# Patient Record
Sex: Male | Born: 1950 | Race: Black or African American | Hispanic: No | Marital: Married | Smoking: Former smoker
Health system: Southern US, Community
[De-identification: ages and names within clinical notes are randomized; demographics above are authoritative.]

## PROBLEM LIST (undated history)

## (undated) DIAGNOSIS — I10 Essential (primary) hypertension: Secondary | ICD-10-CM

## (undated) DIAGNOSIS — C801 Malignant (primary) neoplasm, unspecified: Secondary | ICD-10-CM

## (undated) DIAGNOSIS — I509 Heart failure, unspecified: Secondary | ICD-10-CM

## (undated) DIAGNOSIS — E119 Type 2 diabetes mellitus without complications: Secondary | ICD-10-CM

## (undated) DIAGNOSIS — G4733 Obstructive sleep apnea (adult) (pediatric): Secondary | ICD-10-CM

## (undated) DIAGNOSIS — Z9989 Dependence on other enabling machines and devices: Secondary | ICD-10-CM

---

## 1999-07-17 ENCOUNTER — Ambulatory Visit (HOSPITAL_BASED_OUTPATIENT_CLINIC_OR_DEPARTMENT_OTHER): Admission: RE | Admit: 1999-07-17 | Discharge: 1999-07-17 | Payer: Self-pay

## 1999-11-02 ENCOUNTER — Ambulatory Visit (HOSPITAL_BASED_OUTPATIENT_CLINIC_OR_DEPARTMENT_OTHER): Admission: RE | Admit: 1999-11-02 | Discharge: 1999-11-02 | Payer: Self-pay | Admitting: *Deleted

## 2005-11-12 ENCOUNTER — Ambulatory Visit (HOSPITAL_COMMUNITY): Admission: RE | Admit: 2005-11-12 | Discharge: 2005-11-12 | Payer: Self-pay | Admitting: *Deleted

## 2014-01-23 ENCOUNTER — Other Ambulatory Visit: Payer: Self-pay | Admitting: Internal Medicine

## 2014-01-23 DIAGNOSIS — Z139 Encounter for screening, unspecified: Secondary | ICD-10-CM

## 2014-02-01 ENCOUNTER — Other Ambulatory Visit: Payer: Self-pay | Admitting: Internal Medicine

## 2014-02-01 DIAGNOSIS — R0989 Other specified symptoms and signs involving the circulatory and respiratory systems: Secondary | ICD-10-CM

## 2014-02-01 DIAGNOSIS — Z139 Encounter for screening, unspecified: Secondary | ICD-10-CM

## 2019-03-19 DIAGNOSIS — G4733 Obstructive sleep apnea (adult) (pediatric): Secondary | ICD-10-CM | POA: Diagnosis not present

## 2021-01-26 DIAGNOSIS — Z86010 Personal history of colonic polyps: Secondary | ICD-10-CM | POA: Diagnosis not present

## 2021-01-26 DIAGNOSIS — R634 Abnormal weight loss: Secondary | ICD-10-CM | POA: Diagnosis not present

## 2021-01-26 DIAGNOSIS — K5900 Constipation, unspecified: Secondary | ICD-10-CM | POA: Diagnosis not present

## 2021-02-04 DIAGNOSIS — R634 Abnormal weight loss: Secondary | ICD-10-CM | POA: Diagnosis not present

## 2021-02-04 DIAGNOSIS — R058 Other specified cough: Secondary | ICD-10-CM | POA: Diagnosis not present

## 2021-02-04 DIAGNOSIS — G4733 Obstructive sleep apnea (adult) (pediatric): Secondary | ICD-10-CM | POA: Diagnosis not present

## 2021-02-11 DIAGNOSIS — G4733 Obstructive sleep apnea (adult) (pediatric): Secondary | ICD-10-CM | POA: Diagnosis not present

## 2021-02-11 NOTE — Progress Notes (Unsigned)
° ° °  Patient referred by [name], DO for ***  Subjective:   Bryce Cain, male    DOB: 03/13/1950, 71 y.o.   MRN: [id number]  *** No chief complaint on file.   *** HPI  71 y.o. *** male with ***  *** No past medical history on file.  *** *** The histories are not reviewed yet. Please review them in the "History" navigator section and refresh this SmartLink.  *** Social History   Tobacco Use  Smoking Status Not on file  Smokeless Tobacco Not on file    Social History   Substance and Sexual Activity  Alcohol Use Not on file    *** No family history on file.  *** No current outpatient medications on file prior to visit.   No current facility-administered medications on file prior to visit.    Cardiovascular and other pertinent studies:  *** EKG ***/***/202***: ***  ***  *** Recent labs: 09/10/2020: Glucose 92, BUN/Cr 22/1.57. EGFR 50. Na/K 139/3.9. ***Rest of the CMP normal H/H 13.2/36.3. MCV 78.1. Platelets 162 ***HbA1C 5.7% Chol 153, TG 58, HDL 54, LDL 87 ***TSH ***normal   *** ROS      *** There were no vitals filed for this visit.   There is no height or weight on file to calculate BMI. There were no vitals filed for this visit.  *** Objective:   Physical Exam    ***     Assessment & Recommendations:   ***  ***  Thank you for referring the patient to [place]. Please feel free to contact with any questions.   Nigel Mormon, MD Pager: [phone] Office: [phone]

## 2021-02-14 ENCOUNTER — Emergency Department (HOSPITAL_COMMUNITY): Payer: Medicare PPO

## 2021-02-14 ENCOUNTER — Encounter (HOSPITAL_COMMUNITY): Payer: Self-pay | Admitting: Cardiology

## 2021-02-14 ENCOUNTER — Inpatient Hospital Stay (HOSPITAL_COMMUNITY)
Admission: EM | Admit: 2021-02-14 | Discharge: 2021-02-19 | DRG: 286 | Disposition: A | Discharge status: Home / Self Care | Payer: Medicare PPO | Attending: Cardiology | Admitting: Cardiology

## 2021-02-14 ENCOUNTER — Other Ambulatory Visit: Payer: Self-pay

## 2021-02-14 DIAGNOSIS — I472 Ventricular tachycardia, unspecified: Secondary | ICD-10-CM | POA: Diagnosis not present

## 2021-02-14 DIAGNOSIS — D696 Thrombocytopenia, unspecified: Secondary | ICD-10-CM | POA: Diagnosis present

## 2021-02-14 DIAGNOSIS — E876 Hypokalemia: Secondary | ICD-10-CM | POA: Diagnosis present

## 2021-02-14 DIAGNOSIS — D509 Iron deficiency anemia, unspecified: Secondary | ICD-10-CM | POA: Diagnosis present

## 2021-02-14 DIAGNOSIS — Z79899 Other long term (current) drug therapy: Secondary | ICD-10-CM | POA: Diagnosis not present

## 2021-02-14 DIAGNOSIS — Z20822 Contact with and (suspected) exposure to covid-19: Secondary | ICD-10-CM | POA: Diagnosis present

## 2021-02-14 DIAGNOSIS — Z8249 Family history of ischemic heart disease and other diseases of the circulatory system: Secondary | ICD-10-CM | POA: Diagnosis not present

## 2021-02-14 DIAGNOSIS — I110 Hypertensive heart disease with heart failure: Secondary | ICD-10-CM | POA: Diagnosis not present

## 2021-02-14 DIAGNOSIS — I5043 Acute on chronic combined systolic (congestive) and diastolic (congestive) heart failure: Secondary | ICD-10-CM | POA: Diagnosis not present

## 2021-02-14 DIAGNOSIS — R0600 Dyspnea, unspecified: Secondary | ICD-10-CM | POA: Diagnosis not present

## 2021-02-14 DIAGNOSIS — I130 Hypertensive heart and chronic kidney disease with heart failure and stage 1 through stage 4 chronic kidney disease, or unspecified chronic kidney disease: Principal | ICD-10-CM | POA: Diagnosis present

## 2021-02-14 DIAGNOSIS — I252 Old myocardial infarction: Secondary | ICD-10-CM | POA: Diagnosis not present

## 2021-02-14 DIAGNOSIS — I5021 Acute systolic (congestive) heart failure: Secondary | ICD-10-CM | POA: Diagnosis present

## 2021-02-14 DIAGNOSIS — E1122 Type 2 diabetes mellitus with diabetic chronic kidney disease: Secondary | ICD-10-CM | POA: Diagnosis present

## 2021-02-14 DIAGNOSIS — N1831 Chronic kidney disease, stage 3a: Secondary | ICD-10-CM | POA: Diagnosis not present

## 2021-02-14 DIAGNOSIS — Z7984 Long term (current) use of oral hypoglycemic drugs: Secondary | ICD-10-CM | POA: Diagnosis not present

## 2021-02-14 DIAGNOSIS — E785 Hyperlipidemia, unspecified: Secondary | ICD-10-CM | POA: Diagnosis not present

## 2021-02-14 DIAGNOSIS — R110 Nausea: Secondary | ICD-10-CM | POA: Diagnosis not present

## 2021-02-14 DIAGNOSIS — I10 Essential (primary) hypertension: Secondary | ICD-10-CM | POA: Diagnosis not present

## 2021-02-14 DIAGNOSIS — E669 Obesity, unspecified: Secondary | ICD-10-CM | POA: Diagnosis present

## 2021-02-14 DIAGNOSIS — J811 Chronic pulmonary edema: Secondary | ICD-10-CM | POA: Diagnosis not present

## 2021-02-14 DIAGNOSIS — D649 Anemia, unspecified: Secondary | ICD-10-CM | POA: Diagnosis not present

## 2021-02-14 DIAGNOSIS — I420 Dilated cardiomyopathy: Secondary | ICD-10-CM | POA: Diagnosis not present

## 2021-02-14 DIAGNOSIS — I447 Left bundle-branch block, unspecified: Secondary | ICD-10-CM | POA: Diagnosis not present

## 2021-02-14 DIAGNOSIS — Z7982 Long term (current) use of aspirin: Secondary | ICD-10-CM | POA: Diagnosis not present

## 2021-02-14 DIAGNOSIS — I428 Other cardiomyopathies: Secondary | ICD-10-CM | POA: Diagnosis not present

## 2021-02-14 DIAGNOSIS — J9811 Atelectasis: Secondary | ICD-10-CM | POA: Diagnosis not present

## 2021-02-14 DIAGNOSIS — I2510 Atherosclerotic heart disease of native coronary artery without angina pectoris: Secondary | ICD-10-CM | POA: Diagnosis present

## 2021-02-14 DIAGNOSIS — J90 Pleural effusion, not elsewhere classified: Secondary | ICD-10-CM | POA: Diagnosis not present

## 2021-02-14 DIAGNOSIS — G4733 Obstructive sleep apnea (adult) (pediatric): Secondary | ICD-10-CM | POA: Diagnosis present

## 2021-02-14 DIAGNOSIS — I509 Heart failure, unspecified: Secondary | ICD-10-CM | POA: Diagnosis not present

## 2021-02-14 DIAGNOSIS — Z6834 Body mass index (BMI) 34.0-34.9, adult: Secondary | ICD-10-CM | POA: Diagnosis not present

## 2021-02-14 DIAGNOSIS — I5031 Acute diastolic (congestive) heart failure: Secondary | ICD-10-CM | POA: Diagnosis not present

## 2021-02-14 DIAGNOSIS — I5023 Acute on chronic systolic (congestive) heart failure: Secondary | ICD-10-CM | POA: Diagnosis not present

## 2021-02-14 HISTORY — DX: Type 2 diabetes mellitus without complications: E11.9

## 2021-02-14 HISTORY — DX: Obstructive sleep apnea (adult) (pediatric): Z99.89

## 2021-02-14 HISTORY — DX: Obstructive sleep apnea (adult) (pediatric): G47.33

## 2021-02-14 LAB — BASIC METABOLIC PANEL
Anion gap: 13 (ref 5–15)
BUN: 17 mg/dL (ref 8–23)
CO2: 22 mmol/L (ref 22–32)
Calcium: 10.1 mg/dL (ref 8.9–10.3)
Chloride: 105 mmol/L (ref 98–111)
Creatinine, Ser: 1.49 mg/dL — ABNORMAL HIGH (ref 0.61–1.24)
GFR, Estimated: 50 mL/min — ABNORMAL LOW (ref >60)
Glucose, Bld: 188 mg/dL — ABNORMAL HIGH (ref 70–99)
Potassium: 3.8 mmol/L (ref 3.5–5.1)
Sodium: 140 mmol/L (ref 135–145)

## 2021-02-14 LAB — RESP PANEL BY RT-PCR (FLU A&B, COVID) ARPGX2
Influenza A by PCR: NEGATIVE
Influenza B by PCR: NEGATIVE
SARS Coronavirus 2 by RT PCR: NEGATIVE

## 2021-02-14 LAB — CBC
HCT: 37.1 % — ABNORMAL LOW (ref 39.0–52.0)
Hemoglobin: 13.1 g/dL (ref 13.0–17.0)
MCH: 28.4 pg (ref 26.0–34.0)
MCHC: 35.3 g/dL (ref 30.0–36.0)
MCV: 80.5 fL (ref 80.0–100.0)
Platelets: 155 10*3/uL (ref 150–400)
RBC: 4.61 MIL/uL (ref 4.22–5.81)
RDW: 15.9 % — ABNORMAL HIGH (ref 11.5–15.5)
WBC: 7.5 10*3/uL (ref 4.0–10.5)
nRBC: 0 % (ref 0.0–0.2)

## 2021-02-14 LAB — PROTIME-INR
INR: 1.3 — ABNORMAL HIGH (ref 0.8–1.2)
Prothrombin Time: 15.9 seconds — ABNORMAL HIGH (ref 11.4–15.2)

## 2021-02-14 LAB — TROPONIN I (HIGH SENSITIVITY)
Troponin I (High Sensitivity): 36 ng/L — ABNORMAL HIGH (ref <18)
Troponin I (High Sensitivity): 121 ng/L (ref <18)

## 2021-02-14 LAB — TSH: TSH: 0.662 u[IU]/mL (ref 0.350–4.500)

## 2021-02-14 LAB — HEMOGLOBIN A1C
Hgb A1c MFr Bld: 5.4 % (ref 4.8–5.6)
Mean Plasma Glucose: 108.28 mg/dL

## 2021-02-14 LAB — GLUCOSE, CAPILLARY: Glucose-Capillary: 89 mg/dL (ref 70–99)

## 2021-02-14 LAB — MAGNESIUM
Magnesium: 1.7 mg/dL (ref 1.7–2.4)
Magnesium: 1.6 mg/dL — ABNORMAL LOW (ref 1.7–2.4)

## 2021-02-14 LAB — BRAIN NATRIURETIC PEPTIDE: B Natriuretic Peptide: 707 pg/mL — ABNORMAL HIGH (ref 0.0–100.0)

## 2021-02-14 LAB — T4, FREE: Free T4: 1.15 ng/dL — ABNORMAL HIGH (ref 0.61–1.12)

## 2021-02-14 IMAGING — DX DG CHEST 1V PORT
1 series · 1 of 1 positions shown · non-contrast
Comparison: None.

CLINICAL DATA: Dyspnea.

EXAM:
PORTABLE CHEST 1 VIEW

[chest ap]
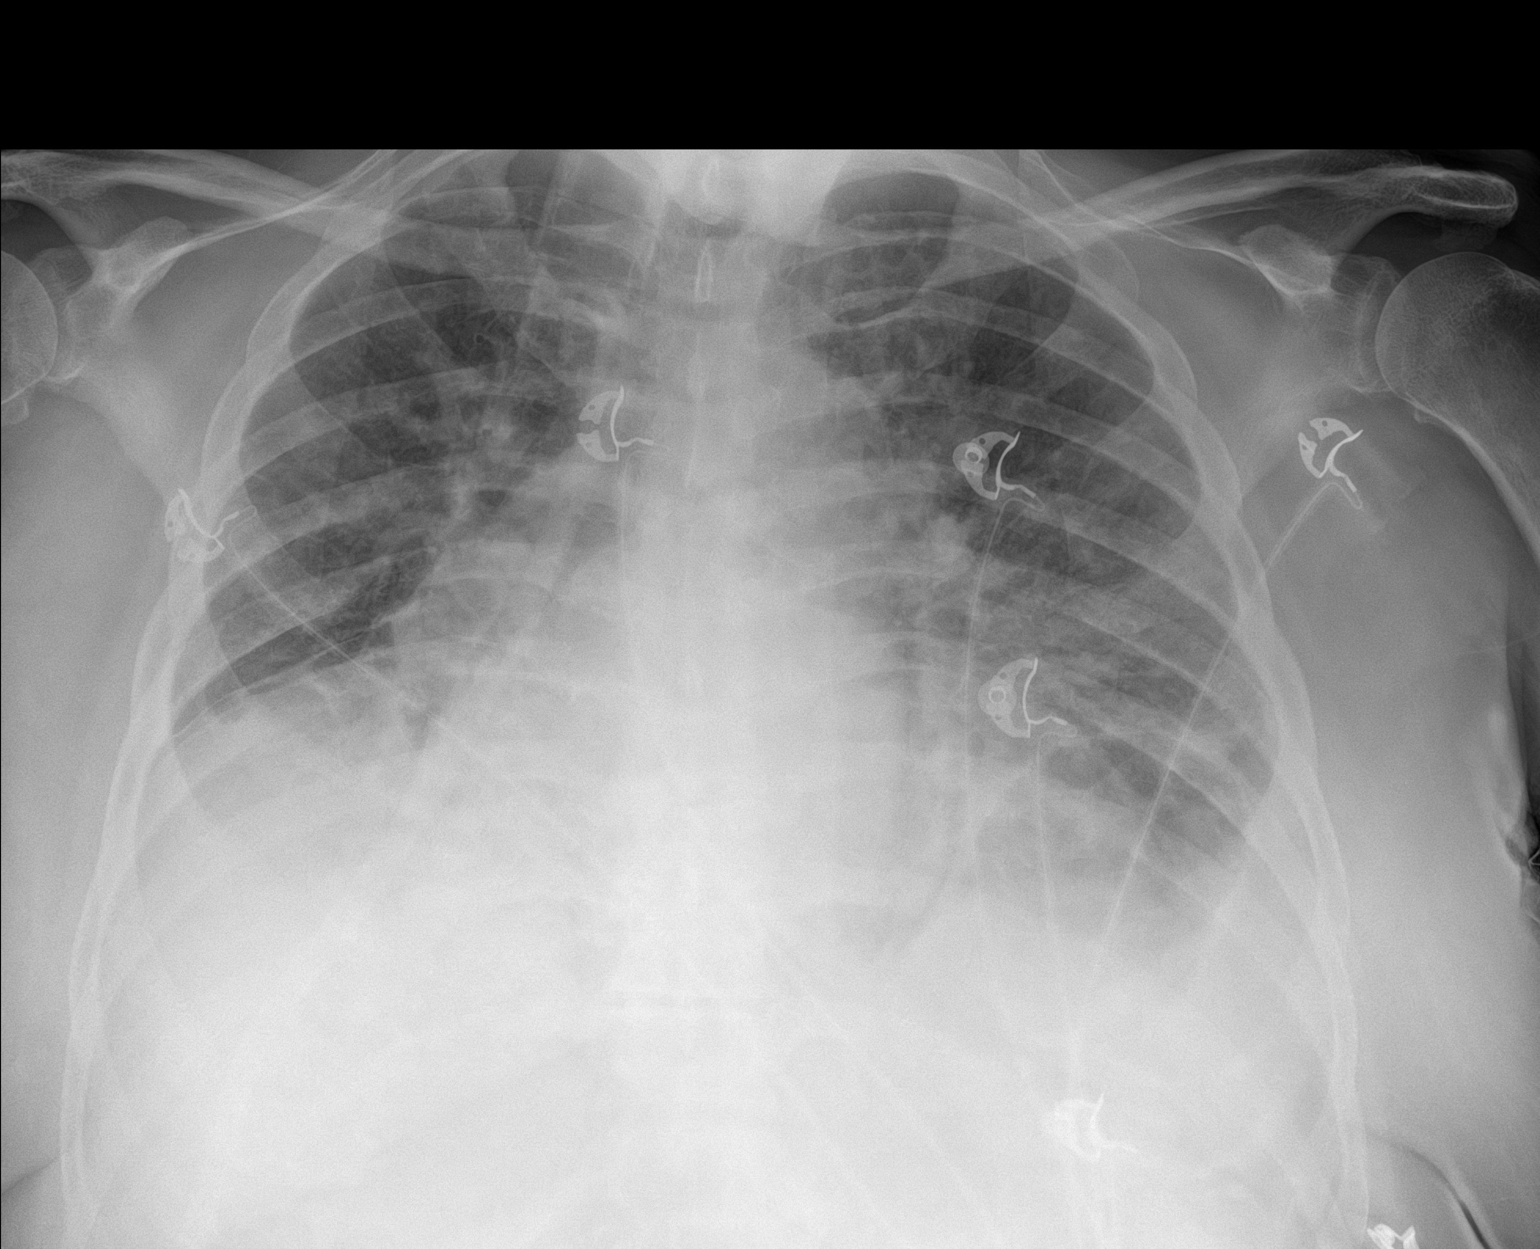

[1 of 1 positions shown; findings below may reference images not displayed]

FINDINGS: Normal cardiomediastinal contours. Lung volumes are low. Bilateral
pleural effusions are identified. There is diffuse pulmonary
vascular congestion. Atelectasis noted in the left base.
IMPRESSION: 1. Low lung volumes with mild CHF.
2. Left base atelectasis.

## 2021-02-14 MED ORDER — ASPIRIN 300 MG RE SUPP
300.0000 mg | RECTAL | Status: AC
  Filled 2021-02-14: qty 1

## 2021-02-14 MED ORDER — ASPIRIN EC 81 MG PO TBEC: 81.0000 mg | DELAYED_RELEASE_TABLET | Freq: Every day | ORAL | Status: DC

## 2021-02-14 MED ORDER — NITROGLYCERIN IN D5W 200-5 MCG/ML-% IV SOLN
5.0000 ug/min | INTRAVENOUS | Status: DC
  Administered 2021-02-14: 10 ug/min via INTRAVENOUS
  Filled 2021-02-14: qty 250

## 2021-02-14 MED ORDER — RISAQUAD PO CAPS
1.0000 | ORAL_CAPSULE | Freq: Every day | ORAL | Status: DC
Start: 2021-02-14 — End: 2021-02-20
  Administered 2021-02-14 – 2021-02-19 (×6): 1 via ORAL
  Filled 2021-02-14 (×6): qty 1

## 2021-02-14 MED ORDER — INSULIN ASPART 100 UNIT/ML IJ SOLN: 0.0000 [IU] | Freq: Every day | INTRAMUSCULAR | Status: DC

## 2021-02-14 MED ORDER — FUROSEMIDE 10 MG/ML IJ SOLN
40.0000 mg | Freq: Two times a day (BID) | INTRAMUSCULAR | Status: AC
  Administered 2021-02-14 – 2021-02-15 (×3): 40 mg via INTRAVENOUS
  Filled 2021-02-14 (×3): qty 4

## 2021-02-14 MED ORDER — NITROGLYCERIN 0.4 MG SL SUBL: 0.4000 mg | SUBLINGUAL_TABLET | SUBLINGUAL | Status: DC | PRN

## 2021-02-14 MED ORDER — NITROGLYCERIN IN D5W 200-5 MCG/ML-% IV SOLN: 0.0000 ug/min | INTRAVENOUS | Status: DC

## 2021-02-14 MED ORDER — ACETAMINOPHEN 325 MG PO TABS: 650.0000 mg | ORAL_TABLET | ORAL | Status: DC | PRN

## 2021-02-14 MED ORDER — ASPIRIN 81 MG PO CHEW
324.0000 mg | CHEWABLE_TABLET | ORAL | Status: AC
  Administered 2021-02-14: 324 mg via ORAL
  Filled 2021-02-14: qty 4

## 2021-02-14 MED ORDER — HEPARIN (PORCINE) 25000 UT/250ML-% IV SOLN
1250.0000 [IU]/h | INTRAVENOUS | Status: DC
  Administered 2021-02-14 – 2021-02-15 (×2): 1200 [IU]/h via INTRAVENOUS
  Administered 2021-02-16: 1250 [IU]/h via INTRAVENOUS
  Filled 2021-02-14 (×3): qty 250

## 2021-02-14 MED ORDER — POTASSIUM CHLORIDE CRYS ER 20 MEQ PO TBCR
20.0000 meq | EXTENDED_RELEASE_TABLET | Freq: Once | ORAL | Status: AC
  Administered 2021-02-14: 20 meq via ORAL
  Filled 2021-02-14: qty 1

## 2021-02-14 MED ORDER — PRAVASTATIN SODIUM 10 MG PO TABS
10.0000 mg | ORAL_TABLET | Freq: Every day | ORAL | Status: DC
  Administered 2021-02-14 – 2021-02-15 (×2): 10 mg via ORAL
  Filled 2021-02-14 (×2): qty 1

## 2021-02-14 MED ORDER — OMEGA-3-ACID ETHYL ESTERS 1 G PO CAPS
1.0000 g | ORAL_CAPSULE | Freq: Two times a day (BID) | ORAL | Status: DC
  Administered 2021-02-14 – 2021-02-19 (×10): 1 g via ORAL
  Filled 2021-02-14 (×10): qty 1

## 2021-02-14 MED ORDER — ASPIRIN EC 81 MG PO TBEC
81.0000 mg | DELAYED_RELEASE_TABLET | Freq: Every day | ORAL | Status: DC
  Administered 2021-02-15 – 2021-02-19 (×4): 81 mg via ORAL
  Filled 2021-02-14 (×4): qty 1

## 2021-02-14 MED ORDER — ADULT MULTIVITAMIN W/MINERALS CH
1.0000 | ORAL_TABLET | Freq: Every day | ORAL | Status: DC
  Administered 2021-02-14 – 2021-02-19 (×6): 1 via ORAL
  Filled 2021-02-14 (×6): qty 1

## 2021-02-14 MED ORDER — DOCUSATE SODIUM 100 MG PO CAPS
100.0000 mg | ORAL_CAPSULE | Freq: Two times a day (BID) | ORAL | Status: DC
  Administered 2021-02-15 – 2021-02-18 (×3): 100 mg via ORAL
  Filled 2021-02-14 (×10): qty 1

## 2021-02-14 MED ORDER — HEPARIN BOLUS VIA INFUSION
4000.0000 [IU] | Freq: Once | INTRAVENOUS | Status: AC
  Administered 2021-02-14: 4000 [IU] via INTRAVENOUS
  Filled 2021-02-14: qty 4000

## 2021-02-14 MED ORDER — FUROSEMIDE 10 MG/ML IJ SOLN
40.0000 mg | Freq: Once | INTRAMUSCULAR | Status: AC
  Administered 2021-02-14: 40 mg via INTRAVENOUS
  Filled 2021-02-14: qty 4

## 2021-02-14 MED ORDER — INSULIN ASPART 100 UNIT/ML IJ SOLN
0.0000 [IU] | Freq: Three times a day (TID) | INTRAMUSCULAR | Status: DC
  Administered 2021-02-17: 1 [IU] via SUBCUTANEOUS

## 2021-02-14 MED ORDER — ONDANSETRON HCL 4 MG/2ML IJ SOLN: 4.0000 mg | Freq: Four times a day (QID) | INTRAMUSCULAR | Status: DC | PRN

## 2021-02-14 NOTE — ED Provider Notes (Signed)
[facility] EMERGENCY DEPARTMENT Provider Note   CSN: [id number] Arrival date & time: 02/14/21  0900     History  Chief Complaint  Patient presents with   Shortness of Breath    Bryce Cain is a 71 y.o. male.   Shortness of Breath Associated symptoms: no chest pain    Patient presents to the ED for evaluation of shortness of breath.  Patient has been having some issues recently with increasing shortness of breath.  He has seen his doctors and is getting referrals to pulmonary and cardiology doctors.  Patient does have a history of obstructive sleep apnea and wears CPAP.  Patient began having worsening symptoms.  Symptoms are severe and his breathing is very labored.  Patient feels worse when he is lying flat and wants to stand up.  Family stated he looked like he was going to faint earlier but he has not had any syncopal episodes.  Is not having chest pain.  No fevers.  Home Medications Prior to Admission medications   Not on File      Allergies    Patient has no allergy information on record.    Review of Systems   Review of Systems  Respiratory:  Positive for shortness of breath.   Cardiovascular:  Negative for chest pain.   Physical Exam Updated Vital Signs BP 90/65    Pulse 79    Temp 98.2 F (36.8 C) (Axillary)    Resp 17    Ht 1.778 m (5\' 10" )    Wt 107.5 kg    SpO2 100%    BMI 34.01 kg/m  Physical Exam Vitals and nursing note reviewed.  Constitutional:      General: He is in acute distress.     Appearance: He is well-developed. He is ill-appearing.  HENT:     Head: Normocephalic and atraumatic.     Right Ear: External ear normal.     Left Ear: External ear normal.  Eyes:     General: No scleral icterus.       Right eye: No discharge.        Left eye: No discharge.     Conjunctiva/sclera: Conjunctivae normal.  Neck:     Trachea: No tracheal deviation.  Cardiovascular:     Rate and Rhythm: Normal rate and regular rhythm.  Pulmonary:      Effort: Tachypnea and accessory muscle usage present. No respiratory distress.     Breath sounds: No stridor. Rales present. No wheezing.  Abdominal:     General: Bowel sounds are normal. There is no distension.     Palpations: Abdomen is soft.     Tenderness: There is no abdominal tenderness. There is no guarding or rebound.  Musculoskeletal:        General: No tenderness or deformity.     Cervical back: Neck supple.     Right lower leg: No edema.     Left lower leg: No edema.  Skin:    General: Skin is warm and dry.     Findings: No rash.  Neurological:     General: No focal deficit present.     Mental Status: He is alert.     Cranial Nerves: No cranial nerve deficit (no facial droop, extraocular movements intact, no slurred speech).     Sensory: No sensory deficit.     Motor: No abnormal muscle tone or seizure activity.     Coordination: Coordination normal.  Psychiatric:  Mood and Affect: Mood normal.    ED Results / Procedures / Treatments   Labs (all labs ordered are listed, but only abnormal results are displayed) Labs Reviewed  BASIC METABOLIC PANEL - Abnormal; Notable for the following components:      Result Value   Glucose, Bld 188 (*)    Creatinine, Ser 1.49 (*)    GFR, Estimated 50 (*)    All other components within normal limits  BRAIN NATRIURETIC PEPTIDE - Abnormal; Notable for the following components:   B Natriuretic Peptide 707.0 (*)    All other components within normal limits  CBC - Abnormal; Notable for the following components:   HCT 37.1 (*)    RDW 15.9 (*)    All other components within normal limits  TROPONIN I (HIGH SENSITIVITY) - Abnormal; Notable for the following components:   Troponin I (High Sensitivity) 36 (*)    All other components within normal limits  RESP PANEL BY RT-PCR (FLU A&B, COVID) ARPGX2  MAGNESIUM  TROPONIN I (HIGH SENSITIVITY)    EKG EKG Interpretation  Date/Time:  Saturday February 14 2021 09:30:45  EST Ventricular Rate:  129 PR Interval:  64 QRS Duration: 181 QT Interval:  424 QTC Calculation: 622 R Axis:   19 Text Interpretation: Sinus or ectopic atrial tachycardia IVCD, consider atypical LBBB No previous tracing Confirmed by Dorie Rank [id number] on 02/14/2021 9:49:14 AM  Radiology DG Chest Portable 1 View  Result Date: 02/14/2021 CLINICAL DATA:  Dyspnea. EXAM: PORTABLE CHEST 1 VIEW COMPARISON:  None. FINDINGS: Normal cardiomediastinal contours. Lung volumes are low. Bilateral pleural effusions are identified. There is diffuse pulmonary vascular congestion. Atelectasis noted in the left base. IMPRESSION: 1. Low lung volumes with mild CHF. 2. Left base atelectasis. Electronically Signed   By: Kerby Moors M.D.   On: 02/14/2021 10:51    Procedures .Critical Care Performed by: Dorie Rank, MD Authorized by: Dorie Rank, MD   Critical care provider statement:    Critical care time (minutes):  35   Critical care was time spent personally by me on the following activities:  Development of treatment plan with patient or surrogate, discussions with consultants, evaluation of patient's response to treatment, examination of patient, ordering and review of laboratory studies, ordering and review of radiographic studies, ordering and performing treatments and interventions, pulse oximetry, re-evaluation of patient's condition and review of old charts    Medications Ordered in ED Medications  nitroGLYCERIN 50 mg in dextrose 5 % 250 mL (0.2 mg/mL) infusion (10 mcg/min Intravenous New Bag/Given 02/14/21 0928)  furosemide (LASIX) injection 40 mg (has no administration in time range)  potassium chloride SA (KLOR-CON M) CR tablet 20 mEq (has no administration in time range)    ED Course/ Medical Decision Making/ A&P Clinical Course as of 02/14/21 1151  Sat Feb 14, 2021  0933 Patient started on BiPAP and now is feeling significantly better.  Work of breathing decreased.  Patient does not appear  distressed at this time [JK]  1000 DG Chest Portable 1 View Preliminary review of chest x-ray shows cardiomegaly and bilateral pulmonary edema [JK]  0071 Basic metabolic panel(!) Renal function shows slight increase in creatinine at 1.49 [JK]  1013 Troponin I (High Sensitivity)(!) Troponin slightly elevated at 36 [JK]  1110 Chest x-ray shows evidence of pulmonary edema [JK]  1110 Brain natriuretic peptide (order ONLY if patient c/o SOB)(!) BNP elevated [JK]  1113 Continued improvement in breathing.  Still on bipap but may be able to taper off  as he responds to diuresis [JK]  1142 Case discussed with Dr. Johney Frame.  She agrees with admission.  Cardiology will see [JK]    Clinical Course User Index [JK] Dorie Rank, MD                           Medical Decision Making Amount and/or Complexity of Data Reviewed Independent Historian: caregiver Labs: ordered. Decision-making details documented in ED Course. Radiology: ordered. Decision-making details documented in ED Course.  Risk Prescription drug management. Drug therapy requiring intensive monitoring for toxicity. Decision regarding hospitalization.   Patient presented to the ED for evaluation of acute shortness of breath.  On arrival patient appeared to be in significant distress.  He was not hypoxic but he was tachypneic and had increased work of breathing.,  Pneumonia, PE, CHF exacerbation all a significant concern.  ED work-up does not show any signs of leukocytosis.  He is not febrile.  COVID and flu are negative.  Patient's laboratory test did show an elevated troponin as well as a BNP.  Chest x-ray is suggestive of an acute CHF exacerbation.  Patient was treated with a BiPAP IV nitroglycerin and Lasix.  He has had significant improvement of his symptoms.  Patient however will require admission to the hospital for further treatment evaluation.  I will consult with the cardiology service        Final Clinical Impression(s) / ED  Diagnoses Final diagnoses:  Acute on chronic congestive heart failure, unspecified heart failure type [facility])     Dorie Rank, MD 02/14/21 1152

## 2021-02-14 NOTE — ED Notes (Signed)
Pt weaned from bi-pap down to 2L [place].  Tolerating well.

## 2021-02-14 NOTE — Progress Notes (Signed)
Pt placed on CPAP machine and setting made for pt's comfort. Pt tolerating well without any complaints.

## 2021-02-14 NOTE — Plan of Care (Signed)
  Problem: Health Behavior/Discharge Planning: Goal: Ability to manage health-related needs will improve Outcome: Progressing   Problem: Education: Goal: Knowledge of General Education information will improve Description: Including pain rating scale, medication(s)/side effects and non-pharmacologic comfort measures Outcome: Progressing   

## 2021-02-14 NOTE — ED Triage Notes (Signed)
Pt c/o SOB starting at 0200 this AM, endorses relief with rest, worse with exertion. Pt states he was resting in bed when SOB started, unable to sleep due to SOB. Pt wears CPAP at night, was wearing CPAP when SOB began. Denies CP.

## 2021-02-14 NOTE — H&P (Addendum)
Cardiology History and Physical   Patient ID: Bryce Cain MRN: [id number]; DOB: Sep 04, 1950  Admit date: 02/14/2021 Date of Consult: 02/14/2021  PCP:  [facility] [place] Prov[facility] Cardiologist:  None       CC: SOB Patient Profile:   Bryce Cain is a 71 y.o. male with a hx of OSA on cpap now admitted with SOB who is being seen 02/14/2021 for the evaluation of elevated troponin and acute CHF at the request of Dr. Tomi Bamberger.  History of Present Illness:   Mr. Scheff with prior hx of OSA and CPAP and recently with increased SOB, PCP was referring to cards and pulmonary.  He saw his sleep apnea MD and his device is not working well but pt has had on going SOB.  Over last 3 weeks until unable to lie flat.  No lower ext edema + bloating and fullness in abd.  No chest pain.  He can walk around house without dyspnea but if walks outside home then DOE.     Pt's brother is oncologist and has helped him get script for lasix but pt has not yet started.  Pt's breathing had deteriorated and he came to ER , could not lie flat.  No chest pain.   Na 140, K+ 3.8, glucose 188 BUN 17, Cr 1.49  BNP 707 hs troponin 36  WBC 7.5 Hgb 13.1 plts155 Covid neg   Pt placed on Bipap  PCXR  FINDINGS: Normal cardiomediastinal contours. Lung volumes are low. Bilateral pleural effusions are identified. There is diffuse pulmonary vascular congestion. Atelectasis noted in the left base.   IMPRESSION: 1. Low lung volumes with mild CHF. 2. Left base atelectasis.  EKG:  The EKG was personally reviewed and demonstrates:  atrial tach with LBBB vs ST  Telemetry:  Telemetry was personally reviewed and demonstrates:  ST  BP 664/40 to 98 systolic  placed on NTG which helped and has lasix ordered but not given.  Past Medical History:  Diagnosis Date   DM (diabetes mellitus), type 2 ([place])    OSA on CPAP     History reviewed. No pertinent surgical history.   Home Medications:  Prior to  Admission medications   Medication Sig Start Date End Date Taking? Authorizing Provider  acidophilus (RISAQUAD) CAPS capsule Take 1 capsule by mouth daily.   Yes [provider]  amLODipine (NORVASC) 10 MG tablet Take 10 mg by mouth daily. 01/14/21  Yes [provider]  aspirin EC 81 MG tablet Take 81 mg by mouth daily. Swallow whole.   Yes [provider]  docusate sodium (COLACE) 100 MG capsule Take 100 mg by mouth 2 (two) times daily.   Yes [provider]  JANUMET 50-500 MG tablet Take 1 tablet by mouth 2 (two) times daily. 01/15/21  Yes [provider]  Multiple Vitamins-Minerals (MULTIVITAMIN WITH MINERALS) tablet Take 1 tablet by mouth daily.   Yes [provider]  Omega-3 Fatty Acids (FISH OIL) 1000 MG CAPS Take 1,000 mg by mouth daily.   Yes [provider]  pravastatin (PRAVACHOL) 10 MG tablet Take 10 mg by mouth daily. 01/14/21  Yes [provider]  valsartan-hydrochlorothiazide (DIOVAN-HCT) 320-12.5 MG tablet Take 1 tablet by mouth daily. 01/13/21  Yes [provider]    Inpatient Medications: Scheduled Meds:  furosemide  40 mg Intravenous Once   potassium chloride  20 mEq Oral Once   Continuous Infusions:  nitroGLYCERIN 10 mcg/min (02/14/21 0928)   PRN  Meds:   Allergies:   No Known Allergies  Social History:   Social History   Socioeconomic History   Marital status: Married    Spouse name: Not on file   Number of children: Not on file   Years of education: Not on file   Highest education level: Not on file  Occupational History   Not on file  Tobacco Use   Smoking status: Not on file   Smokeless tobacco: Not on file  Substance and Sexual Activity   Alcohol use: Not on file   Drug use: Not on file   Sexual activity: Not on file  Other Topics Concern   Not on file  Social History Narrative   Not on file   Social Determinants of Health   Financial Resource Strain: Not on file   Food Insecurity: Not on file  Transportation Needs: Not on file  Physical Activity: Not on file  Stress: Not on file  Social Connections: Not on file  Intimate Partner Violence: Not on file    Family History:    Family History  Problem Relation Age of Onset   Heart attack Father      ROS:  Please see the history of present illness.  General:no colds or fevers, no weight changes Skin:no rashes or ulcers HEENT:no blurred vision, no congestion CV:see HPI PUL:see HPI GI:no diarrhea constipation or melena, no indigestion GU:no hematuria, no dysuria MS:no joint pain, no claudication Neuro:no syncope, no lightheadedness Endo:+ diabetes, no thyroid disease  All other ROS reviewed and negative.     Physical Exam/Data:   Vitals:   02/14/21 1145 02/14/21 1200 02/14/21 1230 02/14/21 1300  BP: 92/63 100/74    Pulse: 67 81 76 72  Resp: (!) 21 (!) 21 13 13   Temp:      TempSrc:      SpO2: 100% 100% 100% 100%  Weight:      Height:       No intake or output data in the 24 hours ending 02/14/21 1318 Last 3 Weights 02/14/2021  Weight (lbs) 237 lb  Weight (kg) 107.502 kg     Body mass index is 34.01 kg/m.  General:  Well nourished, well developed, wearing BiPap and some dyspnea HEENT: normal Neck: no JVD Vascular: No carotid bruits; Distal pulses 2+ bilaterally Cardiac:  normal S1, S2; RRR; no murmur gallup rub or click Lungs:  rales  to auscultation bilaterally, no wheezing, rhonchi  Abd: soft, nontender, no hepatomegaly  Ext: no edema Musculoskeletal:  No deformities, BUE and BLE strength normal and equal Skin: warm and dry  Neuro:  CNs 2-12 intact, no focal abnormalities noted Psych:  Normal affect    Relevant CV Studies: none  Laboratory Data:  High Sensitivity Troponin:   Recent Labs  Lab 02/14/21 0915  TROPONINIHS 36*     Chemistry Recent Labs  Lab 02/14/21 0915  NA 140  K 3.8  CL 105  CO2 22  GLUCOSE 188*  BUN 17  CREATININE 1.49*  CALCIUM 10.1   MG 1.7  GFRNONAA 50*  ANIONGAP 13    No results for input(s): PROT, ALBUMIN, AST, ALT, ALKPHOS, BILITOT in the last 168 hours. Lipids No results for input(s): CHOL, TRIG, HDL, LABVLDL, LDLCALC, CHOLHDL in the last 168 hours.  Hematology Recent Labs  Lab 02/14/21 0915  WBC 7.5  RBC 4.61  HGB 13.1  HCT 37.1*  MCV 80.5  MCH 28.4  MCHC 35.3  RDW 15.9*  PLT 155   Thyroid  No results for input(s): TSH, FREET4 in the last 168 hours.  BNP Recent Labs  Lab 02/14/21 0915  BNP 707.0*    DDimer No results for input(s): DDIMER in the last 168 hours.   Radiology/Studies:  DG Chest Portable 1 View  Result Date: 02/14/2021 CLINICAL DATA:  Dyspnea. EXAM: PORTABLE CHEST 1 VIEW COMPARISON:  None. FINDINGS: Normal cardiomediastinal contours. Lung volumes are low. Bilateral pleural effusions are identified. There is diffuse pulmonary vascular congestion. Atelectasis noted in the left base. IMPRESSION: 1. Low lung volumes with mild CHF. 2. Left base atelectasis. Electronically Signed   By: Kerby Moors M.D.   On: 02/14/2021 10:51     Assessment and Plan:   Acute CHF began 3 weeks ago, hs troponin 36 but second one not done - yet.  Will repeat.  No angina, plan to diuresis. Echo and keep K+ 4.0 and mg+ >2.0.  IV NTG has helped continue for now.continue ASA DM-2 hold janumet  HTN on diovan HCT hold HCT and with lower BP hold ARB HLD on pravastatin check lipids.    Risk Assessment/Risk Scores:       New York Heart Association (NYHA) Functional Class NYHA Class III     For questions or updates, please contact [facility] Please consult [email or web address] for contact info under    Signed, Cecilie Kicks, NP  02/14/2021 1:18 PM  History and all data above reviewed.  Patient examined.  I agree with the findings as above.  The patient has had about 3 months of increasing shortness of breath.  He has been quietly and during this.  However, he has been getting symptoms more consistent with  PND and orthopnea.  He is been having a nonproductive cough but and also describes some "bubbles to come up".  He has not been having any fevers or chills.  He has not been having any chest pressure, neck or arm discomfort.  He has been told he had a left bundle branch block as he recalls but he has not been found previously to have vascular disease or heart disease.  He recently was given a prescription for Lasix which she did not start on the presumptive thought that he had heart failure.  Chest x-ray by his report apparently demonstrated some edema.  He came in to the hospital today because this morning he was more acutely short of breath and his wife convinced him to come after talking to his brother who is an oncologist.  BNP is mildly elevated.  Troponin was elevated but nondiagnostic.  Chest x-ray had some pulmonary edema.  He feels better with BiPAP and is now diuresing.  The patient exam reveals COR: Regular rate and rhythm,  Lungs: Decreased breath sounds with bilateral crackles,  Abd: Positive bowel sounds normal in frequency and pitch, Ext no edema.  All available labs, radiology testing, previous records reviewed. Agree with documented assessment and plan.  Pulmonary edema: The patient has acute heart failure and we will check an echocardiogram today.  I suspect some LV dysfunction.  We will continue IV diuresis.  He was put on IV nitroglycerin for his pulmonary edema and we can continue low-dose of this.  Meds will be titrated for afterload reduction and goal-directed medical therapy.  He will need a right and left heart catheterization this admission.  We will continue to cycle enzymes.  Jeneen Rinks Helio Lack  3:03 PM  02/14/2021

## 2021-02-14 NOTE — Progress Notes (Signed)
ANTICOAGULATION CONSULT NOTE - Initial Consult  Pharmacy Consult for Heparin Indication: chest pain/ACS  No Known Allergies  Patient Measurements: Height: 5\' 10"  (177.8 cm) Weight: 107.5 kg (237 lb) IBW/kg (Calculated) : 73 Heparin Dosing Weight: 96.1 kg  Vital Signs: Temp: 98.2 F (36.8 C) (02/11 0935) Temp Source: Axillary (02/11 0935) BP: 132/93 (02/11 1430) Pulse Rate: 93 (02/11 1430)  Labs: Recent Labs    02/14/21 0915 02/14/21 1122  HGB 13.1  --   HCT 37.1*  --   PLT 155  --   CREATININE 1.49*  --   TROPONINIHS 36* 99*    Estimated Creatinine Clearance: 55 mL/min (A) (by C-G formula based on SCr of 1.49 mg/dL (H)).   Medical History: Past Medical History:  Diagnosis Date   DM (diabetes mellitus), type 2 ([place])    OSA on CPAP     Medications:  (Not in a hospital admission)  Scheduled:   furosemide  40 mg Intravenous Q12H   potassium chloride  20 mEq Oral Once   Infusions:   nitroGLYCERIN 10 mcg/min (02/14/21 0928)   PRN:   Assessment: 60 yom with a history of OSA and CPAP. Patient is presenting with SOB. Heparin per pharmacy consult placed for chest pain/ACS.  Patient is not on anticoagulation prior to arrival.  Hgb 13.1; plt 155  Goal of Therapy:  Heparin level 0.3-0.7 units/ml Monitor platelets by anticoagulation protocol: Yes   Plan:  Give 4000 units bolus x 1 Start heparin infusion at 1200 units/hr Check anti-Xa level in 8 hours and daily while on heparin Continue to monitor H&H and platelets  Lorelei Pont, PharmD, BCPS 02/14/2021 3:03 PM ED Clinical Pharmacist -  [phone]

## 2021-02-15 ENCOUNTER — Inpatient Hospital Stay (HOSPITAL_COMMUNITY): Payer: Medicare PPO

## 2021-02-15 DIAGNOSIS — N1831 Chronic kidney disease, stage 3a: Secondary | ICD-10-CM | POA: Diagnosis not present

## 2021-02-15 DIAGNOSIS — I5021 Acute systolic (congestive) heart failure: Secondary | ICD-10-CM

## 2021-02-15 DIAGNOSIS — I5031 Acute diastolic (congestive) heart failure: Secondary | ICD-10-CM

## 2021-02-15 DIAGNOSIS — I509 Heart failure, unspecified: Secondary | ICD-10-CM | POA: Diagnosis not present

## 2021-02-15 DIAGNOSIS — D649 Anemia, unspecified: Secondary | ICD-10-CM

## 2021-02-15 LAB — LIPID PANEL
Cholesterol: 137 mg/dL (ref 0–200)
HDL: 55 mg/dL (ref >40)
LDL Cholesterol: 74 mg/dL (ref 0–99)
Total CHOL/HDL Ratio: 2.5 RATIO
Triglycerides: 41 mg/dL (ref <150)
VLDL: 8 mg/dL (ref 0–40)

## 2021-02-15 LAB — GLUCOSE, CAPILLARY
Glucose-Capillary: 106 mg/dL — ABNORMAL HIGH (ref 70–99)
Glucose-Capillary: 135 mg/dL — ABNORMAL HIGH (ref 70–99)
Glucose-Capillary: 121 mg/dL — ABNORMAL HIGH (ref 70–99)

## 2021-02-15 LAB — ECHOCARDIOGRAM COMPLETE
Height: 70 in
P 1/2 time: 341 msec
S' Lateral: 5.9 cm
Single Plane A4C EF: 13.7 %
Weight: 3604.96 oz

## 2021-02-15 LAB — BASIC METABOLIC PANEL
Anion gap: 12 (ref 5–15)
BUN: 17 mg/dL (ref 8–23)
CO2: 25 mmol/L (ref 22–32)
Calcium: 10 mg/dL (ref 8.9–10.3)
Chloride: 103 mmol/L (ref 98–111)
Creatinine, Ser: 1.51 mg/dL — ABNORMAL HIGH (ref 0.61–1.24)
GFR, Estimated: 49 mL/min — ABNORMAL LOW (ref >60)
Glucose, Bld: 97 mg/dL (ref 70–99)
Potassium: 3.6 mmol/L (ref 3.5–5.1)
Sodium: 140 mmol/L (ref 135–145)

## 2021-02-15 LAB — CBC
HCT: 33.5 % — ABNORMAL LOW (ref 39.0–52.0)
Hemoglobin: 12 g/dL — ABNORMAL LOW (ref 13.0–17.0)
MCH: 28.6 pg (ref 26.0–34.0)
MCHC: 35.8 g/dL (ref 30.0–36.0)
MCV: 79.8 fL — ABNORMAL LOW (ref 80.0–100.0)
Platelets: 126 10*3/uL — ABNORMAL LOW (ref 150–400)
RBC: 4.2 MIL/uL — ABNORMAL LOW (ref 4.22–5.81)
RDW: 15.7 % — ABNORMAL HIGH (ref 11.5–15.5)
WBC: 6.5 10*3/uL (ref 4.0–10.5)
nRBC: 0 % (ref 0.0–0.2)

## 2021-02-15 LAB — HEPARIN LEVEL (UNFRACTIONATED)
Heparin Unfractionated: 0.33 IU/mL (ref 0.30–0.70)
Heparin Unfractionated: 0.39 IU/mL (ref 0.30–0.70)

## 2021-02-15 MED ORDER — SODIUM CHLORIDE 0.9 % IV SOLN: 250.0000 mL | INTRAVENOUS | Status: DC | PRN

## 2021-02-15 MED ORDER — SODIUM CHLORIDE 0.9% FLUSH
3.0000 mL | Freq: Two times a day (BID) | INTRAVENOUS | Status: DC
  Administered 2021-02-15 – 2021-02-19 (×4): 3 mL via INTRAVENOUS

## 2021-02-15 MED ORDER — POTASSIUM CHLORIDE CRYS ER 20 MEQ PO TBCR
40.0000 meq | EXTENDED_RELEASE_TABLET | Freq: Once | ORAL | Status: AC
  Administered 2021-02-15: 40 meq via ORAL
  Filled 2021-02-15: qty 2

## 2021-02-15 MED ORDER — SPIRONOLACTONE 12.5 MG HALF TABLET
12.5000 mg | ORAL_TABLET | Freq: Every day | ORAL | Status: DC
  Administered 2021-02-15 – 2021-02-19 (×5): 12.5 mg via ORAL
  Filled 2021-02-15 (×5): qty 1

## 2021-02-15 MED ORDER — ASPIRIN 81 MG PO CHEW
81.0000 mg | CHEWABLE_TABLET | ORAL | Status: AC
  Administered 2021-02-16: 81 mg via ORAL
  Filled 2021-02-15: qty 1

## 2021-02-15 MED ORDER — SODIUM CHLORIDE 0.9% FLUSH: 3.0000 mL | INTRAVENOUS | Status: DC | PRN

## 2021-02-15 MED ORDER — ROSUVASTATIN CALCIUM 20 MG PO TABS
40.0000 mg | ORAL_TABLET | Freq: Every day | ORAL | Status: DC
  Administered 2021-02-15 – 2021-02-19 (×5): 40 mg via ORAL
  Filled 2021-02-15 (×5): qty 2

## 2021-02-15 MED ORDER — SODIUM CHLORIDE 0.9 % IV SOLN: INTRAVENOUS | Status: DC

## 2021-02-15 NOTE — Progress Notes (Signed)
ANTICOAGULATION CONSULT NOTE   Pharmacy Consult for Heparin Indication: chest pain/ACS  No Known Allergies  Patient Measurements: Height: 5\' 10"  (177.8 cm) Weight: 107.5 kg (237 lb) IBW/kg (Calculated) : 73 Heparin Dosing Weight: 96.1 kg  Vital Signs: Temp: 98 F (36.7 C) (02/12 0024) Temp Source: Oral (02/12 0024) BP: 103/66 (02/12 0024) Pulse Rate: 78 (02/11 2352)  Labs: Recent Labs    02/14/21 0915 02/14/21 1122 02/14/21 1747 02/14/21 2337  HGB 13.1  --   --   --   HCT 37.1*  --   --   --   PLT 155  --   --   --   LABPROT  --   --  15.9*  --   INR  --   --  1.3*  --   HEPARINUNFRC  --   --   --  0.39  CREATININE 1.49*  --   --   --   TROPONINIHS 36* 99* 121*  --      Estimated Creatinine Clearance: 55 mL/min (A) (by C-G formula based on SCr of 1.49 mg/dL (H)).   Medical History: Past Medical History:  Diagnosis Date   DM (diabetes mellitus), type 2 ([place])    OSA on CPAP     Medications:  Medications Prior to Admission  Medication Sig Dispense Refill Last Dose   acidophilus (RISAQUAD) CAPS capsule Take 1 capsule by mouth daily.   02/13/2021   amLODipine (NORVASC) 10 MG tablet Take 10 mg by mouth daily.   02/13/2021   aspirin EC 81 MG tablet Take 81 mg by mouth daily. Swallow whole.   02/13/2021   docusate sodium (COLACE) 100 MG capsule Take 100 mg by mouth 2 (two) times daily.   02/13/2021   JANUMET 50-500 MG tablet Take 1 tablet by mouth 2 (two) times daily.   02/13/2021   Multiple Vitamins-Minerals (MULTIVITAMIN WITH MINERALS) tablet Take 1 tablet by mouth daily.   02/13/2021   Omega-3 Fatty Acids (FISH OIL) 1000 MG CAPS Take 1,000 mg by mouth daily.   02/13/2021   pravastatin (PRAVACHOL) 10 MG tablet Take 10 mg by mouth daily.   02/13/2021   valsartan-hydrochlorothiazide (DIOVAN-HCT) 320-12.5 MG tablet Take 1 tablet by mouth daily.   02/13/2021    Scheduled:   acidophilus  1 capsule Oral Daily   aspirin EC  81 mg Oral Daily   docusate sodium  100 mg Oral  BID   furosemide  40 mg Intravenous Q12H   insulin aspart  0-5 Units Subcutaneous QHS   insulin aspart  0-6 Units Subcutaneous TID WC   multivitamin with minerals  1 tablet Oral Daily   omega-3 acid ethyl esters  1 g Oral BID   pravastatin  10 mg Oral Daily   Infusions:   heparin 1,200 Units/hr (02/15/21 0000)   nitroGLYCERIN Stopped (02/14/21 1753)   PRN:   Assessment: 89 yom with a history of OSA and CPAP. Patient is presenting with SOB. Heparin per pharmacy consult placed for chest pain/ACS.  Patient is not on anticoagulation prior to arrival.  Hgb 13.1; plt 155  2/12 AM update:  Heparin level therapeutic   Goal of Therapy:  Heparin level 0.3-0.7 units/ml Monitor platelets by anticoagulation protocol: Yes   Plan:  Cont heparin at 1200 units/hr Heparin level with AM labs  Narda Bonds, PharmD, [facility] Pharmacist Phone: [phone]

## 2021-02-15 NOTE — Progress Notes (Signed)
°  Echocardiogram 2D Echocardiogram has been performed.  Bryce Cain 02/15/2021, 10:15 AM

## 2021-02-15 NOTE — Progress Notes (Signed)
Progress Note  Patient Name: Bryce Cain Date of Encounter: 02/15/2021  Primary Cardiologist:   None   Subjective   Breathing much better today.  On 2 liters.  No pain.   Inpatient Medications    Scheduled Meds:  acidophilus  1 capsule Oral Daily   aspirin EC  81 mg Oral Daily   docusate sodium  100 mg Oral BID   furosemide  40 mg Intravenous Q12H   insulin aspart  0-5 Units Subcutaneous QHS   insulin aspart  0-6 Units Subcutaneous TID WC   multivitamin with minerals  1 tablet Oral Daily   omega-3 acid ethyl esters  1 g Oral BID   pravastatin  10 mg Oral Daily   Continuous Infusions:  heparin 1,200 Units/hr (02/15/21 0727)   nitroGLYCERIN Stopped (02/14/21 1753)   PRN Meds: acetaminophen, nitroGLYCERIN, ondansetron (ZOFRAN) IV   Vital Signs    Vitals:   02/15/21 0400 02/15/21 0418 02/15/21 0420 02/15/21 0800  BP: 97/64   94/74  Pulse:  83  81  Resp: (!) 31 19  18   Temp: 98 F (36.7 C)   97.8 F (36.6 C)  TempSrc: Oral   Oral  SpO2: 96% 94%  96%  Weight:   102.2 kg   Height:        Intake/Output Summary (Last 24 hours) at 02/15/2021 0838 Last data filed at 02/15/2021 0804 Gross per 24 hour  Intake 215.63 ml  Output 2050 ml  Net -1834.37 ml   Filed Weights   02/14/21 0912 02/15/21 0420  Weight: 107.5 kg 102.2 kg    Telemetry    NSR, PVCs - Personally Reviewed  ECG    NA - Personally Reviewed  Physical Exam   GEN: No acute distress.   Neck: Positive JVD to the jaw at 45 degrees Cardiac: RRR, no murmurs, rubs, or gallops.  Respiratory:     Few basilar crackles.  GI: Soft, nontender, non-distended  MS: No  edema; No deformity. Neuro:  Nonfocal  Psych: Normal affect   Labs    Chemistry Recent Labs  Lab 02/14/21 0915 02/15/21 0333  NA 140 140  K 3.8 3.6  CL 105 103  CO2 22 25  GLUCOSE 188* 97  BUN 17 17  CREATININE 1.49* 1.51*  CALCIUM 10.1 10.0  GFRNONAA 50* 49*  ANIONGAP 13 12     Hematology Recent Labs  Lab  02/14/21 0915 02/15/21 0333  WBC 7.5 6.5  RBC 4.61 4.20*  HGB 13.1 12.0*  HCT 37.1* 33.5*  MCV 80.5 79.8*  MCH 28.4 28.6  MCHC 35.3 35.8  RDW 15.9* 15.7*  PLT 155 126*    Cardiac EnzymesNo results for input(s): TROPONINI in the last 168 hours. No results for input(s): TROPIPOC in the last 168 hours.   BNP Recent Labs  Lab 02/14/21 0915  BNP 707.0*     DDimer No results for input(s): DDIMER in the last 168 hours.   Radiology    DG Chest Portable 1 View  Result Date: 02/14/2021 CLINICAL DATA:  Dyspnea. EXAM: PORTABLE CHEST 1 VIEW COMPARISON:  None. FINDINGS: Normal cardiomediastinal contours. Lung volumes are low. Bilateral pleural effusions are identified. There is diffuse pulmonary vascular congestion. Atelectasis noted in the left base. IMPRESSION: 1. Low lung volumes with mild CHF. 2. Left base atelectasis. Electronically Signed   By: Kerby Moors M.D.   On: 02/14/2021 10:51    Cardiac Studies   Echo:  Pending  Patient Profile  71 y.o. male with no prior cardiac history.  Presents with acute heart failure.    Assessment & Plan    Acute HF:  Improved with IV diuresis.  Intake and output is not complete as he was in the ED.    BP is soft and off of IV NTG.  Holding on ARB/ARNI pending the cath tomorrow with his low BP and increased creat.  Echo will be done this morning.  I will continue the IV Lasix two doses today and hold in the AM.   I will call his brother who is an oncologist in [place].   Sleep apnea:  On CPAP at night.   DM:  Continue current therapy.    HTN:   BP was low and ARB initially held.  As above   CKD IIIA:  Creat is up but stable.    Elevated troponin:  He will need right and left heart cath.  I have scheduled for tomorrow.    Continue heparin.  Platelets fell a little so we will follow.    For questions or updates, please contact [place] Please consult [email or web address] for contact info under Cardiology/STEMI.   Signed, Minus Breeding,  MD  02/15/2021, 8:38 AM

## 2021-02-15 NOTE — Consult Note (Signed)
Advanced Heart Failure Team Consult Note   Primary Physician: [facility] PCP-Cardiologist:  None  Reason for Consultation: Acute systolic HF  HPI:    Bryce Cain is seen today for evaluation of acute systolic HF at the request of Dr. Percival Spanish.   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  Denies h/o known cardiac disease. Over past 3 weeks has had increasing dyspnea and orthopnea. Denied CP or LE edema. Symptoms progressed and came to ER.   In ER BNP 707. Hs Trop 36-> 99-> 121. CXR with pulmonary edema. Placed on Bipap and given IV lasix with improvement.   ECG sinus tach 121 wide LBBB (181 ms) Personally reviewed  Echo with EF 20-25% with anterior/apical WMA suggestive of previous anterior MI. Moderate MR. RV normal     Review of Systems: [y] = yes, [ ]  = no   General: Weight gain [ ] ; Weight loss [ ] ; Anorexia [ ] ; Fatigue Blue.Reese ]; Fever [ ] ; Chills [ ] ; Weakness [ ]   Cardiac: Chest pain/pressure [ ] ; Resting SOB Blue.Reese ]; Exertional SOB Blue.Reese ]; Orthopnea [ ] ; Pedal Edema [ ] ; Palpitations [ ] ; Syncope [ ] ; Presyncope [ ] ; Paroxysmal nocturnal dyspnea[y ]  Pulmonary: Cough [ ] ; Wheezing[ ] ; Hemoptysis[ ] ; Sputum [ ] ; Snoring [ y]  GI: Vomiting[ ] ; Dysphagia[ ] ; Melena[ ] ; Hematochezia [ ] ; Heartburn[ ] ; Abdominal pain [ ] ; Constipation [ ] ; Diarrhea [ ] ; BRBPR [ ]   GU: Hematuria[ ] ; Dysuria [ ] ; Nocturia[ ]   Vascular: Pain in legs with walking [ ] ; Pain in feet with lying flat [ ] ; Non-healing sores [ ] ; Stroke [ ] ; TIA [ ] ; Slurred speech [ ] ;  Neuro: Headaches[ ] ; Vertigo[ ] ; Seizures[ ] ; Paresthesias[ ] ;Blurred vision [ ] ; Diplopia [ ] ; Vision changes [ ]   Ortho/Skin: Arthritis Blue.Reese ]; Joint pain [ y]; Muscle pain [ ] ; Joint swelling [ ] ; Back Pain [ ] ; Rash [ ]   Psych: Depression[ ] ; Anxiety[ ]   Heme: Bleeding problems [ ] ; Clotting disorders [ ] ; Anemia [ ]   Endocrine:  Diabetes [ y]; Thyroid dysfunction[ ]   Home Medications Prior to Admission medications   Medication Sig Start Date End Date Taking? Authorizing Provider  acidophilus (RISAQUAD) CAPS capsule Take 1 capsule by mouth daily.   Yes [provider]  amLODipine (NORVASC) 10 MG tablet Take 10 mg by mouth daily. 01/14/21  Yes [provider]  aspirin EC 81 MG tablet Take 81 mg by mouth daily. Swallow whole.   Yes [provider]  docusate sodium (COLACE) 100 MG capsule Take 100 mg by mouth 2 (two) times daily.   Yes [provider]  JANUMET 50-500 MG tablet Take 1 tablet by mouth 2 (two) times daily. 01/15/21  Yes [provider]  Multiple Vitamins-Minerals (MULTIVITAMIN WITH MINERALS) tablet Take 1 tablet by mouth daily.   Yes [provider]  Omega-3 Fatty Acids (FISH OIL) 1000 MG CAPS Take 1,000 mg by mouth daily.   Yes [provider]  pravastatin (PRAVACHOL) 10 MG tablet Take 10 mg by mouth daily. 01/14/21  Yes [provider]  valsartan-hydrochlorothiazide (DIOVAN-HCT) 320-12.5 MG tablet Take 1 tablet by mouth daily. 01/13/21  Yes [provider]    Past Medical History: Past Medical History:  Diagnosis Date   DM (diabetes mellitus), type 2 ([place])    OSA on CPAP  Past Surgical History: History reviewed. No pertinent surgical history.  Family History: Family History  Problem Relation Age of Onset   Heart attack Father     Social History: Social History   Socioeconomic History   Marital status: Married    Spouse name: Not on file   Number of children: Not on file   Years of education: Not on file   Highest education level: Not on file  Occupational History   Not on file  Tobacco Use   Smoking status: Not on file   Smokeless tobacco: Not on file  Substance and Sexual Activity   Alcohol use: Not on file   Drug use: Not on file   Sexual activity: Not on file  Other Topics Concern   Not on file   Social History Narrative   Not on file   Social Determinants of Health   Financial Resource Strain: Not on file  Food Insecurity: Not on file  Transportation Needs: Not on file  Physical Activity: Not on file  Stress: Not on file  Social Connections: Not on file    Allergies:  No Known Allergies  Objective:    Vital Signs:   Temp:  [97.8 F (36.6 C)-98 F (36.7 C)] 97.8 F (36.6 C) (02/12 1100) Pulse Rate:  [73-93] 79 (02/12 1100) Resp:  [14-31] 19 (02/12 1100) BP: (94-132)/(64-93) 96/75 (02/12 1100) SpO2:  [94 %-100 %] 96 % (02/12 1100) Weight:  [102.2 kg] 102.2 kg (02/12 0420) Last BM Date: 02/13/21  Weight change: Filed Weights   02/14/21 0912 02/15/21 0420  Weight: 107.5 kg 102.2 kg    Intake/Output:   Intake/Output Summary (Last 24 hours) at 02/15/2021 1306 Last data filed at 02/15/2021 0843 Gross per 24 hour  Intake 215.63 ml  Output 2200 ml  Net -1984.37 ml      Physical Exam    General:  Well appearing. No resp difficulty HEENT: normal Neck: supple. JVP 7 . Carotids 2+ bilat; no bruits. No lymphadenopathy or thyromegaly appreciated. Cor: PMI nondisplaced. Regular rate & rhythm. No rubs, gallops or murmurs. Lungs: clear Abdomen: obese soft, nontender, nondistended. No hepatosplenomegaly. No bruits or masses. Good bowel sounds. Extremities: no cyanosis, clubbing, rash, trace edema Neuro: alert & orientedx3, cranial nerves grossly intact. moves all 4 extremities w/o difficulty. Affect pleasant   Telemetry   Sinus 70-80s Personally reviewed   EKG    Per HPI   Labs   Basic Metabolic Panel: Recent Labs  Lab 02/14/21 0915 02/14/21 1747 02/15/21 0333  NA 140  --  140  K 3.8  --  3.6  CL 105  --  103  CO2 22  --  25  GLUCOSE 188*  --  97  BUN 17  --  17  CREATININE 1.49*  --  1.51*  CALCIUM 10.1  --  10.0  MG 1.7 1.6*  --     Liver Function Tests: No results for input(s): AST, ALT, ALKPHOS, BILITOT, PROT, ALBUMIN in the last 168  hours. No results for input(s): LIPASE, AMYLASE in the last 168 hours. No results for input(s): AMMONIA in the last 168 hours.  CBC: Recent Labs  Lab 02/14/21 0915 02/15/21 0333  WBC 7.5 6.5  HGB 13.1 12.0*  HCT 37.1* 33.5*  MCV 80.5 79.8*  PLT 155 126*    Cardiac Enzymes: No results for input(s): CKTOTAL, CKMB, CKMBINDEX, TROPONINI in the last 168 hours.  BNP: BNP (last 3 results) Recent Labs    02/14/21 0915  BNP  707.0*    ProBNP (last 3 results) No results for input(s): PROBNP in the last 8760 hours.   CBG: Recent Labs  Lab 02/14/21 2223 02/15/21 1058  GLUCAP 89 106*    Coagulation Studies: Recent Labs    02/14/21 1747  LABPROT 15.9*  INR 1.3*     Imaging   ECHOCARDIOGRAM COMPLETE  Result Date: 02/15/2021    ECHOCARDIOGRAM REPORT   Patient Name:   Bryce Cain Date of Exam: 02/15/2021 Medical Rec #:  [id number]    Height:       70.0 in Accession #:    [id number]   Weight:       225.3 lb Date of Birth:  05-Sep-1950    BSA:          2.196 m Patient Age:    20 years     BP:           94/74 mmHg Patient Gender: M            HR:           84 bpm. Exam Location:  Inpatient Procedure: 2D Echo STAT ECHO Indications:    acute diastolic chf  History:        Patient has no prior history of Echocardiogram examinations.  Sonographer:    Johny Chess RDCS Referring Phys: [place]  1. Left ventricular ejection fraction, by estimation, is 20 to 25%. The left ventricle has severely decreased function. The left ventricle demonstrates regional wall motion abnormalities (see scoring diagram/findings for description). There is mild concentric left ventricular hypertrophy. Left ventricular diastolic function could not be evaluated. There is akinesis of the left ventricular, mid inferoseptal wall and anteroseptal wall. There is akinesis of the left ventricular, apical septal wall, inferior wall and inferolateral wall. There is akinesis of the left ventricular,  entire apical segment and anterior wall.  2. Right ventricular systolic function is normal. The right ventricular size is normal. Tricuspid regurgitation signal is inadequate for assessing PA pressure.  3. Left atrial size was severely dilated.  4. The mitral valve is degenerative. Mild to moderate mitral valve regurgitation. No evidence of mitral stenosis.  5. The aortic valve is tricuspid. Aortic valve regurgitation is mild. Aortic valve sclerosis/calcification is present, without any evidence of aortic stenosis.  6. Pulmonic valve regurgitation is moderate.  7. Aortic dilatation noted. There is mild dilatation of the aortic root, measuring 38 mm.  8. The inferior vena cava is dilated in size with >50% respiratory variability, suggesting right atrial pressure of 8 mmHg. FINDINGS  Left Ventricle: Left ventricular ejection fraction, by estimation, is 20 to 25%. The left ventricle has severely decreased function. The left ventricle demonstrates regional wall motion abnormalities. The left ventricular internal cavity size was normal  in size. There is mild concentric left ventricular hypertrophy. Left ventricular diastolic function could not be evaluated. Right Ventricle: The right ventricular size is normal. No increase in right ventricular wall thickness. Right ventricular systolic function is normal. Tricuspid regurgitation signal is inadequate for assessing PA pressure. Left Atrium: Left atrial size was severely dilated. Right Atrium: Right atrial size was normal in size. Pericardium: There is no evidence of pericardial effusion. Mitral Valve: The mitral valve is degenerative in appearance. There is mild thickening of the mitral valve leaflet(s). Mild to moderate mitral annular calcification. Mild to moderate mitral valve regurgitation. No evidence of mitral valve stenosis. Tricuspid Valve: The tricuspid valve is normal in structure. Tricuspid valve regurgitation is trivial.  No evidence of tricuspid stenosis.  Aortic Valve: The aortic valve is tricuspid. Aortic valve regurgitation is mild. Aortic regurgitation PHT measures 341 msec. Aortic valve sclerosis/calcification is present, without any evidence of aortic stenosis. Pulmonic Valve: The pulmonic valve was normal in structure. Pulmonic valve regurgitation is moderate. No evidence of pulmonic stenosis. Aorta: Aortic dilatation noted. There is mild dilatation of the aortic root, measuring 38 mm. Venous: The inferior vena cava is dilated in size with greater than 50% respiratory variability, suggesting right atrial pressure of 8 mmHg. IAS/Shunts: No atrial level shunt detected by color flow Doppler.  LEFT VENTRICLE PLAX 2D LVIDd:         7.30 cm LVIDs:         5.90 cm LV PW:         1.20 cm LV IVS:        1.20 cm LVOT diam:     2.50 cm LV SV:         61 LV SV Index:   28 LVOT Area:     4.91 cm  LV Volumes (MOD) LV vol d, MOD A4C: 417.0 ml LV vol s, MOD A4C: 360.0 ml LV SV MOD A4C:     417.0 ml RIGHT VENTRICLE         IVC TAPSE (M-mode): 2.5 cm  IVC diam: 2.40 cm LEFT ATRIUM              Index        RIGHT ATRIUM           Index LA diam:        5.50 cm  2.50 cm/m   RA Area:     19.00 cm LA Vol (A2C):   108.0 ml 49.19 ml/m  RA Volume:   52.50 ml  23.91 ml/m LA Vol (A4C):   93.7 ml  42.67 ml/m LA Biplane Vol: 102.0 ml 46.45 ml/m  AORTIC VALVE LVOT Vmax:   74.10 cm/s LVOT Vmean:  48.000 cm/s LVOT VTI:    0.124 m AI PHT:      341 msec  AORTA Ao Root diam: 3.80 cm Ao Asc diam:  3.40 cm  SHUNTS Systemic VTI:  0.12 m Systemic Diam: 2.50 cm Fransico Him MD Electronically signed by Fransico Him MD Signature Date/Time: 02/15/2021/10:55:51 AM    Final      Medications:     Current Medications:  acidophilus  1 capsule Oral Daily   aspirin EC  81 mg Oral Daily   docusate sodium  100 mg Oral BID   furosemide  40 mg Intravenous Q12H   insulin aspart  0-5 Units Subcutaneous QHS   insulin aspart  0-6 Units Subcutaneous TID WC   multivitamin with minerals  1 tablet Oral  Daily   omega-3 acid ethyl esters  1 g Oral BID   pravastatin  10 mg Oral Daily   sodium chloride flush  3 mL Intravenous Q12H    Infusions:  heparin 1,250 Units/hr (02/15/21 0843)       Assessment/Plan   1. Acute systolic HF - Echo with EF 20-25% with anterior/apical WMA suggestive of previous anterior MI. Moderate MR. RV normal  LBBB may also be contributing - He is volume overloaded.  - BP soft.  - Continue IV diuresis - R/L cath tomorrow followed by cMRI - Titrate GDMT post cath as BP and renal function tolerate  2. DM2 - will need SGLT2i +/- GLP1RA - continue SSI   3. CKD 3a - SCr  1.5 - unclear baseline - resume ARB as BP tolerates post cath - Consider SGLT2i   4. OSA - continue CPAP  5. Probable CAD - On ASA/statin - LDL 74 - Will switch prava to rousva  6. Microcytic anemia - check iron stores - check FOBT  7. Thrombocytopenia - follow   Length of Stay: 1  Glori Bickers, MD  02/15/2021, 1:06 PM  Advanced Heart Failure Team Pager [phone] (M-F; 7a - 5p)  Please contact [facility] for night-coverage after hours (4p -7a ) and weekends on [email or web address]

## 2021-02-15 NOTE — Progress Notes (Signed)
ANTICOAGULATION CONSULT NOTE - Follow Up Consult  Pharmacy Consult for heparin Indication: chest pain/ACS  No Known Allergies  Patient Measurements: Height: 5\' 10"  (177.8 cm) Weight: 102.2 kg (225 lb 5 oz) IBW/kg (Calculated) : 73 Heparin Dosing Weight: 96.1 kg  Vital Signs: Temp: 97.8 F (36.6 C) (02/12 0800) Temp Source: Oral (02/12 0800) BP: 94/74 (02/12 0800) Pulse Rate: 81 (02/12 0800)  Labs: Recent Labs    02/14/21 0915 02/14/21 1122 02/14/21 1747 02/14/21 2337 02/15/21 0333  HGB 13.1  --   --   --  12.0*  HCT 37.1*  --   --   --  33.5*  PLT 155  --   --   --  126*  LABPROT  --   --  15.9*  --   --   INR  --   --  1.3*  --   --   HEPARINUNFRC  --   --   --  0.39 0.33  CREATININE 1.49*  --   --   --  1.51*  TROPONINIHS 36* 99* 121*  --   --     Estimated Creatinine Clearance: 53 mL/min (A) (by C-G formula based on SCr of 1.51 mg/dL (H)).  Assessment: 71 yo M admitted on 2/11 for shortness of breath. History of OSA on CPAP. Heparin per pharmacy consult for chest pain/ACS. Patient was not on anticoagulation prior to arrival.  Heparin level 0.33 - therapeutic on the low end of the goal, so it is reasonable to empirically increase the rate to ensure the patient continues to be therapeutic. Hemoglobin down slightly at 12, platelets down slightly at 126. No issues with bleeding or infusion noted per RN.   Goal of Therapy:  Heparin level 0.3-0.7 units/ml Monitor platelets by anticoagulation protocol: Yes   Plan:  Heparin 1250 units/hr Daily heparin level, CBC Monitor for s/sx of bleeding  Thank you for including pharmacy in the care of this patient.  Zenaida Deed, PharmD PGY1 Acute Care Pharmacy Resident  Phone: [phone] 02/15/2021  8:23 AM  Please check [email or web address] for unit-specific pharmacy phone numbers.

## 2021-02-16 ENCOUNTER — Encounter (HOSPITAL_COMMUNITY): Payer: Self-pay | Admitting: Internal Medicine

## 2021-02-16 ENCOUNTER — Ambulatory Visit: Payer: Self-pay | Admitting: Cardiology

## 2021-02-16 ENCOUNTER — Inpatient Hospital Stay (HOSPITAL_COMMUNITY)
Admission: EM | Disposition: A | Discharge status: Home / Self Care | Payer: Self-pay | Source: Home / Self Care | Attending: Cardiology

## 2021-02-16 ENCOUNTER — Other Ambulatory Visit (HOSPITAL_COMMUNITY): Payer: Self-pay

## 2021-02-16 DIAGNOSIS — I2510 Atherosclerotic heart disease of native coronary artery without angina pectoris: Secondary | ICD-10-CM

## 2021-02-16 DIAGNOSIS — I5023 Acute on chronic systolic (congestive) heart failure: Secondary | ICD-10-CM

## 2021-02-16 DIAGNOSIS — I5021 Acute systolic (congestive) heart failure: Secondary | ICD-10-CM | POA: Diagnosis not present

## 2021-02-16 DIAGNOSIS — D649 Anemia, unspecified: Secondary | ICD-10-CM | POA: Diagnosis not present

## 2021-02-16 DIAGNOSIS — N1831 Chronic kidney disease, stage 3a: Secondary | ICD-10-CM | POA: Diagnosis not present

## 2021-02-16 DIAGNOSIS — I447 Left bundle-branch block, unspecified: Secondary | ICD-10-CM | POA: Diagnosis not present

## 2021-02-16 HISTORY — PX: RIGHT/LEFT HEART CATH AND CORONARY ANGIOGRAPHY: CATH118266

## 2021-02-16 LAB — POCT I-STAT EG7
Acid-Base Excess: 3 mmol/L — ABNORMAL HIGH (ref 0.0–2.0)
Acid-Base Excess: 4 mmol/L — ABNORMAL HIGH (ref 0.0–2.0)
Bicarbonate: 30.2 mmol/L — ABNORMAL HIGH (ref 20.0–28.0)
Bicarbonate: 30.8 mmol/L — ABNORMAL HIGH (ref 20.0–28.0)
Calcium, Ion: 1.33 mmol/L (ref 1.15–1.40)
Calcium, Ion: 1.34 mmol/L (ref 1.15–1.40)
HCT: 36 % — ABNORMAL LOW (ref 39.0–52.0)
HCT: 37 % — ABNORMAL LOW (ref 39.0–52.0)
Hemoglobin: 12.2 g/dL — ABNORMAL LOW (ref 13.0–17.0)
Hemoglobin: 12.6 g/dL — ABNORMAL LOW (ref 13.0–17.0)
O2 Saturation: 64 %
O2 Saturation: 65 %
Potassium: 3.5 mmol/L (ref 3.5–5.1)
Potassium: 3.5 mmol/L (ref 3.5–5.1)
Sodium: 141 mmol/L (ref 135–145)
Sodium: 142 mmol/L (ref 135–145)
TCO2: 32 mmol/L (ref 22–32)
TCO2: 32 mmol/L (ref 22–32)
pCO2, Ven: 55.1 mmHg (ref 44.0–60.0)
pCO2, Ven: 55.4 mmHg (ref 44.0–60.0)
pH, Ven: 7.347 (ref 7.250–7.430)
pH, Ven: 7.353 (ref 7.250–7.430)
pO2, Ven: 35 mmHg (ref 32.0–45.0)
pO2, Ven: 36 mmHg (ref 32.0–45.0)

## 2021-02-16 LAB — FERRITIN: Ferritin: 218 ng/mL (ref 24–336)

## 2021-02-16 LAB — GLUCOSE, CAPILLARY
Glucose-Capillary: 77 mg/dL (ref 70–99)
Glucose-Capillary: 111 mg/dL — ABNORMAL HIGH (ref 70–99)
Glucose-Capillary: 105 mg/dL — ABNORMAL HIGH (ref 70–99)
Glucose-Capillary: 100 mg/dL — ABNORMAL HIGH (ref 70–99)
Glucose-Capillary: 128 mg/dL — ABNORMAL HIGH (ref 70–99)

## 2021-02-16 LAB — POCT I-STAT 7, (LYTES, BLD GAS, ICA,H+H)
Acid-Base Excess: 6 mmol/L — ABNORMAL HIGH (ref 0.0–2.0)
Bicarbonate: 31.6 mmol/L — ABNORMAL HIGH (ref 20.0–28.0)
Calcium, Ion: 1.27 mmol/L (ref 1.15–1.40)
HCT: 36 % — ABNORMAL LOW (ref 39.0–52.0)
Hemoglobin: 12.2 g/dL — ABNORMAL LOW (ref 13.0–17.0)
O2 Saturation: 98 %
Potassium: 3.4 mmol/L — ABNORMAL LOW (ref 3.5–5.1)
Sodium: 143 mmol/L (ref 135–145)
TCO2: 33 mmol/L — ABNORMAL HIGH (ref 22–32)
pCO2 arterial: 50.9 mmHg — ABNORMAL HIGH (ref 32.0–48.0)
pH, Arterial: 7.401 (ref 7.350–7.450)
pO2, Arterial: 103 mmHg (ref 83.0–108.0)

## 2021-02-16 LAB — CBC
HCT: 32.8 % — ABNORMAL LOW (ref 39.0–52.0)
Hemoglobin: 11.9 g/dL — ABNORMAL LOW (ref 13.0–17.0)
MCH: 28.7 pg (ref 26.0–34.0)
MCHC: 36.3 g/dL — ABNORMAL HIGH (ref 30.0–36.0)
MCV: 79 fL — ABNORMAL LOW (ref 80.0–100.0)
Platelets: 154 10*3/uL (ref 150–400)
RBC: 4.15 MIL/uL — ABNORMAL LOW (ref 4.22–5.81)
RDW: 15.5 % (ref 11.5–15.5)
WBC: 5.9 10*3/uL (ref 4.0–10.5)
nRBC: 0 % (ref 0.0–0.2)

## 2021-02-16 LAB — IRON AND TIBC
Iron: 42 ug/dL — ABNORMAL LOW (ref 45–182)
Saturation Ratios: 15 % — ABNORMAL LOW (ref 17.9–39.5)
TIBC: 279 ug/dL (ref 250–450)
UIBC: 237 ug/dL

## 2021-02-16 LAB — BASIC METABOLIC PANEL
Anion gap: 11 (ref 5–15)
BUN: 19 mg/dL (ref 8–23)
CO2: 25 mmol/L (ref 22–32)
Calcium: 9.9 mg/dL (ref 8.9–10.3)
Chloride: 104 mmol/L (ref 98–111)
Creatinine, Ser: 1.43 mg/dL — ABNORMAL HIGH (ref 0.61–1.24)
GFR, Estimated: 52 mL/min — ABNORMAL LOW (ref >60)
Glucose, Bld: 113 mg/dL — ABNORMAL HIGH (ref 70–99)
Potassium: 3.5 mmol/L (ref 3.5–5.1)
Sodium: 140 mmol/L (ref 135–145)

## 2021-02-16 LAB — HEPARIN LEVEL (UNFRACTIONATED): Heparin Unfractionated: 0.42 IU/mL (ref 0.30–0.70)

## 2021-02-16 LAB — MAGNESIUM: Magnesium: 1.8 mg/dL (ref 1.7–2.4)

## 2021-02-16 LAB — TROPONIN I (HIGH SENSITIVITY): Troponin I (High Sensitivity): 99 ng/L — ABNORMAL HIGH (ref <18)

## 2021-02-16 SURGERY — RIGHT/LEFT HEART CATH AND CORONARY ANGIOGRAPHY
Anesthesia: LOCAL

## 2021-02-16 MED ORDER — ONDANSETRON HCL 4 MG/2ML IJ SOLN: 4.0000 mg | Freq: Four times a day (QID) | INTRAMUSCULAR | Status: DC | PRN

## 2021-02-16 MED ORDER — HEPARIN SODIUM (PORCINE) 1000 UNIT/ML IJ SOLN
INTRAMUSCULAR | Status: AC
  Filled 2021-02-16: qty 10

## 2021-02-16 MED ORDER — HYDRALAZINE HCL 20 MG/ML IJ SOLN: 10.0000 mg | INTRAMUSCULAR | Status: AC | PRN

## 2021-02-16 MED ORDER — POTASSIUM CHLORIDE CRYS ER 20 MEQ PO TBCR: 40.0000 meq | EXTENDED_RELEASE_TABLET | Freq: Once | ORAL | Status: DC

## 2021-02-16 MED ORDER — MIDAZOLAM HCL 2 MG/2ML IJ SOLN
INTRAMUSCULAR | Status: DC | PRN
  Administered 2021-02-16: 1 mg via INTRAVENOUS

## 2021-02-16 MED ORDER — FENTANYL CITRATE (PF) 100 MCG/2ML IJ SOLN
INTRAMUSCULAR | Status: AC
  Filled 2021-02-16: qty 2

## 2021-02-16 MED ORDER — HEPARIN SODIUM (PORCINE) 1000 UNIT/ML IJ SOLN
INTRAMUSCULAR | Status: DC | PRN
  Administered 2021-02-16: 5000 [IU] via INTRAVENOUS

## 2021-02-16 MED ORDER — ACETAMINOPHEN 325 MG PO TABS: 650.0000 mg | ORAL_TABLET | ORAL | Status: DC | PRN

## 2021-02-16 MED ORDER — POTASSIUM CHLORIDE CRYS ER 20 MEQ PO TBCR
40.0000 meq | EXTENDED_RELEASE_TABLET | Freq: Two times a day (BID) | ORAL | Status: AC
  Administered 2021-02-16 (×2): 40 meq via ORAL
  Filled 2021-02-16 (×2): qty 2

## 2021-02-16 MED ORDER — SODIUM CHLORIDE 0.9 % IV SOLN: 250.0000 mL | INTRAVENOUS | Status: DC | PRN

## 2021-02-16 MED ORDER — IOHEXOL 350 MG/ML SOLN
INTRAVENOUS | Status: DC | PRN
  Administered 2021-02-16: 60 mL via INTRA_ARTERIAL

## 2021-02-16 MED ORDER — VERAPAMIL HCL 2.5 MG/ML IV SOLN
INTRAVENOUS | Status: DC | PRN
  Administered 2021-02-16 (×2): 10 mL via INTRA_ARTERIAL

## 2021-02-16 MED ORDER — HEPARIN (PORCINE) IN NACL 1000-0.9 UT/500ML-% IV SOLN
INTRAVENOUS | Status: DC | PRN
  Administered 2021-02-16 (×2): 500 mL

## 2021-02-16 MED ORDER — HEPARIN (PORCINE) IN NACL 1000-0.9 UT/500ML-% IV SOLN
INTRAVENOUS | Status: AC
  Filled 2021-02-16: qty 1000

## 2021-02-16 MED ORDER — SODIUM CHLORIDE 0.9% FLUSH: 3.0000 mL | INTRAVENOUS | Status: DC | PRN

## 2021-02-16 MED ORDER — LIDOCAINE HCL (PF) 1 % IJ SOLN
INTRAMUSCULAR | Status: AC
  Filled 2021-02-16: qty 30

## 2021-02-16 MED ORDER — MAGNESIUM SULFATE 2 GM/50ML IV SOLN
2.0000 g | Freq: Once | INTRAVENOUS | Status: AC
  Administered 2021-02-16: 2 g via INTRAVENOUS
  Filled 2021-02-16: qty 50

## 2021-02-16 MED ORDER — ENOXAPARIN SODIUM 40 MG/0.4ML IJ SOSY
40.0000 mg | PREFILLED_SYRINGE | INTRAMUSCULAR | Status: DC
  Administered 2021-02-17 – 2021-02-19 (×3): 40 mg via SUBCUTANEOUS
  Filled 2021-02-16 (×3): qty 0.4

## 2021-02-16 MED ORDER — VERAPAMIL HCL 2.5 MG/ML IV SOLN
INTRAVENOUS | Status: AC
  Filled 2021-02-16: qty 2

## 2021-02-16 MED ORDER — FUROSEMIDE 10 MG/ML IJ SOLN
80.0000 mg | Freq: Two times a day (BID) | INTRAMUSCULAR | Status: AC
  Administered 2021-02-16 – 2021-02-17 (×2): 80 mg via INTRAVENOUS
  Filled 2021-02-16 (×2): qty 8

## 2021-02-16 MED ORDER — LABETALOL HCL 5 MG/ML IV SOLN: 10.0000 mg | INTRAVENOUS | Status: DC | PRN

## 2021-02-16 MED ORDER — MIDAZOLAM HCL 2 MG/2ML IJ SOLN
INTRAMUSCULAR | Status: AC
  Filled 2021-02-16: qty 2

## 2021-02-16 MED ORDER — SODIUM CHLORIDE 0.9% FLUSH
3.0000 mL | Freq: Two times a day (BID) | INTRAVENOUS | Status: DC
  Administered 2021-02-18 (×2): 3 mL via INTRAVENOUS

## 2021-02-16 MED ORDER — FENTANYL CITRATE (PF) 100 MCG/2ML IJ SOLN
INTRAMUSCULAR | Status: DC | PRN
  Administered 2021-02-16 (×2): 25 ug via INTRAVENOUS

## 2021-02-16 MED ORDER — LIDOCAINE HCL (PF) 1 % IJ SOLN
INTRAMUSCULAR | Status: DC | PRN
  Administered 2021-02-16 (×3): 2 mL

## 2021-02-16 SURGICAL SUPPLY — 16 items
CATH 5FR JL3.5 JR4 ANG PIG MP (CATHETERS) ×1 IMPLANT
CATH BALLN WEDGE 5F 110CM (CATHETERS) ×1 IMPLANT
CATH INFINITI 5 FR 3DRC (CATHETERS) ×1 IMPLANT
CATH INFINITI 5FR JL4 (CATHETERS) ×1 IMPLANT
DEVICE RAD COMP TR BAND LRG (VASCULAR PRODUCTS) ×2 IMPLANT
GLIDESHEATH SLEND SS 6F .021 (SHEATH) ×2 IMPLANT
GUIDEWIRE .025 260CM (WIRE) ×1 IMPLANT
GUIDEWIRE INQWIRE 1.5J.035X260 (WIRE) IMPLANT
INQWIRE 1.5J .035X260CM (WIRE) ×2
KIT MICROPUNCTURE NIT STIFF (SHEATH) ×1 IMPLANT
PACK CARDIAC CATHETERIZATION (CUSTOM PROCEDURE TRAY) ×2 IMPLANT
SHEATH GLIDE SLENDER 4/5FR (SHEATH) ×1 IMPLANT
SHEATH PROBE COVER 6X72 (BAG) ×1 IMPLANT
TRANSDUCER W/STOPCOCK (MISCELLANEOUS) ×2 IMPLANT
WIRE HI TORQ VERSACORE-J 145CM (WIRE) ×1 IMPLANT
WIRE MICROINTRODUCER 60CM (WIRE) ×1 IMPLANT

## 2021-02-16 NOTE — Consult Note (Signed)
Cardiology Consultation:   Patient ID: Bryce Cain MRN: [id number]; DOB: 1950/02/14  Admit date: 02/14/2021 Date of Consult: 02/16/2021  PCP:  [facility], [facility] Providers Cardiologist:     Patient Profile:   Bryce Cain is a 71 y.o. male with a hx of OSA w/CPAP, DM, HTN, HLD who is being seen 02/16/2021 for the evaluation of new CM suspect 2/2 LBBB at the request of Dr. Haroldine Laws.  History of Present Illness:   Bryce Cain came with about 3 weeks of progressive SOB, edema, bloating, PMD had referred to Pulmonary and cardiology though with further escalation in SOB came to the ER, initially did require BIPAP support in the ER, started in IV diuretics HF team consulted  W/u so far Presenting EKG wide LBBB (ST vs AT?) TTE with LVEF 20-25%, + WMA akinesis of the left ventricular, mid inferoseptal wall and anteroseptal wall.  akinesis of the left ventricular, apical septal wall, inferior wall and inferolateral wall. akinesis of the left ventricular, entire apical segment and anterior wall. LA 51mm CATH today w no obstructive CAD, wedge was 25, CO 5.5/CI 2.5 Pending c.MRI  LABS K+ 3.8 >> 3.5 Mag 1.7, 1.6, 1.8 BUN/Creat 17/1.49 >> 19/1.43 BNP 707 WBC 5.9 H/H 11.9/32 Plts 154 TSH 0.662  EP is asked to see for NICM, suspect 2/2 LBBB and consideration for CRT-D  He is feeling better, no active SOB. He has not had CP or syncope. Thinks his cough/SOB perhaps started back in Nov, by the unusual CPAP report and initially the thoughts were that he was feeling poorly because of malfunctioning CPAP machine.   Past Medical History:  Diagnosis Date   DM (diabetes mellitus), type 2 ([place])    OSA on CPAP     Past Surgical History:  Procedure Laterality Date   RIGHT/LEFT HEART CATH AND CORONARY ANGIOGRAPHY N/A 02/16/2021   Procedure: RIGHT/LEFT HEART CATH AND CORONARY ANGIOGRAPHY;  Surgeon: Jolaine Artist, MD;  Location: [facility];  Service:  Cardiovascular;  Laterality: N/A;     Home Medications:  Prior to Admission medications   Medication Sig Start Date End Date Taking? Authorizing Provider  acidophilus (RISAQUAD) CAPS capsule Take 1 capsule by mouth daily.   Yes [provider]  amLODipine (NORVASC) 10 MG tablet Take 10 mg by mouth daily. 01/14/21  Yes [provider]  aspirin EC 81 MG tablet Take 81 mg by mouth daily. Swallow whole.   Yes [provider]  docusate sodium (COLACE) 100 MG capsule Take 100 mg by mouth 2 (two) times daily.   Yes [provider]  JANUMET 50-500 MG tablet Take 1 tablet by mouth 2 (two) times daily. 01/15/21  Yes [provider]  Multiple Vitamins-Minerals (MULTIVITAMIN WITH MINERALS) tablet Take 1 tablet by mouth daily.   Yes [provider]  Omega-3 Fatty Acids (FISH OIL) 1000 MG CAPS Take 1,000 mg by mouth daily.   Yes [provider]  pravastatin (PRAVACHOL) 10 MG tablet Take 10 mg by mouth daily. 01/14/21  Yes [provider]  valsartan-hydrochlorothiazide (DIOVAN-HCT) 320-12.5 MG tablet Take 1 tablet by mouth daily. 01/13/21  Yes [provider]    Inpatient Medications: Scheduled Meds:  acidophilus  1 capsule Oral Daily   aspirin EC  81 mg Oral Daily   docusate sodium  100 mg Oral BID   [START ON 02/17/2021] enoxaparin (LOVENOX) injection  40 mg Subcutaneous Q24H   insulin aspart  0-5 Units Subcutaneous QHS  insulin aspart  0-6 Units Subcutaneous TID WC   multivitamin with minerals  1 tablet Oral Daily   omega-3 acid ethyl esters  1 g Oral BID   potassium chloride  40 mEq Oral Once   rosuvastatin  40 mg Oral Daily   sodium chloride flush  3 mL Intravenous Q12H   sodium chloride flush  3 mL Intravenous Q12H   spironolactone  12.5 mg Oral Daily   Continuous Infusions:  sodium chloride     PRN Meds: sodium chloride, acetaminophen, hydrALAZINE, labetalol, nitroGLYCERIN, ondansetron (ZOFRAN) IV, sodium  chloride flush  Allergies:   No Known Allergies  Social History:   Social History   Socioeconomic History   Marital status: Married    Spouse name: Not on file   Number of children: Not on file   Years of education: Not on file   Highest education level: Not on file  Occupational History   Not on file  Tobacco Use   Smoking status: Not on file   Smokeless tobacco: Not on file  Substance and Sexual Activity   Alcohol use: Not on file   Drug use: Not on file   Sexual activity: Not on file  Other Topics Concern   Not on file  Social History Narrative   Not on file   Social Determinants of Health   Financial Resource Strain: Not on file  Food Insecurity: Not on file  Transportation Needs: Not on file  Physical Activity: Not on file  Stress: Not on file  Social Connections: Not on file  Intimate Partner Violence: Not on file    Family History:   Family History  Problem Relation Age of Onset   Heart attack Father      ROS:  Please see the history of present illness.  All other ROS reviewed and negative.     Physical Exam/Data:   Vitals:   02/16/21 1040 02/16/21 1045 02/16/21 1050 02/16/21 1143  BP: 109/76  115/68 132/76  Pulse: 92 89 (!) 0 76  Resp: (!) 23 (!) 32  19  Temp:    (!) 97.4 F (36.3 C)  TempSrc:    Oral  SpO2: 96%   94%  Weight:      Height:        Intake/Output Summary (Last 24 hours) at 02/16/2021 1152 Last data filed at 02/16/2021 1149 Gross per 24 hour  Intake 538.3 ml  Output 1350 ml  Net -811.7 ml   Last 3 Weights 02/16/2021 02/15/2021 02/14/2021  Weight (lbs) 226 lb 6.6 oz 225 lb 5 oz 237 lb  Weight (kg) 102.7 kg 102.2 kg 107.502 kg     Body mass index is 32.49 kg/m.  General:  Well nourished, well developed, in no acute distress HEENT: normal Neck: no JVD Vascular: No carotid bruits; Distal pulses 2+ bilaterally Cardiac: RRR; no murmurs, gallops or rubs Lungs:  CTA b/l, no wheezing, rhonchi or rales  Abd: soft, nontender  Ext:  no edema Musculoskeletal:  No deformities Skin: warm and dry  Neuro:  no focal abnormalities noted Psych:  Normal affect   EKG:  The EKG was personally reviewed and demonstrates:    Probably ST 129bpm, LBBB 180ms  Telemetry:  Telemetry was personally reviewed and demonstrates:   SR 70's currently with occ PVCs  ER reviewed, arrived tachy 120's-130's with PVCs, NSVT episodes, his HR reduction was gradual and appears to be a ST > SR  Relevant CV Studies:  02/16/21: R/LHC   Prox RCA  lesion is 30% stenosed.   1st Diag lesion is 20% stenosed.   Prox Cx to Mid Cx lesion is 20% stenosed.   Findings:   Ao = 102/70 (85) LV = 104/24 RA =  10 RV = 47/17 PA = 52/22 (33) PCW = 25 Fick cardiac output/index = 5.5/2.5 PVR =1.5 WU Ao sat = 98% PA sat = 65%, 66%   Assessment: 1. 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 Elevated filling pressures with normal cardiac output  02/15/21: TTE  1. Left ventricular ejection fraction, by estimation, is 20 to 25%. The  left ventricle has severely decreased function. The left ventricle  demonstrates regional wall motion abnormalities (see scoring  diagram/findings for description). There is mild  concentric left ventricular hypertrophy. Left ventricular diastolic  function could not be evaluated. There is akinesis of the left  ventricular, mid inferoseptal wall and anteroseptal wall. There is  akinesis of the left ventricular, apical septal wall,  inferior wall and inferolateral wall. There is akinesis of the left  ventricular, entire apical segment and anterior wall.   2. Right ventricular systolic function is normal. The right ventricular  size is normal. Tricuspid regurgitation signal is inadequate for assessing  PA pressure.   3. Left atrial size was severely dilated.   4. The mitral valve is degenerative. Mild to moderate mitral valve  regurgitation. No evidence of mitral  stenosis.   5. The aortic valve is tricuspid. Aortic valve regurgitation is mild.  Aortic valve sclerosis/calcification is present, without any evidence of  aortic stenosis.   6. Pulmonic valve regurgitation is moderate.   7. Aortic dilatation noted. There is mild dilatation of the aortic root,  measuring 38 mm.   8. The inferior vena cava is dilated in size with >50% respiratory  variability, suggesting right atrial pressure of 8 mmHg.   Laboratory Data:  High Sensitivity Troponin:   Recent Labs  Lab 02/14/21 0915 02/14/21 1122 02/14/21 1747  TROPONINIHS 36* 99* 121*     Chemistry Recent Labs  Lab 02/14/21 0915 02/14/21 1747 02/15/21 0333 02/16/21 0451  NA 140  --  140 140  K 3.8  --  3.6 3.5  CL 105  --  103 104  CO2 22  --  25 25  GLUCOSE 188*  --  97 113*  BUN 17  --  17 19  CREATININE 1.49*  --  1.51* 1.43*  CALCIUM 10.1  --  10.0 9.9  MG 1.7 1.6*  --  1.8  GFRNONAA 50*  --  49* 52*  ANIONGAP 13  --  12 11    No results for input(s): PROT, ALBUMIN, AST, ALT, ALKPHOS, BILITOT in the last 168 hours. Lipids  Recent Labs  Lab 02/15/21 0333  CHOL 137  TRIG 41  HDL 55  LDLCALC 74  CHOLHDL 2.5    Hematology Recent Labs  Lab 02/14/21 0915 02/15/21 0333 02/16/21 0451  WBC 7.5 6.5 5.9  RBC 4.61 4.20* 4.15*  HGB 13.1 12.0* 11.9*  HCT 37.1* 33.5* 32.8*  MCV 80.5 79.8* 79.0*  MCH 28.4 28.6 28.7  MCHC 35.3 35.8 36.3*  RDW 15.9* 15.7* 15.5  PLT 155 126* 154   Thyroid  Recent Labs  Lab 02/14/21 1747  TSH 0.662  FREET4 1.15*    BNP Recent Labs  Lab 02/14/21 0915  BNP 707.0*    DDimer No results for input(s): DDIMER in the last 168 hours.   Radiology/Studies:  DG Chest Portable 1 View Result Date: 02/14/2021 CLINICAL DATA:  Dyspnea. EXAM: PORTABLE CHEST 1 VIEW COMPARISON:  None. FINDINGS: Normal cardiomediastinal contours. Lung volumes are low. Bilateral pleural effusions are identified. There is diffuse pulmonary vascular congestion.  Atelectasis noted in the left base. IMPRESSION: 1. Low lung volumes with mild CHF. 2. Left base atelectasis. Electronically Signed   By: Kerby Moors M.D.   On: 02/14/2021 10:51        Assessment and Plan:   New NICM LBBB with very broad QRS 14ms New acute CHF (systolic)  No historical cardiac data HF team is managing He was on an ARB  outpt, planned to advance GDMT as able  Dr. Quentin Ore has seen him, discussed management strategies, potential role for CRT-D Would plan to see him in about 50mo, after he has been on GDMT.    Risk Assessment/Risk Scores:     For questions or updates, please contact [facility] Please consult [email or web address] for contact info under    Signed, Baldwin [place], PA-C  02/16/2021 11:52 AM

## 2021-02-16 NOTE — H&P (View-Only) (Signed)
Advanced Heart Failure Rounding Note  PCP-Cardiologist: None   Subjective:     Unable to compare weights, mix of bed and standing. 1.67L UOP charted yesterday. However, reports brisk diuresis. Received 40 mg lasix IV BID X 3 doses.  Scr stable at 1.43. K 3.5.  Breathing improved. No CP. RN reports he has had some anxiety.   Objective:   Weight Range: 102.7 kg Body mass index is 32.49 kg/m.   Vital Signs:   Temp:  [97.4 F (36.3 C)-98.3 F (36.8 C)] 97.9 F (36.6 C) (02/13 0706) Pulse Rate:  [74-90] 86 (02/13 0706) Resp:  [15-30] 21 (02/13 0706) BP: (94-120)/(68-87) 103/68 (02/13 0706) SpO2:  [90 %-98 %] 97 % (02/13 0706) Weight:  [102.7 kg] 102.7 kg (02/13 0600) Last BM Date: 02/15/21  Weight change: Filed Weights   02/14/21 0912 02/15/21 0420 02/16/21 0600  Weight: 107.5 kg 102.2 kg 102.7 kg    Intake/Output:   Intake/Output Summary (Last 24 hours) at 02/16/2021 0839 Last data filed at 02/16/2021 0827 Gross per 24 hour  Intake 398.72 ml  Output 1400 ml  Net -1001.28 ml      Physical Exam    General:  No resp difficulty HEENT: Normal Neck: Supple. JVP elevated. Carotids 2+ bilat; no bruits.  Cor: PMI nondisplaced. Regular rate & rhythm. No rubs, gallops or murmurs. Lungs: Clear Abdomen: obese, soft, nontender, nondistended.  Extremities: No cyanosis, clubbing, rash, trace, edema Neuro: Alert & orientedx3, cranial nerves grossly intact. moves all 4 extremities w/o difficulty. Affect pleasant     TELE: Sinus 80s, no arrhythmias  Labs    CBC Recent Labs    02/15/21 0333 02/16/21 0451  WBC 6.5 5.9  HGB 12.0* 11.9*  HCT 33.5* 32.8*  MCV 79.8* 79.0*  PLT 126* 638   Basic Metabolic Panel Recent Labs    02/14/21 0915 02/14/21 1747 02/15/21 0333 02/16/21 0451  NA 140  --  140 140  K 3.8  --  3.6 3.5  CL 105  --  103 104  CO2 22  --  25 25  GLUCOSE 188*  --  97 113*  BUN 17  --  17 19  CREATININE 1.49*  --  1.51* 1.43*  CALCIUM  10.1  --  10.0 9.9  MG 1.7 1.6*  --   --    Liver Function Tests No results for input(s): AST, ALT, ALKPHOS, BILITOT, PROT, ALBUMIN in the last 72 hours. No results for input(s): LIPASE, AMYLASE in the last 72 hours. Cardiac Enzymes No results for input(s): CKTOTAL, CKMB, CKMBINDEX, TROPONINI in the last 72 hours.  BNP: BNP (last 3 results) Recent Labs    02/14/21 0915  BNP 707.0*    ProBNP (last 3 results) No results for input(s): PROBNP in the last 8760 hours.   D-Dimer No results for input(s): DDIMER in the last 72 hours. Hemoglobin A1C Recent Labs    02/14/21 1747  HGBA1C 5.4   Fasting Lipid Panel Recent Labs    02/15/21 0333  CHOL 137  HDL 55  LDLCALC 74  TRIG 41  CHOLHDL 2.5   Thyroid Function Tests Recent Labs    02/14/21 1747  TSH 0.662    Other results:   Imaging    ECHOCARDIOGRAM COMPLETE  Result Date: 02/15/2021    ECHOCARDIOGRAM REPORT   Patient Name:   Bryce Cain Date of Exam: 02/15/2021 Medical Rec #:  [id number]    Height:       70.0 in Accession #:  [id number]   Weight:       225.3 lb Date of Birth:  1950-10-11    BSA:          2.196 m Patient Age:    71 years     BP:           94/74 mmHg Patient Gender: M            HR:           84 bpm. Exam Location:  Inpatient Procedure: 2D Echo STAT ECHO Indications:    acute diastolic chf  History:        Patient has no prior history of Echocardiogram examinations.  Sonographer:    Johny Chess RDCS Referring Phys: Tat Momoli  1. Left ventricular ejection fraction, by estimation, is 20 to 25%. The left ventricle has severely decreased function. The left ventricle demonstrates regional wall motion abnormalities (see scoring diagram/findings for description). There is mild concentric left ventricular hypertrophy. Left ventricular diastolic function could not be evaluated. There is akinesis of the left ventricular, mid inferoseptal wall and anteroseptal wall. There is akinesis of the  left ventricular, apical septal wall, inferior wall and inferolateral wall. There is akinesis of the left ventricular, entire apical segment and anterior wall.  2. Right ventricular systolic function is normal. The right ventricular size is normal. Tricuspid regurgitation signal is inadequate for assessing PA pressure.  3. Left atrial size was severely dilated.  4. The mitral valve is degenerative. Mild to moderate mitral valve regurgitation. No evidence of mitral stenosis.  5. The aortic valve is tricuspid. Aortic valve regurgitation is mild. Aortic valve sclerosis/calcification is present, without any evidence of aortic stenosis.  6. Pulmonic valve regurgitation is moderate.  7. Aortic dilatation noted. There is mild dilatation of the aortic root, measuring 38 mm.  8. The inferior vena cava is dilated in size with >50% respiratory variability, suggesting right atrial pressure of 8 mmHg. FINDINGS  Left Ventricle: Left ventricular ejection fraction, by estimation, is 20 to 25%. The left ventricle has severely decreased function. The left ventricle demonstrates regional wall motion abnormalities. The left ventricular internal cavity size was normal  in size. There is mild concentric left ventricular hypertrophy. Left ventricular diastolic function could not be evaluated. Right Ventricle: The right ventricular size is normal. No increase in right ventricular wall thickness. Right ventricular systolic function is normal. Tricuspid regurgitation signal is inadequate for assessing PA pressure. Left Atrium: Left atrial size was severely dilated. Right Atrium: Right atrial size was normal in size. Pericardium: There is no evidence of pericardial effusion. Mitral Valve: The mitral valve is degenerative in appearance. There is mild thickening of the mitral valve leaflet(s). Mild to moderate mitral annular calcification. Mild to moderate mitral valve regurgitation. No evidence of mitral valve stenosis. Tricuspid Valve: The  tricuspid valve is normal in structure. Tricuspid valve regurgitation is trivial. No evidence of tricuspid stenosis. Aortic Valve: The aortic valve is tricuspid. Aortic valve regurgitation is mild. Aortic regurgitation PHT measures 341 msec. Aortic valve sclerosis/calcification is present, without any evidence of aortic stenosis. Pulmonic Valve: The pulmonic valve was normal in structure. Pulmonic valve regurgitation is moderate. No evidence of pulmonic stenosis. Aorta: Aortic dilatation noted. There is mild dilatation of the aortic root, measuring 38 mm. Venous: The inferior vena cava is dilated in size with greater than 50% respiratory variability, suggesting right atrial pressure of 8 mmHg. IAS/Shunts: No atrial level shunt detected by color flow Doppler.  LEFT VENTRICLE  PLAX 2D LVIDd:         7.30 cm LVIDs:         5.90 cm LV PW:         1.20 cm LV IVS:        1.20 cm LVOT diam:     2.50 cm LV SV:         61 LV SV Index:   28 LVOT Area:     4.91 cm  LV Volumes (MOD) LV vol d, MOD A4C: 417.0 ml LV vol s, MOD A4C: 360.0 ml LV SV MOD A4C:     417.0 ml RIGHT VENTRICLE         IVC TAPSE (M-mode): 2.5 cm  IVC diam: 2.40 cm LEFT ATRIUM              Index        RIGHT ATRIUM           Index LA diam:        5.50 cm  2.50 cm/m   RA Area:     19.00 cm LA Vol (A2C):   108.0 ml 49.19 ml/m  RA Volume:   52.50 ml  23.91 ml/m LA Vol (A4C):   93.7 ml  42.67 ml/m LA Biplane Vol: 102.0 ml 46.45 ml/m  AORTIC VALVE LVOT Vmax:   74.10 cm/s LVOT Vmean:  48.000 cm/s LVOT VTI:    0.124 m AI PHT:      341 msec  AORTA Ao Root diam: 3.80 cm Ao Asc diam:  3.40 cm  SHUNTS Systemic VTI:  0.12 m Systemic Diam: 2.50 cm Fransico Him MD Electronically signed by Fransico Him MD Signature Date/Time: 02/15/2021/10:55:51 AM    Final      Medications:     Scheduled Medications:  acidophilus  1 capsule Oral Daily   aspirin EC  81 mg Oral Daily   docusate sodium  100 mg Oral BID   insulin aspart  0-5 Units Subcutaneous QHS   insulin  aspart  0-6 Units Subcutaneous TID WC   multivitamin with minerals  1 tablet Oral Daily   omega-3 acid ethyl esters  1 g Oral BID   rosuvastatin  40 mg Oral Daily   sodium chloride flush  3 mL Intravenous Q12H   spironolactone  12.5 mg Oral Daily    Infusions:  sodium chloride     sodium chloride 10 mL/hr at 02/16/21 0559   heparin 1,250 Units/hr (02/16/21 0410)    PRN Medications: sodium chloride, acetaminophen, nitroGLYCERIN, ondansetron (ZOFRAN) IV, sodium chloride flush    Patient Profile   71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   Assessment/Plan   1. Acute systolic HF - Echo with EF 20-25% with anterior/apical WMA suggestive of previous anterior MI. Moderate MR. RV normal  LBBB may also be contributing - UOP charted not very brisk with 3 doses lasix 40 mg IV BID. ? Mix of bed and standing weights. Will decide on diuretics post cath. - Continue spiro 12.5 mg daily - BP soft.  - R/L cath today followed by cMRI - Titrate GDMT post cath as BP and renal function tolerate   2. DM2 - will need SGLT2i +/- GLP1RA - A1c 5.4% - continue SSI    3. CKD 3a - SCr 1.4 - unclear baseline - resume ARB as BP tolerates post cath - Consider SGLT2i    4. OSA - continue CPAP  5. Probable CAD - On ASA/statin - LDL 74 - Switched prava to rosuva   6. Microcytic anemia - check iron stores - check FOBT - Replace iron if needed post MRI   7. Thrombocytopenia - follow  8. Hypokalemia - K 3.5. Supp today - check magnesium  Length of Stay: 2  FINCH, LINDSAY N, PA-C  02/16/2021, 8:39 AM  Advanced Heart Failure Team Pager [phone] (M-F; 7a - 5p)  Please contact [facility] for night-coverage after hours (5p -7a ) and weekends on [email or web address]  Patient seen and examined with the above-signed Advanced Practice Provider and/or Housestaff. I personally reviewed laboratory data, imaging studies and relevant  notes. I independently examined the patient and formulated the important aspects of the plan. I have edited the note to reflect any of my changes or salient points. I have personally discussed the plan with the patient and/or family.  Diuresed well. Breathing better but still a bit orthopneic. No CP.   General:  Sitting up in bed  No resp difficulty HEENT: normal Neck: supple. JVP up Carotids 2+ bilat; no bruits. No lymphadenopathy or thryomegaly appreciated. Cor: PMI nondisplaced. Regular rate & rhythm. No rubs, gallops or murmurs. Lungs: clear Abdomen: obese soft, nontender, nondistended. No hepatosplenomegaly. No bruits or masses. Good bowel sounds. Extremities: no cyanosis, clubbing, rash, 1+ edema Neuro: alert & orientedx3, cranial nerves grossly intact. moves all 4 extremities w/o difficulty. Affect pleasant  He is improving but still seems volume overloaded. Plan R/L cath today +/- cMRI. Further plans pending results of cath. May need CRT-D.   Glori Bickers, MD  9:35 AM

## 2021-02-16 NOTE — Progress Notes (Signed)
ANTICOAGULATION CONSULT NOTE - Follow Up Consult  Pharmacy Consult for heparin Indication: chest pain/ACS  No Known Allergies  Patient Measurements: Height: 5\' 10"  (177.8 cm) Weight: 102.7 kg (226 lb 6.6 oz) IBW/kg (Calculated) : 73 Heparin Dosing Weight: 96.1 kg  Vital Signs: Temp: 97.9 F (36.6 C) (02/13 0706) Temp Source: Oral (02/13 0706) BP: 103/68 (02/13 0706) Pulse Rate: 86 (02/13 0706)  Labs: Recent Labs    02/14/21 0915 02/14/21 1122 02/14/21 1747 02/14/21 2337 02/15/21 0333 02/16/21 0451  HGB 13.1  --   --   --  12.0* 11.9*  HCT 37.1*  --   --   --  33.5* 32.8*  PLT 155  --   --   --  126* 154  LABPROT  --   --  15.9*  --   --   --   INR  --   --  1.3*  --   --   --   HEPARINUNFRC  --   --   --  0.39 0.33 0.42  CREATININE 1.49*  --   --   --  1.51* 1.43*  TROPONINIHS 36* 99* 121*  --   --   --      Estimated Creatinine Clearance: 56.1 mL/min (A) (by C-G formula based on SCr of 1.43 mg/dL (H)).  Assessment: 71 yo M admitted on 2/11 for shortness of breath. History of OSA on CPAP. Heparin per pharmacy consult for chest pain/ACS. Patient was not on anticoagulation prior to arrival.  Heparin level 0.42 and therapeutic this morning. Hemoglobin down slightly to 11.9, platelets back to normal. No issues with bleeding or infusion noted.   Goal of Therapy:  Heparin level 0.3-0.7 units/ml Monitor platelets by anticoagulation protocol: Yes   Plan:  Continue heparin drip at 1250 units/hr Daily heparin level, CBC Monitor for s/sx of bleeding F/U after cath today  Thank you for involving pharmacy in this patient's care.  Renold Genta, PharmD, BCPS Clinical Pharmacist Clinical phone for 02/16/2021 until 3p is [phone] 02/16/2021 8:34 AM  **Pharmacist phone directory can be found on [email or web address] listed under [place]**

## 2021-02-16 NOTE — TOC Benefit Eligibility Note (Signed)
Patient Teacher, English as a foreign language completed.    The patient is currently admitted and upon discharge could be taking Entresto 24-26 mg.  The current 30 day co-pay is, $40.00.   The patient is currently admitted and upon discharge could be taking Jardiance 10 mg.  The current 30 day co-pay is, $40.00.   The patient is currently admitted and upon discharge could be taking Farxiga 10 mg.  The current 30 day co-pay is, $64.00.   The patient is insured through [place], [place] Patient Advocate Specialist [place] Patient Advocate Team Direct Number: [phone]  Fax: [phone]

## 2021-02-16 NOTE — TOC CM/SW Note (Signed)
.. °  Transition of Care [facility]) Screening Note   Patient Details  Name: Bryce Cain Date of Birth: 09/24/1950   Transition of Care [facility]) CM/SW Contact:    Erenest Rasher, RN Phone Number: 02/16/2021, 3:18 PM    Transition of Care Department [facility]) has reviewed patient. We will continue to monitor patient advancement through interdisciplinary progression rounds. Will follow up with pt on meds. Waiting PT/OT recommendations.    [place], Heart Failure TOC CM [phone]

## 2021-02-16 NOTE — Progress Notes (Signed)
Pt has refused cpap for tonight stating he had a difficult time with it last night and mask was abad fit.  RT offered to find a new one or adjust the one he had but he states he will be fine on O2 for the night.  Cpap on standby in case he changes his mind.  RT will cont to monitor.

## 2021-02-16 NOTE — Interval H&P Note (Signed)
History and Physical Interval Note:  02/16/2021 9:36 AM  Bryce Cain  has presented today for surgery, with the diagnosis of HF.  The various methods of treatment have been discussed with the patient and family. After consideration of risks, benefits and other options for treatment, the patient has consented to  Procedure(s): RIGHT/LEFT HEART CATH AND CORONARY ANGIOGRAPHY (N/A) and possible coronary angioplasty as a surgical intervention.  The patient's history has been reviewed, patient examined, no change in status, stable for surgery.  I have reviewed the patient's chart and labs.  Questions were answered to the patient's satisfaction.     Asusena Sigley

## 2021-02-16 NOTE — Progress Notes (Addendum)
Advanced Heart Failure Rounding Note  PCP-Cardiologist: None   Subjective:     Unable to compare weights, mix of bed and standing. 1.67L UOP charted yesterday. However, reports brisk diuresis. Received 40 mg lasix IV BID X 3 doses.  Scr stable at 1.43. K 3.5.  Breathing improved. No CP. RN reports he has had some anxiety.   Objective:   Weight Range: 102.7 kg Body mass index is 32.49 kg/m.   Vital Signs:   Temp:  [97.4 F (36.3 C)-98.3 F (36.8 C)] 97.9 F (36.6 C) (02/13 0706) Pulse Rate:  [74-90] 86 (02/13 0706) Resp:  [15-30] 21 (02/13 0706) BP: (94-120)/(68-87) 103/68 (02/13 0706) SpO2:  [90 %-98 %] 97 % (02/13 0706) Weight:  [102.7 kg] 102.7 kg (02/13 0600) Last BM Date: 02/15/21  Weight change: Filed Weights   02/14/21 0912 02/15/21 0420 02/16/21 0600  Weight: 107.5 kg 102.2 kg 102.7 kg    Intake/Output:   Intake/Output Summary (Last 24 hours) at 02/16/2021 0839 Last data filed at 02/16/2021 0827 Gross per 24 hour  Intake 398.72 ml  Output 1400 ml  Net -1001.28 ml      Physical Exam    General:  No resp difficulty HEENT: Normal Neck: Supple. JVP elevated. Carotids 2+ bilat; no bruits.  Cor: PMI nondisplaced. Regular rate & rhythm. No rubs, gallops or murmurs. Lungs: Clear Abdomen: obese, soft, nontender, nondistended.  Extremities: No cyanosis, clubbing, rash, trace, edema Neuro: Alert & orientedx3, cranial nerves grossly intact. moves all 4 extremities w/o difficulty. Affect pleasant     TELE: Sinus 80s, no arrhythmias  Labs    CBC Recent Labs    02/15/21 0333 02/16/21 0451  WBC 6.5 5.9  HGB 12.0* 11.9*  HCT 33.5* 32.8*  MCV 79.8* 79.0*  PLT 126* 568   Basic Metabolic Panel Recent Labs    02/14/21 0915 02/14/21 1747 02/15/21 0333 02/16/21 0451  NA 140  --  140 140  K 3.8  --  3.6 3.5  CL 105  --  103 104  CO2 22  --  25 25  GLUCOSE 188*  --  97 113*  BUN 17  --  17 19  CREATININE 1.49*  --  1.51* 1.43*  CALCIUM  10.1  --  10.0 9.9  MG 1.7 1.6*  --   --    Liver Function Tests No results for input(s): AST, ALT, ALKPHOS, BILITOT, PROT, ALBUMIN in the last 72 hours. No results for input(s): LIPASE, AMYLASE in the last 72 hours. Cardiac Enzymes No results for input(s): CKTOTAL, CKMB, CKMBINDEX, TROPONINI in the last 72 hours.  BNP: BNP (last 3 results) Recent Labs    02/14/21 0915  BNP 707.0*    ProBNP (last 3 results) No results for input(s): PROBNP in the last 8760 hours.   D-Dimer No results for input(s): DDIMER in the last 72 hours. Hemoglobin A1C Recent Labs    02/14/21 1747  HGBA1C 5.4   Fasting Lipid Panel Recent Labs    02/15/21 0333  CHOL 137  HDL 55  LDLCALC 74  TRIG 41  CHOLHDL 2.5   Thyroid Function Tests Recent Labs    02/14/21 1747  TSH 0.662    Other results:   Imaging    ECHOCARDIOGRAM COMPLETE  Result Date: 02/15/2021    ECHOCARDIOGRAM REPORT   Patient Name:   Bryce Cain Date of Exam: 02/15/2021 Medical Rec #:  [id number]    Height:       70.0 in Accession #:  [id number]   Weight:       225.3 lb Date of Birth:  1950/07/17    BSA:          2.196 m Patient Age:    71 years     BP:           94/74 mmHg Patient Gender: M            HR:           84 bpm. Exam Location:  Inpatient Procedure: 2D Echo STAT ECHO Indications:    acute diastolic chf  History:        Patient has no prior history of Echocardiogram examinations.  Sonographer:    Johny Chess RDCS Referring Phys: [place]  1. Left ventricular ejection fraction, by estimation, is 20 to 25%. The left ventricle has severely decreased function. The left ventricle demonstrates regional wall motion abnormalities (see scoring diagram/findings for description). There is mild concentric left ventricular hypertrophy. Left ventricular diastolic function could not be evaluated. There is akinesis of the left ventricular, mid inferoseptal wall and anteroseptal wall. There is akinesis of the  left ventricular, apical septal wall, inferior wall and inferolateral wall. There is akinesis of the left ventricular, entire apical segment and anterior wall.  2. Right ventricular systolic function is normal. The right ventricular size is normal. Tricuspid regurgitation signal is inadequate for assessing PA pressure.  3. Left atrial size was severely dilated.  4. The mitral valve is degenerative. Mild to moderate mitral valve regurgitation. No evidence of mitral stenosis.  5. The aortic valve is tricuspid. Aortic valve regurgitation is mild. Aortic valve sclerosis/calcification is present, without any evidence of aortic stenosis.  6. Pulmonic valve regurgitation is moderate.  7. Aortic dilatation noted. There is mild dilatation of the aortic root, measuring 38 mm.  8. The inferior vena cava is dilated in size with >50% respiratory variability, suggesting right atrial pressure of 8 mmHg. FINDINGS  Left Ventricle: Left ventricular ejection fraction, by estimation, is 20 to 25%. The left ventricle has severely decreased function. The left ventricle demonstrates regional wall motion abnormalities. The left ventricular internal cavity size was normal  in size. There is mild concentric left ventricular hypertrophy. Left ventricular diastolic function could not be evaluated. Right Ventricle: The right ventricular size is normal. No increase in right ventricular wall thickness. Right ventricular systolic function is normal. Tricuspid regurgitation signal is inadequate for assessing PA pressure. Left Atrium: Left atrial size was severely dilated. Right Atrium: Right atrial size was normal in size. Pericardium: There is no evidence of pericardial effusion. Mitral Valve: The mitral valve is degenerative in appearance. There is mild thickening of the mitral valve leaflet(s). Mild to moderate mitral annular calcification. Mild to moderate mitral valve regurgitation. No evidence of mitral valve stenosis. Tricuspid Valve: The  tricuspid valve is normal in structure. Tricuspid valve regurgitation is trivial. No evidence of tricuspid stenosis. Aortic Valve: The aortic valve is tricuspid. Aortic valve regurgitation is mild. Aortic regurgitation PHT measures 341 msec. Aortic valve sclerosis/calcification is present, without any evidence of aortic stenosis. Pulmonic Valve: The pulmonic valve was normal in structure. Pulmonic valve regurgitation is moderate. No evidence of pulmonic stenosis. Aorta: Aortic dilatation noted. There is mild dilatation of the aortic root, measuring 38 mm. Venous: The inferior vena cava is dilated in size with greater than 50% respiratory variability, suggesting right atrial pressure of 8 mmHg. IAS/Shunts: No atrial level shunt detected by color flow Doppler.  LEFT VENTRICLE  PLAX 2D LVIDd:         7.30 cm LVIDs:         5.90 cm LV PW:         1.20 cm LV IVS:        1.20 cm LVOT diam:     2.50 cm LV SV:         61 LV SV Index:   28 LVOT Area:     4.91 cm  LV Volumes (MOD) LV vol d, MOD A4C: 417.0 ml LV vol s, MOD A4C: 360.0 ml LV SV MOD A4C:     417.0 ml RIGHT VENTRICLE         IVC TAPSE (M-mode): 2.5 cm  IVC diam: 2.40 cm LEFT ATRIUM              Index        RIGHT ATRIUM           Index LA diam:        5.50 cm  2.50 cm/m   RA Area:     19.00 cm LA Vol (A2C):   108.0 ml 49.19 ml/m  RA Volume:   52.50 ml  23.91 ml/m LA Vol (A4C):   93.7 ml  42.67 ml/m LA Biplane Vol: 102.0 ml 46.45 ml/m  AORTIC VALVE LVOT Vmax:   74.10 cm/s LVOT Vmean:  48.000 cm/s LVOT VTI:    0.124 m AI PHT:      341 msec  AORTA Ao Root diam: 3.80 cm Ao Asc diam:  3.40 cm  SHUNTS Systemic VTI:  0.12 m Systemic Diam: 2.50 cm Fransico Him MD Electronically signed by Fransico Him MD Signature Date/Time: 02/15/2021/10:55:51 AM    Final      Medications:     Scheduled Medications:  acidophilus  1 capsule Oral Daily   aspirin EC  81 mg Oral Daily   docusate sodium  100 mg Oral BID   insulin aspart  0-5 Units Subcutaneous QHS   insulin  aspart  0-6 Units Subcutaneous TID WC   multivitamin with minerals  1 tablet Oral Daily   omega-3 acid ethyl esters  1 g Oral BID   rosuvastatin  40 mg Oral Daily   sodium chloride flush  3 mL Intravenous Q12H   spironolactone  12.5 mg Oral Daily    Infusions:  sodium chloride     sodium chloride 10 mL/hr at 02/16/21 0559   heparin 1,250 Units/hr (02/16/21 0410)    PRN Medications: sodium chloride, acetaminophen, nitroGLYCERIN, ondansetron (ZOFRAN) IV, sodium chloride flush    Patient Profile   71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   Assessment/Plan   1. Acute systolic HF - Echo with EF 20-25% with anterior/apical WMA suggestive of previous anterior MI. Moderate MR. RV normal  LBBB may also be contributing - UOP charted not very brisk with 3 doses lasix 40 mg IV BID. ? Mix of bed and standing weights. Will decide on diuretics post cath. - Continue spiro 12.5 mg daily - BP soft.  - R/L cath today followed by cMRI - Titrate GDMT post cath as BP and renal function tolerate   2. DM2 - will need SGLT2i +/- GLP1RA - A1c 5.4% - continue SSI    3. CKD 3a - SCr 1.4 - unclear baseline - resume ARB as BP tolerates post cath - Consider SGLT2i    4. OSA - continue CPAP  5. Probable CAD - On ASA/statin - LDL 74 - Switched prava to rosuva   6. Microcytic anemia - check iron stores - check FOBT - Replace iron if needed post MRI   7. Thrombocytopenia - follow  8. Hypokalemia - K 3.5. Supp today - check magnesium  Length of Stay: 2  FINCH, LINDSAY N, PA-C  02/16/2021, 8:39 AM  Advanced Heart Failure Team Pager [phone] (M-F; 7a - 5p)  Please contact [facility] for night-coverage after hours (5p -7a ) and weekends on [email or web address]  Patient seen and examined with the above-signed Advanced Practice Provider and/or Housestaff. I personally reviewed laboratory data, imaging studies and relevant  notes. I independently examined the patient and formulated the important aspects of the plan. I have edited the note to reflect any of my changes or salient points. I have personally discussed the plan with the patient and/or family.  Diuresed well. Breathing better but still a bit orthopneic. No CP.   General:  Sitting up in bed  No resp difficulty HEENT: normal Neck: supple. JVP up Carotids 2+ bilat; no bruits. No lymphadenopathy or thryomegaly appreciated. Cor: PMI nondisplaced. Regular rate & rhythm. No rubs, gallops or murmurs. Lungs: clear Abdomen: obese soft, nontender, nondistended. No hepatosplenomegaly. No bruits or masses. Good bowel sounds. Extremities: no cyanosis, clubbing, rash, 1+ edema Neuro: alert & orientedx3, cranial nerves grossly intact. moves all 4 extremities w/o difficulty. Affect pleasant  He is improving but still seems volume overloaded. Plan R/L cath today +/- cMRI. Further plans pending results of cath. May need CRT-D.   Glori Bickers, MD  9:35 AM

## 2021-02-17 ENCOUNTER — Inpatient Hospital Stay (HOSPITAL_COMMUNITY): Payer: Medicare PPO

## 2021-02-17 DIAGNOSIS — I5021 Acute systolic (congestive) heart failure: Secondary | ICD-10-CM

## 2021-02-17 DIAGNOSIS — I5043 Acute on chronic combined systolic (congestive) and diastolic (congestive) heart failure: Secondary | ICD-10-CM

## 2021-02-17 LAB — GLUCOSE, CAPILLARY
Glucose-Capillary: 98 mg/dL (ref 70–99)
Glucose-Capillary: 195 mg/dL — ABNORMAL HIGH (ref 70–99)
Glucose-Capillary: 95 mg/dL (ref 70–99)
Glucose-Capillary: 96 mg/dL (ref 70–99)

## 2021-02-17 LAB — BASIC METABOLIC PANEL
Anion gap: 10 (ref 5–15)
BUN: 18 mg/dL (ref 8–23)
CO2: 27 mmol/L (ref 22–32)
Calcium: 10.1 mg/dL (ref 8.9–10.3)
Chloride: 101 mmol/L (ref 98–111)
Creatinine, Ser: 1.47 mg/dL — ABNORMAL HIGH (ref 0.61–1.24)
GFR, Estimated: 50 mL/min — ABNORMAL LOW (ref >60)
Glucose, Bld: 99 mg/dL (ref 70–99)
Potassium: 3.9 mmol/L (ref 3.5–5.1)
Sodium: 138 mmol/L (ref 135–145)

## 2021-02-17 LAB — CBC
HCT: 33.2 % — ABNORMAL LOW (ref 39.0–52.0)
Hemoglobin: 12 g/dL — ABNORMAL LOW (ref 13.0–17.0)
MCH: 28.6 pg (ref 26.0–34.0)
MCHC: 36.1 g/dL — ABNORMAL HIGH (ref 30.0–36.0)
MCV: 79.2 fL — ABNORMAL LOW (ref 80.0–100.0)
Platelets: 148 10*3/uL — ABNORMAL LOW (ref 150–400)
RBC: 4.19 MIL/uL — ABNORMAL LOW (ref 4.22–5.81)
RDW: 15.7 % — ABNORMAL HIGH (ref 11.5–15.5)
WBC: 5.6 10*3/uL (ref 4.0–10.5)
nRBC: 0 % (ref 0.0–0.2)

## 2021-02-17 LAB — MAGNESIUM: Magnesium: 2.1 mg/dL (ref 1.7–2.4)

## 2021-02-17 IMAGING — MR MR CARD MORPHOLOGY WO/W CM
45 of 48 series · 45 of 48 positions shown · IV contrast (Contrast agent)
Comparison: none

CLINICAL DATA: 70M with CHF

EXAM:
CARDIAC MRI
TECHNIQUE: The patient was scanned on a 1.5 Tesla Siemens magnet. A dedicated
cardiac coil was used. Functional imaging was done using Fiesta
sequences. [DATE], and 4 chamber views were done to assess for RWMA's.
Modified LOLOA rule using a short axis stack was used to
calculate an ejection fraction on a dedicated work station using
Circle software. The patient received 10 cc of Gadavist. After 10
minutes inversion recovery sequences were used to assess for
infiltration and scar tissue.
CONTRAST:  10 cc  of Gadavist

[Series 4: [id number]_bh · axial · 8.0mm · 1.48mm/px · 1 of 20 slices shown]
[im 1/20]
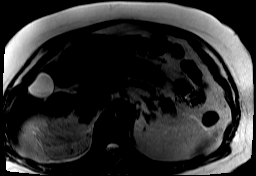

[Series 16: bSSFP · oblique · 8.0mm · 1.79mm/px · 1 of 16 slices shown (1 of 23)]
[im 1/16]
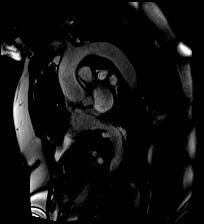

[Series 17: bSSFP · oblique · 8.0mm · 1.79mm/px · 1 of 16 slices shown (2 of 23)]
[im 1/16]
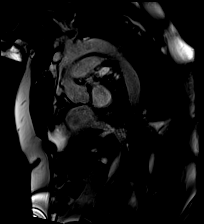

[Series 18: bSSFP · oblique · 8.0mm · 1.79mm/px · 1 of 16 slices shown (3 of 23)]
[im 1/16]
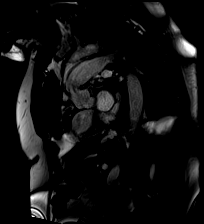

[Series 19: bSSFP · oblique · 8.0mm · 1.79mm/px · 1 of 16 slices shown (4 of 23)]
[im 1/16]
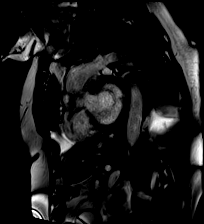

[Series 20: bSSFP · oblique · 8.0mm · 1.79mm/px · 1 of 16 slices shown (5 of 23)]
[im 1/16]
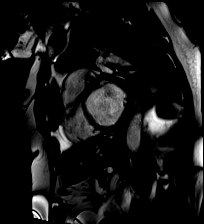

[Series 21: bSSFP · oblique · 8.0mm · 1.79mm/px · 1 of 16 slices shown (6 of 23)]
[im 1/16]
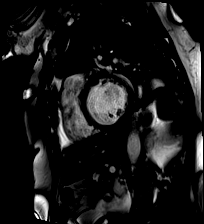

[Series 22: bSSFP · oblique · 8.0mm · 1.79mm/px · 1 of 16 slices shown (7 of 23)]
[im 1/16]
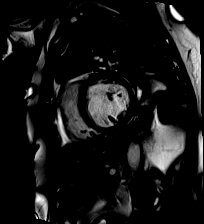

[Series 23: bSSFP · oblique · 8.0mm · 1.79mm/px · 1 of 16 slices shown (8 of 23)]
[im 1/16]
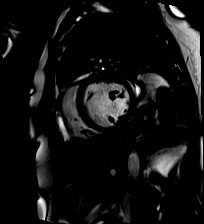

[Series 24: bSSFP · oblique · 8.0mm · 1.79mm/px · 1 of 16 slices shown (9 of 23)]
[im 1/16]
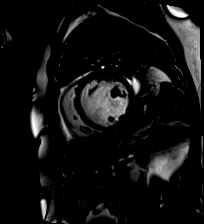

[Series 25: bSSFP · oblique · 8.0mm · 1.79mm/px · 1 of 16 slices shown (10 of 23)]
[im 1/16]
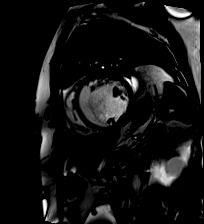

[Series 26: bSSFP · oblique · 8.0mm · 1.79mm/px · 1 of 16 slices shown (11 of 23)]
[im 1/16]
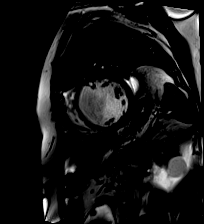

[Series 27: bSSFP · oblique · 8.0mm · 1.79mm/px · 1 of 16 slices shown (12 of 23)]
[im 1/16]
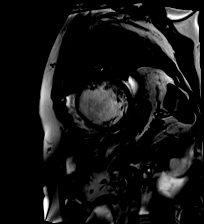

[Series 28: bSSFP · oblique · 8.0mm · 1.79mm/px · 1 of 16 slices shown (13 of 23)]
[im 1/16]
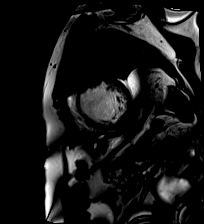

[Series 29: bSSFP · oblique · 8.0mm · 1.79mm/px · 1 of 16 slices shown (14 of 23)]
[im 1/16]
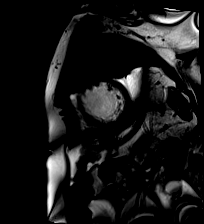

[Series 30: bSSFP · oblique · 8.0mm · 1.79mm/px · 1 of 16 slices shown (15 of 23)]
[im 1/16]
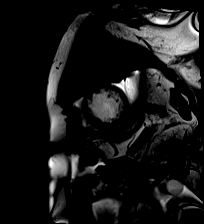

[Series 31: bSSFP · oblique · 8.0mm · 1.79mm/px · 1 of 16 slices shown (16 of 23)]
[im 1/16]
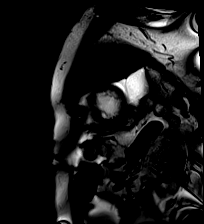

[Series 32: bSSFP · oblique · 8.0mm · 1.79mm/px · 1 of 16 slices shown (17 of 23)]
[im 1/16]
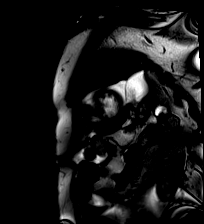

[Series 33: bSSFP · oblique · 8.0mm · 1.79mm/px · 1 of 16 slices shown (18 of 23)]
[im 1/16]
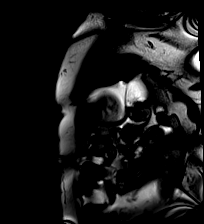

[Series 34: bSSFP · oblique · 8.0mm · 1.79mm/px · 1 of 16 slices shown (19 of 23)]
[im 1/16]
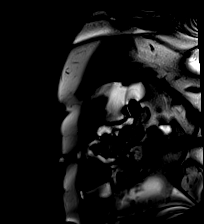

[Series 35: bSSFP · oblique · 8.0mm · 1.79mm/px · 1 of 16 slices shown (20 of 23)]
[im 1/16]
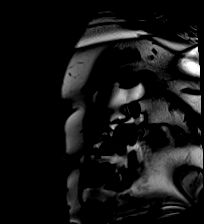

[Series 36: bSSFP · axial · 6.0mm · 1.56mm/px · 1 of 25 slices shown (21 of 23)]
[im 1/25]
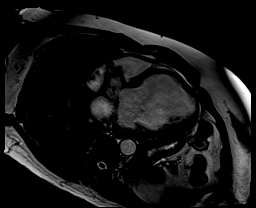

[Series 37: bSSFP · oblique · 6.0mm · 1.41mm/px · 1 of 25 slices shown (22 of 23)]
[im 1/25]
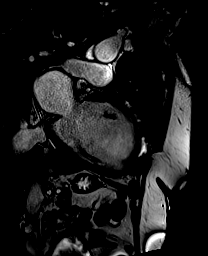

[Series 38: bSSFP · oblique · 6.0mm · 1.41mm/px · 1 of 25 slices shown (23 of 23)]
[im 1/25]
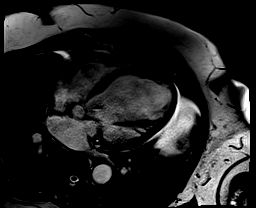

[Series 39: pre short axis · oblique · non-contrast · 8.0mm · 2.25mm/px · 1 of 10 slices shown (1 of 6)]
[im 1/10]
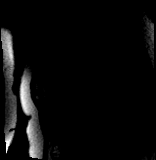

[Series 40: pre short axis · oblique · non-contrast · 8.0mm · 2.25mm/px · 1 of 10 slices shown (2 of 6)]
[im 1/10]
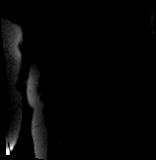

[Series 41: pre short axis · oblique · non-contrast · 8.0mm · 2.25mm/px · 1 of 10 slices shown (3 of 6)]
[im 1/10]
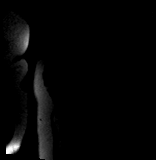

[Series 42: pre short axis · oblique · non-contrast · 8.0mm · 2.25mm/px · 1 of 10 slices shown (4 of 6)]
[im 1/10]
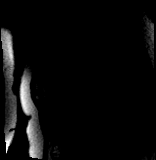

[Series 43: pre short axis · oblique · non-contrast · 8.0mm · 2.25mm/px · 1 of 10 slices shown (5 of 6)]
[im 1/10]
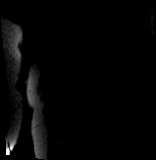

[Series 44: pre short axis · oblique · non-contrast · 8.0mm · 2.25mm/px · 1 of 10 slices shown (6 of 6)]
[im 1/10]
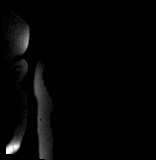

[Series 45: (id)_long_t1 · oblique · 8.0mm · 2.19mm/px · 1 of 24 slices shown]
[im 1/24]
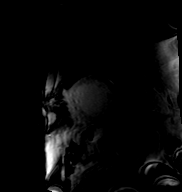

[Series 46: (id)_long_t1_moco · oblique · 8.0mm · 2.19mm/px · 1 of 24 slices shown]
[im 1/24]
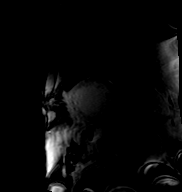

[Series 47: (id)_long_t1_moco_t1 · oblique · 8.0mm · 2.19mm/px · 1 of 6 slices shown]
[im 1/6]
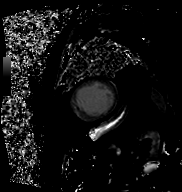

[Series 49: (id)_trufi · oblique · 8.0mm · 2.08mm/px · 1 of 9 slices shown]
[im 1/9]
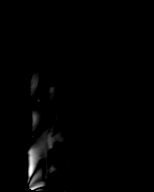

[Series 50: (id)_trufi_moco · oblique · 8.0mm · 2.08mm/px · 1 of 9 slices shown]
[im 1/9]
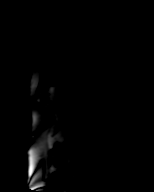

[Series 53: cine_trufi_cs_rt_short axis · oblique · 8.0mm · 1.92mm/px · 1 of 12 slices shown (1 of 10)]
[im 1/12]
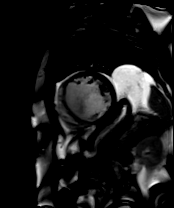

[Series 53: cine_trufi_cs_rt_short axis · oblique · 8.0mm · 1.92mm/px · 1 of 12 slices shown (2 of 10)]
[im 1/12]
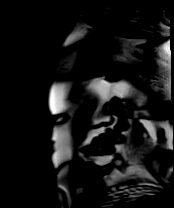

[Series 53: cine_trufi_cs_rt_short axis · oblique · 8.0mm · 1.92mm/px · 1 of 12 slices shown (3 of 10)]
[im 1/12]
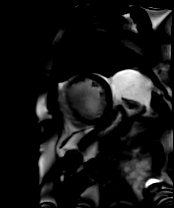

[Series 53: cine_trufi_cs_rt_short axis · oblique · 8.0mm · 1.92mm/px · 1 of 12 slices shown (4 of 10)]
[im 1/12]
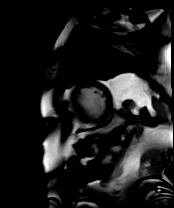

[Series 53: cine_trufi_cs_rt_short axis · oblique · 8.0mm · 1.92mm/px · 1 of 12 slices shown (5 of 10)]
[im 1/12]
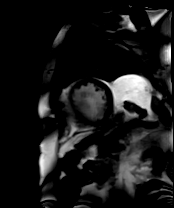

[Series 53: cine_trufi_cs_rt_short axis · oblique · 8.0mm · 1.92mm/px · 1 of 12 slices shown (6 of 10)]
[im 1/12]
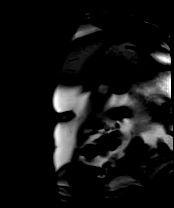

[Series 53: cine_trufi_cs_rt_short axis · oblique · 8.0mm · 1.92mm/px · 1 of 12 slices shown (7 of 10)]
[im 1/12]
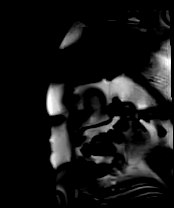

[Series 53: cine_trufi_cs_rt_short axis · oblique · 8.0mm · 1.92mm/px · 1 of 12 slices shown (8 of 10)]
[im 1/12]
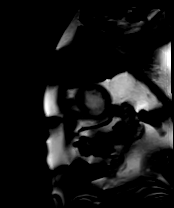

[Series 53: cine_trufi_cs_rt_short axis · oblique · 8.0mm · 1.92mm/px · 1 of 12 slices shown (9 of 10)]
[im 1/12]
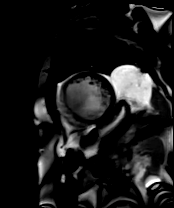

[Series 53: cine_trufi_cs_rt_short axis · oblique · 8.0mm · 1.92mm/px · 1 of 12 slices shown (10 of 10)]
[im 1/12]
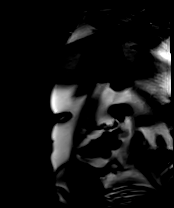

[45 of 48 positions shown; findings below may reference images not displayed]

FINDINGS: Left ventricle:

-Severely dilated

-Severe systolic dysfunction. Septal dyskinesis consistent with LBBB

-Elevated ECV (36%)

-Basal septal midwall LGE

-RV insertion site LGE

LV EF: 16% (Normal 56-78%)

Absolute volumes:

LV EDV: 532mL (Normal 77-195 mL)

LV ESV: 447mL (Normal 19-72 mL)

LV SV: 85mL (Normal 51-133 mL)

CO: 7.1L/min (Normal 2.8-8.8 L/min)

Indexed volumes:

LV EDV: 237mL/sq-m (Normal 47-92 mL/sq-m)

LV ESV: 199mL/sq-m (Normal 13-30 mL/sq-m)

LV SV: 38mL/sq-m (Normal 32-62 mL/sq-m)

CI: 3.2L/min/sq-m (Normal 1.7-4.2 L/min/sq-m)

Right ventricle: Normal size and systolic function

RV EF:  52% (Normal 47-74%)

Absolute volumes:

RV EDV: 160mL (Normal 88-227 mL)

RV ESV: 76mL (Normal 23-103 mL)

RV SV: 84mL (Normal 52-138 mL)

CO: 7.0L/min (Normal 2.8-8.8 L/min)

Indexed volumes:

RV EDV: 71mL/sq-m (Normal 55-105 mL/sq-m)

RV ESV: 34mL/sq-m (Normal 15-43 mL/sq-m)

RV SV: 37mL/sq-m (Normal 32-64 mL/sq-m)

CI: 3.1L/min/sq-m (Normal 1.7-4.2 L/min/sq-m)

Left atrium: Mild enlargement

Right atrium: Normal size

Mitral valve: Mild regurgitation

Aortic valve: Mild regurgitation

Tricuspid valve: Mild regurgitation

Pulmonic valve: Mild regurgitation

Aorta: Normal proximal ascending aorta

Pericardium: Normal
IMPRESSION: 1. Severe LV dilatation (LVEDVI 237 mL/m^2) with severe systolic
dysfunction (EF 16%). Septal dyskinesis consistent with LBBB

2.  Normal RV size and systolic function (EF 52%)

3. Basal septal midwall late gadolinium enhancement, which is a scar
pattern seen in nonischemic cardiomyopathies and associated with a
worse prognosis

4. RV insertion site LGE, which is a nonspecific scar pattern often
seen in setting of elevated pulmonary pressures

## 2021-02-17 MED ORDER — GADOBUTROL 1 MMOL/ML IV SOLN
10.0000 mL | Freq: Once | INTRAVENOUS | Status: AC | PRN
  Administered 2021-02-17: 10 mL via INTRAVENOUS

## 2021-02-17 MED ORDER — EMPAGLIFLOZIN 10 MG PO TABS
10.0000 mg | ORAL_TABLET | Freq: Every day | ORAL | Status: DC
  Administered 2021-02-17 – 2021-02-19 (×3): 10 mg via ORAL
  Filled 2021-02-17 (×3): qty 1

## 2021-02-17 MED FILL — Verapamil HCl IV Soln 2.5 MG/ML: INTRAVENOUS | Qty: 2 | Status: AC

## 2021-02-17 NOTE — Therapy (Signed)
OT Cancellation Note  Patient Details Name: Bryce Cain MRN: [id number] DOB: 22-Jul-1950   Cancelled Treatment:    Reason Eval/Treat Not Completed: OT screened, no needs identified, will sign off  [name] [name] 02/17/2021, 11:04 AM

## 2021-02-17 NOTE — TOC Initial Note (Signed)
Transition of Care [facility]) - Initial/Assessment Note    Patient Details  Name: Bryce Cain MRN: [id number] Date of Birth: [birth year]  Transition [facility]) CM/SW Contact:    Erenest Rasher, RN Phone Number: [phone] 02/17/2021, 4:54 PM  Clinical Narrative:                 HF TOC CM spoke to pt at bedside. States he was independent prior to hospital stay. PT/OT did not recommend HH. No DME needed. Will continue to follow for possible [facility] RN. Offered choice and provided Medicare list. Unit CM will arrange if [facility] needed.   Expected Discharge Plan: Home/Self Care Barriers to Discharge: Continued Medical Work up   Patient Goals and CMS Choice Patient states their goals for this hospitalization and ongoing recovery are:: patient states he wants to remain independent at home   Choice offered to / list presented to : Patient  Expected Discharge Plan and Services Expected Discharge Plan: Home/Self Care   Discharge Planning Services: CM Consult Post Acute Care Choice: [place] arrangements for the past 2 months: Single Family Home                                      Prior Living Arrangements/Services Living arrangements for the past 2 months: Single Family Home Lives with:: Spouse Patient language and need for interpreter reviewed:: Yes Do you feel safe going back to the place where you live?: Yes      Need for Family Participation in Patient Care: No (Comment) Care giver support system in place?: No (comment)   Criminal Activity/Legal Involvement Pertinent to Current Situation/Hospitalization: No - Comment as needed  Activities of Daily Living      Permission Sought/Granted Permission sought to share information with : Case Manager, Family Supports, PCP Permission granted to share information with : Yes, Verbal Permission Granted  Share Information with NAME: Bryce Cain  Permission granted to share info w AGENCY: [place]  granted to share info w Relationship: wife  Permission granted to share info w Contact Information: [phone]  Emotional Assessment Appearance:: Appears stated age Attitude/Demeanor/Rapport: Gracious Affect (typically observed): Accepting Orientation: : Oriented to Self, Oriented to Place, Oriented to  Time, Oriented to Situation   Psych Involvement: No (comment)  Admission diagnosis:  Acute CHF (congestive heart failure) (HCC) [I50.9] Acute on chronic congestive heart failure, unspecified heart failure type [facility]) [I50.9] Patient Active Problem List   Diagnosis Date Noted   Acute on chronic congestive heart failure ([place]) 02/14/2021   PCP:  Associates, [place]:   [facility] [place], [place] [address] [place] [address] [place] [address] Phone: [phone] Fax: [phone]  [facility] [address] [place] [address] Phone: [phone] Fax: [phone]     Social Determinants of Health (SDOH) Interventions    Readmission Risk Interventions No flowsheet data found.

## 2021-02-17 NOTE — Progress Notes (Signed)
Pt has declined to waer our cpap during his stay.  Pt states it is uncomfortable and will use o2 if necessary for sleep. RT will cont to monitor.

## 2021-02-17 NOTE — Progress Notes (Addendum)
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  Diuresed with IV lasix.   Weight down 2 pounds.   Feeling ok. Denies SOB.    Objective:   Weight Range: 101.9 kg Body mass index is 32.24 kg/m.   Vital Signs:   Temp:  [97.4 F (36.3 C)-97.9 F (36.6 C)] 97.8 F (36.6 C) (02/14 0857) Pulse Rate:  [0-107] 90 (02/14 0441) Resp:  [13-40] 13 (02/14 0857) BP: (102-137)/(63-88) 135/85 (02/14 0857) SpO2:  [92 %-97 %] 93 % (02/14 0857) Weight:  [101.9 kg] 101.9 kg (02/14 0025) Last BM Date : 02/15/21  Weight change: Filed Weights   02/15/21 0420 02/16/21 0600 02/17/21 0025  Weight: 102.2 kg 102.7 kg 101.9 kg    Intake/Output:   Intake/Output Summary (Last 24 hours) at 02/17/2021 0910 Last data filed at 02/17/2021 0438 Gross per 24 hour  Intake 979.58 ml  Output 1300 ml  Net -320.42 ml      Physical Exam    General:   No resp difficulty HEENT: normal Neck: supple. JVP 8-9  Carotids 2+ bilat; no bruits. No lymphadenopathy or thryomegaly appreciated. Cor: PMI nondisplaced. Regular rate & rhythm. No rubs, gallops or murmurs. Lungs: clear Abdomen: soft, nontender, nondistended. No hepatosplenomegaly. No bruits or masses. Good bowel sounds. Extremities: no cyanosis, clubbing, rash, edema Neuro: alert & orientedx3, cranial nerves grossly intact. moves all 4 extremities w/o difficulty. Affect pleasant  Telemetry: SR 80s   Labs    CBC Recent Labs    02/15/21 0333 02/16/21 0451 02/16/21 1000 02/16/21 1002 02/16/21 1004  WBC 6.5 5.9  --   --   --   HGB 12.0* 11.9*   < > 12.2* 12.6*  HCT 33.5* 32.8*   < > 36.0* 37.0*  MCV 79.8* 79.0*  --   --   --   PLT 126* 154  --   --   --    < > = values in this interval not displayed.   Basic Metabolic Panel Recent Labs     02/14/21 1747 02/15/21 0333 02/16/21 0451 02/16/21 1000 02/16/21 1002 02/16/21 1004  NA  --  140 140   < > 141 142  K  --  3.6 3.5   < > 3.5 3.5  CL  --  103 104  --   --   --   CO2  --  25 25  --   --   --   GLUCOSE  --  97 113*  --   --   --   BUN  --  17 19  --   --   --   CREATININE  --  1.51* 1.43*  --   --   --   CALCIUM  --  10.0 9.9  --   --   --   MG 1.6*  --  1.8  --   --   --    < > = values in this interval not displayed.   Liver Function Tests No results for input(s): AST, ALT, ALKPHOS, BILITOT, PROT, ALBUMIN in the last 72 hours. No results for input(s): LIPASE, AMYLASE in the last 72 hours. Cardiac Enzymes No results for input(s): CKTOTAL, CKMB, CKMBINDEX, TROPONINI in the last 72 hours.  BNP: BNP (last 3 results) Recent Labs    02/14/21  0915  BNP 707.0*    ProBNP (last 3 results) No results for input(s): PROBNP in the last 8760 hours.   D-Dimer No results for input(s): DDIMER in the last 72 hours. Hemoglobin A1C Recent Labs    02/14/21 1747  HGBA1C 5.4   Fasting Lipid Panel Recent Labs    02/15/21 0333  CHOL 137  HDL 55  LDLCALC 74  TRIG 41  CHOLHDL 2.5   Thyroid Function Tests Recent Labs    02/14/21 1747  TSH 0.662    Other results:   Imaging    CARDIAC CATHETERIZATION  Result Date: 02/16/2021   Prox RCA lesion is 30% stenosed.   1st Diag lesion is 20% stenosed.   Prox Cx to Mid Cx lesion is 20% stenosed. Findings: Ao = 102/70 (85) LV = 104/24 RA =  10 RV = 47/17 PA = 52/22 (33) PCW = 25 Fick cardiac output/index = 5.5/2.5 PVR =1.5 WU Ao sat = 98% PA sat = 65%, 66% Assessment: 1. 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 Elevated filling pressures with normal cardiac output Plan/Discussion: Suspect LBBB CM. Plan cMRI. EP to see. Continue diuresis. Of note, would not attempt cath again via right radial access given tortuous/anomalous path of right subclavian artery. Use left  radial or femoral approach. Glori Bickers, MD 10:49 AM    Medications:     Scheduled Medications:  acidophilus  1 capsule Oral Daily   aspirin EC  81 mg Oral Daily   docusate sodium  100 mg Oral BID   enoxaparin (LOVENOX) injection  40 mg Subcutaneous Q24H   insulin aspart  0-5 Units Subcutaneous QHS   insulin aspart  0-6 Units Subcutaneous TID WC   multivitamin with minerals  1 tablet Oral Daily   omega-3 acid ethyl esters  1 g Oral BID   rosuvastatin  40 mg Oral Daily   sodium chloride flush  3 mL Intravenous Q12H   sodium chloride flush  3 mL Intravenous Q12H   spironolactone  12.5 mg Oral Daily    Infusions:  sodium chloride      PRN Medications: sodium chloride, acetaminophen, nitroGLYCERIN, ondansetron (ZOFRAN) IV, sodium chloride flush    Patient Profile   71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   Assessment/Plan   1. Acute systolic HF - Echo with EF 20-25% with anterior/apical WMA suggestive of previous anterior MI. Moderate MR. 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  - Volume status improving. Give one more dose of IV lasix.  - Start jardiance 10 mg daily  - Continue spiro 12.5 mg daily  - Tomorrow start entresto 24-26 mg.  - Renal function stable.    2. DM2 -  A1c 5.4% - continue SSI  - add jardiance 10 mg daily    3. CKD 3a -BMET pending.  - unclear baseline -Add jardiance 10 mg daily - Consider SGLT2i    4. OSA - continue CPAP   5. Probable CAD - On ASA/statin - LDL 74 - Switched prava to crestor.    6. Microcytic anemia - Iron sats 15%.  - check FOBT - Replace iron if needed post MRI   7. Thrombocytopenia - follow  8. Hypokalemia - BMET pending  9. LBBB  EP consulted  with recommendations for  med optimization prior to CRT (2-3 months)   Had cMRI earlier today.   Consult cardiac rehab.   Length of Stay: 3  Amy Clegg, NP  02/17/2021, 9:10 AM  Advanced Heart Failure Team Pager [phone] (M-F; 7a - 5p)  Please contact [facility] for night-coverage after hours (5p -7a ) and weekends on [email or web address]  Patient seen with NP, agree with the above note.   Patient has diuresed well, breathing is improved.  Had a dose of Lasix IV this morning.   cMRI done this morning, will need to review.   General: NAD Neck: JVP 8 cm, no thyromegaly or thyroid nodule.  Lungs: Clear to auscultation bilaterally with normal respiratory effort. CV: Nondisplaced PMI.  Heart regular S1/S2, no S3/S4, no murmur.  No peripheral edema.   Abdomen: Soft, nontender, no hepatosplenomegaly, no distention.  Skin: Intact without lesions or rashes.  Neurologic: Alert and oriented x 3.  Psych: Normal affect. Extremities: No clubbing or cyanosis.  HEENT: Normal.   Improved, will start po diuretic tomorrow.  Add [place] today.  If BP and renal function stable tomorrow, will add Entresto.  Probably ready for home tomorrow.   Loralie Champagne 02/17/2021

## 2021-02-17 NOTE — Progress Notes (Signed)
Lab draws must be postponed until it has been 24hrs post procedure (Heart cath) since both the right and left wrist were used yesterday. MD notified. Lab will return later in the morning.

## 2021-02-17 NOTE — Progress Notes (Signed)
SATURATION QUALIFICATIONS: (This note is used to comply with regulatory documentation for home oxygen)  Patient Saturations on Room Air at Rest = 95%  Patient Saturations on Room Air while Ambulating = 94%  Patient Saturations on N/A Liters of oxygen while Ambulating = n/a  Please briefly explain why patient needs home oxygen:Pt does not need supplemental O2 at this time.  [place] Pager [phone] Office [phone]

## 2021-02-17 NOTE — Evaluation (Signed)
Physical Therapy Evaluation Patient Details Name: Bryce Cain MRN: [id number] DOB: 04/14/1950 Today's Date: 02/17/2021  History of Present Illness  The pt is a 71 yo male presenting 2/11 with SOB, upon work up, pt with elevated troponin and BNP, dx with acute CHF exacerbation. Pt nos s/p L and R heart cath on 2/13. PMH includes: OSA on CPAP, HTN, HLD, DM II,  Clinical Impression  Pt doing well with mobility and no further PT needed.  Ready for dc from PT standpoint.        Recommendations for follow up therapy are one component of a multi-disciplinary discharge planning process, led by the attending physician.  Recommendations may be updated based on patient status, additional functional criteria and insurance authorization.  Follow Up Recommendations No PT follow up    Assistance Recommended at Discharge None  Patient can return home with the following       Equipment Recommendations None recommended by PT  Recommendations for Other Services       Functional Status Assessment Patient has not had a recent decline in their functional status     Precautions / Restrictions Precautions Precautions: None      Mobility  Bed Mobility               General bed mobility comments: Pt up in chair    Transfers Overall transfer level: Independent Equipment used: None                    Ambulation/Gait Ambulation/Gait assistance: Independent Gait Distance (Feet): 200 Feet Assistive device: None Gait Pattern/deviations: WFL(Within Functional Limits) Gait velocity: normal Gait velocity interpretation: >2.62 ft/sec, indicative of community ambulatory   General Gait Details: Steady gait without difficulty. Amb on RA with SpO2 >93%  Stairs            Wheelchair Mobility    Modified Rankin (Stroke Patients Only)       Balance Overall balance assessment: No apparent balance deficits (not formally assessed)                                            Pertinent Vitals/Pain Pain Assessment Pain Assessment: No/denies pain    Home Living Family/patient expects to be discharged to:: Private residence Living Arrangements: Spouse/significant other Available Help at Discharge: Family Type of Home: House Home Access: Stairs to enter Entrance Stairs-Rails: Right Entrance Stairs-Number of Steps: 3   Home Layout: Two level;Able to live on main level with bedroom/bathroom Home Equipment: None      Prior Function Prior Level of Function : Independent/Modified Independent;Driving             Mobility Comments: No assistive device       Hand Dominance        Extremity/Trunk Assessment   Upper Extremity Assessment Upper Extremity Assessment: Overall WFL for tasks assessed    Lower Extremity Assessment Lower Extremity Assessment: Overall WFL for tasks assessed       Communication   Communication: No difficulties  Cognition Arousal/Alertness: Awake/alert Behavior During Therapy: WFL for tasks assessed/performed Overall Cognitive Status: Within Functional Limits for tasks assessed                                          General Comments  Exercises     Assessment/Plan    PT Assessment Patient does not need any further PT services  PT Problem List         PT Treatment Interventions      PT Goals (Current goals can be found in the Care Plan section)  Acute Rehab PT Goals PT Goal Formulation: All assessment and education complete, DC therapy    Frequency       Co-evaluation               AM-PAC PT "6 Clicks" Mobility  Outcome Measure Help needed turning from your back to your side while in a flat bed without using bedrails?: None Help needed moving from lying on your back to sitting on the side of a flat bed without using bedrails?: None Help needed moving to and from a bed to a chair (including a wheelchair)?: None Help needed standing up from a chair using  your arms (e.g., wheelchair or bedside chair)?: None Help needed to walk in hospital room?: None Help needed climbing 3-5 steps with a railing? : None 6 Click Score: 24    End of Session   Activity Tolerance: Patient tolerated treatment well Patient left: in chair;with call bell/phone within reach Nurse Communication: Mobility status;Other (comment) (asked to put on telemetry monitor so pt can amb independently) PT Visit Diagnosis: Other (comment)    Time: 1046-1100 PT Time Calculation (min) (ACUTE ONLY): 14 min   Charges:   PT Evaluation $PT Eval Low Complexity: [place] Pager [phone] Office [place] 02/17/2021, 11:55 AM

## 2021-02-18 LAB — BASIC METABOLIC PANEL
Anion gap: 11 (ref 5–15)
BUN: 21 mg/dL (ref 8–23)
CO2: 26 mmol/L (ref 22–32)
Calcium: 9.6 mg/dL (ref 8.9–10.3)
Chloride: 103 mmol/L (ref 98–111)
Creatinine, Ser: 1.43 mg/dL — ABNORMAL HIGH (ref 0.61–1.24)
GFR, Estimated: 52 mL/min — ABNORMAL LOW (ref >60)
Glucose, Bld: 87 mg/dL (ref 70–99)
Potassium: 3.9 mmol/L (ref 3.5–5.1)
Sodium: 140 mmol/L (ref 135–145)

## 2021-02-18 LAB — GLUCOSE, CAPILLARY
Glucose-Capillary: 87 mg/dL (ref 70–99)
Glucose-Capillary: 94 mg/dL (ref 70–99)
Glucose-Capillary: 115 mg/dL — ABNORMAL HIGH (ref 70–99)
Glucose-Capillary: 133 mg/dL — ABNORMAL HIGH (ref 70–99)

## 2021-02-18 MED ORDER — POTASSIUM CHLORIDE CRYS ER 20 MEQ PO TBCR
20.0000 meq | EXTENDED_RELEASE_TABLET | Freq: Every day | ORAL | Status: DC
  Filled 2021-02-18: qty 1

## 2021-02-18 MED ORDER — SODIUM CHLORIDE 0.9 % IV SOLN
510.0000 mg | Freq: Once | INTRAVENOUS | Status: AC
  Administered 2021-02-18: 510 mg via INTRAVENOUS
  Filled 2021-02-18: qty 17

## 2021-02-18 MED ORDER — POTASSIUM CHLORIDE CRYS ER 20 MEQ PO TBCR
40.0000 meq | EXTENDED_RELEASE_TABLET | Freq: Once | ORAL | Status: AC
  Administered 2021-02-18: 40 meq via ORAL
  Filled 2021-02-18: qty 2

## 2021-02-18 MED ORDER — TORSEMIDE 20 MG PO TABS
40.0000 mg | ORAL_TABLET | Freq: Every day | ORAL | Status: DC
  Administered 2021-02-18 – 2021-02-19 (×2): 40 mg via ORAL
  Filled 2021-02-18 (×2): qty 2

## 2021-02-18 MED ORDER — TORSEMIDE 20 MG PO TABS
20.0000 mg | ORAL_TABLET | Freq: Every day | ORAL | Status: DC
  Filled 2021-02-18: qty 1

## 2021-02-18 MED ORDER — SACUBITRIL-VALSARTAN 24-26 MG PO TABS
1.0000 | ORAL_TABLET | Freq: Two times a day (BID) | ORAL | Status: DC
  Administered 2021-02-18 – 2021-02-19 (×3): 1 via ORAL
  Filled 2021-02-18 (×3): qty 1

## 2021-02-18 NOTE — Progress Notes (Addendum)
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    Diuresed with IV lasix. IV lasix stopped 02/14.  Weight stable.  No dyspnea at rest.   Worked with PT/OT yesterday. Has not ambulated yet today.   Objective:   Weight Range: 101.7 kg Body mass index is 32.16 kg/m.   Vital Signs:   Temp:  [97.8 F (36.6 C)-98.1 F (36.7 C)] 98 F (36.7 C) (02/15 0845) Pulse Rate:  [70-94] 84 (02/15 0845) Resp:  [18-21] 20 (02/15 0845) BP: (100-135)/(71-82) 135/81 (02/15 0845) SpO2:  [92 %-98 %] 95 % (02/15 1002) Weight:  [101.7 kg] 101.7 kg (02/15 0622) Last BM Date : 02/17/21  Weight change: Filed Weights   02/17/21 0025 02/17/21 0500 02/18/21 0622  Weight: 101.9 kg 101.9 kg 101.7 kg    Intake/Output:   Intake/Output Summary (Last 24 hours) at 02/18/2021 1051 Last data filed at 02/18/2021 0755 Gross per 24 hour  Intake 472 ml  Output 900 ml  Net -428 ml      Physical Exam    General:  Well appearing. No resp difficulty HEENT: normal Neck: supple. JVP 7 cm. Carotids 2+ bilat; no bruits.  Cor: PMI nondisplaced. Regular rate & rhythm. No rubs, gallops or murmurs. Lungs: clear Abdomen: soft, nontender, nondistended.  Extremities: no cyanosis, clubbing, rash, trace edema Neuro: alert & orientedx3, cranial nerves grossly intact. moves all 4 extremities w/o difficulty. Affect pleasant   Telemetry: SR 70s-80s, variable PVCs up to 10/min  Labs    CBC Recent Labs    02/16/21 0451 02/16/21 1000 02/16/21 1004 02/17/21 0924  WBC 5.9  --   --  5.6  HGB 11.9*   < > 12.6* 12.0*  HCT 32.8*   < > 37.0* 33.2*  MCV 79.0*  --   --  79.2*  PLT 154  --   --  148*   < > = values in this interval not displayed.   Basic Metabolic Panel Recent Labs    02/16/21 0451 02/16/21 1000  02/17/21 0924 02/18/21 0433  NA 140   < > 138 140  K 3.5   < > 3.9 3.9  CL 104  --  101 103  CO2 25  --  27 26  GLUCOSE 113*  --  99 87  BUN 19  --  18 21  CREATININE 1.43*  --  1.47* 1.43*  CALCIUM 9.9  --  10.1 9.6  MG 1.8  --  2.1  --    < > = values in this interval not displayed.   Liver Function Tests No results for input(s): AST, ALT, ALKPHOS, BILITOT, PROT, ALBUMIN in the last 72 hours. No results for input(s): LIPASE, AMYLASE in the last 72 hours. Cardiac Enzymes No results for input(s): CKTOTAL, CKMB, CKMBINDEX, TROPONINI in the last 72 hours.  BNP: BNP (last 3 results) Recent Labs    02/14/21 0915  BNP 707.0*    ProBNP (last 3 results) No results for input(s): PROBNP in the last 8760 hours.   D-Dimer No results for input(s): DDIMER in the last 72 hours. Hemoglobin A1C No results for input(s): HGBA1C in the last 72 hours.  Fasting Lipid Panel No results for input(s): CHOL, HDL, LDLCALC, TRIG, CHOLHDL, LDLDIRECT in the last 72 hours.  Thyroid Function Tests No results for  input(s): TSH, T4TOTAL, T3FREE, THYROIDAB in the last 72 hours.  Invalid input(s): FREET3   Other results:   Imaging    No results found.   Medications:     Scheduled Medications:  acidophilus  1 capsule Oral Daily   aspirin EC  81 mg Oral Daily   docusate sodium  100 mg Oral BID   empagliflozin  10 mg Oral Daily   enoxaparin (LOVENOX) injection  40 mg Subcutaneous Q24H   insulin aspart  0-5 Units Subcutaneous QHS   insulin aspart  0-6 Units Subcutaneous TID WC   multivitamin with minerals  1 tablet Oral Daily   omega-3 acid ethyl esters  1 g Oral BID   rosuvastatin  40 mg Oral Daily   sodium chloride flush  3 mL Intravenous Q12H   sodium chloride flush  3 mL Intravenous Q12H   spironolactone  12.5 mg Oral Daily    Infusions:  sodium chloride      PRN Medications: sodium chloride, acetaminophen, nitroGLYCERIN, ondansetron (ZOFRAN) IV, sodium chloride  flush    Patient Profile   71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   Assessment/Plan   1. Acute systolic HF - Echo with EF 20-25% with anterior/apical WMA suggestive of previous anterior MI. Moderate MR. 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  - cMRI: LVEF 06%, RV systolic function  30%, basal septal midwall late gadolinium enhancement (scar pattern seen in NICM and associated with worse prognosis), RV insertion LGE - Variable PVCs on tele but does not appear to be high burden. Continue to monitor. May need mexiletine if > 10% burden documented - Volume okay. Start Torsemide 40 mg daily  - Continue jardiance 10 mg daily  - Continue spiro 12.5 mg daily  - Start Entresto 24/26 mg BID - Renal function stable.  - Recommend LifeVest at discharge. Will arrange. - Repeat echo in 3 months once on GDMT. If EF remains < 35%, plan for CRT-D   2. DM2 -  A1c 5.4% - continue SSI  - continue jardiance 10 mg daily    3. CKD 3a - unclear baseline - Scr 1.4-1.5 this admit, 1.43 today - continue jardiance 10 mg daily   4. OSA - continue CPAP   5. Probable CAD - On ASA/statin - LDL 74 - Switched prava to crestor.    6. Microcytic anemia - Iron sats 15%.  - FOBT pending - Already had cMRI, will give feraheme today    7. Thrombocytopenia - follow  8. Hypokalemia - K 3.9 today - Supp  9. LBBB  -EP consulted - recommend med optimization prior to CRT (2-3 months)    PT/OT evaluated - HH not necessary at discharge  Anticipate discharge home tomorrow once LifeVest placed  Length of Stay: [place], LINDSAY N, PA-C  02/18/2021, 10:51 AM  Advanced Heart Failure Team Pager [phone] (M-F; 7a - 5p)  Please contact [facility] for night-coverage  after hours (5p -7a ) and weekends on [email or web address]   Patient seen and examined with the above-signed Advanced Practice Provider and/or Housestaff. I personally reviewed laboratory data, imaging studies and relevant notes. I independently examined the patient and formulated the important aspects of the plan. I have edited the note to reflect any of my changes or salient points.  I have personally discussed the plan with the patient and/or family.  Feeling better. Denies SOB, orthopnea or PND. cMRI results reviewed with him and his brother by phone.   General:  Well appearing. No resp difficulty HEENT: normal Neck: supple. JVP 8-9 Carotids 2+ bilat; no bruits. No lymphadenopathy or thryomegaly appreciated. Cor: PMI nondisplaced. Regular rate & rhythm. No rubs, gallops or murmurs. Lungs: clear Abdomen: obese soft, nontender, nondistended. No hepatosplenomegaly. No bruits or masses. Good bowel sounds. Extremities: no cyanosis, clubbing, rash, edema Neuro: alert & orientedx3, cranial nerves grossly intact. moves all 4 extremities w/o difficulty. Affect pleasant  He has severe NICM. Suspect LBBB CM. Remains mildly volume overloaded. Continue to diurese and titrate GDMT. Explained that he will need at least 3 months of GDMT before qualifying for CRT-D. Wil lplace Lifevest in interim. Will need outpatient sleep study.  Total time spent 45 minutes. Over half that time spent discussing above.   Glori Bickers, MD  7:45 PM

## 2021-02-18 NOTE — Progress Notes (Signed)
MD on call notified that patient had 8 beats of V tach bp 110/63 Hr 81 asymptomatic Arthor Captain LPN

## 2021-02-18 NOTE — Care Management Important Message (Signed)
Important Message  Patient Details  Name: Bryce Cain MRN: [id number] Date of Birth: 1950/01/09   Medicare Important Message Given:  Yes     [name] 02/18/2021, 9:43 AM

## 2021-02-19 ENCOUNTER — Other Ambulatory Visit (HOSPITAL_COMMUNITY): Payer: Self-pay

## 2021-02-19 LAB — GLUCOSE, CAPILLARY
Glucose-Capillary: 101 mg/dL — ABNORMAL HIGH (ref 70–99)
Glucose-Capillary: 136 mg/dL — ABNORMAL HIGH (ref 70–99)
Glucose-Capillary: 121 mg/dL — ABNORMAL HIGH (ref 70–99)

## 2021-02-19 LAB — BASIC METABOLIC PANEL
Anion gap: 13 (ref 5–15)
BUN: 17 mg/dL (ref 8–23)
CO2: 27 mmol/L (ref 22–32)
Calcium: 10 mg/dL (ref 8.9–10.3)
Chloride: 100 mmol/L (ref 98–111)
Creatinine, Ser: 1.4 mg/dL — ABNORMAL HIGH (ref 0.61–1.24)
GFR, Estimated: 53 mL/min — ABNORMAL LOW (ref >60)
Glucose, Bld: 98 mg/dL (ref 70–99)
Potassium: 3.6 mmol/L (ref 3.5–5.1)
Sodium: 140 mmol/L (ref 135–145)

## 2021-02-19 LAB — MAGNESIUM: Magnesium: 2.1 mg/dL (ref 1.7–2.4)

## 2021-02-19 MED ORDER — SACUBITRIL-VALSARTAN 24-26 MG PO TABS
1.0000 | ORAL_TABLET | Freq: Two times a day (BID) | ORAL | 1 refills | Status: DC
  Filled 2021-02-19: qty 60, 30d supply, fill #0

## 2021-02-19 MED ORDER — EMPAGLIFLOZIN 10 MG PO TABS
10.0000 mg | ORAL_TABLET | Freq: Every day | ORAL | 1 refills | Status: DC
  Filled 2021-02-19: qty 30, 30d supply, fill #0

## 2021-02-19 MED ORDER — POTASSIUM CHLORIDE CRYS ER 20 MEQ PO TBCR
EXTENDED_RELEASE_TABLET | ORAL | 1 refills | Status: DC
  Filled 2021-02-19: qty 15, 45d supply, fill #0

## 2021-02-19 MED ORDER — FUROSEMIDE 40 MG PO TABS
ORAL_TABLET | ORAL | 1 refills | Status: DC
  Filled 2021-02-19: qty 15, 45d supply, fill #0

## 2021-02-19 MED ORDER — POTASSIUM CHLORIDE CRYS ER 20 MEQ PO TBCR
40.0000 meq | EXTENDED_RELEASE_TABLET | Freq: Once | ORAL | Status: AC
  Administered 2021-02-19: 40 meq via ORAL
  Filled 2021-02-19: qty 2

## 2021-02-19 MED ORDER — ROSUVASTATIN CALCIUM 40 MG PO TABS
40.0000 mg | ORAL_TABLET | Freq: Every day | ORAL | 1 refills | Status: DC
  Filled 2021-02-19: qty 30, 30d supply, fill #0

## 2021-02-19 MED ORDER — SPIRONOLACTONE 25 MG PO TABS
12.5000 mg | ORAL_TABLET | Freq: Every day | ORAL | 1 refills | Status: DC
  Filled 2021-02-19: qty 16, 32d supply, fill #0

## 2021-02-19 NOTE — Discharge Summary (Addendum)
Advanced Heart Failure Team  Discharge Summary   Patient ID: Bryce Cain MRN: [id number], DOB/AGE: [birth year] 71 y.o. 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 BNP 707. CXR with pulmonary edema. Initially required BIPAP. ECG with sinus tach and wide LBBB (QRS 181 ms). Echo with EF 20-25% with anterior/apical WMA suggestive of previous anterior MI. Moderate MR. RV normal. R/LHC with minimal nonobstructive CAD with separate left coronary ostia, elevated filling pressures and normal CO. He was diuresed with IV lasix and started on GDMT. Discharged home with po lasix 40 mg twice a week.   cMRI demonstrated LVEF 16%, RVEF 52%, LGE basal septal midwall (scar pattern seen with NICM). Cardiomyopathy felt to be likely due to LBBB. EP consulted. Recommended optimal medical therapy for 2-3 months. If EF remained persistently reduced, plan for CRT-D.   Had PVCs and NSVT on telemetry. LifeVest placed prior to discharge.   Has f/u in HF clinic scheduled on 02/27. Will need CBC and BMET same day.    Detailed Hospital Course by Problem: 1. Acute systolic HF - Echo with EF 20-25% with anterior/apical WMA suggestive of previous anterior MI. Moderate MR. 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  - cMRI: LVEF 96%, RV systolic function  29%, basal septal midwall late gadolinium enhancement (scar pattern seen in NICM and associated with worse prognosis), RV insertion LGE  - Volume okay. Lasix 40 mg twice a week (M + F) with 20 mEq KCL. - Continue jardiance 10 mg daily  -  Continue spiro 12.5 mg daily  - Continue Entresto 24/26 mg BID - PVCs + NSVT on tele. LifeVest arranged. - Repeat echo in 3 months once on GDMT. If EF remains < 35%, plan for CRT-D - BMET and CBC at f/u visit   2. DM2 -  A1c 5.4% - continue jardiance 10 mg daily  - stopped janumet at discharge   3. CKD 3a - unclear baseline - Scr 1.4-1.5 this admit, 1.40 today - continue jardiance 10 mg daily   4. OSA - awaiting new CPAP machine   5. Minimal non-obstructive CAD - On ASA/statin - LDL 74 - Switched prava to crestor.    6. Microcytic anemia - Iron sats 15%.  - FOBT pending - received feraheme 02/15 - Hgb 12 on 02/14 - consider outpatient GI w/u   7. Thrombocytopenia - platelets 148 on 02/14   8. Hypokalemia - K 3.6 today - Supplemented prior to discharge   9. LBBB  -EP consulted - recommend med optimization and repeat echo. CRT if EF remains < or = 35% after 3 months -Has EP f/u scheduled   10. PVCs/NSVT -~ 5 PVCs/min on tele, 1 8 beat run NSVT overnight - Supp'd K today. Mag okay. - LifeVest at discharge as above  Discharge Weight Range: 237 lb >> 219 lb  Discharge Vitals: Blood pressure 99/62, pulse 82, temperature 98 F (36.7 C), temperature source Oral, resp. rate 20, height 5\' 10"  (1.778 m), weight 99.4 kg, SpO2 94 %.  Labs: Lab Results  Component Value Date   WBC 5.6 02/17/2021  HGB 12.0 (L) 02/17/2021   HCT 33.2 (L) 02/17/2021   MCV 79.2 (L) 02/17/2021   PLT 148 (L) 02/17/2021    Recent Labs  Lab 02/19/21 0504  NA 140  K 3.6  CL 100  CO2 27  BUN 17  CREATININE 1.40*  CALCIUM 10.0  GLUCOSE 98   Lab Results  Component Value Date   CHOL 137 02/15/2021   HDL 55 02/15/2021   LDLCALC 74 02/15/2021   TRIG 41 02/15/2021   BNP (last 3 results) Recent Labs    02/14/21 0915  BNP 707.0*    ProBNP (last 3 results) No results for input(s): PROBNP in the last 8760 hours.   Diagnostic Studies/Procedures   cMRI  02/17/21: IMPRESSION: 1. Severe LV dilatation (LVEDVI 237 mL/m^2) with severe systolic dysfunction (EF 50%). Septal dyskinesis consistent with LBBB   2.  Normal RV size and systolic function (EF 93%)   3. Basal septal midwall late gadolinium enhancement, which is a scar pattern seen in nonischemic cardiomyopathies and associated with a worse prognosis   4. RV insertion site LGE, which is a nonspecific scar pattern often seen in setting of elevated pulmonary pressures   R/LHC, 02/16/2021:   Prox RCA lesion is 30% stenosed.   1st Diag lesion is 20% stenosed.   Prox Cx to Mid Cx lesion is 20% stenosed.   Findings:   Ao = 102/70 (85) LV = 104/24 RA =  10 RV = 47/17 PA = 52/22 (33) PCW = 25 Fick cardiac output/index = 5.5/2.5 PVR =1.5 WU Ao sat = 98% PA sat = 65%, 66%   Assessment: 1. 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 Elevated filling pressures with normal cardiac output     Echo, 02/15/21 IMPRESSIONS  1. Left ventricular ejection fraction, by estimation, is 20 to 25%. The left ventricle has severely decreased function. The left ventricle demonstrates regional wall motion abnormalities (see scoring diagram/findings for description). There is mild concentric left ventricular hypertrophy. Left ventricular diastolic function could not be evaluated. There is akinesis of the left ventricular, mid inferoseptal wall and anteroseptal wall. There is akinesis of the left ventricular, apical septal wall, inferior wall and inferolateral wall. There is akinesis of the left ventricular, entire apical segment and anterior wall.   2. Right ventricular systolic function is normal. The right ventricular size is normal. Tricuspid regurgitation signal is inadequate for assessing PA pressure.   3. Left atrial size was severely dilated.   4. The mitral valve is degenerative. Mild to moderate mitral valve regurgitation. No evidence  of mitral stenosis.   5. The aortic valve is tricuspid. Aortic valve regurgitation is mild. Aortic valve sclerosis/calcification is present, without any evidence of aortic stenosis.   6. Pulmonic valve regurgitation is moderate.   7. Aortic dilatation noted. There is mild dilatation of the aortic root, measuring 38 mm.   8. The inferior vena cava is dilated in size with >50% respiratory variability, suggesting right atrial pressure of 8 mmHg.      Discharge Medications   Allergies as of 02/19/2021   No Known Allergies      Medication List     STOP taking these medications    amLODipine 10 MG tablet Commonly known as: NORVASC   Janumet 50-500 MG tablet Generic drug: sitaGLIPtin-metformin   pravastatin 10 MG tablet Commonly known as: PRAVACHOL   valsartan-hydrochlorothiazide 320-12.5 MG tablet Commonly known as: DIOVAN-HCT  TAKE these medications    acidophilus Caps capsule Take 1 capsule by mouth daily.   aspirin EC 81 MG tablet Take 81 mg by mouth daily. Swallow whole.   docusate sodium 100 MG capsule Commonly known as: COLACE Take 100 mg by mouth 2 (two) times daily.   Entresto 24-26 MG Generic drug: sacubitril-valsartan Take 1 tablet by mouth 2 (two) times daily.   Fish Oil 1000 MG Caps Take 1,000 mg by mouth daily.   furosemide 40 MG tablet Commonly known as: Lasix Take 1 tablet twice a week (Monday and Friday)   Jardiance 10 MG Tabs tablet Generic drug: empagliflozin Take 1 tablet (10 mg total) by mouth daily. Start taking on: February 20, 2021   multivitamin with minerals tablet Take 1 tablet by mouth daily.   potassium chloride SA 20 MEQ tablet Commonly known as: KLOR-CON M Take 1 tablet twice a week with furosemide (Monday and Friday)   rosuvastatin 40 MG tablet Commonly known as: CRESTOR Take 1 tablet (40 mg total) by mouth daily. Start taking on: February 20, 2021   spironolactone 25 MG tablet Commonly known as: ALDACTONE Take  1/2 tablet (12.5 mg total) by mouth daily. Start taking on: February 20, 2021               Durable Medical Equipment  (From admission, onward)           Start     Ordered   02/19/21 6599  For home use only DME Vest life vest  Once       Comments: EF < 35%, NSVT, 3 months duration   02/19/21 3570            Disposition   The patient will be discharged in stable condition to home. Discharge Instructions     (HEART FAILURE PATIENTS) Call MD:  Anytime you have any of the following symptoms: 1) 3 pound weight gain in 24 hours or 5 pounds in 1 week 2) shortness of breath, with or without a dry hacking cough 3) swelling in the hands, feet or stomach 4) if you have to sleep on extra pillows at night in order to breathe.   Complete by: As directed    Diet - low sodium heart healthy   Complete by: As directed    Heart Failure patients record your daily weight using the same scale at the same time of day   Complete by: As directed    Increase activity slowly   Complete by: As directed        Follow-up Information     [facility] [place] Follow up on 03/02/2021.   Specialty: Cardiology Why: Advanced Heart Failure Clinic at [facility] at 9:30 am Entrance C, Garage Code 1204 Contact information: [address] [id number] [place], [place] on 03/10/2021.   Specialty: Rheumatology Why: @3 :00pm Contact information: [place] [address] [phone]         Vickie Epley, MD Follow up on 05/19/2021.   Specialties: Cardiology, Radiology Why: 10:45 am Contact information: [place] [address] [phone]                   Duration of Discharge Encounter: Greater than 35 minutes   Signed, [facility], LINDSAY N  02/19/2021, 4:56 PM   Agree with above. Suspect severe LBBB cardiomyopathy.  Seen  by EP and will plan for CRT-D if EF not improving with 3 months of GDMT. LifeVest placed.  Glori Bickers, MD  12:32 PM

## 2021-02-19 NOTE — Plan of Care (Signed)

## 2021-02-19 NOTE — TOC Transition Note (Signed)
Transition of Care [facility]) - CM/SW Discharge Note   Patient Details  Name: Bryce Cain MRN: [id number] Date of Birth: 1950-09-03  [facility]) CM/SW Contact:  Zenon Mayo, RN Phone Number: 02/19/2021, 2:59 PM   Clinical Narrative:    Patient is for dc, lifevest will be delivered to room shortly per [facility] with [employer].    Final next level of care: Home/Self Care Barriers to Discharge:  (waiting for life vest to be fitted.)   Patient Goals and CMS Choice Patient states their goals for this hospitalization and ongoing recovery are:: patient states he wants to remain independent at home   Choice offered to / list presented to : NA  Discharge Placement                       Discharge Plan and Services   Discharge Planning Services: CM Consult Post Acute Care Choice: Home Health          DME Arranged: Life vest DME Agency: [employer] Date DME Agency Contacted: 02/18/21 Time DME Agency Contacted: 1600 Representative spoke with at DME Agency: Loma Sousa HH Arranged: NA          Social Determinants of Health ([place]) Interventions     Readmission Risk Interventions No flowsheet data found.

## 2021-02-19 NOTE — Progress Notes (Addendum)
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   Feeling well. No dyspnea or LE edema. Has been up walking.   Scr stable at 1.4.  Notes some nausea after am medications.  BP soft  Down 5 lb overnight. Brisk diuresis yesterday.   Objective:   Weight Range: 99.4 kg Body mass index is 31.44 kg/m.   Vital Signs:   Temp:  [97.8 F (36.6 C)-98.3 F (36.8 C)] 98 F (36.7 C) (02/16 0700) Pulse Rate:  [74-85] 79 (02/16 0700) Resp:  [18-21] 18 (02/16 0700) BP: (98-148)/(65-85) 99/72 (02/16 0700) SpO2:  [93 %-98 %] 95 % (02/16 0700) Weight:  [99.4 kg] 99.4 kg (02/16 0500) Last BM Date : 02/17/21  Weight change: Filed Weights   02/17/21 0500 02/18/21 0622 02/19/21 0500  Weight: 101.9 kg 101.7 kg 99.4 kg    Intake/Output:   Intake/Output Summary (Last 24 hours) at 02/19/2021 0952 Last data filed at 02/19/2021 0857 Gross per 24 hour  Intake 0 ml  Output 2900 ml  Net -2900 ml      Physical Exam    General: No distress. Lying in bed. HEENT: normal Neck: supple. No JVD. Carotids 2+ bilat; no bruits.  Cor: PMI nondisplaced. Regular rate & rhythm. No rubs, gallops or murmurs. Lungs: clear Abdomen: soft, nontender, nondistended. No hepatosplenomegaly.  Extremities: no cyanosis, clubbing, rash, trace edema Neuro: alert & orientedx3, cranial nerves grossly intact. moves all 4 extremities w/o difficulty. Affect pleasant    Telemetry: SR 80s, ~ 5 PVCs/min, 1 8 beat run NSVT overnight  Labs    CBC Recent Labs    02/16/21 1004 02/17/21 0924  WBC  --  5.6  HGB 12.6* 12.0*  HCT 37.0* 33.2*  MCV  --  79.2*  PLT  --  027*   Basic Metabolic Panel Recent Labs    02/17/21 0924 02/18/21 0433 02/19/21 0504  NA 138 140 140  K 3.9 3.9 3.6  CL 101 103 100  CO2 27 26  27   GLUCOSE 99 87 98  BUN 18 21 17   CREATININE 1.47* 1.43* 1.40*  CALCIUM 10.1 9.6 10.0  MG 2.1  --  2.1   Liver Function Tests No results for input(s): AST, ALT, ALKPHOS, BILITOT, PROT, ALBUMIN in the last 72 hours. No results for input(s): LIPASE, AMYLASE in the last 72 hours. Cardiac Enzymes No results for input(s): CKTOTAL, CKMB, CKMBINDEX, TROPONINI in the last 72 hours.  BNP: BNP (last 3 results) Recent Labs    02/14/21 0915  BNP 707.0*    ProBNP (last 3 results) No results for input(s): PROBNP in the last 8760 hours.   D-Dimer No results for input(s): DDIMER in the last 72 hours. Hemoglobin A1C No results for input(s): HGBA1C in the last 72 hours.  Fasting Lipid Panel No results for input(s): CHOL, HDL, LDLCALC, TRIG, CHOLHDL, LDLDIRECT in the last 72 hours.  Thyroid Function Tests No results for input(s): TSH, T4TOTAL, T3FREE, THYROIDAB in the last 72 hours.  Invalid input(s): FREET3   Other results:   Imaging    No results found.   Medications:     Scheduled Medications:  acidophilus  1 capsule Oral Daily   aspirin EC  81 mg Oral Daily   docusate sodium  100 mg Oral BID   empagliflozin  10 mg  Oral Daily   enoxaparin (LOVENOX) injection  40 mg Subcutaneous Q24H   insulin aspart  0-5 Units Subcutaneous QHS   insulin aspart  0-6 Units Subcutaneous TID WC   multivitamin with minerals  1 tablet Oral Daily   omega-3 acid ethyl esters  1 g Oral BID   rosuvastatin  40 mg Oral Daily   sacubitril-valsartan  1 tablet Oral BID   sodium chloride flush  3 mL Intravenous Q12H   sodium chloride flush  3 mL Intravenous Q12H   spironolactone  12.5 mg Oral Daily   torsemide  40 mg Oral Daily    Infusions:  sodium chloride      PRN Medications: sodium chloride, acetaminophen, nitroGLYCERIN, ondansetron (ZOFRAN) IV, sodium chloride flush    Patient Profile   71 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   Assessment/Plan   1. Acute systolic HF - Echo with EF 20-25% with anterior/apical WMA suggestive of previous anterior MI. Moderate MR. 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  - cMRI: LVEF 32%, RV systolic function  67%, basal septal midwall late gadolinium enhancement (scar pattern seen in NICM and associated with worse prognosis), RV insertion LGE  - Volume okay. Down 5 lb overnight. BP soft. Some nausea after am meds. ? If getting dry. Will stop torsemide today (already received this am). Will likely need PRN diuretic at discharge. - Continue jardiance 10 mg daily  - Continue spiro 12.5 mg daily  - Continue Entresto 24/26 mg BID - PVCs + NSVT on tele. Recommend LifeVest at discharge. Will arrange. - Repeat echo in 3 months once on GDMT. If EF remains < 35%, plan for CRT-D   2. DM2 -  A1c 5.4% - continue SSI  - continue jardiance 10 mg daily    3. CKD 3a - unclear baseline - Scr 1.4-1.5 this admit, 1.40 today - continue jardiance 10 mg daily   4. OSA - continue CPAP   5. Minimal non-obstructive CAD - On ASA/statin - LDL 74 - Switched prava to crestor.    6. Microcytic anemia - Iron sats 15%.  - FOBT pending - received feraheme 02/15 - consider outpatient GI w/u   7. Thrombocytopenia - follow  8. Hypokalemia - K 3.6 today - Supp  9. LBBB  -EP consulted - recommend med optimization and repeat echo. CRT if EF remains < or = 35% after 3 months  10. PVCs/NSVT -~ 5 PVCs/min on tele, 1 8 beat run NSVT overnight - Supp K today. Mag okay. - LifeVest at discharge as above   PT/OT evaluated - HH not necessary at discharge  Anticipate discharge home once LifeVest placed  Length of Stay: [place], [place], PA-C  02/19/2021, 9:52 AM  Advanced Heart Failure Team Pager [phone]  (M-F; 7a - 5p)  Please contact [facility] for night-coverage after hours (5p -7a ) and weekends on [email or web address]  Patient seen and examined with the above-signed Advanced Practice Provider and/or Housestaff. I personally reviewed laboratory data, imaging studies and relevant notes. I independently examined the patient and formulated the important aspects of the plan. I have edited the note to reflect any of my changes or salient points. I have personally discussed the plan with the patient and/or family.  Diuresed well. Feeling better except  for some nausea after meds.   General:  Sitting up in bed  No resp difficulty HEENT: normal Neck: supple. no JVD. Carotids 2+ bilat; no bruits. No lymphadenopathy or thryomegaly appreciated. Cor: PMI nondisplaced. Regular rate & rhythm. No rubs, gallops or murmurs. Lungs: clear Abdomen: obese soft, nontender, nondistended. No hepatosplenomegaly. No bruits or masses. Good bowel sounds. Extremities: no cyanosis, clubbing, rash, edema Neuro: alert & orientedx3, cranial nerves grossly intact. moves all 4 extremities w/o difficulty. Affect pleasant  I think he is table to go home today if/when Lifevest placed.   Agree with current meds would add lasix 40 + K 20 on Monday and Friday.   Glori Bickers, MD  11:22 AM

## 2021-02-19 NOTE — TOC Progression Note (Addendum)
Transition of Care [facility]) - Progression Note    Patient Details  Name: Bryce Cain MRN: [id number] Date of Birth: [birth year]  Transition of Care [facility]) CM/SW Contact  Keric Zehren, LCSW Phone Number: 02/19/2021, 11:53 AM  Clinical Narrative:    HF CSW heard from the heart failure Cardiologist, Ria Comment about [name] needing a Lifevest before he can discharge. CSW reached out to the unit CM, Neoma Laming for assistance with the lifevest that is needed before [name] can discharge. RNCM, Neoma Laming reported that she faxed the information yesterday and sent what was faxed to [facility] and has already spoken to the [employer].    CSW will continue to follow throughout discharge.   Expected Discharge Plan: Home/Self Care Barriers to Discharge: Continued Medical Work up  Expected Discharge Plan and Services Expected Discharge Plan: Home/Self Care   Discharge Planning Services: CM Consult Post Acute Care Choice: [place] arrangements for the past 2 months: Single Family Home                                       Social Determinants of Health (SDOH) Interventions    Readmission Risk Interventions No flowsheet data found.  Charish Schroepfer, MSW, LCSW [phone] Heart Failure Social Worker

## 2021-02-20 ENCOUNTER — Other Ambulatory Visit (HOSPITAL_COMMUNITY): Payer: Self-pay

## 2021-02-25 NOTE — Progress Notes (Signed)
ADVANCED HF CLINIC CONSULT NOTE   Primary Care: [facility] Medical HF Cardiologist: Dr. 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   Admitted 2/23 with new acute systolic heart failure. Echo with EF 20-25% with anterior/apical WMA suggestive of previous anterior MI. Moderate MR. RV normal. Underwent R/LHC showing minimal nonobstructive CAD with separate left coronary ostia, elevated filling pressures and normal CO. cMRI demonstrated LVEF 16%, RVEF 52%, LGE basal septal midwall (scar pattern seen with NICM). Cardiomyopathy felt to be likely due to LBBB. EP consulted. He was diuresed with IV lasix and started on GDMT. He had PVCs and NSVT and discharged home with LifeVest, weight 219 lbs.  Today he returns for post hospital HF follow up with his wife. He is wearing his CPAP but it is malfunctioning. He is fatigued and has mild dyspnea on exertional. He has occasional positional dizziness. Denies palpitations, CP, edema, or PND/Orthopnea. Appetite ok. No fever or chills. He is not weighing at home, waiting on new scale. Taking all medications. He has refused to continue to wear his LifeVest. Eats a lot of red meat.  Cardiac Studies: - Echo (2/23): EF 20-25% with anterior/apical WMA suggestive of previous anterior MI. Moderate MR. RV normal    - R/LHC (2/23):   Prox RCA lesion is 30% stenosed.   1st Diag lesion is 20% stenosed.   Prox Cx to Mid Cx lesion is 20% stenosed.  Ao = 102/70 (85) LV = 104/24 RA =  10 RV = 47/17 PA = 52/22 (33) PCW = 25 Fick cardiac output/index = 5.5/2.5 PVR =1.5 WU Ao sat = 98% PA sat = 65%, 66%   Assessment: 1. 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 Elevated filling pressures with normal cardiac output   - cMRI (2/23): LVEF 16%, RVEF 52%, LGE basal septal midwall (scar pattern seen with NICM).     Review of Systems: [y] = yes, [ ]  = no   General: Weight gain [ ] ; Weight loss [ ] ; Anorexia [ ] ; Fatigue Blue.Reese ]; Fever [ ] ; Chills [ ] ; Weakness [ ]   Cardiac: Chest pain/pressure [ ] ; Resting SOB [ ] ; Exertional SOB Blue.Reese ]; Orthopnea [ ] ; Pedal Edema [ ] ; Palpitations [y]; Syncope [ ] ; Presyncope [ ] ; Paroxysmal nocturnal dyspnea[ ]   Pulmonary: Cough [ ] ; Wheezing[ ] ; Hemoptysis[ ] ; Sputum [ ] ; Snoring [y]  GI: Vomiting[ ] ; Dysphagia[ ] ; Melena[ ] ; Hematochezia [ ] ; Heartburn[ ] ; Abdominal pain [ ] ; Constipation [ ] ; Diarrhea [ ] ; BRBPR [ ]   GU: Hematuria[ ] ; Dysuria [ ] ; Nocturia[ ]   Vascular: Pain in legs with walking [ ] ; Pain in feet with lying flat [ ] ; Non-healing sores [ ] ; Stroke [ ] ; TIA [ ] ; Slurred speech [ ] ;  Neuro: Headaches[ ] ; Vertigo[ ] ; Seizures[ ] ; Paresthesias[ ] ;Blurred vision [ ] ; Diplopia [ ] ; Vision changes [ ]   Ortho/Skin: Arthritis [ ] ; Joint pain [ ] ; Muscle pain [ ] ; Joint swelling [ ] ; Back Pain [ ] ; Rash [ ]   Psych: Depression[ ] ; Anxiety[ ]   Heme: Bleeding problems [ ] ; Clotting disorders [ ] ; Anemia [ ]   Endocrine: Diabetes Blue.Reese ]; Thyroid dysfunction[ ]   Past Medical History:  Diagnosis Date   DM (diabetes mellitus), type 2 ([place])    OSA on CPAP    Current Outpatient Medications  Medication Sig Dispense Refill  acidophilus (RISAQUAD) CAPS capsule Take 1 capsule by mouth daily.     aspirin EC 81 MG tablet Take 81 mg by mouth daily. Swallow whole.     docusate sodium (COLACE) 100 MG capsule Take 100 mg by mouth 2 (two) times daily.     empagliflozin (JARDIANCE) 10 MG TABS tablet Take 1 tablet (10 mg total) by mouth daily. 30 tablet 1   furosemide (LASIX) 40 MG tablet Take 1 tablet twice a week (Monday and Friday) 15 tablet 1   Multiple Vitamins-Minerals (MULTIVITAMIN WITH MINERALS) tablet Take 1 tablet by mouth daily.     Omega-3 Fatty Acids (FISH OIL) 1000 MG CAPS Take 1,000 mg by mouth daily.     potassium chloride SA (KLOR-CON M) 20 MEQ tablet Take 1  tablet twice a week with furosemide (Monday and Friday) 15 tablet 1   rosuvastatin (CRESTOR) 40 MG tablet Take 1 tablet (40 mg total) by mouth daily. 30 tablet 1   sacubitril-valsartan (ENTRESTO) 24-26 MG Take 1 tablet by mouth 2 (two) times daily. 60 tablet 1   spironolactone (ALDACTONE) 25 MG tablet Take 1/2 tablet (12.5 mg total) by mouth daily. 16 tablet 1   No current facility-administered medications for this encounter.   No Known Allergies  Social History   Socioeconomic History   Marital status: Married    Spouse name: Not on file   Number of children: Not on file   Years of education: Not on file   Highest education level: Not on file  Occupational History   Not on file  Tobacco Use   Smoking status: Former    Types: Pipe    Quit date: 02/05/1991    Years since quitting: 30.0   Smokeless tobacco: Not on file  Substance and Sexual Activity   Alcohol use: Not on file   Drug use: Not on file   Sexual activity: Not on file  Other Topics Concern   Not on file  Social History Narrative   Not on file   Social Determinants of Health   Financial Resource Strain: Not on file  Food Insecurity: Not on file  Transportation Needs: Not on file  Physical Activity: Not on file  Stress: Not on file  Social Connections: Not on file  Intimate Partner Violence: Not on file   Family History  Problem Relation Age of Onset   Heart attack Father    Wt Readings from Last 3 Encounters:  03/02/21 102.5 kg  02/19/21 99.4 kg   BP 98/60    Pulse 72    Wt 102.5 kg    SpO2 99%    BMI 32.43 kg/m   PHYSICAL EXAM: General:  NAD. No resp difficulty HEENT: Normal Neck: Supple. No JVD. Carotids 2+ bilat; no bruits. No lymphadenopathy or thryomegaly appreciated. Cor: PMI nondisplaced. Regular rate & rhythm. No rubs, gallops or murmurs. Lungs: Clear Abdomen: Obese, nontender, nondistended. No hepatosplenomegaly. No bruits or masses. Good bowel sounds. Extremities: No cyanosis, clubbing,  rash, edema Neuro: Alert & oriented x 3, cranial nerves grossly intact. Moves all 4 extremities w/o difficulty. Affect pleasant.  ECG: NSR LBBB with PVC 79 bpm (personally reviewed).  ASSESSMENT & PLAN: 1. Chronic systolic HF - Echo with EF 20-25% with anterior/apical WMA suggestive of previous anterior MI. Moderate MR. RV normal  LBBB may also be contributing. 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  - cMRI: LVEF  83%, RV systolic function  41%, basal septal midwall late gadolinium enhancement (scar pattern seen in NICM and associated with worse prognosis), RV insertion LGE  - NYHA II, volume looks good today. No BP room to titrate GDMT. - Continue Lasix 40 mg + 20 KCL on Mondays and Fridays. - Continue Jardiance 10 mg daily.  - Continue spiro 12.5 mg daily.  - Continue Entresto 24/26 mg bid. - We discussed rationale for LifeVest and I encouraged him to re-consider wearing. - Repeat echo in 3 months once on GDMT. If EF remains < 35%, plan for CRT-D. - Labs today.   2. PVCs/NSVT - Check Mag and BMET today. - See above regarding LifeVest. - Continue CPAP.   3. DM2 - A1c 5.4% - On SGLT2i.   4. CKD 3a - Unclear baseline - Continue Jardiance. - BMET today.   5. OSA - Continue CPAP. - Needs to follow up with Dr. Maxwell Caul at [facility].   6. Minimal non-obstructive CAD - On ASA/statin. - LDL 74.   7. LBBB  - Optimize meds and repeat echo. CRT if EF remains < or = 35% after 3 months   8. Microcytic anemia - Received feraheme during admit. - Discussed further work up with PCP or GI.    9. H/o thrombocytopenia - Plt up to 148 on discharge. - CBC today.   Follow up with APP in 4-6 weeks (add beta blocker) and Dr. Haroldine Laws in 12 week + echo.  Allena Katz, FNP-BC 03/02/21  Greater than 50% of the (total minutes 35) visit spent in  counseling/coordination of care regarding (pathophysiology of heart failure, OSA, medications and dietary guidelines).

## 2021-03-02 ENCOUNTER — Ambulatory Visit (HOSPITAL_COMMUNITY)
Admission: RE | Admit: 2021-03-02 | Discharge: 2021-03-02 | Disposition: A | Discharge status: Home / Self Care | Payer: Medicare PPO | Source: Ambulatory Visit | Attending: Family Medicine | Admitting: Family Medicine

## 2021-03-02 ENCOUNTER — Encounter (HOSPITAL_COMMUNITY): Payer: Self-pay

## 2021-03-02 ENCOUNTER — Other Ambulatory Visit: Payer: Self-pay

## 2021-03-02 VITALS — BP 98/60 | HR 72 | Wt 226.0 lb

## 2021-03-02 DIAGNOSIS — Z79899 Other long term (current) drug therapy: Secondary | ICD-10-CM | POA: Insufficient documentation

## 2021-03-02 DIAGNOSIS — N1831 Chronic kidney disease, stage 3a: Secondary | ICD-10-CM | POA: Diagnosis not present

## 2021-03-02 DIAGNOSIS — I447 Left bundle-branch block, unspecified: Secondary | ICD-10-CM | POA: Insufficient documentation

## 2021-03-02 DIAGNOSIS — I2510 Atherosclerotic heart disease of native coronary artery without angina pectoris: Secondary | ICD-10-CM | POA: Diagnosis not present

## 2021-03-02 DIAGNOSIS — I5022 Chronic systolic (congestive) heart failure: Secondary | ICD-10-CM

## 2021-03-02 DIAGNOSIS — N183 Chronic kidney disease, stage 3 unspecified: Secondary | ICD-10-CM

## 2021-03-02 DIAGNOSIS — I130 Hypertensive heart and chronic kidney disease with heart failure and stage 1 through stage 4 chronic kidney disease, or unspecified chronic kidney disease: Secondary | ICD-10-CM | POA: Diagnosis not present

## 2021-03-02 DIAGNOSIS — G4733 Obstructive sleep apnea (adult) (pediatric): Secondary | ICD-10-CM | POA: Diagnosis not present

## 2021-03-02 DIAGNOSIS — I493 Ventricular premature depolarization: Secondary | ICD-10-CM

## 2021-03-02 DIAGNOSIS — D509 Iron deficiency anemia, unspecified: Secondary | ICD-10-CM | POA: Insufficient documentation

## 2021-03-02 DIAGNOSIS — D696 Thrombocytopenia, unspecified: Secondary | ICD-10-CM | POA: Diagnosis not present

## 2021-03-02 DIAGNOSIS — Z87891 Personal history of nicotine dependence: Secondary | ICD-10-CM | POA: Diagnosis not present

## 2021-03-02 DIAGNOSIS — Z7984 Long term (current) use of oral hypoglycemic drugs: Secondary | ICD-10-CM | POA: Diagnosis not present

## 2021-03-02 DIAGNOSIS — E1122 Type 2 diabetes mellitus with diabetic chronic kidney disease: Secondary | ICD-10-CM | POA: Diagnosis not present

## 2021-03-02 DIAGNOSIS — I428 Other cardiomyopathies: Secondary | ICD-10-CM | POA: Insufficient documentation

## 2021-03-02 DIAGNOSIS — I472 Ventricular tachycardia, unspecified: Secondary | ICD-10-CM | POA: Insufficient documentation

## 2021-03-02 LAB — BASIC METABOLIC PANEL
Anion gap: 10 (ref 5–15)
BUN: 20 mg/dL (ref 8–23)
CO2: 21 mmol/L — ABNORMAL LOW (ref 22–32)
Calcium: 9.9 mg/dL (ref 8.9–10.3)
Chloride: 107 mmol/L (ref 98–111)
Creatinine, Ser: 1.43 mg/dL — ABNORMAL HIGH (ref 0.61–1.24)
GFR, Estimated: 52 mL/min — ABNORMAL LOW (ref >60)
Glucose, Bld: 100 mg/dL — ABNORMAL HIGH (ref 70–99)
Potassium: 4.3 mmol/L (ref 3.5–5.1)
Sodium: 138 mmol/L (ref 135–145)

## 2021-03-02 LAB — CBC
HCT: 38.4 % — ABNORMAL LOW (ref 39.0–52.0)
Hemoglobin: 13.5 g/dL (ref 13.0–17.0)
MCH: 27.9 pg (ref 26.0–34.0)
MCHC: 35.2 g/dL (ref 30.0–36.0)
MCV: 79.3 fL — ABNORMAL LOW (ref 80.0–100.0)
Platelets: 159 10*3/uL (ref 150–400)
RBC: 4.84 MIL/uL (ref 4.22–5.81)
RDW: 15.8 % — ABNORMAL HIGH (ref 11.5–15.5)
WBC: 5.1 10*3/uL (ref 4.0–10.5)
nRBC: 0 % (ref 0.0–0.2)

## 2021-03-02 LAB — MAGNESIUM: Magnesium: 2.1 mg/dL (ref 1.7–2.4)

## 2021-03-02 NOTE — Patient Instructions (Addendum)
Thank you for coming in today  Labs were done today, if any labs are abnormal the clinic will call you  PLEASE follow up with Dr. Maxwell Caul for your CPAP  Your physician recommends that you schedule a follow-up appointment in:  3-4 weeks in clinic  12 weeks with Dr. Haroldine Laws with echocardiogram  Your physician has requested that you have an echocardiogram. Echocardiography is a painless test that uses sound waves to create images of your heart. It provides your doctor with information about the size and shape of your heart and how well your hearts chambers and valves are working. This procedure takes approximately one hour. There are no restrictions for this procedure.   At the [facility], you and your health needs are our priority. As part of our continuing mission to provide you with exceptional heart care, we have created designated Provider Care Teams. These Care Teams include your primary Cardiologist (physician) and Advanced Practice Providers (APPs- Physician Assistants and Nurse Practitioners) who all work together to provide you with the care you need, when you need it.   You may see any of the following providers on your designated Care Team at your next follow up: Dr Glori Bickers Dr Haynes Kerns, NP Lyda Jester, [place] [facility] [place], [place] Audry Riles, PharmD   Please be sure to bring in all your medications bottles to every appointment.   If you have any questions or concerns before your next appointment please send [place] a message through [place] or call our office at [phone].    TO LEAVE A MESSAGE FOR THE NURSE SELECT OPTION 2, PLEASE LEAVE A MESSAGE INCLUDING: YOUR NAME DATE OF BIRTH CALL BACK NUMBER REASON FOR CALL**this is important as we prioritize the call backs  YOU WILL RECEIVE A CALL BACK THE SAME DAY AS LONG AS YOU CALL BEFORE 4:00 PM

## 2021-03-02 NOTE — Addendum Note (Signed)
Encounter addended by: Payton Mccallum, RN on: 03/02/2021 10:56 AM  Actions taken: Order list changed, Diagnosis association updated

## 2021-03-17 DIAGNOSIS — E119 Type 2 diabetes mellitus without complications: Secondary | ICD-10-CM | POA: Diagnosis not present

## 2021-03-17 DIAGNOSIS — R7989 Other specified abnormal findings of blood chemistry: Secondary | ICD-10-CM | POA: Diagnosis not present

## 2021-03-17 DIAGNOSIS — Z125 Encounter for screening for malignant neoplasm of prostate: Secondary | ICD-10-CM | POA: Diagnosis not present

## 2021-03-17 DIAGNOSIS — E785 Hyperlipidemia, unspecified: Secondary | ICD-10-CM | POA: Diagnosis not present

## 2021-03-17 DIAGNOSIS — I10 Essential (primary) hypertension: Secondary | ICD-10-CM | POA: Diagnosis not present

## 2021-03-19 ENCOUNTER — Other Ambulatory Visit (HOSPITAL_COMMUNITY): Payer: Self-pay | Admitting: Physician Assistant

## 2021-03-19 ENCOUNTER — Other Ambulatory Visit (HOSPITAL_COMMUNITY): Payer: Self-pay

## 2021-03-20 ENCOUNTER — Inpatient Hospital Stay: Payer: Self-pay

## 2021-03-20 ENCOUNTER — Other Ambulatory Visit: Payer: Self-pay

## 2021-03-20 ENCOUNTER — Emergency Department (HOSPITAL_COMMUNITY): Payer: Medicare PPO

## 2021-03-20 ENCOUNTER — Encounter (HOSPITAL_COMMUNITY): Payer: Self-pay

## 2021-03-20 ENCOUNTER — Inpatient Hospital Stay (HOSPITAL_COMMUNITY): Payer: Medicare PPO

## 2021-03-20 ENCOUNTER — Inpatient Hospital Stay (HOSPITAL_COMMUNITY)
Admission: EM | Admit: 2021-03-20 | Discharge: 2021-03-24 | DRG: 291 | Disposition: A | Discharge status: Home / Self Care | Payer: Medicare PPO | Attending: Internal Medicine | Admitting: Internal Medicine

## 2021-03-20 DIAGNOSIS — I130 Hypertensive heart and chronic kidney disease with heart failure and stage 1 through stage 4 chronic kidney disease, or unspecified chronic kidney disease: Principal | ICD-10-CM | POA: Diagnosis present

## 2021-03-20 DIAGNOSIS — I517 Cardiomegaly: Secondary | ICD-10-CM | POA: Diagnosis not present

## 2021-03-20 DIAGNOSIS — Z79899 Other long term (current) drug therapy: Secondary | ICD-10-CM | POA: Diagnosis not present

## 2021-03-20 DIAGNOSIS — I252 Old myocardial infarction: Secondary | ICD-10-CM

## 2021-03-20 DIAGNOSIS — Z6832 Body mass index (BMI) 32.0-32.9, adult: Secondary | ICD-10-CM | POA: Diagnosis not present

## 2021-03-20 DIAGNOSIS — Z7984 Long term (current) use of oral hypoglycemic drugs: Secondary | ICD-10-CM | POA: Diagnosis not present

## 2021-03-20 DIAGNOSIS — Z87891 Personal history of nicotine dependence: Secondary | ICD-10-CM

## 2021-03-20 DIAGNOSIS — I2510 Atherosclerotic heart disease of native coronary artery without angina pectoris: Secondary | ICD-10-CM | POA: Diagnosis present

## 2021-03-20 DIAGNOSIS — I5022 Chronic systolic (congestive) heart failure: Secondary | ICD-10-CM | POA: Diagnosis not present

## 2021-03-20 DIAGNOSIS — D696 Thrombocytopenia, unspecified: Secondary | ICD-10-CM | POA: Diagnosis not present

## 2021-03-20 DIAGNOSIS — I340 Nonrheumatic mitral (valve) insufficiency: Secondary | ICD-10-CM | POA: Diagnosis present

## 2021-03-20 DIAGNOSIS — I110 Hypertensive heart disease with heart failure: Secondary | ICD-10-CM | POA: Diagnosis not present

## 2021-03-20 DIAGNOSIS — I428 Other cardiomyopathies: Secondary | ICD-10-CM | POA: Diagnosis present

## 2021-03-20 DIAGNOSIS — E1122 Type 2 diabetes mellitus with diabetic chronic kidney disease: Secondary | ICD-10-CM | POA: Diagnosis not present

## 2021-03-20 DIAGNOSIS — I5023 Acute on chronic systolic (congestive) heart failure: Secondary | ICD-10-CM | POA: Diagnosis not present

## 2021-03-20 DIAGNOSIS — G4733 Obstructive sleep apnea (adult) (pediatric): Secondary | ICD-10-CM | POA: Diagnosis present

## 2021-03-20 DIAGNOSIS — I447 Left bundle-branch block, unspecified: Secondary | ICD-10-CM | POA: Diagnosis not present

## 2021-03-20 DIAGNOSIS — D509 Iron deficiency anemia, unspecified: Secondary | ICD-10-CM | POA: Diagnosis present

## 2021-03-20 DIAGNOSIS — Z8249 Family history of ischemic heart disease and other diseases of the circulatory system: Secondary | ICD-10-CM | POA: Diagnosis not present

## 2021-03-20 DIAGNOSIS — Z7982 Long term (current) use of aspirin: Secondary | ICD-10-CM

## 2021-03-20 DIAGNOSIS — I472 Ventricular tachycardia, unspecified: Secondary | ICD-10-CM | POA: Diagnosis not present

## 2021-03-20 DIAGNOSIS — Z452 Encounter for adjustment and management of vascular access device: Secondary | ICD-10-CM | POA: Diagnosis not present

## 2021-03-20 DIAGNOSIS — R7989 Other specified abnormal findings of blood chemistry: Secondary | ICD-10-CM

## 2021-03-20 DIAGNOSIS — I5043 Acute on chronic combined systolic (congestive) and diastolic (congestive) heart failure: Secondary | ICD-10-CM | POA: Diagnosis not present

## 2021-03-20 DIAGNOSIS — R0602 Shortness of breath: Secondary | ICD-10-CM

## 2021-03-20 DIAGNOSIS — R778 Other specified abnormalities of plasma proteins: Secondary | ICD-10-CM | POA: Diagnosis present

## 2021-03-20 DIAGNOSIS — J9811 Atelectasis: Secondary | ICD-10-CM | POA: Diagnosis not present

## 2021-03-20 DIAGNOSIS — N1831 Chronic kidney disease, stage 3a: Secondary | ICD-10-CM | POA: Diagnosis not present

## 2021-03-20 DIAGNOSIS — N289 Disorder of kidney and ureter, unspecified: Secondary | ICD-10-CM | POA: Diagnosis not present

## 2021-03-20 DIAGNOSIS — J811 Chronic pulmonary edema: Secondary | ICD-10-CM | POA: Diagnosis not present

## 2021-03-20 DIAGNOSIS — N281 Cyst of kidney, acquired: Secondary | ICD-10-CM | POA: Diagnosis not present

## 2021-03-20 DIAGNOSIS — R579 Shock, unspecified: Secondary | ICD-10-CM | POA: Diagnosis present

## 2021-03-20 DIAGNOSIS — N2889 Other specified disorders of kidney and ureter: Secondary | ICD-10-CM | POA: Diagnosis not present

## 2021-03-20 DIAGNOSIS — I509 Heart failure, unspecified: Secondary | ICD-10-CM | POA: Diagnosis not present

## 2021-03-20 DIAGNOSIS — E669 Obesity, unspecified: Secondary | ICD-10-CM | POA: Diagnosis not present

## 2021-03-20 DIAGNOSIS — N179 Acute kidney failure, unspecified: Secondary | ICD-10-CM

## 2021-03-20 DIAGNOSIS — I5021 Acute systolic (congestive) heart failure: Secondary | ICD-10-CM | POA: Diagnosis not present

## 2021-03-20 LAB — CBC
HCT: 36.4 % — ABNORMAL LOW (ref 39.0–52.0)
Hemoglobin: 12.8 g/dL — ABNORMAL LOW (ref 13.0–17.0)
MCH: 28.3 pg (ref 26.0–34.0)
MCHC: 35.2 g/dL (ref 30.0–36.0)
MCV: 80.5 fL (ref 80.0–100.0)
Platelets: 125 10*3/uL — ABNORMAL LOW (ref 150–400)
RBC: 4.52 MIL/uL (ref 4.22–5.81)
RDW: 16.6 % — ABNORMAL HIGH (ref 11.5–15.5)
WBC: 5 10*3/uL (ref 4.0–10.5)
nRBC: 0 % (ref 0.0–0.2)

## 2021-03-20 LAB — BASIC METABOLIC PANEL
Anion gap: 14 (ref 5–15)
BUN: 18 mg/dL (ref 8–23)
CO2: 20 mmol/L — ABNORMAL LOW (ref 22–32)
Calcium: 10 mg/dL (ref 8.9–10.3)
Chloride: 106 mmol/L (ref 98–111)
Creatinine, Ser: 1.61 mg/dL — ABNORMAL HIGH (ref 0.61–1.24)
GFR, Estimated: 45 mL/min — ABNORMAL LOW (ref >60)
Glucose, Bld: 139 mg/dL — ABNORMAL HIGH (ref 70–99)
Potassium: 3.9 mmol/L (ref 3.5–5.1)
Sodium: 140 mmol/L (ref 135–145)

## 2021-03-20 LAB — COOXEMETRY PANEL
Carboxyhemoglobin: 1.2 % (ref 0.5–1.5)
Methemoglobin: <0.7 % (ref 0.0–1.5)
O2 Saturation: 71.6 %
Total hemoglobin: 11.9 g/dL — ABNORMAL LOW (ref 12.0–16.0)

## 2021-03-20 LAB — TROPONIN I (HIGH SENSITIVITY)
Troponin I (High Sensitivity): 674 ng/L (ref <18)
Troponin I (High Sensitivity): 716 ng/L (ref <18)

## 2021-03-20 LAB — BRAIN NATRIURETIC PEPTIDE: B Natriuretic Peptide: 3221.1 pg/mL — ABNORMAL HIGH (ref 0.0–100.0)

## 2021-03-20 IMAGING — US US RENAL
1 series · 14 of 25 positions shown · non-contrast
Comparison: CT chest from same day.

CLINICAL DATA: Renal asymmetry on CT chest from same day.

EXAM:
RENAL / URINARY TRACT ULTRASOUND COMPLETE

[Series 1: us renal · 14 of 54 slices shown]
[im 1/54]
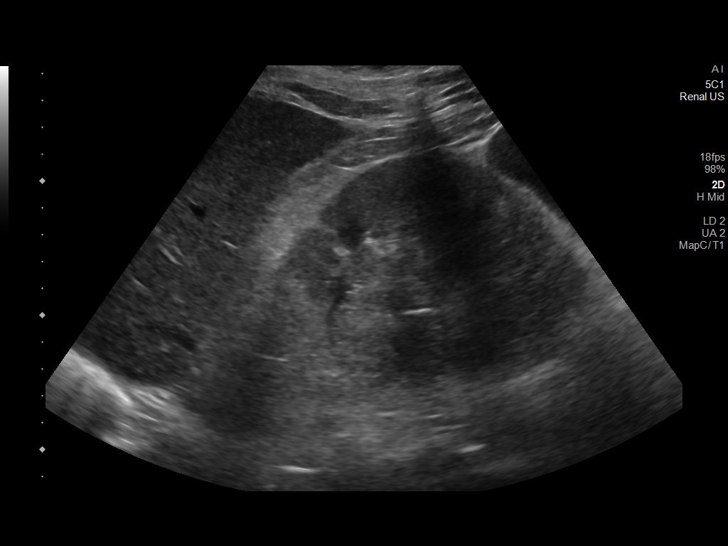
[im 5/54]
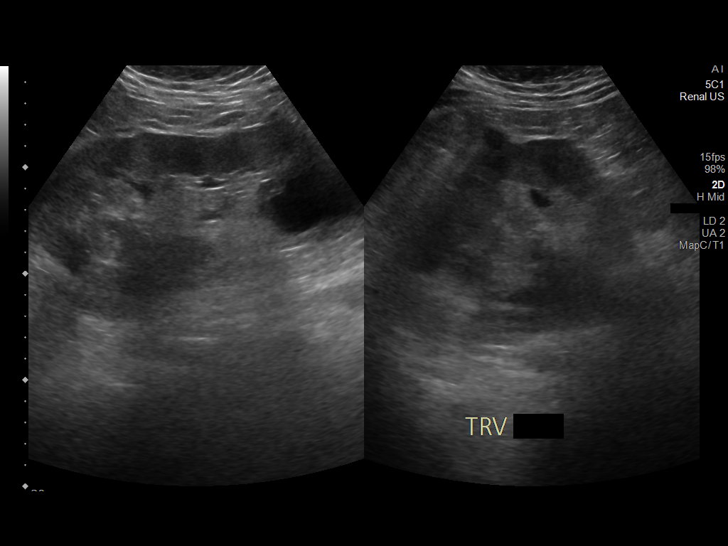
[im 9/54]
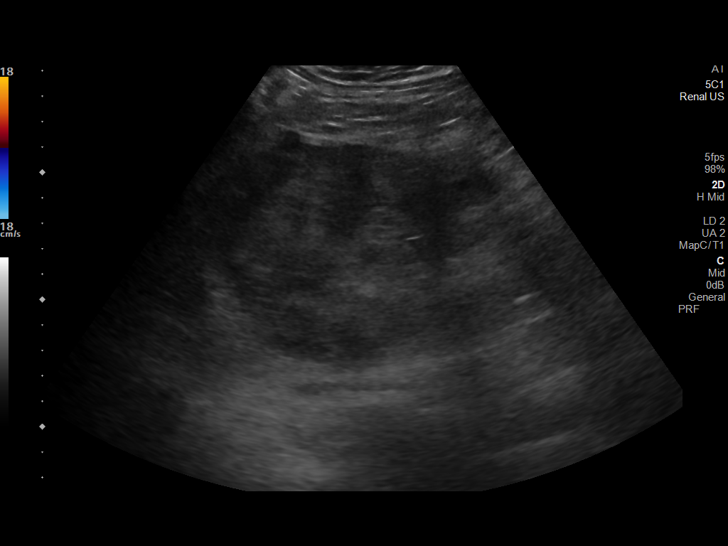
[im 14/54]
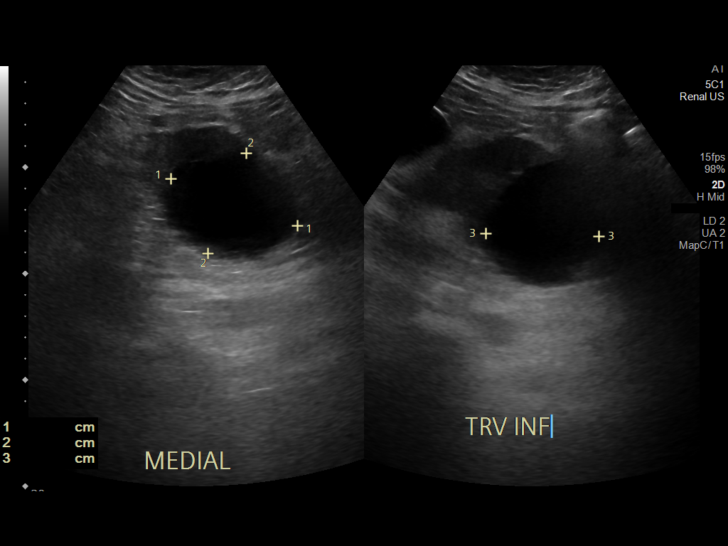
[im 18/54]
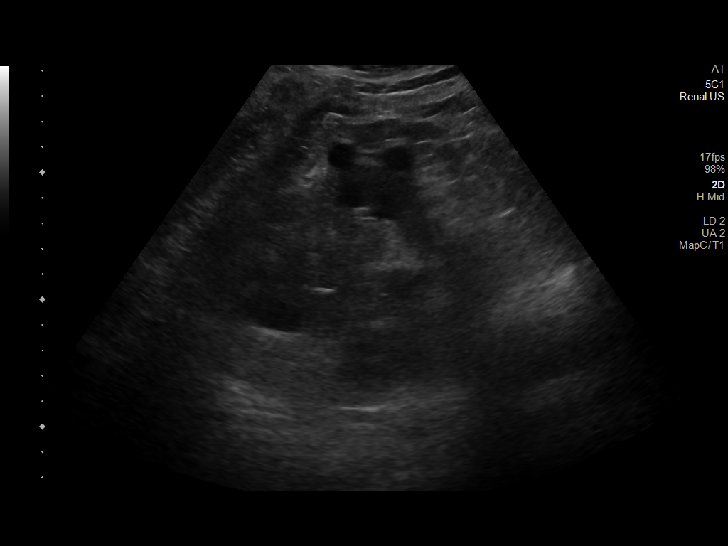
[im 20/54]
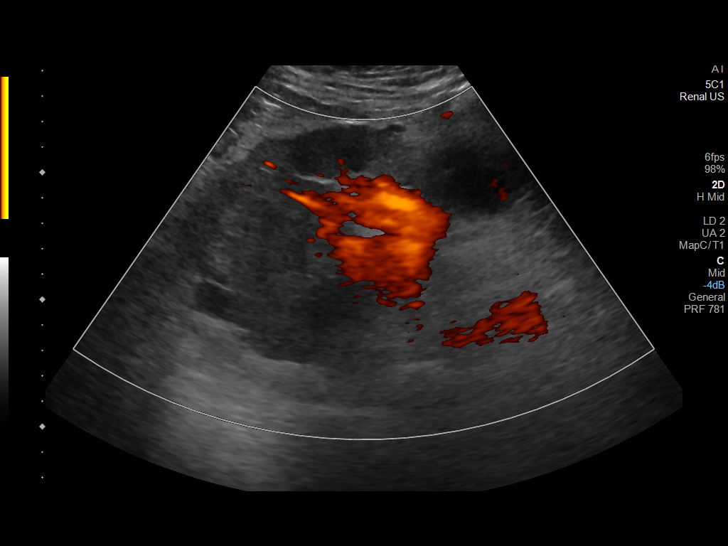
[im 25/54]
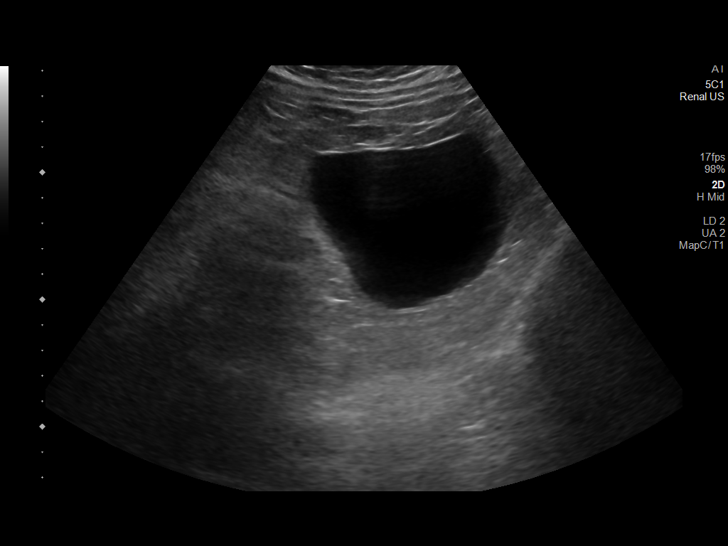
[im 29/54]
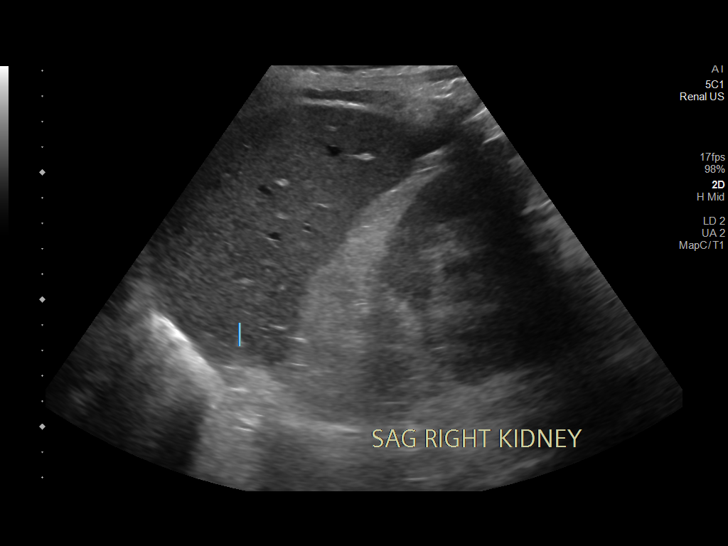
[im 34/54]
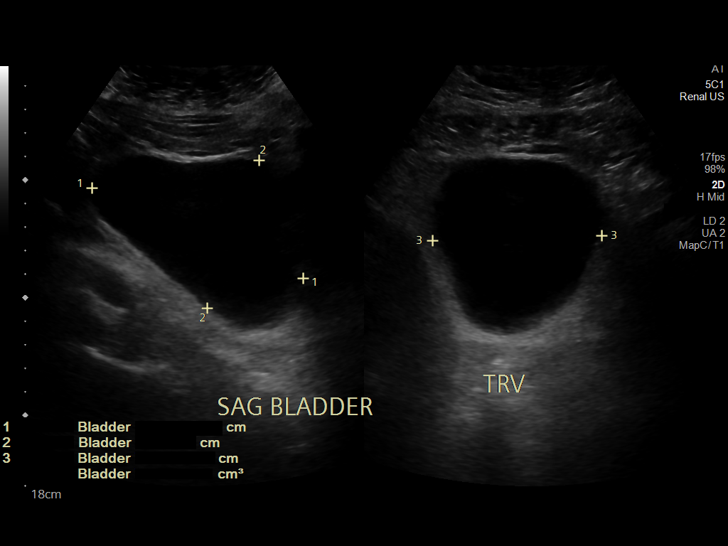
[im 36/54]
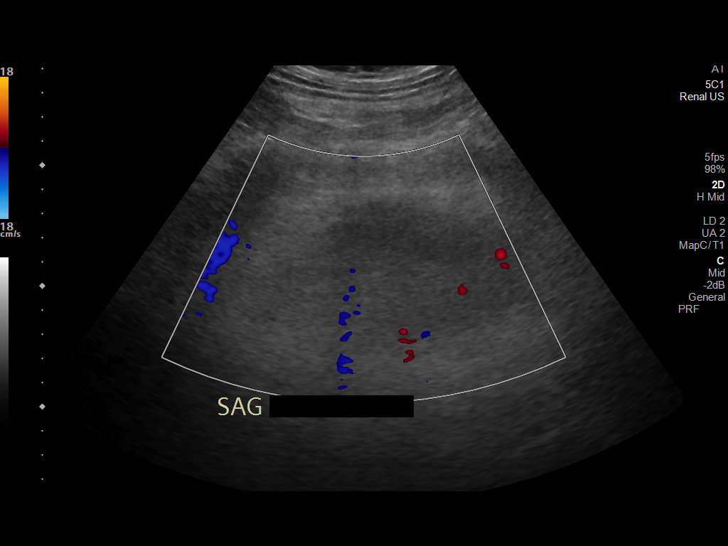
[im 40/54]
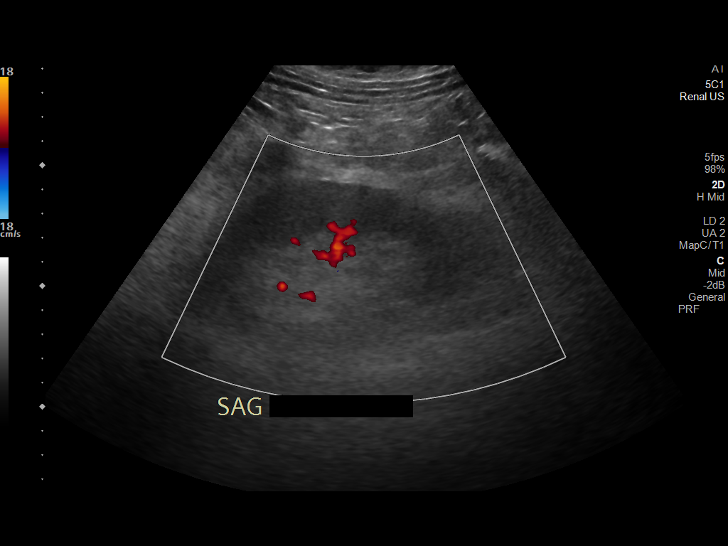
[im 45/54]
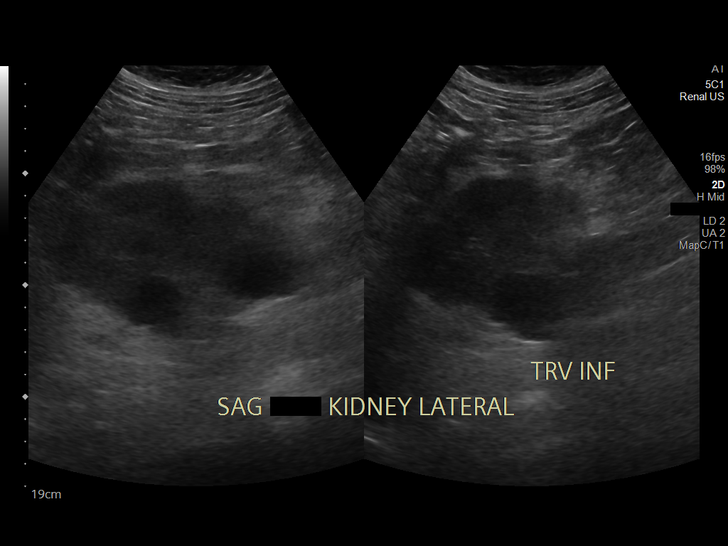
[im 49/54]
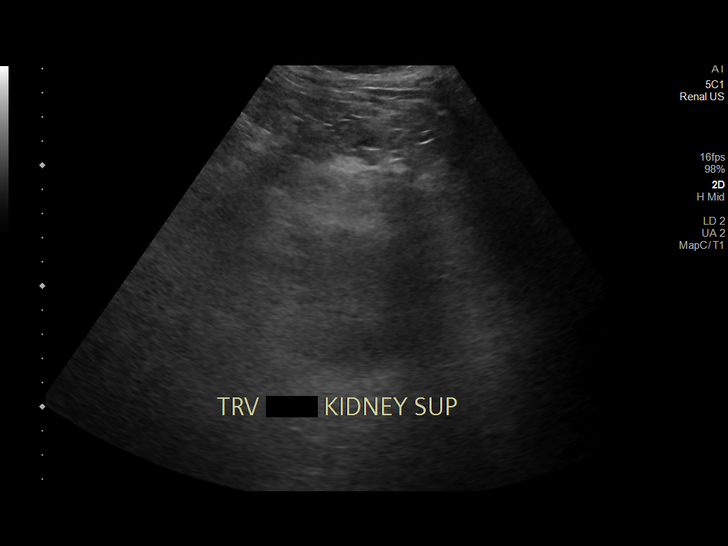
[im 54/54]
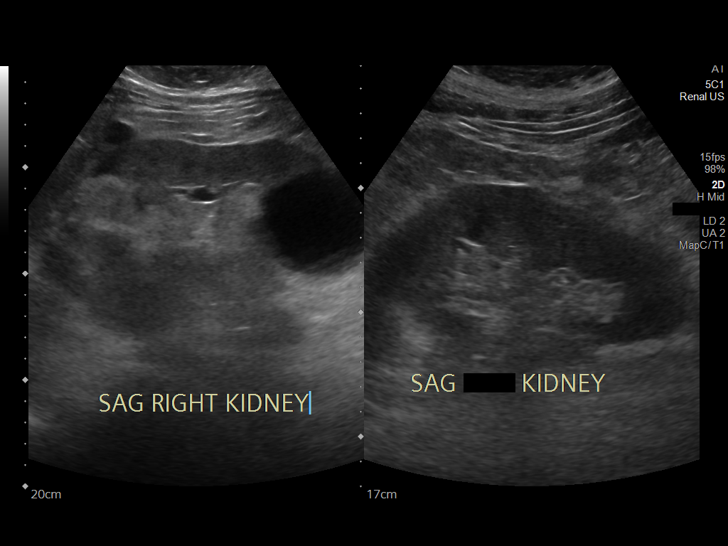

[14 of 25 positions shown; findings below may reference images not displayed]

FINDINGS: Right Kidney:

Renal measurements: 14.0 x 7.3 x 6.5 cm = volume: 347 mL. There is
an 8.3 x 7.0 x 7.6 cm solid mass centered within the midpole. There
are two simple cysts arising from the lower pole measuring up to
cm.

Left Kidney:

Renal measurements: 13.0 x 6.7 x 5.9 cm = volume: 267 mL.
Echogenicity within normal limits. No mass or hydronephrosis
visualized. There are two simple cysts measuring up to 3.1 cm.

Bladder:

Appears normal for degree of bladder distention.

Other:

None.
IMPRESSION: 1. Large 8.3 cm solid mass centered within the right kidney, highly
concerning for renal cell carcinoma. Further characterization with
non-emergent renal protocol CT or MRI of the abdomen with and
without contrast is recommended.

## 2021-03-20 IMAGING — CR DG CHEST 1V
1 series · 1 of 1 positions shown · non-contrast
Comparison: [DATE]

CLINICAL DATA: Shortness of breath

EXAM:
CHEST  1 VIEW

[chest ap]
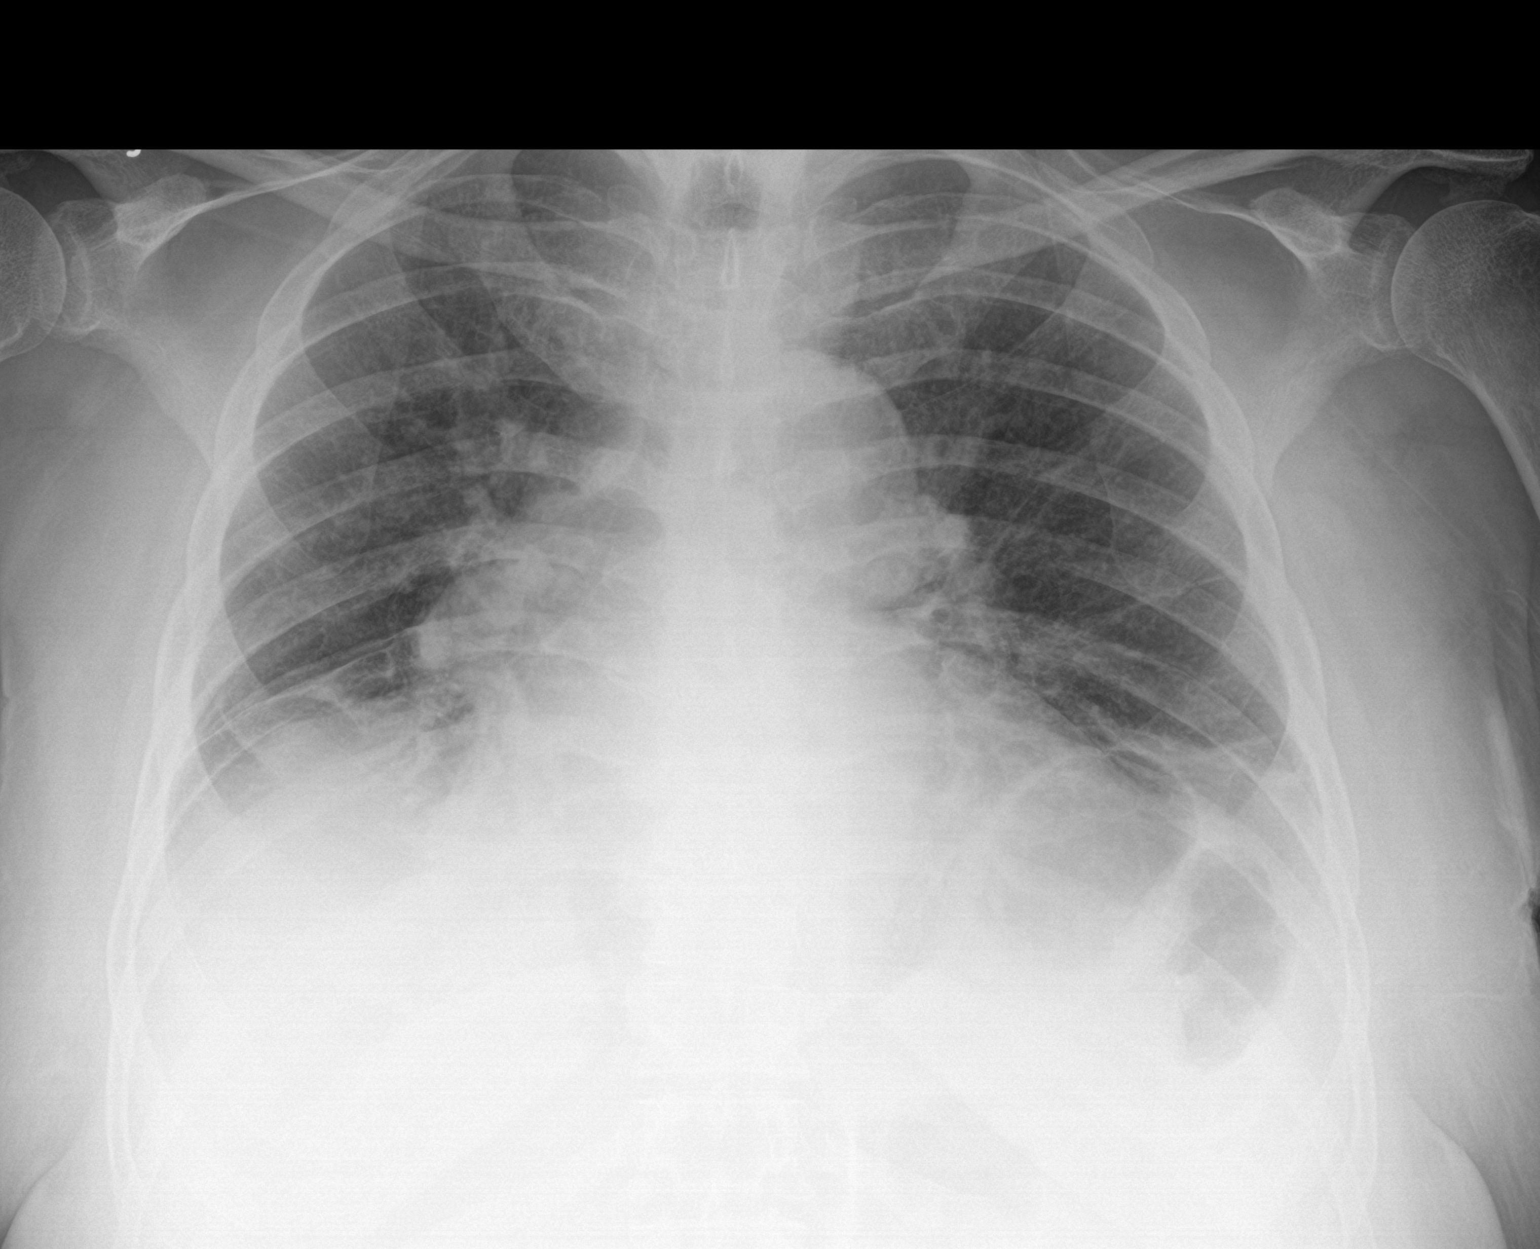

[1 of 1 positions shown; findings below may reference images not displayed]

FINDINGS: Transverse diameter of heart is increased. There is interval
decrease in pulmonary vascular congestion. There are linear
densities in both lower lung fields suggesting subsegmental
atelectasis. There is no significant pleural effusion or
pneumothorax.
IMPRESSION: Cardiomegaly. There is interval clearing of pulmonary vascular
congestion. Small linear densities in the lower lung fields suggest
subsegmental atelectasis.

## 2021-03-20 IMAGING — CR DG CHEST 1V
1 series · 1 of 1 positions shown · non-contrast
Comparison: AP chest earlier same day

CLINICAL DATA: Status post PICC.  Central line placement.

EXAM:
CHEST  1 VIEW

[chest ap]
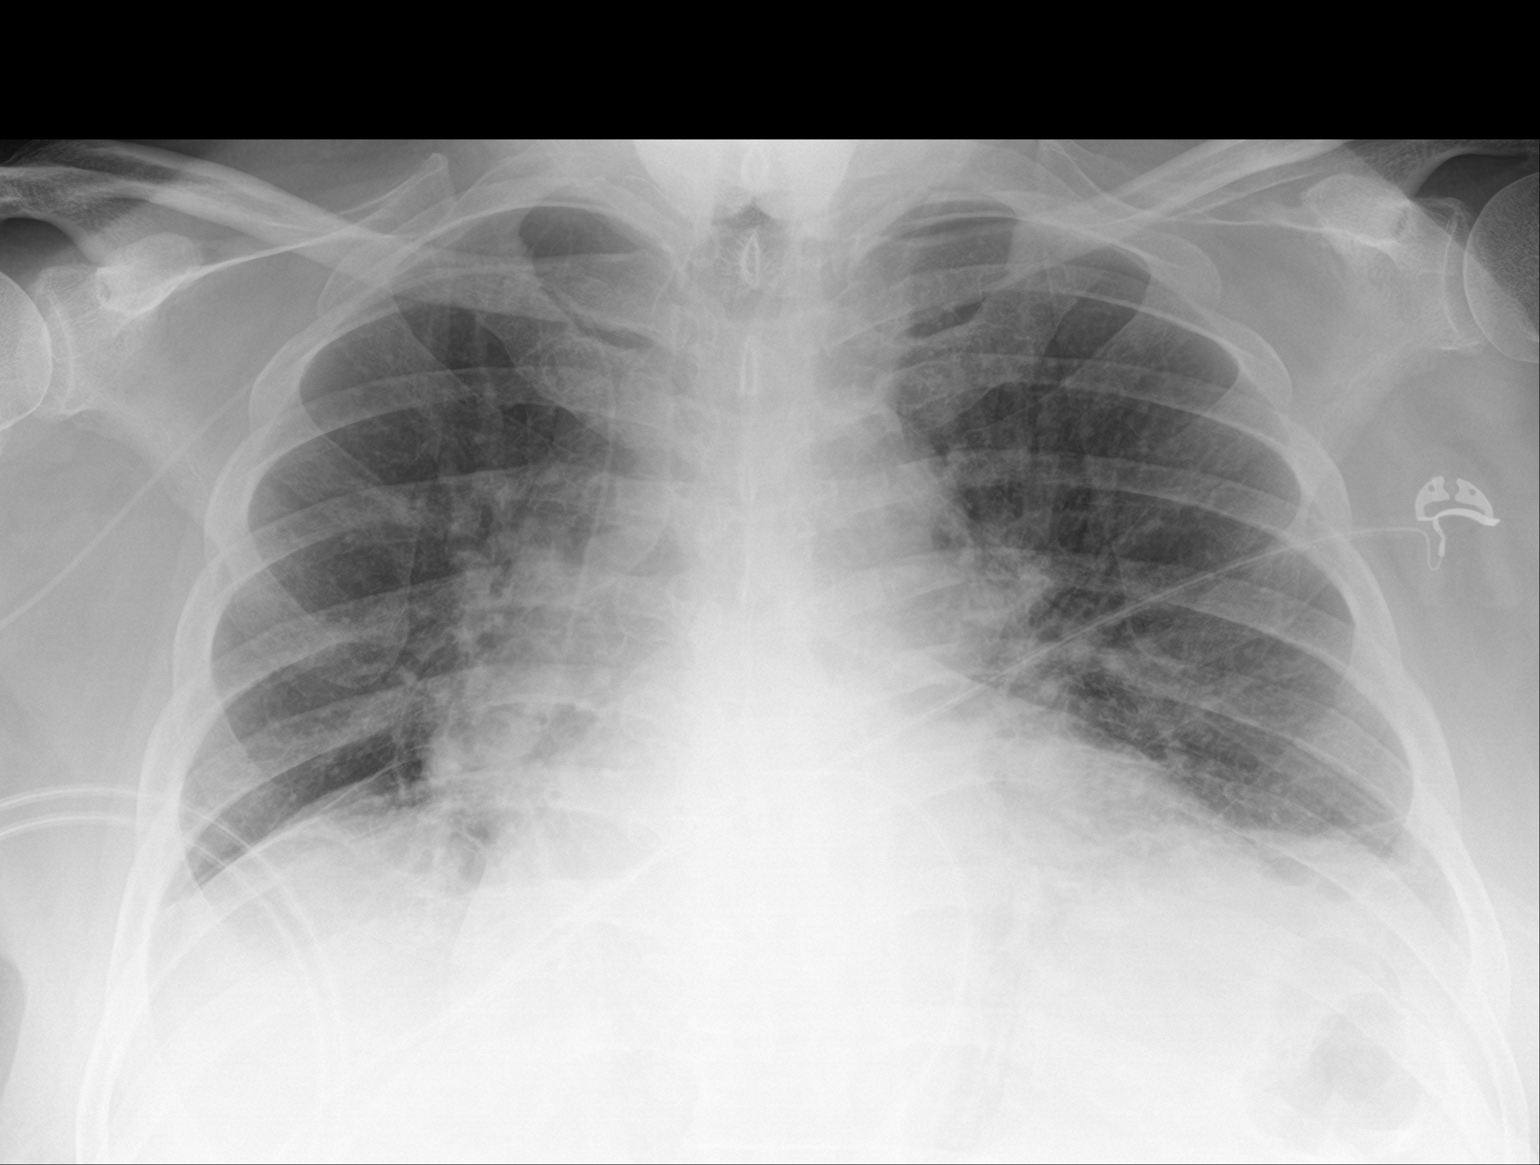

[1 of 1 positions shown; findings below may reference images not displayed]

FINDINGS: New right upper extremity PICC tip overlies the central superior
vena cava.

Cardiac silhouette is again moderately markedly enlarged.
Mediastinal contours are grossly within normal limits. Moderately
decreased lung volumes with bibasilar horizontal linear subsegmental
atelectasis. No pleural effusion or pneumothorax. No acute skeletal
abnormality.
IMPRESSION: New right upper extremity PICC tip overlies the central superior
vena cava.

No significant change in cardiomegaly and bibasilar subsegmental
atelectasis.

## 2021-03-20 IMAGING — CT CT ANGIO CHEST
2 of 6 series · 18 of 36 positions shown · IV contrast (agent unspecified)
Comparison: Chest x-ray from same day.

CLINICAL DATA: Shortness of breath since [REDACTED].

EXAM:
CT ANGIOGRAPHY CHEST WITH CONTRAST
TECHNIQUE: Multidetector CT imaging of the chest was performed using the
standard protocol during bolus administration of intravenous
contrast. Multiplanar CT image reconstructions and MIPs were
obtained to evaluate the vascular anatomy.

[Series 7: pe thins · axial · 0.85mm/px · z∈[+1198,+1497]mm · 17 of 475 slices shown]
[im 24/475  lung]
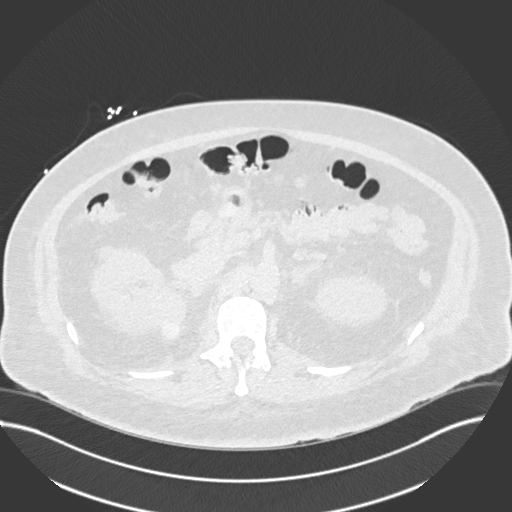
[im 48/475  mediastinal]
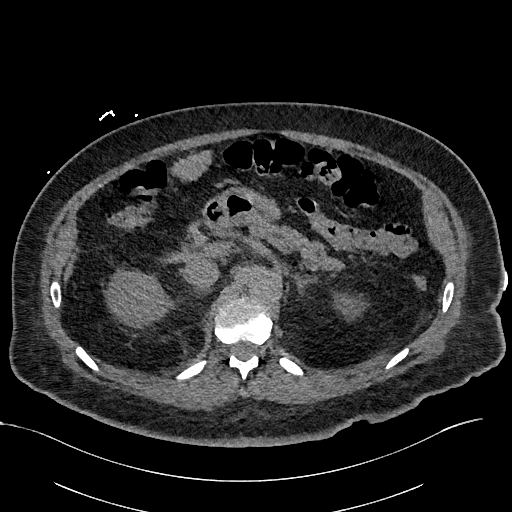
[im 72/475  lung]
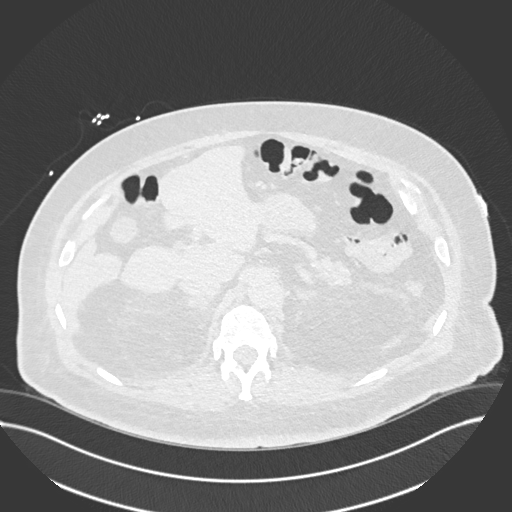
[im 95/475  mediastinal]
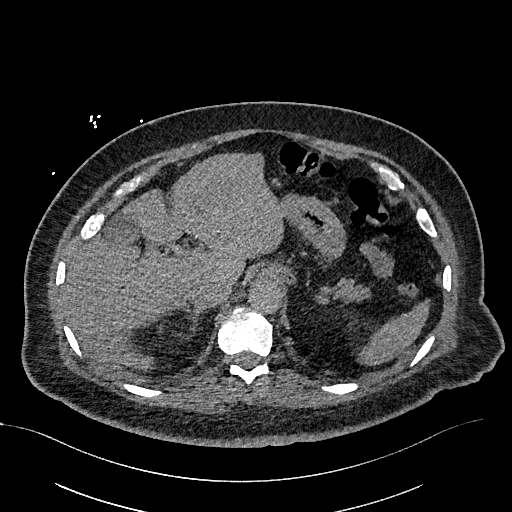
[im 143/475  lung]
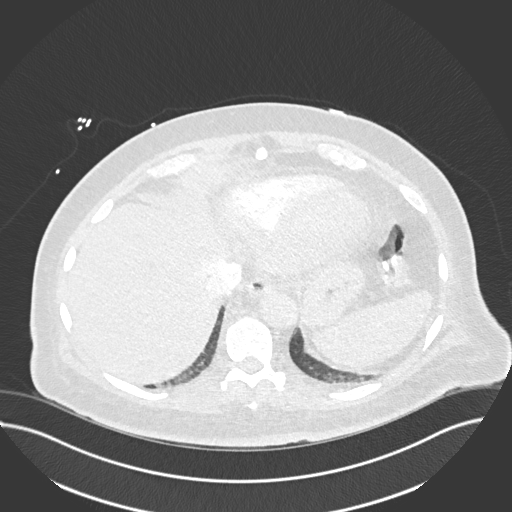
[im 166/475  mediastinal]
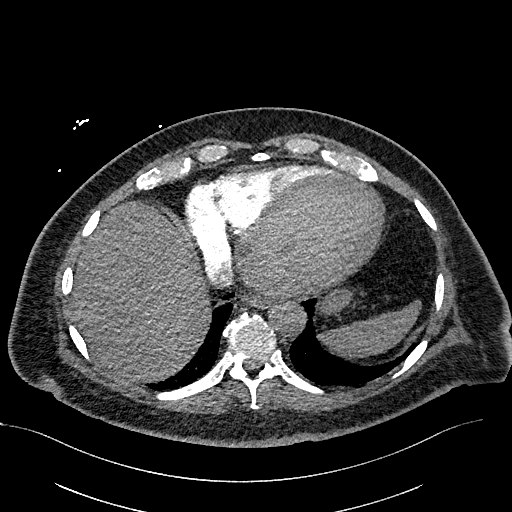
[im 190/475  lung]
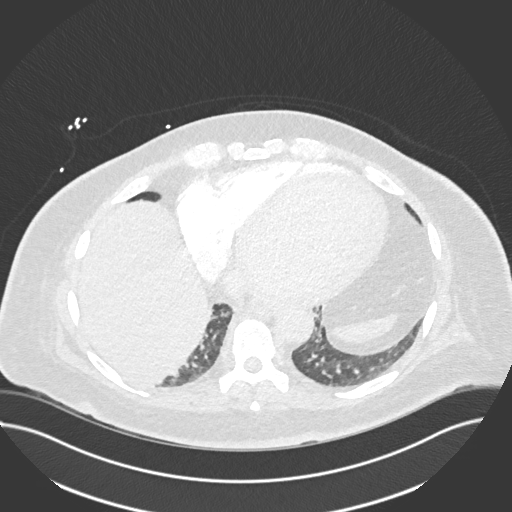
[im 214/475  mediastinal]
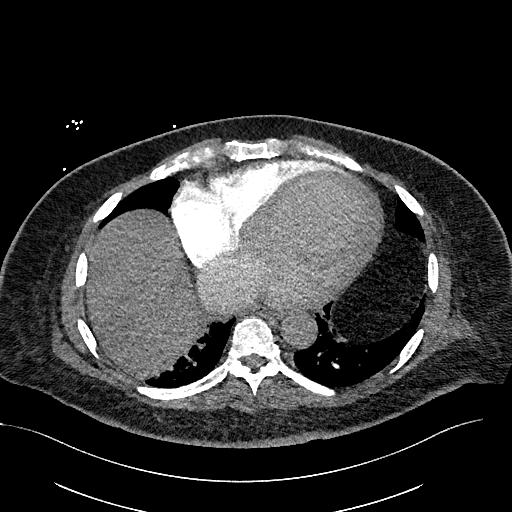
[im 238/475  lung]
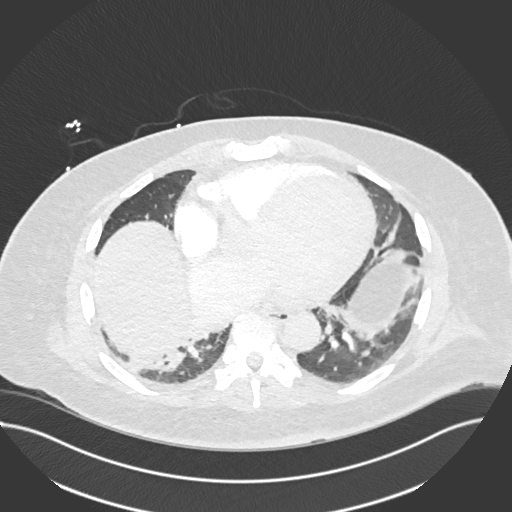
[im 261/475  mediastinal]
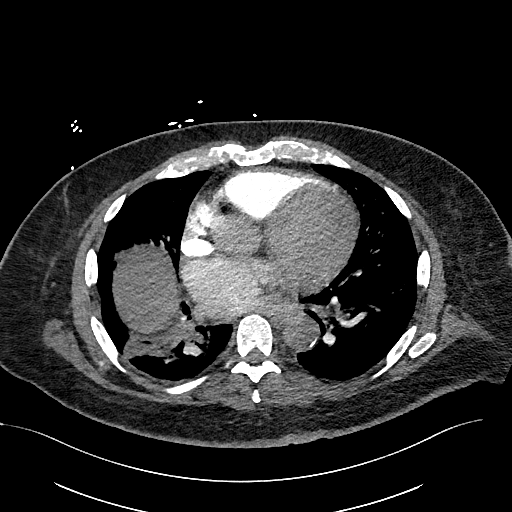
[im 285/475  lung]
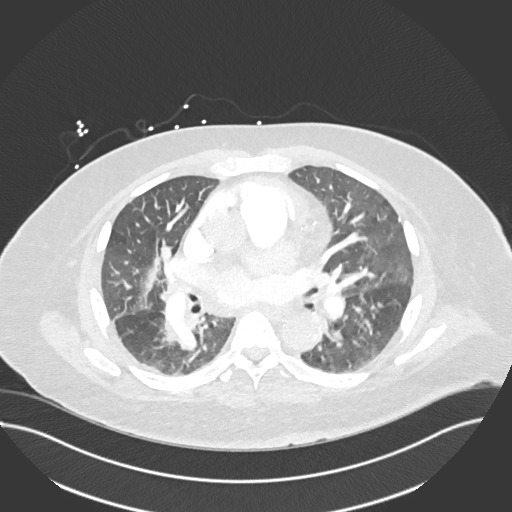
[im 309/475  mediastinal]
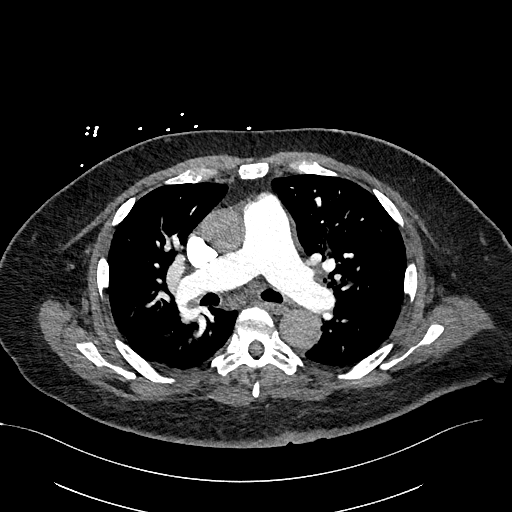
[im 332/475  lung]
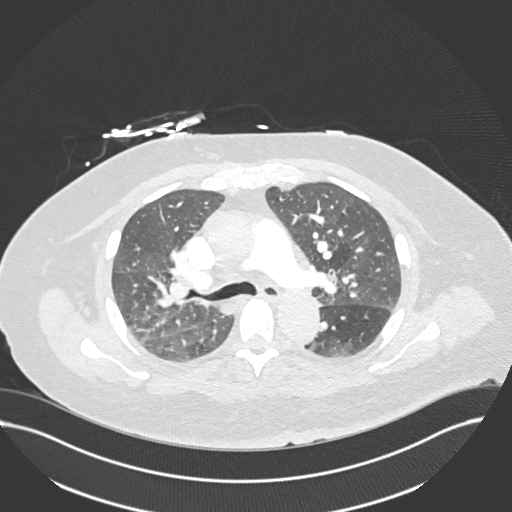
[im 380/475  mediastinal]
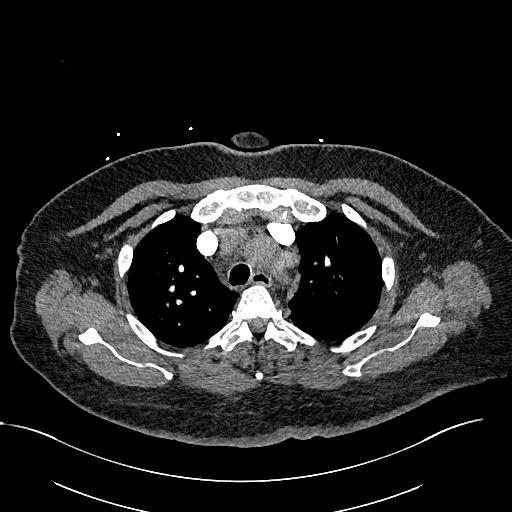
[im 403/475  lung]
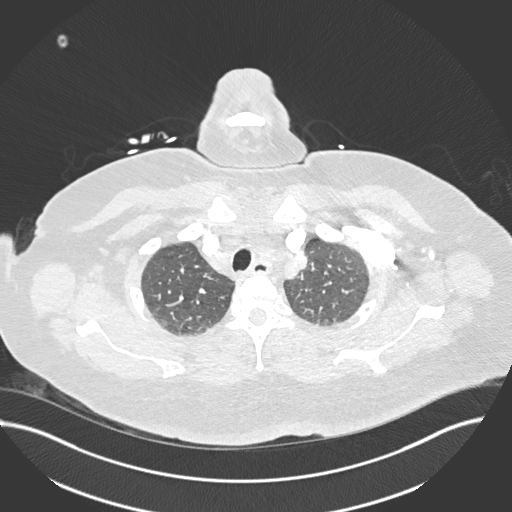
[im 427/475  mediastinal]
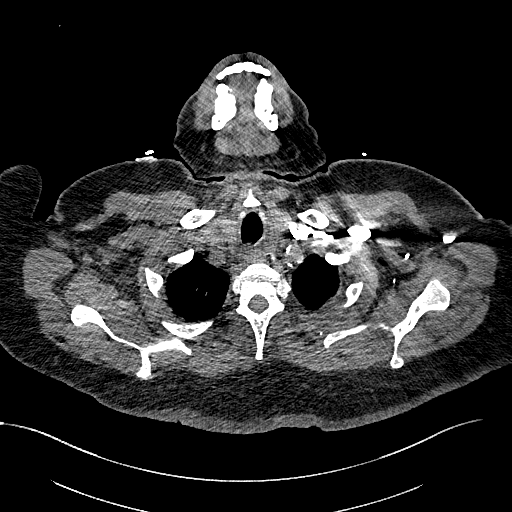
[im 451/475  lung]
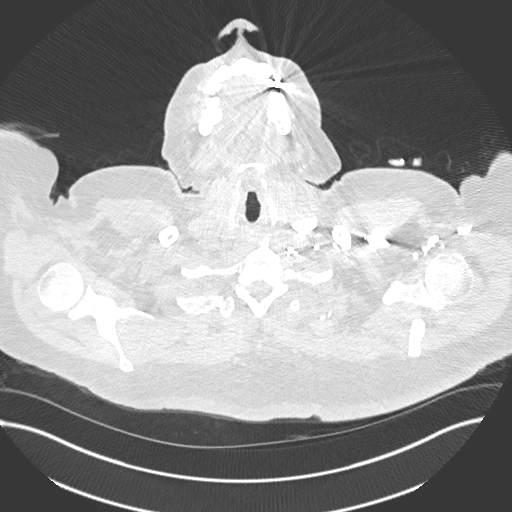

[Series 8: pe 2mm cor · coronal · 0.65mm/px · 1 of 163 slices shown]
[im 82/163  mediastinal]
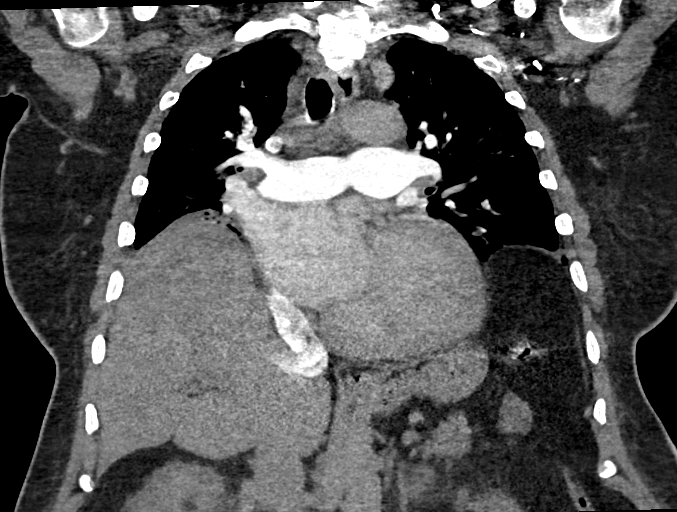

[18 of 36 positions shown; findings below may reference images not displayed]

RADIATION DOSE REDUCTION: This exam was performed according to the
departmental dose-optimization program which includes automated
exposure control, adjustment of the mA and/or kV according to
patient size and/or use of iterative reconstruction technique.

CONTRAST:  100mL OMNIPAQUE IOHEXOL 350 MG/ML SOLN
FINDINGS: Cardiovascular: Satisfactory opacification of the pulmonary arteries
to the segmental level. No evidence of pulmonary embolism. Dilated
main pulmonary artery measuring 3.8 cm in diameter. Cardiomegaly
with severe left ventricular dilatation. No pericardial effusion. No
thoracic aortic aneurysm. Reflux of contrast into the IVC. Prominent
pulmonary vasculature.

Mediastinum/Nodes: No enlarged mediastinal, hilar, or axillary lymph
nodes. Thyroid gland, trachea, and esophagus demonstrate no
significant findings.

Lungs/Pleura: Few small ground-glass densities in the left upper
lobe (series 6, images 30 and 54) and right upper lobe (series 6,
image 42). Mild bibasilar subsegmental atelectasis. No focal
consolidation, pleural effusion, or pneumothorax.

Upper Abdomen: No acute abnormality. Renal asymmetry with enlarged
right kidney compared to the left.

Musculoskeletal: No chest wall abnormality. No acute or significant
osseous findings.

Review of the MIP images confirms the above findings.
IMPRESSION: 1. No evidence of pulmonary embolism.
2. Few small ground-glass densities in the left upper lobe and right
upper lobe, nonspecific, but favored residual pulmonary edema, less
likely infectious or inflammatory.
3. Dilated main pulmonary artery, suggestive of pulmonary arterial
hypertension. Evidence of right heart dysfunction.
4. Renal asymmetry with enlarged right kidney compared to the left.
An underlying right renal mass is not excluded. Recommend further
evaluation with renal ultrasound or contrast-enhanced CT of the
abdomen.

## 2021-03-20 MED ORDER — MILRINONE LACTATE IN DEXTROSE 20-5 MG/100ML-% IV SOLN
0.1250 ug/kg/min | INTRAVENOUS | Status: DC
  Administered 2021-03-20: 0.25 ug/kg/min via INTRAVENOUS
  Administered 2021-03-21: 0.125 ug/kg/min via INTRAVENOUS
  Filled 2021-03-20 (×2): qty 100

## 2021-03-20 MED ORDER — FUROSEMIDE 10 MG/ML IJ SOLN
40.0000 mg | Freq: Once | INTRAMUSCULAR | Status: AC
  Administered 2021-03-20: 40 mg via INTRAVENOUS
  Filled 2021-03-20: qty 4

## 2021-03-20 MED ORDER — SODIUM CHLORIDE 0.9 % IV SOLN: 250.0000 mL | INTRAVENOUS | Status: DC | PRN

## 2021-03-20 MED ORDER — EMPAGLIFLOZIN 10 MG PO TABS
10.0000 mg | ORAL_TABLET | Freq: Every day | ORAL | Status: DC
  Administered 2021-03-21 – 2021-03-24 (×4): 10 mg via ORAL
  Filled 2021-03-20 (×4): qty 1

## 2021-03-20 MED ORDER — SODIUM CHLORIDE 0.9% FLUSH: 3.0000 mL | INTRAVENOUS | Status: DC | PRN

## 2021-03-20 MED ORDER — SPIRONOLACTONE 12.5 MG HALF TABLET
12.5000 mg | ORAL_TABLET | Freq: Every day | ORAL | Status: DC
  Administered 2021-03-21 – 2021-03-22 (×2): 12.5 mg via ORAL
  Filled 2021-03-20 (×2): qty 1

## 2021-03-20 MED ORDER — ACETAMINOPHEN 325 MG PO TABS: 650.0000 mg | ORAL_TABLET | ORAL | Status: DC | PRN

## 2021-03-20 MED ORDER — FUROSEMIDE 10 MG/ML IJ SOLN: 60.0000 mg | Freq: Two times a day (BID) | INTRAMUSCULAR | Status: DC

## 2021-03-20 MED ORDER — CHLORHEXIDINE GLUCONATE CLOTH 2 % EX PADS
6.0000 | MEDICATED_PAD | Freq: Every day | CUTANEOUS | Status: DC
  Administered 2021-03-20 – 2021-03-24 (×5): 6 via TOPICAL

## 2021-03-20 MED ORDER — SODIUM CHLORIDE 0.9% FLUSH: 10.0000 mL | INTRAVENOUS | Status: DC | PRN

## 2021-03-20 MED ORDER — ASPIRIN EC 81 MG PO TBEC
81.0000 mg | DELAYED_RELEASE_TABLET | Freq: Every day | ORAL | Status: DC
Start: 2021-03-21 — End: 2021-03-24
  Administered 2021-03-21 – 2021-03-24 (×4): 81 mg via ORAL
  Filled 2021-03-20 (×4): qty 1

## 2021-03-20 MED ORDER — HEPARIN SODIUM (PORCINE) 5000 UNIT/ML IJ SOLN
5000.0000 [IU] | Freq: Three times a day (TID) | INTRAMUSCULAR | Status: DC
  Administered 2021-03-21 – 2021-03-24 (×11): 5000 [IU] via SUBCUTANEOUS
  Filled 2021-03-20 (×10): qty 1

## 2021-03-20 MED ORDER — ROSUVASTATIN CALCIUM 20 MG PO TABS
40.0000 mg | ORAL_TABLET | Freq: Every day | ORAL | Status: DC
  Administered 2021-03-21 – 2021-03-24 (×4): 40 mg via ORAL
  Filled 2021-03-20 (×4): qty 2

## 2021-03-20 MED ORDER — SODIUM CHLORIDE 0.9% FLUSH
10.0000 mL | Freq: Two times a day (BID) | INTRAVENOUS | Status: DC
  Administered 2021-03-21 – 2021-03-22 (×3): 10 mL
  Administered 2021-03-23: 40 mL
  Administered 2021-03-23 – 2021-03-24 (×2): 10 mL

## 2021-03-20 MED ORDER — ONDANSETRON HCL 4 MG/2ML IJ SOLN: 4.0000 mg | Freq: Four times a day (QID) | INTRAMUSCULAR | Status: DC | PRN

## 2021-03-20 MED ORDER — SACUBITRIL-VALSARTAN 24-26 MG PO TABS
1.0000 | ORAL_TABLET | Freq: Two times a day (BID) | ORAL | Status: DC
  Administered 2021-03-21 – 2021-03-24 (×8): 1 via ORAL
  Filled 2021-03-20 (×8): qty 1

## 2021-03-20 MED ORDER — IOHEXOL 350 MG/ML SOLN
100.0000 mL | Freq: Once | INTRAVENOUS | Status: AC | PRN
  Administered 2021-03-20: 100 mL via INTRAVENOUS

## 2021-03-20 MED ORDER — SODIUM CHLORIDE 0.9% FLUSH
3.0000 mL | Freq: Two times a day (BID) | INTRAVENOUS | Status: DC
  Administered 2021-03-21 – 2021-03-24 (×6): 3 mL via INTRAVENOUS

## 2021-03-20 NOTE — ED Provider Notes (Addendum)
?[facility] ?Provider Note ? ? ?CSN: [id number] ?Arrival date & time: 03/20/21  1130 ? ?  ? ?History ? ?Chief Complaint  ?Patient presents with  ? Shortness of Breath  ? ? ?Bryce Cain is a 71 y.o. male. ? ?HPI ?Patient is a 71 year old male with a history of DM 2, hypertension, OSA, recently diagnosed systolic CHF who presents to the emergency department due to shortness of breath.  Patient recently admitted on February 11 and discharged on February 16 of this year for new onset CHF.  Echocardiogram showed an EF of 20% to 25% with anterior/apical wall motion abnormalities suggestive of previous anterior MI.  Moderate mitral regurgitation.  He was diuresed with IV Lasix and started on p.o. Lasix 40 mg twice per week. ? ?Patient states that since being discharged she has been compliant with all of his regular medications.  He has continued to experience shortness of breath as well as chest tightness.  States it is particularly worse when lying flat.  States that he feels that his symptoms actually worsen on days that he takes Lasix.  He will experience decreased appetite, fatigue, and will have difficulty getting out of bed.  Over the past few days his symptoms have continued to worsen.  Denies any pedal edema. ?  ? ?Home Medications ?Prior to Admission medications   ?Medication Sig Start Date End Date Taking? Authorizing Provider  ?acidophilus (RISAQUAD) CAPS capsule Take 1 capsule by mouth daily.    [provider]  ?aspirin EC 81 MG tablet Take 81 mg by mouth daily. Swallow whole.    [provider]  ?docusate sodium (COLACE) 100 MG capsule Take 100 mg by mouth 2 (two) times daily.    [provider]  ?empagliflozin (JARDIANCE) 10 MG TABS tablet Take 1 tablet (10 mg total) by mouth daily. 02/20/21   Joette Catching, PA-C  ?furosemide (LASIX) 40 MG tablet Take 1 tablet twice a week (Monday and Friday) 02/19/21   Joette Catching, PA-C   ?Multiple Vitamins-Minerals (MULTIVITAMIN WITH MINERALS) tablet Take 1 tablet by mouth daily.    [provider]  ?Omega-3 Fatty Acids (FISH OIL) 1000 MG CAPS Take 1,000 mg by mouth daily.    [provider]  ?potassium chloride SA (KLOR-CON M) 20 MEQ tablet Take 1 tablet twice a week with furosemide (Monday and Friday) 02/19/21   Joette Catching, PA-C  ?rosuvastatin (CRESTOR) 40 MG tablet Take 1 tablet (40 mg total) by mouth daily. 02/20/21   Joette Catching, PA-C  ?sacubitril-valsartan (ENTRESTO) 24-26 MG Take 1 tablet by mouth 2 (two) times daily. 02/19/21   Joette Catching, PA-C  ?spironolactone (ALDACTONE) 25 MG tablet TAKE 1/2 TABLET BY MOUTH DAILY 03/20/21   Bensimhon, Shaune Pascal, MD  ?   ? ?Allergies    ?Patient has no known allergies.   ? ?Review of Systems   ?Review of Systems  ?All other systems reviewed and are negative. ?Ten systems reviewed and are negative for acute change, except as noted in the HPI.   ?Physical Exam ?Updated Vital Signs ?BP 127/78   Pulse 98   Temp 98.1 ?F (36.7 ?C) (Oral)   Resp 20   Ht '5\' 10"'$  (1.778 m)   Wt 103.4 kg   SpO2 98%   BMI 32.71 kg/m?  ?Physical Exam ?Vitals and nursing note reviewed.  ?Constitutional:   ?   General: He is not in acute distress. ?   Appearance: Normal appearance.  He is well-developed. He is not ill-appearing, toxic-appearing or diaphoretic.  ?   Interventions: He is not intubated. ?HENT:  ?   Head: Normocephalic and atraumatic.  ?   Right Ear: External ear normal.  ?   Left Ear: External ear normal.  ?   Nose: Nose normal.  ?   Mouth/Throat:  ?   Mouth: Mucous membranes are moist.  ?   Pharynx: Oropharynx is clear. No oropharyngeal exudate or posterior oropharyngeal erythema.  ?Eyes:  ?   Extraocular Movements: Extraocular movements intact.  ?Cardiovascular:  ?   Rate and Rhythm: Normal rate and regular rhythm.  ?   Pulses: Normal pulses.  ?   Heart sounds: Normal heart sounds. No murmur heard. ?  No friction rub. No  gallop.  ?Pulmonary:  ?   Effort: Pulmonary effort is normal. No tachypnea, bradypnea, accessory muscle usage or respiratory distress. He is not intubated.  ?   Breath sounds: Normal breath sounds. No stridor. No decreased breath sounds, wheezing, rhonchi or rales.  ?Abdominal:  ?   General: Abdomen is flat.  ?   Tenderness: There is no abdominal tenderness.  ?Musculoskeletal:     ?   General: Normal range of motion.  ?   Cervical back: Normal range of motion and neck supple. No tenderness.  ?   Right lower leg: No edema.  ?   Left lower leg: No edema.  ?   Comments: No pedal edema.  ?Skin: ?   General: Skin is warm and dry.  ?Neurological:  ?   General: No focal deficit present.  ?   Mental Status: He is alert and oriented to person, place, and time.  ?Psychiatric:     ?   Mood and Affect: Mood normal.     ?   Behavior: Behavior normal.  ? ?ED Results / Procedures / Treatments   ?Labs ?(all labs ordered are listed, but only abnormal results are displayed) ?Labs Reviewed  ?BASIC METABOLIC PANEL - Abnormal; Notable for the following components:  ?    Result Value  ? CO2 20 (*)   ? Glucose, Bld 139 (*)   ? Creatinine, Ser 1.61 (*)   ? GFR, Estimated 45 (*)   ? All other components within normal limits  ?BRAIN NATRIURETIC PEPTIDE - Abnormal; Notable for the following components:  ? B Natriuretic Peptide 3,221.1 (*)   ? All other components within normal limits  ?CBC - Abnormal; Notable for the following components:  ? Hemoglobin 12.8 (*)   ? HCT 36.4 (*)   ? RDW 16.6 (*)   ? Platelets 125 (*)   ? All other components within normal limits  ?TROPONIN I (HIGH SENSITIVITY) - Abnormal; Notable for the following components:  ? Troponin I (High Sensitivity) 674 (*)   ? All other components within normal limits  ?TROPONIN I (HIGH SENSITIVITY)  ? ?EKG ?EKG Interpretation ? ?Date/Time:  Friday March 20 2021 30:94:07 EDT ?Ventricular Rate:  98 ?PR Interval:  155 ?QRS Duration: 186 ?QT Interval:  438 ?QTC Calculation: 560 ?R  Axis:   15 ?Text Interpretation: Sinus or ectopic atrial tachycardia IVCD, consider atypical LBBB Confirmed by Kommor, [place] ([id number]) on 03/20/2021 5:30:11 PM ? ?Radiology ?DG Chest 1 View ? ?Result Date: 03/20/2021 ?CLINICAL DATA:  Shortness of breath EXAM: CHEST  1 VIEW COMPARISON:  02/14/2021 FINDINGS: Transverse diameter of heart is increased. There is interval decrease in pulmonary vascular congestion. There are linear densities in both lower lung fields suggesting  subsegmental atelectasis. There is no significant pleural effusion or pneumothorax. IMPRESSION: Cardiomegaly. There is interval clearing of pulmonary vascular congestion. Small linear densities in the lower lung fields suggest subsegmental atelectasis. Electronically Signed   By: Elmer Picker M.D.   On: 03/20/2021 12:24  ? ?CT Angio Chest PE W and/or Wo Contrast ? ?Result Date: 03/20/2021 ?CLINICAL DATA:  Shortness of breath since Monday. EXAM: CT ANGIOGRAPHY CHEST WITH CONTRAST TECHNIQUE: Multidetector CT imaging of the chest was performed using the standard protocol during bolus administration of intravenous contrast. Multiplanar CT image reconstructions and MIPs were obtained to evaluate the vascular anatomy. RADIATION DOSE REDUCTION: This exam was performed according to the departmental dose-optimization program which includes automated exposure control, adjustment of the mA and/or kV according to patient size and/or use of iterative reconstruction technique. CONTRAST:  141m OMNIPAQUE IOHEXOL 350 MG/ML SOLN COMPARISON:  Chest x-ray from same day. FINDINGS: Cardiovascular: Satisfactory opacification of the pulmonary arteries to the segmental level. No evidence of pulmonary embolism. Dilated main pulmonary artery measuring 3.8 cm in diameter. Cardiomegaly with severe left ventricular dilatation. No pericardial effusion. No thoracic aortic aneurysm. Reflux of contrast into the IVC. Prominent pulmonary vasculature. Mediastinum/Nodes: No enlarged  mediastinal, hilar, or axillary lymph nodes. Thyroid gland, trachea, and esophagus demonstrate no significant findings. Lungs/Pleura: Few small ground-glass densities in the left upper lobe (series 6, images 30 and 54

## 2021-03-20 NOTE — Consult Note (Signed)
?  ?Advanced Heart Failure Team Consult Note ? ? ?Primary Physician: [facility] ?PCP-Cardiologist:  None ? ?Reason for Consultation: Low output HF ? ?HPI:   ? ?Bryce Cain is seen today for evaluation of low output HF at the request of Dr. Johnsie Cancel.  ? ?Bryce Cain is a 71 y.o. retired Financial controller from [employer] with DM2, HTN and OSA admitted in 2/23 with acute systolic CHF (new).  ? ? Echo with EF 20-25% with anterior/apical WMA suggestive of previous anterior MI. Moderate MR. RV normal. R/LHC with minimal nonobstructive CAD with separate left coronary ostia, elevated filling pressures and normal CO.  ? ?RA =  10 ?RV = 47/17 ?PA = 52/22 (33) ?PCW = 25 ?Fick cardiac output/index = 5.5/2.5 ?PVR =1.5 WU ?Ao sat = 98% ?PA sat = 65%, 66% ?  ?cMRI demonstrated LVEF 16%, RVEF 52%, LGE basal septal midwall (scar pattern seen with NICM). Cardiomyopathy felt to be likely due to LBBB. EP consulted. Recommended optimal medical therapy for 2-3 months. If EF remained persistently reduced, plan for CRT-D.  ? ?Returns today with recurrent HF symptomatology. SOB on mild exertion, poor appetite and fatigue. H[place] 674. CT in ER - No PE ? ?BNP 707 -> 3,221  SCr 1.4 -> 1.6 ? ? ?Review of Systems: [y] = yes, '[ ]'$  = no  ? ?General: Weight gain '[ ]'$ ; Weight loss '[ ]'$ ; Anorexia '[ ]'$ ; Fatigue [ y]; Fever '[ ]'$ ; Chills '[ ]'$ ; Weakness '[ ]'$   ?Cardiac: Chest pain/pressure '[ ]'$ ; Resting SOB '[ ]'$ ; Exertional SOB Blue.Reese ]; Orthopnea '[ ]'$ ; Pedal Edema [ y]; Palpitations '[ ]'$ ; Syncope '[ ]'$ ; Presyncope '[ ]'$ ; Paroxysmal nocturnal dyspnea'[ ]'$   ?Pulmonary: Cough '[ ]'$ ; Wheezing'[ ]'$ ; Hemoptysis'[ ]'$ ; Sputum '[ ]'$ ; Snoring '[ ]'$   ?GI: Vomiting'[ ]'$ ; Dysphagia'[ ]'$ ; Melena'[ ]'$ ; Hematochezia '[ ]'$ ; Heartburn'[ ]'$ ; Abdominal pain '[ ]'$ ; Constipation '[ ]'$ ; Diarrhea '[ ]'$ ; BRBPR '[ ]'$   ?GU: Hematuria'[ ]'$ ; Dysuria '[ ]'$ ; Nocturia'[ ]'$   ?Vascular: Pain in legs with walking '[ ]'$ ; Pain in feet with lying flat '[ ]'$ ; Non-healing sores '[ ]'$ ; Stroke '[ ]'$ ; TIA '[ ]'$ ; Slurred speech '[ ]'$ ;  ?Neuro:  Headaches'[ ]'$ ; Vertigo'[ ]'$ ; Seizures'[ ]'$ ; Paresthesias'[ ]'$ ;Blurred vision '[ ]'$ ; Diplopia '[ ]'$ ; Vision changes '[ ]'$   ?Ortho/Skin: Arthritis [ y]; Joint pain [ y]; Muscle pain '[ ]'$ ; Joint swelling '[ ]'$ ; Back Pain '[ ]'$ ; Rash '[ ]'$   ?Psych: Depression'[ ]'$ ; Anxiety'[ ]'$   ?Heme: Bleeding problems '[ ]'$ ; Clotting disorders '[ ]'$ ; Anemia '[ ]'$   ?Endocrine: Diabetes '[ ]'$ ; Thyroid dysfunction'[ ]'$  ? ?Home Medications ?Prior to Admission medications   ?Medication Sig Start Date End Date Taking? Authorizing Provider  ?acidophilus (RISAQUAD) CAPS capsule Take 1 capsule by mouth daily.    [provider]  ?aspirin EC 81 MG tablet Take 81 mg by mouth daily. Swallow whole.    [provider]  ?docusate sodium (COLACE) 100 MG capsule Take 100 mg by mouth 2 (two) times daily.    [provider]  ?empagliflozin (JARDIANCE) 10 MG TABS tablet Take 1 tablet (10 mg total) by mouth daily. 02/20/21   Joette Catching, PA-C  ?furosemide (LASIX) 40 MG tablet Take 1 tablet twice a week (Monday and Friday) 02/19/21   Joette Catching, PA-C  ?Multiple Vitamins-Minerals (MULTIVITAMIN WITH MINERALS) tablet Take 1 tablet by mouth daily.    [provider]  ?Omega-3 Fatty Acids (FISH OIL) 1000 MG CAPS Take 1,000 mg by mouth daily.  [provider]  ?potassium chloride SA (KLOR-CON M) 20 MEQ tablet Take 1 tablet twice a week with furosemide (Monday and Friday) 02/19/21   Joette Catching, PA-C  ?rosuvastatin (CRESTOR) 40 MG tablet Take 1 tablet (40 mg total) by mouth daily. 02/20/21   Joette Catching, PA-C  ?sacubitril-valsartan (ENTRESTO) 24-26 MG Take 1 tablet by mouth 2 (two) times daily. 02/19/21   Joette Catching, PA-C  ?spironolactone (ALDACTONE) 25 MG tablet TAKE 1/2 TABLET BY MOUTH DAILY 03/20/21   Malani Lees, Shaune Pascal, MD  ? ? ?Past Medical History: ?Past Medical History:  ?Diagnosis Date  ? DM (diabetes mellitus), type 2 ([place])   ? OSA on CPAP   ? ? ?Past Surgical History: ?Past Surgical History:   ?Procedure Laterality Date  ? RIGHT/LEFT HEART CATH AND CORONARY ANGIOGRAPHY N/A 02/16/2021  ? Procedure: RIGHT/LEFT HEART CATH AND CORONARY ANGIOGRAPHY;  Surgeon: Jolaine Artist, MD;  Location: [facility];  Service: Cardiovascular;  Laterality: N/A;  ? ? ?Family History: ?Family History  ?Problem Relation Age of Onset  ? Heart attack Father   ? ? ?Social History: ?Social History  ? ?Socioeconomic History  ? Marital status: Married  ?  Spouse name: Not on file  ? Number of children: Not on file  ? Years of education: Not on file  ? Highest education level: Not on file  ?Occupational History  ? Not on file  ?Tobacco Use  ? Smoking status: Former  ?  Types: Pipe  ?  Quit date: 02/05/1991  ?  Years since quitting: 30.1  ? Smokeless tobacco: Not on file  ?Substance and Sexual Activity  ? Alcohol use: Not on file  ? Drug use: Not on file  ? Sexual activity: Not on file  ?Other Topics Concern  ? Not on file  ?Social History Narrative  ? Not on file  ? ?Social Determinants of Health  ? ?Financial Resource Strain: Not on file  ?Food Insecurity: Not on file  ?Transportation Needs: Not on file  ?Physical Activity: Not on file  ?Stress: Not on file  ?Social Connections: Not on file  ? ? ?Allergies:  ?No Known Allergies ? ?Objective:   ? ?Vital Signs:   ?Temp:  [97.6 ?F (36.4 ?C)-98.1 ?F (36.7 ?C)] 98.1 ?F (36.7 ?C) (03/17 1534) ?Pulse Rate:  [85-134] 102 (03/17 1830) ?Resp:  [18-23] 18 (03/17 1830) ?BP: (123-131)/(77-91) 129/85 (03/17 1830) ?SpO2:  [95 %-98 %] 98 % (03/17 1830) ?Weight:  [103.4 kg] 103.4 kg (03/17 1202) ?  ? ?Weight change: ?Filed Weights  ? 03/20/21 1202  ?Weight: 103.4 kg  ? ? ?Intake/Output:  ?No intake or output data in the 24 hours ending 03/20/21 1919  ? ? ?Physical Exam  ?  ?General:  Sitting up in bed. No resp difficulty ?HEENT: normal ?Neck: supple. JVP to jaw with prominent v-wave Carotids 2+ bilat; no bruits. No lymphadenopathy or thyromegaly appreciated. ?Cor: PMI laterally displaced.  Tachy regular + s3 ?Lungs: crackles in bases ?Abdomen: obese soft, nontender, nondistended. No hepatosplenomegaly. No bruits or masses. Good bowel sounds. ?Extremities: no cyanosis, clubbing, rash, tr edema  cool  ?Neuro: alert & orientedx3, cranial nerves grossly intact. moves all 4 extremities w/o difficulty. Affect pleasant ? ? ?Telemetry  ? ?Sinus 95-110 Personally reviewed ? ?EKG  ?  ?Sinus 98 LBBB '155mg'$  + 2 PVCs Personally reviewed ? ?Labs  ? ?Basic Metabolic Panel: ?Recent Labs  ?Lab 03/20/21 ?1202  ?NA 140  ?K 3.9  ?CL 106  ?  CO2 20*  ?GLUCOSE 139*  ?BUN 18  ?CREATININE 1.61*  ?CALCIUM 10.0  ? ? ?Liver Function Tests: ?No results for input(s): AST, ALT, ALKPHOS, BILITOT, PROT, ALBUMIN in the last 168 hours. ?No results for input(s): LIPASE, AMYLASE in the last 168 hours. ?No results for input(s): AMMONIA in the last 168 hours. ? ?CBC: ?Recent Labs  ?Lab 03/20/21 ?1202  ?WBC 5.0  ?HGB 12.8*  ?HCT 36.4*  ?MCV 80.5  ?PLT 125*  ? ? ?Cardiac Enzymes: ?No results for input(s): CKTOTAL, CKMB, CKMBINDEX, TROPONINI in the last 168 hours. ? ?BNP: ?BNP (last 3 results) ?Recent Labs  ?  02/14/21 ?9563 03/20/21 ?1202  ?BNP 707.0* 3,221.1*  ? ? ?ProBNP (last 3 results) ?No results for input(s): PROBNP in the last 8760 hours. ? ? ?CBG: ?No results for input(s): GLUCAP in the last 168 hours. ? ?Coagulation Studies: ?No results for input(s): LABPROT, INR in the last 72 hours. ? ? ?Imaging  ? ?DG Chest 1 View ? ?Result Date: 03/20/2021 ?CLINICAL DATA:  Shortness of breath EXAM: CHEST  1 VIEW COMPARISON:  02/14/2021 FINDINGS: Transverse diameter of heart is increased. There is interval decrease in pulmonary vascular congestion. There are linear densities in both lower lung fields suggesting subsegmental atelectasis. There is no significant pleural effusion or pneumothorax. IMPRESSION: Cardiomegaly. There is interval clearing of pulmonary vascular congestion. Small linear densities in the lower lung fields suggest subsegmental  atelectasis. Electronically Signed   By: Elmer Picker M.D.   On: 03/20/2021 12:24  ? ?CT Angio Chest PE W and/or Wo Contrast ? ?Result Date: 03/20/2021 ?CLINICAL DATA:  Shortness of breath since Monday.

## 2021-03-20 NOTE — ED Notes (Signed)
Patient transported to CT 

## 2021-03-20 NOTE — ED Notes (Signed)
Out to XR for line placement. ?

## 2021-03-20 NOTE — ED Triage Notes (Signed)
Pt arrived POV from home c/o labored breathing since Monday and per family member just has not gotten better. Pt denies any pain at this time.  ?

## 2021-03-20 NOTE — Progress Notes (Signed)
Peripherally Inserted Central Catheter Placement ? ?The IV Nurse has discussed with the patient and/or persons authorized to consent for the patient, the purpose of this procedure and the potential benefits and risks involved with this procedure.  The benefits include less needle sticks, lab draws from the catheter, and the patient may be discharged home with the catheter. Risks include, but not limited to, infection, bleeding, blood clot (thrombus formation), and puncture of an artery; nerve damage and irregular heartbeat and possibility to perform a PICC exchange if needed/ordered by physician.  Alternatives to this procedure were also discussed.  Bard Power PICC patient education guide, fact sheet on infection prevention and patient information card has been provided to patient /or left at bedside.   ? ?PICC Placement Documentation  ?PICC Double Lumen 48/01/65 Right Basilic 42 cm 0 cm (Active)  ?Indication for Insertion or Continuance of Line Vasoactive infusions 03/20/21 2023  ?Exposed Catheter (cm) 0 cm 03/20/21 2023  ?Site Assessment Clean, Dry, Intact 03/20/21 2023  ?Lumen #1 Status Saline locked;Blood return noted 03/20/21 2023  ?Lumen #2 Status Saline locked;Blood return noted 03/20/21 2023  ?Dressing Type Securing device;Transparent 03/20/21 2023  ?Dressing Status Antimicrobial disc in place 03/20/21 2023  ?Safety Lock Not Applicable 53/74/82 7078  ?Line Care Connections checked and tightened 03/20/21 2023  ?Line Adjustment (NICU/IV Team Only) No 03/20/21 2023  ?Dressing Intervention New dressing 03/20/21 2023  ?Dressing Change Due 03/27/21 03/20/21 2023  ? ? ? ? ? ?Rosalio Macadamia Chenice ?03/20/2021, 8:25 PM ? ?

## 2021-03-20 NOTE — H&P (Signed)
Physician History and Physical  ? ? ? ?Patient ID: ?Bryce Cain ?MRN: [id number] ?DOB/AGE: [birth year] 71 y.o. ?Admit date: 03/20/2021 ? ?[facility] Care Physician: [facility] ?Primary Cardiologist: [facility] ? ?Active Problems: ?  * No active hospital problems. * ? ? ?HPI:  71 y.o. seen in ED at request of [facility] for CHF. Retired [employer] professor With new diagnosis of severe non ischemic DCM 02/14/21. On good GDMT. Compliant with meds Weight up 8 lbs. PND / orthopnea and decreased exercise tolerance with dyspnea last week. Potassium pills making him feel sick ?Echo 02/15/21 EF 20-25% normal RV mild / mod MR Cath 02/16/21 no significant CAD Elevated PCWP 25 with normal CO.  PA pressure 52/22 mmHg. MRI 02/17/21 EF 16% no infiltration CXR atelectasis CTA negative PE  BNP 3221 Cr up 1/61 CO2 down 20.   ? ?Review of systems complete and found to be negative unless listed above  ? ?Past Medical History:  ?Diagnosis Date  ? DM (diabetes mellitus), type 2 ([place])   ? OSA on CPAP   ?  ?Family History  ?Problem Relation Age of Onset  ? Heart attack Father   ?  ?Social History  ? ?Socioeconomic History  ? Marital status: Married  ?  Spouse name: Not on file  ? Number of children: Not on file  ? Years of education: Not on file  ? Highest education level: Not on file  ?Occupational History  ? Not on file  ?Tobacco Use  ? Smoking status: Former  ?  Types: Pipe  ?  Quit date: 02/05/1991  ?  Years since quitting: 30.1  ? Smokeless tobacco: Not on file  ?Substance and Sexual Activity  ? Alcohol use: Not on file  ? Drug use: Not on file  ? Sexual activity: Not on file  ?Other Topics Concern  ? Not on file  ?Social History Narrative  ? Not on file  ? ?Social Determinants of Health  ? ?Financial Resource Strain: Not on file  ?Food Insecurity: Not on file  ?Transportation Needs: Not on file  ?Physical Activity: Not on file  ?Stress: Not on file  ?Social Connections: Not on file  ?Intimate Partner Violence: Not on file  ?   ?Past Surgical History:  ?Procedure Laterality Date  ? RIGHT/LEFT HEART CATH AND CORONARY ANGIOGRAPHY N/A 02/16/2021  ? Procedure: RIGHT/LEFT HEART CATH AND CORONARY ANGIOGRAPHY;  Surgeon: Jolaine Artist, MD;  Location: [facility];  Service: Cardiovascular;  Laterality: N/A;  ?  ? ?(Not in a hospital admission) ? ? ?Physical Exam: ?Blood pressure 127/78, pulse 98, temperature 98.1 ?F (36.7 ?C), temperature source Oral, resp. rate 20, height '5\' 10"'$  (1.778 m), weight 103.4 kg, SpO2 98 %.   ? ?Obese black male ?Tachypnic ?JVP elvated with V wave and HJR ?No obvious murmur ?Basilar rales ?Plus one edema ?Abdomen benign  ? ?No current facility-administered medications on file prior to encounter.  ? ?Current Outpatient Medications on File Prior to Encounter  ?Medication Sig Dispense Refill  ? acidophilus (RISAQUAD) CAPS capsule Take 1 capsule by mouth daily.    ? aspirin EC 81 MG tablet Take 81 mg by mouth daily. Swallow whole.    ? docusate sodium (COLACE) 100 MG capsule Take 100 mg by mouth 2 (two) times daily.    ? empagliflozin (JARDIANCE) 10 MG TABS tablet Take 1 tablet (10 mg total) by mouth daily. 30 tablet 1  ? furosemide (LASIX) 40 MG tablet Take 1 tablet twice a  week (Monday and Friday) 15 tablet 1  ? Multiple Vitamins-Minerals (MULTIVITAMIN WITH MINERALS) tablet Take 1 tablet by mouth daily.    ? Omega-3 Fatty Acids (FISH OIL) 1000 MG CAPS Take 1,000 mg by mouth daily.    ? potassium chloride SA (KLOR-CON M) 20 MEQ tablet Take 1 tablet twice a week with furosemide (Monday and Friday) 15 tablet 1  ? rosuvastatin (CRESTOR) 40 MG tablet Take 1 tablet (40 mg total) by mouth daily. 30 tablet 1  ? sacubitril-valsartan (ENTRESTO) 24-26 MG Take 1 tablet by mouth 2 (two) times daily. 60 tablet 1  ? spironolactone (ALDACTONE) 25 MG tablet TAKE 1/2 TABLET BY MOUTH DAILY 45 tablet 3  ? ? ?Labs: ?  ?Lab Results  ?Component Value Date  ? WBC 5.0 03/20/2021  ? HGB 12.8 (L) 03/20/2021  ? HCT 36.4 (L) 03/20/2021  ?  MCV 80.5 03/20/2021  ? PLT 125 (L) 03/20/2021  ?  ?Recent Labs  ?Lab 03/20/21 ?1202  ?NA 140  ?K 3.9  ?CL 106  ?CO2 20*  ?BUN 18  ?CREATININE 1.61*  ?CALCIUM 10.0  ?GLUCOSE 139*  ? ?No results found for: CKTOTAL, CKMB, CKMBINDEX, TROPONINI ?  ?Lab Results  ?Component Value Date  ? CHOL 137 02/15/2021  ? ?Lab Results  ?Component Value Date  ? HDL 55 02/15/2021  ? ?Lab Results  ?Component Value Date  ? Moro 74 02/15/2021  ? ?Lab Results  ?Component Value Date  ? TRIG 41 02/15/2021  ? ?Lab Results  ?Component Value Date  ? CHOLHDL 2.5 02/15/2021  ? ?No results found for: LDLDIRECT  ? ?  ?Radiology: ?DG Chest 1 View ? ?Result Date: 03/20/2021 ?CLINICAL DATA:  Shortness of breath EXAM: CHEST  1 VIEW COMPARISON:  02/14/2021 FINDINGS: Transverse diameter of heart is increased. There is interval decrease in pulmonary vascular congestion. There are linear densities in both lower lung Cain suggesting subsegmental atelectasis. There is no significant pleural effusion or pneumothorax. IMPRESSION: Cardiomegaly. There is interval clearing of pulmonary vascular congestion. Small linear densities in the lower lung Cain suggest subsegmental atelectasis. Electronically Signed   By: Elmer Picker M.D.   On: 03/20/2021 12:24  ? ?CT Angio Chest PE W and/or Wo Contrast ? ?Result Date: 03/20/2021 ?CLINICAL DATA:  Shortness of breath since Monday. EXAM: CT ANGIOGRAPHY CHEST WITH CONTRAST TECHNIQUE: Multidetector CT imaging of the chest was performed using the standard protocol during bolus administration of intravenous contrast. Multiplanar CT image reconstructions and MIPs were obtained to evaluate the vascular anatomy. RADIATION DOSE REDUCTION: This exam was performed according to the departmental dose-optimization program which includes automated exposure control, adjustment of the mA and/or kV according to patient size and/or use of iterative reconstruction technique. CONTRAST:  130m OMNIPAQUE IOHEXOL 350 MG/ML SOLN  COMPARISON:  Chest x-ray from same day. FINDINGS: Cardiovascular: Satisfactory opacification of the pulmonary arteries to the segmental level. No evidence of pulmonary embolism. Dilated main pulmonary artery measuring 3.8 cm in diameter. Cardiomegaly with severe left ventricular dilatation. No pericardial effusion. No thoracic aortic aneurysm. Reflux of contrast into the IVC. Prominent pulmonary vasculature. Mediastinum/Nodes: No enlarged mediastinal, hilar, or axillary lymph nodes. Thyroid gland, trachea, and esophagus demonstrate no significant findings. Lungs/Pleura: Few small ground-glass densities in the left upper lobe (series 6, images 30 and 54) and right upper lobe (series 6, image 42). Mild bibasilar subsegmental atelectasis. No focal consolidation, pleural effusion, or pneumothorax. Upper Abdomen: No acute abnormality. Renal asymmetry with enlarged right kidney compared to the left. Musculoskeletal: No chest  wall abnormality. No acute or significant osseous findings. Review of the MIP images confirms the above findings. IMPRESSION: 1. No evidence of pulmonary embolism. 2. Few small ground-glass densities in the left upper lobe and right upper lobe, nonspecific, but favored residual pulmonary edema, less likely infectious or inflammatory. 3. Dilated main pulmonary artery, suggestive of pulmonary arterial hypertension. Evidence of right heart dysfunction. 4. Renal asymmetry with enlarged right kidney compared to the left. An underlying right renal mass is not excluded. Recommend further evaluation with renal ultrasound or contrast-enhanced CT of the abdomen. Electronically Signed   By: Titus Dubin M.D.   On: 03/20/2021 17:36   ? ?EKG: ST LBBB chronic  ? ?ASSESSMENT AND PLAN: ? ? 1. CHF:  acute on chronic class 3 systolic CHF On good GDMT but relapse since hospital d/c. Clinically high PCWP low output. Contrast load given by ER for PE study won't help Cr and bicarb low. Will continue home meds May need  to hold aldactone in am if renal function worsens  PIC line and milrinone indicated for shock physiology . Note made for CRT-D but has only been on GDMT for 3 weeks.  ? ? ?Signed: ?Kalandra Masters Nishan3/17/2023, 6:34 PM

## 2021-03-20 NOTE — ED Provider Triage Note (Signed)
Emergency Medicine Provider Triage Evaluation Note ? ?BENEDICT KUE , a 71 y.o. male  was evaluated in triage.  Pt complains of worsening SHOB since Monday. Was admitted one month ago with new onset CHF with systolic dysfunction. Started on lasix which he has been taking as prescribed. Denies chest pain, pressure. No nausea, vomiting, fever. No LLE. Endorses abdominal edema with previous admission, denies significant distention at this time. ? ?Review of Systems  ?Positive: shob ?Negative: Chest pain, LLE ? ?Physical Exam  ?BP (!) 131/91 (BP Location: Left Arm)   Pulse (!) 134   Temp 97.6 ?F (36.4 ?C) (Oral)   Resp 20   SpO2 95%  ?Gen:   Awake, no distress   ?Resp:  Slightly increased effort, pausing between word ?MSK:   Moves extremities without difficulty  ?Other:  No ttp chest wall, abdomen ?Tachycardic on arrival ? ?Medical Decision Making  ?Medically screening exam initiated at 12:02 PM.  Appropriate orders placed.  [name] was informed that the remainder of the evaluation will be completed by another provider, this initial triage assessment does not replace that evaluation, and the importance of remaining in the ED until their evaluation is complete. ? ?Workup initiated ?  ?Anselmo Pickler, PA-C ?03/20/21 1204 ? ?

## 2021-03-21 ENCOUNTER — Inpatient Hospital Stay (HOSPITAL_COMMUNITY): Payer: Medicare PPO

## 2021-03-21 DIAGNOSIS — I5021 Acute systolic (congestive) heart failure: Secondary | ICD-10-CM | POA: Diagnosis not present

## 2021-03-21 LAB — BASIC METABOLIC PANEL
Anion gap: 13 (ref 5–15)
BUN: 19 mg/dL (ref 8–23)
CO2: 21 mmol/L — ABNORMAL LOW (ref 22–32)
Calcium: 9.8 mg/dL (ref 8.9–10.3)
Chloride: 108 mmol/L (ref 98–111)
Creatinine, Ser: 1.46 mg/dL — ABNORMAL HIGH (ref 0.61–1.24)
GFR, Estimated: 51 mL/min — ABNORMAL LOW (ref >60)
Glucose, Bld: 90 mg/dL (ref 70–99)
Potassium: 3.9 mmol/L (ref 3.5–5.1)
Sodium: 142 mmol/L (ref 135–145)

## 2021-03-21 LAB — ECHOCARDIOGRAM COMPLETE
Area-P 1/2: 5.38 cm2
Height: 70 in
MV M vel: 4.4 m/s
MV Peak grad: 77.4 mmHg
S' Lateral: 5.9 cm
Weight: 3580.27 oz

## 2021-03-21 LAB — COOXEMETRY PANEL
Carboxyhemoglobin: 1.5 % (ref 0.5–1.5)
Methemoglobin: <0.7 % (ref 0.0–1.5)
O2 Saturation: 68.2 %
Total hemoglobin: 12.1 g/dL (ref 12.0–16.0)

## 2021-03-21 LAB — MRSA NEXT GEN BY PCR, NASAL: MRSA by PCR Next Gen: NOT DETECTED

## 2021-03-21 MED ORDER — POTASSIUM CHLORIDE CRYS ER 20 MEQ PO TBCR
20.0000 meq | EXTENDED_RELEASE_TABLET | Freq: Once | ORAL | Status: AC
  Administered 2021-03-21: 20 meq via ORAL
  Filled 2021-03-21: qty 1

## 2021-03-21 NOTE — Plan of Care (Signed)

## 2021-03-21 NOTE — Progress Notes (Signed)
? ? ?Advanced Heart Failure Rounding Note ? ? ?Subjective:   ? ?Admitted last night with recurrent HF  ? ?PICC placed. Co-ox 72% ? ?Remains on milrinone 0.25.  Out 1.8L overnight. Weight unchanged. CVP 3-4   ? ?Feeling better. Scr 1.6 -> 1.4 ? ? ?Objective:   ?Weight Range: ? ?Vital Signs:   ?Temp:  [97.6 ?F (36.4 ?C)-98.7 ?F (37.1 ?C)] 97.7 ?F (36.5 ?C) (03/18 0741) ?Pulse Rate:  [78-134] 100 (03/18 0741) ?Resp:  [16-23] 19 (03/18 0741) ?BP: (117-138)/(72-97) 117/72 (03/18 0741) ?SpO2:  [93 %-100 %] 97 % (03/18 0741) ?Weight:  [101.4 kg-103.4 kg] 101.5 kg (03/18 0316) ?Last BM Date : 03/20/21 ? ?Weight change: ?Filed Weights  ? 03/20/21 1202 03/20/21 2241 03/21/21 0316  ?Weight: 103.4 kg 101.4 kg 101.5 kg  ? ? ?Intake/Output:  ? ?Intake/Output Summary (Last 24 hours) at 03/21/2021 0813 ?Last data filed at 03/21/2021 0746 ?Gross per 24 hour  ?Intake 397.94 ml  ?Output 1800 ml  ?Net -1402.06 ml  ?  ? ?Physical Exam: ?General:  Sitting up in bed  No resp difficulty ?HEENT: normal ?Neck: supple. JVP flat. Carotids 2+ bilat; no bruits. No lymphadenopathy or thryomegaly appreciated. ?Cor: PMI nondisplaced. Regular rate & rhythm. No rubs, gallops or murmurs. ?Lungs: clear ?Abdomen: soft, nontender, nondistended. No hepatosplenomegaly. No bruits or masses. Good bowel sounds. ?Extremities: no cyanosis, clubbing, rash, edema ?Neuro: alert & orientedx3, cranial nerves grossly intact. moves all 4 extremities w/o difficulty. Affect pleasant ? ?Telemetry: Sinus 90-100 Personally reviewed ? ?Labs: ?Basic Metabolic Panel: ?Recent Labs  ?Lab 03/20/21 ?1202 03/21/21 ?0318  ?NA 140 142  ?K 3.9 3.9  ?CL 106 108  ?CO2 20* 21*  ?GLUCOSE 139* 90  ?BUN 18 19  ?CREATININE 1.61* 1.46*  ?CALCIUM 10.0 9.8  ? ? ?Liver Function Tests: ?No results for input(s): AST, ALT, ALKPHOS, BILITOT, PROT, ALBUMIN in the last 168 hours. ?No results for input(s): LIPASE, AMYLASE in the last 168 hours. ?No results for input(s): AMMONIA in the last 168  hours. ? ?CBC: ?Recent Labs  ?Lab 03/20/21 ?1202  ?WBC 5.0  ?HGB 12.8*  ?HCT 36.4*  ?MCV 80.5  ?PLT 125*  ? ? ?Cardiac Enzymes: ?No results for input(s): CKTOTAL, CKMB, CKMBINDEX, TROPONINI in the last 168 hours. ? ?BNP: ?BNP (last 3 results) ?Recent Labs  ?  02/14/21 ?6144 03/20/21 ?1202  ?BNP 707.0* 3,221.1*  ? ? ?ProBNP (last 3 results) ?No results for input(s): PROBNP in the last 8760 hours. ? ? ? ?Other results: ? ?Imaging: ?DG Chest 1 View ? ?Result Date: 03/20/2021 ?CLINICAL DATA:  Status post PICC.  Central line placement. EXAM: CHEST  1 VIEW COMPARISON:  AP chest earlier same day FINDINGS: New right upper extremity PICC tip overlies the central superior vena cava. Cardiac silhouette is again moderately markedly enlarged. Mediastinal contours are grossly within normal limits. Moderately decreased lung volumes with bibasilar horizontal linear subsegmental atelectasis. No pleural effusion or pneumothorax. No acute skeletal abnormality. IMPRESSION: New right upper extremity PICC tip overlies the central superior vena cava. No significant change in cardiomegaly and bibasilar subsegmental atelectasis. Electronically Signed   By: Yvonne Kendall M.D.   On: 03/20/2021 21:41  ? ?DG Chest 1 View ? ?Result Date: 03/20/2021 ?CLINICAL DATA:  Shortness of breath EXAM: CHEST  1 VIEW COMPARISON:  02/14/2021 FINDINGS: Transverse diameter of heart is increased. There is interval decrease in pulmonary vascular congestion. There are linear densities in both lower lung fields suggesting subsegmental atelectasis. There is no significant pleural effusion  or pneumothorax. IMPRESSION: Cardiomegaly. There is interval clearing of pulmonary vascular congestion. Small linear densities in the lower lung fields suggest subsegmental atelectasis. Electronically Signed   By: Elmer Picker M.D.   On: 03/20/2021 12:24  ? ?CT Angio Chest PE W and/or Wo Contrast ? ?Result Date: 03/20/2021 ?CLINICAL DATA:  Shortness of breath since Monday.  EXAM: CT ANGIOGRAPHY CHEST WITH CONTRAST TECHNIQUE: Multidetector CT imaging of the chest was performed using the standard protocol during bolus administration of intravenous contrast. Multiplanar CT image reconstructions and MIPs were obtained to evaluate the vascular anatomy. RADIATION DOSE REDUCTION: This exam was performed according to the departmental dose-optimization program which includes automated exposure control, adjustment of the mA and/or kV according to patient size and/or use of iterative reconstruction technique. CONTRAST:  117m OMNIPAQUE IOHEXOL 350 MG/ML SOLN COMPARISON:  Chest x-ray from same day. FINDINGS: Cardiovascular: Satisfactory opacification of the pulmonary arteries to the segmental level. No evidence of pulmonary embolism. Dilated main pulmonary artery measuring 3.8 cm in diameter. Cardiomegaly with severe left ventricular dilatation. No pericardial effusion. No thoracic aortic aneurysm. Reflux of contrast into the IVC. Prominent pulmonary vasculature. Mediastinum/Nodes: No enlarged mediastinal, hilar, or axillary lymph nodes. Thyroid gland, trachea, and esophagus demonstrate no significant findings. Lungs/Pleura: Few small ground-glass densities in the left upper lobe (series 6, images 30 and 54) and right upper lobe (series 6, image 42). Mild bibasilar subsegmental atelectasis. No focal consolidation, pleural effusion, or pneumothorax. Upper Abdomen: No acute abnormality. Renal asymmetry with enlarged right kidney compared to the left. Musculoskeletal: No chest wall abnormality. No acute or significant osseous findings. Review of the MIP images confirms the above findings. IMPRESSION: 1. No evidence of pulmonary embolism. 2. Few small ground-glass densities in the left upper lobe and right upper lobe, nonspecific, but favored residual pulmonary edema, less likely infectious or inflammatory. 3. Dilated main pulmonary artery, suggestive of pulmonary arterial hypertension. Evidence of  right heart dysfunction. 4. Renal asymmetry with enlarged right kidney compared to the left. An underlying right renal mass is not excluded. Recommend further evaluation with renal ultrasound or contrast-enhanced CT of the abdomen. Electronically Signed   By: WTitus DubinM.D.   On: 03/20/2021 17:36  ? ?[place]Renal ? ?Result Date: 03/20/2021 ?CLINICAL DATA:  Renal asymmetry on CT chest from same day. EXAM: RENAL / URINARY TRACT ULTRASOUND COMPLETE COMPARISON:  CT chest from same day. FINDINGS: Right Kidney: Renal measurements: 14.0 x 7.3 x 6.5 cm = volume: 347 mL. There is an 8.3 x 7.0 x 7.6 cm solid mass centered within the midpole. There are two simple cysts arising from the lower pole measuring up to 7.7 cm. Left Kidney: Renal measurements: 13.0 x 6.7 x 5.9 cm = volume: 267 mL. Echogenicity within normal limits. No mass or hydronephrosis visualized. There are two simple cysts measuring up to 3.1 cm. Bladder: Appears normal for degree of bladder distention. Other: None. IMPRESSION: 1. Large 8.3 cm solid mass centered within the right kidney, highly concerning for renal cell carcinoma. Further characterization with non-emergent renal protocol CT or MRI of the abdomen with and without contrast is recommended. Electronically Signed   By: WTitus DubinM.D.   On: 03/20/2021 19:52  ? ?[place]KG SITE RITE ? ?Result Date: 03/20/2021 ?If SOccidental Petroleumnot attached, placement could not be confirmed due to current cardiac rhythm.  ? ? ?Medications:   ? ? ?Scheduled Medications: ? aspirin EC  81 mg Oral Daily  ? Chlorhexidine Gluconate Cloth  6 each Topical  Daily  ? empagliflozin  10 mg Oral Daily  ? furosemide  60 mg Intravenous BID  ? heparin  5,000 Units Subcutaneous Q8H  ? rosuvastatin  40 mg Oral Daily  ? sacubitril-valsartan  1 tablet Oral BID  ? sodium chloride flush  10-40 mL Intracatheter Q12H  ? sodium chloride flush  3 mL Intravenous Q12H  ? spironolactone  12.5 mg Oral Daily  ? ? ?Infusions: ? sodium chloride    ?  milrinone 0.25 mcg/kg/min (03/21/21 0300)  ? ? ?PRN Medications: ?sodium chloride, acetaminophen, ondansetron (ZOFRAN) IV, sodium chloride flush, sodium chloride flush ? ? ?Assessment/Plan:  ? ?1. Acute on chronic

## 2021-03-21 NOTE — Progress Notes (Signed)
*  PRELIMINARY RESULTS* ?Echocardiogram ?2D Echocardiogram has been performed. ? ?Bryce Cain ?03/21/2021, 1:21 PM ?

## 2021-03-21 NOTE — Progress Notes (Signed)
?  Transition of Care (TOC) Screening Note ? ? ?Patient Details  ?Name: Bryce Cain ?Date of Birth: [birth year] ? ? ?Transition of Care (TOC) CM/SW Contact:    ?Bary Castilla, LCSW ?Phone Number: ?03/21/2021, 12:25 PM ? ? ? ?Transition of Care Department [facility]) has reviewed patient and no TOC needs have been identified at this time. We will continue to monitor patient advancement through interdisciplinary progression rounds. If new patient transition needs arise, please place a TOC consult. ?  ?

## 2021-03-22 LAB — BASIC METABOLIC PANEL
Anion gap: 8 (ref 5–15)
BUN: 16 mg/dL (ref 8–23)
CO2: 23 mmol/L (ref 22–32)
Calcium: 9.1 mg/dL (ref 8.9–10.3)
Chloride: 107 mmol/L (ref 98–111)
Creatinine, Ser: 1.27 mg/dL — ABNORMAL HIGH (ref 0.61–1.24)
GFR, Estimated: >60 mL/min (ref >60)
Glucose, Bld: 110 mg/dL — ABNORMAL HIGH (ref 70–99)
Potassium: 3.6 mmol/L (ref 3.5–5.1)
Sodium: 138 mmol/L (ref 135–145)

## 2021-03-22 LAB — COOXEMETRY PANEL
Carboxyhemoglobin: 1.4 % (ref 0.5–1.5)
Methemoglobin: <0.7 % (ref 0.0–1.5)
O2 Saturation: 66.2 %
Total hemoglobin: 11.9 g/dL — ABNORMAL LOW (ref 12.0–16.0)

## 2021-03-22 MED ORDER — POTASSIUM CHLORIDE CRYS ER 20 MEQ PO TBCR
40.0000 meq | EXTENDED_RELEASE_TABLET | Freq: Once | ORAL | Status: AC
  Administered 2021-03-22: 40 meq via ORAL
  Filled 2021-03-22: qty 2

## 2021-03-22 NOTE — Progress Notes (Signed)
? ? ?Advanced Heart Failure Rounding Note ? ? ?Subjective:   ? ?Admitted with recurrent HF  ? ?Remains on milrinone 0.125.  IV lasix stopped yesterday with low CVP. Weight stable  ? ?Feels weak. Denies CP, SOB, orthopnea or PND Scr improved 1.4 -> 1.2 ? ?Echo EF 20% RV ok. Mild AI/MR ? ? ?Objective:   ?Weight Range: ? ?Vital Signs:   ?Temp:  [97.6 ?F (36.4 ?C)-98.7 ?F (37.1 ?C)] 97.8 ?F (36.6 ?C) (03/19 3976) ?Pulse Rate:  [86-99] 86 (03/19 0731) ?Resp:  [21-30] 30 (03/19 0731) ?BP: (108-120)/(73-83) 111/83 (03/19 0731) ?SpO2:  [94 %-97 %] 94 % (03/19 0731) ?Last BM Date : 03/21/21 ? ?Weight change: ?Filed Weights  ? 03/20/21 1202 03/20/21 2241 03/21/21 0316  ?Weight: 103.4 kg 101.4 kg 101.5 kg  ? ? ?Intake/Output:  ? ?Intake/Output Summary (Last 24 hours) at 03/22/2021 1113 ?Last data filed at 03/22/2021 0800 ?Gross per 24 hour  ?Intake 722.95 ml  ?Output 975 ml  ?Net -252.05 ml  ? ?  ? ?Physical Exam: ?General:  Sitting up in bed  No resp difficulty ?HEENT: normal ?Neck: supple. no JVD. Carotids 2+ bilat; no bruits. No lymphadenopathy or thryomegaly appreciated. ?Cor: PMI nondisplaced. Regular rate & rhythm. No rubs, gallops or murmurs. ?Lungs: clear ?Abdomen: obese soft, nontender, nondistended. No hepatosplenomegaly. No bruits or masses. Good bowel sounds. ?Extremities: no cyanosis, clubbing, rash, edema ?Neuro: alert & orientedx3, cranial nerves grossly intact. moves all 4 extremities w/o difficulty. Affect pleasant ? ? ?Telemetry: Sinus 80-90s about 5-6 PVC/min Personally reviewed ? ?Labs: ?Basic Metabolic Panel: ?Recent Labs  ?Lab 03/20/21 ?1202 03/21/21 ?0318 03/22/21 ?0433  ?NA 140 142 138  ?K 3.9 3.9 3.6  ?CL 106 108 107  ?CO2 20* 21* 23  ?GLUCOSE 139* 90 110*  ?BUN '18 19 16  '$ ?CREATININE 1.61* 1.46* 1.27*  ?CALCIUM 10.0 9.8 9.1  ? ? ? ?Liver Function Tests: ?No results for input(s): AST, ALT, ALKPHOS, BILITOT, PROT, ALBUMIN in the last 168 hours. ?No results for input(s): LIPASE, AMYLASE in the last 168  hours. ?No results for input(s): AMMONIA in the last 168 hours. ? ?CBC: ?Recent Labs  ?Lab 03/20/21 ?1202  ?WBC 5.0  ?HGB 12.8*  ?HCT 36.4*  ?MCV 80.5  ?PLT 125*  ? ? ? ?Cardiac Enzymes: ?No results for input(s): CKTOTAL, CKMB, CKMBINDEX, TROPONINI in the last 168 hours. ? ?BNP: ?BNP (last 3 results) ?Recent Labs  ?  02/14/21 ?7341 03/20/21 ?1202  ?BNP 707.0* 3,221.1*  ? ? ? ?ProBNP (last 3 results) ?No results for input(s): PROBNP in the last 8760 hours. ? ? ? ?Other results: ? ?Imaging: ?DG Chest 1 View ? ?Result Date: 03/20/2021 ?CLINICAL DATA:  Status post PICC.  Central line placement. EXAM: CHEST  1 VIEW COMPARISON:  AP chest earlier same day FINDINGS: New right upper extremity PICC tip overlies the central superior vena cava. Cardiac silhouette is again moderately markedly enlarged. Mediastinal contours are grossly within normal limits. Moderately decreased lung volumes with bibasilar horizontal linear subsegmental atelectasis. No pleural effusion or pneumothorax. No acute skeletal abnormality. IMPRESSION: New right upper extremity PICC tip overlies the central superior vena cava. No significant change in cardiomegaly and bibasilar subsegmental atelectasis. Electronically Signed   By: Yvonne Kendall M.D.   On: 03/20/2021 21:41  ? ?DG Chest 1 View ? ?Result Date: 03/20/2021 ?CLINICAL DATA:  Shortness of breath EXAM: CHEST  1 VIEW COMPARISON:  02/14/2021 FINDINGS: Transverse diameter of heart is increased. There is interval decrease in pulmonary vascular  congestion. There are linear densities in both lower lung fields suggesting subsegmental atelectasis. There is no significant pleural effusion or pneumothorax. IMPRESSION: Cardiomegaly. There is interval clearing of pulmonary vascular congestion. Small linear densities in the lower lung fields suggest subsegmental atelectasis. Electronically Signed   By: Elmer Picker M.D.   On: 03/20/2021 12:24  ? ?CT Angio Chest PE W and/or Wo Contrast ? ?Result Date:  03/20/2021 ?CLINICAL DATA:  Shortness of breath since Monday. EXAM: CT ANGIOGRAPHY CHEST WITH CONTRAST TECHNIQUE: Multidetector CT imaging of the chest was performed using the standard protocol during bolus administration of intravenous contrast. Multiplanar CT image reconstructions and MIPs were obtained to evaluate the vascular anatomy. RADIATION DOSE REDUCTION: This exam was performed according to the departmental dose-optimization program which includes automated exposure control, adjustment of the mA and/or kV according to patient size and/or use of iterative reconstruction technique. CONTRAST:  13m OMNIPAQUE IOHEXOL 350 MG/ML SOLN COMPARISON:  Chest x-ray from same day. FINDINGS: Cardiovascular: Satisfactory opacification of the pulmonary arteries to the segmental level. No evidence of pulmonary embolism. Dilated main pulmonary artery measuring 3.8 cm in diameter. Cardiomegaly with severe left ventricular dilatation. No pericardial effusion. No thoracic aortic aneurysm. Reflux of contrast into the IVC. Prominent pulmonary vasculature. Mediastinum/Nodes: No enlarged mediastinal, hilar, or axillary lymph nodes. Thyroid gland, trachea, and esophagus demonstrate no significant findings. Lungs/Pleura: Few small ground-glass densities in the left upper lobe (series 6, images 30 and 54) and right upper lobe (series 6, image 42). Mild bibasilar subsegmental atelectasis. No focal consolidation, pleural effusion, or pneumothorax. Upper Abdomen: No acute abnormality. Renal asymmetry with enlarged right kidney compared to the left. Musculoskeletal: No chest wall abnormality. No acute or significant osseous findings. Review of the MIP images confirms the above findings. IMPRESSION: 1. No evidence of pulmonary embolism. 2. Few small ground-glass densities in the left upper lobe and right upper lobe, nonspecific, but favored residual pulmonary edema, less likely infectious or inflammatory. 3. Dilated main pulmonary artery,  suggestive of pulmonary arterial hypertension. Evidence of right heart dysfunction. 4. Renal asymmetry with enlarged right kidney compared to the left. An underlying right renal mass is not excluded. Recommend further evaluation with renal ultrasound or contrast-enhanced CT of the abdomen. Electronically Signed   By: WTitus DubinM.D.   On: 03/20/2021 17:36  ? ?[place]Renal ? ?Result Date: 03/20/2021 ?CLINICAL DATA:  Renal asymmetry on CT chest from same day. EXAM: RENAL / URINARY TRACT ULTRASOUND COMPLETE COMPARISON:  CT chest from same day. FINDINGS: Right Kidney: Renal measurements: 14.0 x 7.3 x 6.5 cm = volume: 347 mL. There is an 8.3 x 7.0 x 7.6 cm solid mass centered within the midpole. There are two simple cysts arising from the lower pole measuring up to 7.7 cm. Left Kidney: Renal measurements: 13.0 x 6.7 x 5.9 cm = volume: 267 mL. Echogenicity within normal limits. No mass or hydronephrosis visualized. There are two simple cysts measuring up to 3.1 cm. Bladder: Appears normal for degree of bladder distention. Other: None. IMPRESSION: 1. Large 8.3 cm solid mass centered within the right kidney, highly concerning for renal cell carcinoma. Further characterization with non-emergent renal protocol CT or MRI of the abdomen with and without contrast is recommended. Electronically Signed   By: WTitus DubinM.D.   On: 03/20/2021 19:52  ? ?ECHOCARDIOGRAM COMPLETE ? ?Result Date: 03/21/2021 ?   ECHOCARDIOGRAM REPORT   Patient Name:   Bryce BEAUCHAINEDate of Exam: 03/21/2021 Medical Rec #:  [id number]  Height:       70.0 in Accession #:    [id number]   Weight:       223.8 lb Date of Birth:  Dec 07, 1950    BSA:          2.189 m? Patient Age:    71 years     BP:           99/66 mmHg Patient Gender: M            HR:           88 bpm. Exam Location:  Inpatient Procedure: 2D Echo, Cardiac Doppler and Color Doppler Indications:    I50.21 CHF  History:        Patient has prior history of Echocardiogram examinations, most                  recent 02/15/2021. CHF, Arrythmias:LBBB; Risk Factors:Diabetes.  Sonographer:    Alvino Chapel RCS Referring Phys: [id number] Yesika Rispoli R Allison Deshotels IMPRESSIONS  1. Cannot rule out LV thrombus. Left ventricular

## 2021-03-22 NOTE — Plan of Care (Signed)

## 2021-03-22 NOTE — Plan of Care (Signed)
  Problem: Education: Goal: Knowledge of General Education information will improve Description: Including pain rating scale, medication(s)/side effects and non-pharmacologic comfort measures Outcome: Progressing   Problem: Health Behavior/Discharge Planning: Goal: Ability to manage health-related needs will improve Outcome: Progressing   Problem: Clinical Measurements: Goal: Ability to maintain clinical measurements within normal limits will improve Outcome: Progressing Goal: Will remain free from infection Outcome: Progressing Goal: Respiratory complications will improve Outcome: Progressing Goal: Cardiovascular complication will be avoided Outcome: Progressing   Problem: Activity: Goal: Risk for activity intolerance will decrease Outcome: Progressing   Problem: Nutrition: Goal: Adequate nutrition will be maintained Outcome: Progressing   Problem: Coping: Goal: Level of anxiety will decrease Outcome: Progressing   

## 2021-03-23 DIAGNOSIS — I5022 Chronic systolic (congestive) heart failure: Secondary | ICD-10-CM

## 2021-03-23 LAB — BASIC METABOLIC PANEL
Anion gap: 7 (ref 5–15)
BUN: 16 mg/dL (ref 8–23)
CO2: 25 mmol/L (ref 22–32)
Calcium: 9.4 mg/dL (ref 8.9–10.3)
Chloride: 104 mmol/L (ref 98–111)
Creatinine, Ser: 1.22 mg/dL (ref 0.61–1.24)
GFR, Estimated: >60 mL/min (ref >60)
Glucose, Bld: 92 mg/dL (ref 70–99)
Potassium: 3.9 mmol/L (ref 3.5–5.1)
Sodium: 136 mmol/L (ref 135–145)

## 2021-03-23 LAB — COOXEMETRY PANEL
Carboxyhemoglobin: 1.7 % — ABNORMAL HIGH (ref 0.5–1.5)
Methemoglobin: <0.7 % (ref 0.0–1.5)
O2 Saturation: 73.1 %
Total hemoglobin: 10.1 g/dL — ABNORMAL LOW (ref 12.0–16.0)

## 2021-03-23 MED ORDER — FUROSEMIDE 10 MG/ML IJ SOLN
80.0000 mg | Freq: Once | INTRAMUSCULAR | Status: AC
  Administered 2021-03-23: 80 mg via INTRAVENOUS
  Filled 2021-03-23: qty 8

## 2021-03-23 MED ORDER — DIGOXIN 125 MCG PO TABS
0.1250 mg | ORAL_TABLET | Freq: Every day | ORAL | Status: DC
  Administered 2021-03-23 – 2021-03-24 (×2): 0.125 mg via ORAL
  Filled 2021-03-23 (×2): qty 1

## 2021-03-23 MED ORDER — SPIRONOLACTONE 25 MG PO TABS
25.0000 mg | ORAL_TABLET | Freq: Every day | ORAL | Status: DC
  Administered 2021-03-23 – 2021-03-24 (×2): 25 mg via ORAL
  Filled 2021-03-23 (×2): qty 1

## 2021-03-23 MED ORDER — POTASSIUM CHLORIDE CRYS ER 20 MEQ PO TBCR
40.0000 meq | EXTENDED_RELEASE_TABLET | Freq: Once | ORAL | Status: AC
Start: 2021-03-23 — End: 2021-03-23
  Administered 2021-03-23: 40 meq via ORAL
  Filled 2021-03-23: qty 2

## 2021-03-23 MED ORDER — POTASSIUM CHLORIDE CRYS ER 20 MEQ PO TBCR
20.0000 meq | EXTENDED_RELEASE_TABLET | Freq: Once | ORAL | Status: DC
  Filled 2021-03-23: qty 1

## 2021-03-23 NOTE — TOC Initial Note (Signed)
Transition of Care (TOC) - Initial/Assessment Note  ? ? ?Patient Details  ?Name: Bryce Cain ?MRN: [id number] ?Date of Birth: 1950-08-22 ? ?Transition of Care [facility]) CM/SW Contact:    ?Marcheta Grammes Rexene Alberts, RN ?Phone Number: [phone] ?03/23/2021, 12:33 PM ? ?Clinical Narrative:                 ? ?HF TOC CM spoke to pt at bedside. States he drives to his appts. Scheduled PCP hospital f/u appt for 4/4 at 1120 am at [facility] and will have new pt appt with [place] at [facility] on 5/22 at 10 am. Pt agreeable to plan.  ? ? ?Expected Discharge Plan: Home/Self Care ?Barriers to Discharge: Continued Medical Work up ? ? ?Patient Goals and CMS Choice ?  ?CMS Medicare.gov Compare Post Acute Care list provided to:: Patient ?  ? ?Expected Discharge Plan and Services ?Expected Discharge Plan: Home/Self Care ?  ?Discharge Planning Services: CM Consult ?  ?Living arrangements for the past 2 months: [place] ? ?Prior Living Arrangements/Services ?Living arrangements for the past 2 months: [place] ?Lives with:: Spouse ?Patient language and need for interpreter reviewed:: Yes ?Do you feel safe going back to the place where you live?: Yes      ?Need for Family Participation in Patient Care: No (Comment) ?Care giver support system in place?: No (comment) ?  ?Criminal Activity/Legal Involvement Pertinent to Current Situation/Hospitalization: No - Comment as needed ? ?Activities of Daily Living ?Home Assistive Devices/Equipment: None ?ADL Screening (condition at time of admission) ?Patient's cognitive ability adequate to safely complete daily activities?: Yes ?Is the patient deaf or have difficulty hearing?: No ?Does the patient have difficulty seeing, even when wearing glasses/contacts?: No ?Does the patient have difficulty concentrating, remembering, or making decisions?: No ?Patient able to express need for assistance with ADLs?: Yes ?Does the patient have difficulty dressing or bathing?:  No ?Independently performs ADLs?: Yes (appropriate for developmental age) ?Does the patient have difficulty walking or climbing stairs?: No ?Weakness of Legs: None ?Weakness of Arms/Hands: None ? ?Permission Sought/Granted ?Permission sought to share information with : Case Manager, Family Supports, PCP ?Permission granted to share information with : Yes, Verbal Permission Granted ? Share Information with NAME: Bryce Cain ?   ? Permission granted to share info w Relationship: wife ? Permission granted to share info w Contact Information: [phone] ? ?Emotional Assessment ?Appearance:: Appears stated age ?Attitude/Demeanor/Rapport: Engaged ?Affect (typically observed): Accepting ?Orientation: : Oriented to Self, Oriented to Place, Oriented to  Time, Oriented to Situation ?  ?Psych Involvement: No (comment) ? ?Admission diagnosis:  CHF (congestive heart failure) ([place]) [I50.9] ?SOB (shortness of breath) [R06.02] ?Acute on chronic congestive heart failure, unspecified heart failure type ([place]) [I50.9] ?Patient Active Problem List  ? Diagnosis Date Noted  ? CHF (congestive heart failure) ([place]) 03/20/2021  ? LBBB (left bundle branch block) 03/20/2021  ? AKI (acute kidney injury) ([place]) 03/20/2021  ? Chronic systolic heart failure ([place]) 03/02/2021  ? Acute on chronic congestive heart failure ([place]) 02/14/2021  ? ?PCP:  [facility] [place] ?Pharmacy:   ?[facility] [place], [address] ?[place] ?[place] [address] ?Phone: [phone] Fax: [phone] ? ?[facility] ?[address] ?[place] [address] ?Phone: [phone] Fax: [phone] ? ? ? ? ?Social Determinants of Health (SDOH) Interventions ?  ? ?Readmission Risk Interventions ?No flowsheet data found. ? ? ?

## 2021-03-23 NOTE — Discharge Summary (Addendum)
Advanced Heart Failure Team ? ?Discharge Summary  ? ?Patient ID: Bryce Cain ?MRN: [id number], DOB/AGE: 1950/05/14 71 y.o. Admit date: 03/20/2021 ?D/C date:     03/24/2021  ? ?Primary Discharge Diagnoses:  ?Acute on chronic systolic CHF with low output ?Large Left Renal Mass concerning for RCC (new)  ?Elevated troponin ?AKI on CKD IIIa ?LBBB ?PVCs/NSVT ?CAD ?DM II ?OSA ?Microcytic anemia ?Thrombocytopenia ? ? ?Hospital Course:  ? ?Bryce Cain is a 71 y.o. retired Financial controller from [employer] with DM2, HTN and OSA admitted in 2/23 with acute systolic CHF (new).  ?  ?Echo with EF 20-25% with anterior/apical WMA suggestive of previous anterior MI. Moderate MR. RV normal. R/LHC with minimal nonobstructive CAD with separate left coronary ostia, elevated filling pressures and normal CO.  ?  ?RA =  10 ?RV = 47/17 ?PA = 52/22 (33) ?PCW = 25 ?Fick cardiac output/index = 5.5/2.5 ?PVR =1.5 WU ?Ao sat = 98% ?PA sat = 65%, 66% ?  ?cMRI demonstrated LVEF 16%, RVEF 52%, LGE basal septal midwall (scar pattern seen with NICM). Cardiomyopathy felt to be likely due to LBBB. EP consulted. Recommended optimal medical therapy for 2-3 months. If EF remained persistently reduced, plan for CRT-D.  ? ?Readmitted 03/17 with a/c CHF. HS trop 674. CT with no evidence of PE.  BNP 707 -> 3,221.  Echo 03/21/21 with EF 20%, RV okay, mild AI/MR. Started on milrinone 0.25 and diuresed with IV lasix. PICC line placed for CVP and co-ox monitoring. Weaned off milrinone 03/19. GDMT titrated. EP evaluated for consideration of CRT-D, d/t recurrent admission for decompensated HF, recommended optimizing medical therapy and f/u as outpatient to discuss device.  ? ?Also of note, during hospitalization renal ultrasound was obtained and showed a large rt renal solid mass concerning for renal cell carcinoma. This was followed by a renal protocol CT for further assessment. Study showed large heterogeneous enhancing lesion of the mid region and lower pole of the  right kidney measuring up to 10.2 cm, compatible with renal cell carcinoma. No evidence of renal vein invasion or metastatic disease in the abdomen. He will be referred to urology as an outpatient. Urgent referral placed. Consultation made w/ [facility] for 3/27.  ? ?On 3/21, pt was felt stable from cardiac standpoint and cleared by Dr. Aundra Dubin for discharge. 1 week post hospital f/u in the [facility] has been arranged. Will need BMP and digoxin level checked.  ? ? ?Hospital Course by Problem: ? ?1. Acute on chronic systolic HF ?- Echo 5/02 with EF 20-25% Moderate MR. ?- R/LHC: Minimal non-obstructive CAD with separate left coronary ostia, severe NICM elevated filling pressures with normal cardiac output.  ?- cMRI: LVEF 77%, RV systolic function  41%, basal septal midwall late gadolinium enhancement (scar pattern seen in NICM and associated with worse prognosis), RV insertion LGE  ?- felt to have LBBB CM. (PVC burden not felt to be high enough to cause CM) Seen by EP 2/23  -> trial of GDMT if no response would need CRT-D ?- Now with recurrent low-output HF symptoms. Initial co-ox 72% ?- Echo 03/21/21  EF 20% RV ok. Mild AI/MR ?- co-ox 64%. Off milrinone.  ?- CVP 4. Volume status stable. Tomorrow start torsemide 20 mg daily ?- Continue jardiance 10 mg daily  ?- Continue spiro to 25 mg daily ?- Continue Entresto 24/26 mg BID ?-Continue  digoxin 0.125 mg daily ?- Renal function stable.  ?- Given that he was re-admitted with low output HF in setting  of LBBB CM and good GDMT. EP consulted plan to follow in the community.   ?  ?2. Elevated troponin ?- suspect due to HF ?- minimal CAD on cath 2/23 ?  ?3. AKI on CKD 3a suspect cardiorenal ?- Scr 1.4-1.5 at baseline 1.61 on admit ?- Stable SCr 1.3 ?  ?4. LBBB  ?- EP saw 2/23 - recommend med optimization and repeat echo. CRT if EF remains < or = 35% after 3 months ?- as above, will reconnect with EP again given ongoing deterioration  ?- EP appreciated. Plan to follow in the  community.  ?  ?5. PVCs/NSVT ?- follow on tele ?- keep K > 4.0 Mg > 2.0 ?  ?6. Minimal non-obstructive CAD ?- On ASA/statin ?- LDL 74 ?  ?7. DM2 ?-  A1c 5.4% ?- continue jardiance 10 mg daily  ?- cover SSI ?  ?8. OSA ?- awaiting new CPAP machine ?  ?9. Microcytic anemia ?- received feraheme 02/18/21 ?- Hgb 12.8 on admit ?- consider outpatient GI w/u ?  ?10. Thrombocytopenia, chronic ?- platelets dropped 125k, were 142k on 02/14 ?- Plt back to 142k at d/c  ? ?11. New Left Renal Mass ?- noted on Renal US ?- CT Renal/Abdomen showed large 10.2 cm mass compatible w/ RCC. No evidence of renal vein invasion or ?metastatic disease in the abdomen. Pt notified of results  ?- Urgent outpatient referral to Urology placed  ?- Consultation made w/ [facility] for 3/27.  ? ?Discharge Weight Range: 317 lb  ?Discharge Vitals: Blood pressure 116/80, pulse 84, temperature (!) 97.5 ?F (36.4 ?C), temperature source Oral, resp. rate 16, height '5\' 10"'$  (1.778 m), weight 98.6 kg, SpO2 95 %. ? ?Labs: ?Lab Results  ?Component Value Date  ? WBC 5.2 03/24/2021  ? HGB 12.1 (L) 03/24/2021  ? HCT 33.3 (L) 03/24/2021  ? MCV 78.7 (L) 03/24/2021  ? PLT 142 (L) 03/24/2021  ?  ?Recent Labs  ?Lab 03/24/21 ?0540  ?NA 138  ?K 3.6  ?CL 105  ?CO2 24  ?BUN 16  ?CREATININE 1.31*  ?CALCIUM 9.5  ?GLUCOSE 99  ? ?Lab Results  ?Component Value Date  ? CHOL 137 02/15/2021  ? HDL 55 02/15/2021  ? [place] 74 02/15/2021  ? TRIG 41 02/15/2021  ? ?BNP (last 3 results) ?Recent Labs  ?  02/14/21 ?2778 March 26, 2021 ?1202  ?BNP 707.0* 3,221.1*  ? ? ?ProBNP (last 3 results) ?No results for input(s): PROBNP in the last 8760 hours. ? ? ?Diagnostic Studies/Procedures  ? ?CTA chest 03/26/21: ?IMPRESSION: ?1. No evidence of pulmonary embolism. ?2. Few small ground-glass densities in the left upper lobe and right upper lobe, nonspecific, but favored residual pulmonary edema, less likely infectious or inflammatory. ?3. Dilated main pulmonary artery, suggestive of pulmonary  arterial hypertension. Evidence of right heart dysfunction. ?4. Renal asymmetry with enlarged right kidney compared to the left. An underlying right renal mass is not excluded. Recommend further evaluation with renal ultrasound or contrast-enhanced CT of the ?abdomen. ? ? ?CT Renal/Abdomen 03/24/21 ?IMPRESSION: ?1. Large heterogeneous enhancing lesion of the mid region and lower ?pole of the right kidney measuring up to 10.2 cm, compatible with ?renal cell carcinoma. No evidence of renal vein invasion or ?metastatic disease in the abdomen. ?2.  Aortic Atherosclerosis (ICD10-I70.0). ?  ? ?Discharge Medications  ? ?Allergies as of 03/24/2021   ?No Known Allergies ?  ? ?  ?Medication List  ?  ? ?STOP taking these medications   ? ?furosemide 40 MG tablet ?  Commonly known as: Lasix ?  ?ibuprofen 200 MG tablet ?Commonly known as: ADVIL ?  ? ?  ? ?TAKE these medications   ? ?acidophilus Caps capsule ?Take 1 capsule by mouth daily. ?  ?aspirin EC 81 MG tablet ?Take 81 mg by mouth daily. Swallow whole. ?  ?digoxin 0.125 MG tablet ?Commonly known as: LANOXIN ?Take 1 tablet (0.125 mg total) by mouth daily. ?Start taking on: March 25, 2021 ?  ?docusate sodium 100 MG capsule ?Commonly known as: COLACE ?Take 100 mg by mouth 2 (two) times daily. ?  ?empagliflozin 10 MG Tabs tablet ?Commonly known as: JARDIANCE ?Take 1 tablet (10 mg total) by mouth daily. ?  ?Fish Oil 1000 MG Caps ?Take 1,000 mg by mouth daily. ?  ?multivitamin with minerals tablet ?Take 1 tablet by mouth daily. ?  ?potassium chloride 10 MEQ tablet ?Commonly known as: KLOR-CON M ?Take 1 tablet (10 mEq total) by mouth daily. ?Start taking on: March 25, 2021 ?What changed:  ?medication strength ?how much to take ?how to take this ?when to take this ?additional instructions ?  ?PreviDent 5000 Enamel Protect 1.1-5 % Gel ?Generic drug: Sod Fluoride-Potassium Nitrate ?Place 1 application. onto teeth at bedtime. ?  ?rosuvastatin 40 MG tablet ?Commonly known as: CRESTOR ?Take  1 tablet (40 mg total) by mouth daily. ?  ?sacubitril-valsartan 24-26 MG ?Commonly known as: ENTRESTO ?Take 1 tablet by mouth 2 (two) times daily. ?  ?spironolactone 25 MG tablet ?Commonly known as: ALDACTONE ?Ta

## 2021-03-23 NOTE — Care Management Important Message (Signed)
Important Message ? ?Patient Details  ?Name: Bryce Cain ?MRN: [id number] ?Date of Birth: [birth year] ? ? ?Medicare Important Message Given:  Yes ? ? ? ? ?Maureen Duesing ?03/23/2021, 3:26 PM ?

## 2021-03-23 NOTE — Progress Notes (Signed)
Heart Failure Navigator Progress Note ? ?Assessed for Heart & Vascular TOC clinic readiness.  ?Patient does not meet criteria due to being followed by HF rounding team. .  ? ?Navigator available for reassessment of patient.  ? ?Earnestine Leys, BSN, RN ?Heart Failure Nurse Navigator ?[phone]   ?

## 2021-03-23 NOTE — Plan of Care (Signed)

## 2021-03-23 NOTE — Progress Notes (Signed)
CARDIAC REHAB PHASE I  ? ?PRE:  Rate/Rhythm: 80 SR LBBB ? ?  BP: sitting  ? ?  SaO2: 97 RA ? ?MODE:  Ambulation: 170 ft  ? ?POST:  Rate/Rhythm: 106 ST LBBB ? ?  BP: sitting 115/81  ? ?  SaO2: 97 RA ? ?Pt initially reluctant to walk then agreed. Steady, no c/o. HR stable with occ PVC. Short distance. Discussed/reviewed managing HF including daily wts, low sodium diet, and walking as tolerated. Made pt copies for weight chart. He asked appropriate questions. Gave him extra low sodium sheets. He declined the walking sheet so not to have too many papers. Could reiterate instructions. ?[id number]  ? ?[place], ACSM ?03/23/2021 ?2:36 PM ? ? ? ? ?

## 2021-03-23 NOTE — Consult Note (Signed)
? ?ELECTROPHYSIOLOGY CONSULT NOTE  ? ? ?Patient ID: Bryce Cain ?MRN: [id number], DOB/AGE: 1950/03/07 71 y.o. ? ?Admit date: 03/20/2021 ?Date of Consult: 03/23/2021 ? ?Primary Physician: [facility] ?Primary Cardiologist: None  ?Electrophysiologist: Dr. Quentin Ore ? ?Referring Provider: Dr. Haroldine Laws  ? ?Patient Profile: ?Bryce Cain is a 71 y.o. male with a history of DM2, HTN, OSA, and NICM who is being seen today for the evaluation of CRT consideration at the request of Dr. Haroldine Laws. ? ?Previously admitted in 2/23 with acute systolic CHF (new).  ?  ? Echo with EF 20-25% with anterior/apical WMA suggestive of previous anterior MI. Moderate MR. RV normal. R/LHC with minimal nonobstructive CAD with separate left coronary ostia, elevated filling pressures and normal CO.  ?  ?RA =  10 ?RV = 47/17 ?PA = 52/22 (33) ?PCW = 25 ?Fick cardiac output/index = 5.5/2.5 ?PVR =1.5 WU ?Ao sat = 98% ?PA sat = 65%, 66% ?  ?cMRI demonstrated LVEF 16%, RVEF 52%, LGE basal septal midwall (scar pattern seen with NICM). Cardiomyopathy felt to be likely due to LBBB. EP consulted. Recommended optimal medical therapy for 2-3 months. If EF remained persistently reduced, plan for CRT-D.  ? ?HPI:  ?Bryce Cain is a 71 y.o. male with medical history as above.  ? ?Admitted 3/17 with SOB on mild exertion with poor appetite and fatigue. HStrop 674, CT with no PE. BNP 3221, Cr 1.6. Felt to have "Cold/Wet" HF physiology. Diuresed with IV milrinone for inotropic support over the weekend.  ? ?CVP 14 this am. Coox 73% off milrinone. Cr improved to 1.22. ? ?Currently, He denies chest pain, palpitations, dyspnea, PND, orthopnea, nausea, vomiting, syncope, weight gain, or early satiety. Mildly lightheaded with rapid standing. Weighing "most days" at homes. Denies having 3 lbs overnight or 5 lbs within a week.  ? ?Past Medical History:  ?Diagnosis Date  ? DM (diabetes mellitus), type 2 ([place])   ? OSA on CPAP   ?  ? ?Surgical History:  ?Past  Surgical History:  ?Procedure Laterality Date  ? RIGHT/LEFT HEART CATH AND CORONARY ANGIOGRAPHY N/A 02/16/2021  ? Procedure: RIGHT/LEFT HEART CATH AND CORONARY ANGIOGRAPHY;  Surgeon: Jolaine Artist, MD;  Location: [facility];  Service: Cardiovascular;  Laterality: N/A;  ?  ? ?Medications Prior to Admission  ?Medication Sig Dispense Refill Last Dose  ? acidophilus (RISAQUAD) CAPS capsule Take 1 capsule by mouth daily.   03/19/2021  ? aspirin EC 81 MG tablet Take 81 mg by mouth daily. Swallow whole.   03/19/2021  ? docusate sodium (COLACE) 100 MG capsule Take 100 mg by mouth 2 (two) times daily.   03/19/2021  ? empagliflozin (JARDIANCE) 10 MG TABS tablet Take 1 tablet (10 mg total) by mouth daily. 30 tablet 1 03/19/2021  ? furosemide (LASIX) 40 MG tablet Take 1 tablet twice a week (Monday and Friday) (Patient taking differently: Take 40 mg by mouth See admin instructions. Monday and friday) 15 tablet 1 03/16/2021  ? ibuprofen (ADVIL) 200 MG tablet Take 200 mg by mouth every 6 (six) hours as needed for headache or moderate pain.   Past Month  ? Multiple Vitamins-Minerals (MULTIVITAMIN WITH MINERALS) tablet Take 1 tablet by mouth daily.   03/19/2021  ? Omega-3 Fatty Acids (FISH OIL) 1000 MG CAPS Take 1,000 mg by mouth daily.   03/19/2021  ? potassium chloride SA (KLOR-CON M) 20 MEQ tablet Take 1 tablet twice a week with furosemide (Monday and Friday) (Patient taking differently: Take  20 mEq by mouth See admin instructions. Monday and friday) 15 tablet 1 03/16/2021  ? PREVIDENT 5000 ENAMEL PROTECT 1.1-5 % GEL Place 1 application. onto teeth at bedtime.   03/19/2021  ? rosuvastatin (CRESTOR) 40 MG tablet Take 1 tablet (40 mg total) by mouth daily. 30 tablet 1 03/19/2021  ? sacubitril-valsartan (ENTRESTO) 24-26 MG Take 1 tablet by mouth 2 (two) times daily. 60 tablet 1 03/19/2021  ? spironolactone (ALDACTONE) 25 MG tablet TAKE 1/2 TABLET BY MOUTH DAILY (Patient taking differently: Take 12.5 mg by mouth daily.) 45 tablet 3  03/19/2021  ? ? ?Inpatient Medications:  ? aspirin EC  81 mg Oral Daily  ? Chlorhexidine Gluconate Cloth  6 each Topical Daily  ? digoxin  0.125 mg Oral Daily  ? empagliflozin  10 mg Oral Daily  ? heparin  5,000 Units Subcutaneous Q8H  ? potassium chloride  20 mEq Oral Once  ? rosuvastatin  40 mg Oral Daily  ? sacubitril-valsartan  1 tablet Oral BID  ? sodium chloride flush  10-40 mL Intracatheter Q12H  ? sodium chloride flush  3 mL Intravenous Q12H  ? spironolactone  25 mg Oral Daily  ? ? ?Allergies: No Known Allergies ? ?Social History  ? ?Socioeconomic History  ? Marital status: Married  ?  Spouse name: Not on file  ? Number of children: Not on file  ? Years of education: Not on file  ? Highest education level: Not on file  ?Occupational History  ? Not on file  ?Tobacco Use  ? Smoking status: Former  ?  Types: Pipe  ?  Quit date: 02/05/1991  ?  Years since quitting: 30.1  ? Smokeless tobacco: Not on file  ?Substance and Sexual Activity  ? Alcohol use: Not on file  ? Drug use: Not on file  ? Sexual activity: Not on file  ?Other Topics Concern  ? Not on file  ?Social History Narrative  ? Not on file  ? ?Social Determinants of Health  ? ?Financial Resource Strain: Not on file  ?Food Insecurity: Not on file  ?Transportation Needs: Not on file  ?Physical Activity: Not on file  ?Stress: Not on file  ?Social Connections: Not on file  ?Intimate Partner Violence: Not on file  ?  ? ?Family History  ?Problem Relation Age of Onset  ? Heart attack Father   ?  ? ?Review of Systems: ?All other systems reviewed and are otherwise negative except as noted above. ? ?Physical Exam: ?Vitals:  ? 03/22/21 1123 03/22/21 2123 03/23/21 0027 03/23/21 0439  ?BP: 113/90  123/84 116/82  ?Pulse: 90 88 88 84  ?Resp: 20 19 (!) 23 (!) 25  ?Temp: 98.2 ?F (36.8 ?C) 97.9 ?F (36.6 ?C) 98.3 ?F (36.8 ?C) 97.8 ?F (36.6 ?C)  ?TempSrc: Oral Oral Oral Oral  ?SpO2: 96% 95% 96% 97%  ?Weight:      ?Height:      ? ? ?GEN- The patient is well appearing, alert and  oriented x 3 today.   ?HEENT: normocephalic, atraumatic; sclera clear, conjunctiva pink; hearing intact; oropharynx clear; neck supple ?Lungs- Clear to ausculation bilaterally, normal work of breathing.  No wheezes, rales, rhonchi ?Heart- Regular rate and rhythm, no murmurs, rubs or gallops ?GI- soft, non-tender, non-distended, bowel sounds present ?Extremities- no clubbing, cyanosis, or edema; DP/PT/radial pulses 2+ bilaterally ?MS- no significant deformity or atrophy ?Skin- warm and dry, no rash or lesion ?Psych- euthymic mood, full affect ?Neuro- strength and sensation are intact ? ?Labs: ?  ?  Lab Results  ?Component Value Date  ? WBC 5.0 03/20/2021  ? HGB 12.8 (L) 03/20/2021  ? HCT 36.4 (L) 03/20/2021  ? MCV 80.5 03/20/2021  ? PLT 125 (L) 03/20/2021  ?  ?Recent Labs  ?Lab 03/23/21 ?0413  ?NA 136  ?K 3.9  ?CL 104  ?CO2 25  ?BUN 16  ?CREATININE 1.22  ?CALCIUM 9.4  ?GLUCOSE 92  ? ? ?  ?Radiology/Studies: DG Chest 1 View ? ?Result Date: 03/20/2021 ?CLINICAL DATA:  Status post PICC.  Central line placement. EXAM: CHEST  1 VIEW COMPARISON:  AP chest earlier same day FINDINGS: New right upper extremity PICC tip overlies the central superior vena cava. Cardiac silhouette is again moderately markedly enlarged. Mediastinal contours are grossly within normal limits. Moderately decreased lung volumes with bibasilar horizontal linear subsegmental atelectasis. No pleural effusion or pneumothorax. No acute skeletal abnormality. IMPRESSION: New right upper extremity PICC tip overlies the central superior vena cava. No significant change in cardiomegaly and bibasilar subsegmental atelectasis. Electronically Signed   By: Yvonne Kendall M.D.   On: 03/20/2021 21:41  ? ?DG Chest 1 View ? ?Result Date: 03/20/2021 ?CLINICAL DATA:  Shortness of breath EXAM: CHEST  1 VIEW COMPARISON:  02/14/2021 FINDINGS: Transverse diameter of heart is increased. There is interval decrease in pulmonary vascular congestion. There are linear densities in  both lower lung Cain suggesting subsegmental atelectasis. There is no significant pleural effusion or pneumothorax. IMPRESSION: Cardiomegaly. There is interval clearing of pulmonary vascular congesti

## 2021-03-23 NOTE — Progress Notes (Addendum)
? ? ?Advanced Heart Failure Rounding Note ? ? ?Subjective:   ? ?03/17: Admitted with recurrent HF. Started on milrinone 0.25. ?03/19: Milrinone weaned off. ? ?Co-ox 73% this am off milrinone. CVP 14.  ? ?Scr stable at 1.22.  ? ?Feels okay. Denies dyspnea at rest. No orthopnea or PND. ? ? ?Echo EF 20% RV ok. Mild AI/MR ? ? ?Objective:   ? ?Vital Signs:   ?Temp:  [97.8 ?F (36.6 ?C)-98.3 ?F (36.8 ?C)] 97.8 ?F (36.6 ?C) (03/20 0439) ?Pulse Rate:  [84-90] 84 (03/20 0439) ?Resp:  [19-25] 25 (03/20 0439) ?BP: (113-123)/(82-90) 116/82 (03/20 0439) ?SpO2:  [95 %-97 %] 97 % (03/20 0439) ?Last BM Date : 03/22/21 ? ?Weight change: ?Filed Weights  ? 03/20/21 1202 03/20/21 2241 03/21/21 0316  ?Weight: 103.4 kg 101.4 kg 101.5 kg  ? ? ?Intake/Output:  ? ?Intake/Output Summary (Last 24 hours) at 03/23/2021 0808 ?Last data filed at 03/23/2021 0440 ?Gross per 24 hour  ?Intake 390.71 ml  ?Output 850 ml  ?Net -459.29 ml  ?  ? ?Physical Exam: ?General:  No distress. Sitting up in bed. ?HEENT: normal ?Neck: supple. JVP 14 cm. Carotids 2+ bilat; no bruits.  ?Cor: PMI nondisplaced. Regular rate & rhythm. No rubs, gallops or murmurs. ?Lungs: clear ?Abdomen: soft, nontender, nondistended. No hepatosplenomegaly.  ?Extremities: no cyanosis, clubbing, rash, edema ?Neuro: alert & orientedx3, cranial nerves grossly intact. moves all 4 extremities w/o difficulty. Affect pleasant ? ? ? ?Telemetry: Sinus 80s-90s, 2-4 PVCs/min (personally reviewed) ? ?Labs: ?Basic Metabolic Panel: ?Recent Labs  ?Lab 03/20/21 ?1202 03/21/21 ?0318 03/22/21 ?0433 03/23/21 ?0413  ?NA 140 142 138 136  ?K 3.9 3.9 3.6 3.9  ?CL 106 108 107 104  ?CO2 20* 21* 23 25  ?GLUCOSE 139* 90 110* 92  ?BUN '18 19 16 16  '$ ?CREATININE 1.61* 1.46* 1.27* 1.22  ?CALCIUM 10.0 9.8 9.1 9.4  ? ? ?Liver Function Tests: ?No results for input(s): AST, ALT, ALKPHOS, BILITOT, PROT, ALBUMIN in the last 168 hours. ?No results for input(s): LIPASE, AMYLASE in the last 168 hours. ?No results for input(s):  AMMONIA in the last 168 hours. ? ?CBC: ?Recent Labs  ?Lab 03/20/21 ?1202  ?WBC 5.0  ?HGB 12.8*  ?HCT 36.4*  ?MCV 80.5  ?PLT 125*  ? ? ?Cardiac Enzymes: ?No results for input(s): CKTOTAL, CKMB, CKMBINDEX, TROPONINI in the last 168 hours. ? ?BNP: ?BNP (last 3 results) ?Recent Labs  ?  02/14/21 ?2952 03/20/21 ?1202  ?BNP 707.0* 3,221.1*  ? ? ?ProBNP (last 3 results) ?No results for input(s): PROBNP in the last 8760 hours. ? ? ? ?Other results: ? ?Imaging: ?ECHOCARDIOGRAM COMPLETE ? ?Result Date: 03/21/2021 ?   ECHOCARDIOGRAM REPORT   Patient Name:   Bryce Cain Date of Exam: 03/21/2021 Medical Rec #:  [id number]    Height:       70.0 in Accession #:    [id number]   Weight:       223.8 lb Date of Birth:  [birth year]    BSA:          2.189 m? Patient Age:    71 years     BP:           99/66 mmHg Patient Gender: M            HR:           88 bpm. Exam Location:  Inpatient Procedure: 2D Echo, Cardiac Doppler and Color Doppler Indications:    I50.21 CHF  History:  Patient has prior history of Echocardiogram examinations, most                 recent 02/15/2021. CHF, Arrythmias:LBBB; Risk Factors:Diabetes.  Sonographer:    Alvino Chapel RCS Referring Phys: [id number] Chistine Dematteo R Tationa Stech IMPRESSIONS  1. Cannot rule out LV thrombus. Left ventricular ejection fraction, by estimation, is 20%. The left ventricle has severely decreased function. The left ventricle demonstrates global hypokinesis. The left ventricular internal cavity size was severely dilated. Left ventricular diastolic parameters are indeterminate.  2. Right ventricular systolic function is normal. The right ventricular size is normal. Tricuspid regurgitation signal is inadequate for assessing PA pressure.  3. Left atrial size was severely dilated.  4. Mild mitral valve regurgitation.  5. Aortic valve regurgitation is mild. Aortic valve sclerosis/calcification is present, without any evidence of aortic stenosis.  6. Pulmonic valve regurgitation is moderate.  7. 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  8. The inferior vena cava is dilated in size with >50% respiratory variability, suggesting right atrial pressure of 8 mmHg. Conclusion(s)/Recommendation(s): Compared to prior echo in 02/2021 Possible LV thrombus. Consider repeat limited study with contrast. FINDINGS  Left Ventricle: Cannot rule out LV thrombus. Left ventricular ejection fraction, by estimation, is 20%. The left ventricle has severely decreased function. The left ventricle demonstrates global hypokinesis. The left ventricular internal cavity size was  severely dilated. Left ventricular diastolic parameters are indeterminate. Right Ventricle: The right ventricular size is normal. No increase in right ventricular wall thickness. Right ventricular systolic function is normal. Tricuspid regurgitation signal is inadequate for assessing PA pressure. Left Atrium: Left atrial size was severely dilated. Right Atrium: Right atrial size was normal in size. Pericardium: There is no evidence of pericardial effusion. Mitral Valve: Mild mitral valve regurgitation. Tricuspid Valve: Tricuspid valve regurgitation is trivial. Aortic Valve: Aortic valve regurgitation is mild. Aortic valve sclerosis/calcification is present, without any evidence of aortic stenosis. Pulmonic Valve: Pulmonic valve regurgitation is moderate. Aorta: Mild root anuerysm 4.0 cm. Venous: The inferior vena cava is dilated in size with greater than 50% respiratory variability, suggesting right atrial pressure of 8 mmHg.  LEFT VENTRICLE PLAX 2D LVIDd:         7.10 cm LVIDs:         5.90 cm LV PW:         1.10 cm LV IVS:        1.80 cm LVOT diam:     2.00 cm LV SV:         43 LV SV Index:   20 LVOT Area:     3.14 cm?  RIGHT VENTRICLE RV S prime:     19.10 cm/s TAPSE (M-mode): 2.9 cm LEFT ATRIUM              Index        RIGHT ATRIUM           Index LA diam:        5.20 cm  2.38 cm/m?   RA Area:     17.00 cm? LA Vol (A2C):   127.0 ml 58.01 ml/m?  RA Volume:   49.60 ml   22.66 ml/m? LA Vol (A4C):   109.0 ml 49.79 ml/m? LA Biplane Vol: 120.0 ml 54.81 ml/m?  AORTIC VALVE LVOT Vmax:   79.00 cm/s LVOT Vmean:  50.500 cm/s LVOT VTI:    0.138 m  AORTA Ao Root diam: 4.00 cm MITRAL VALVE MV Area (PHT): 5.38 cm?    SHUNTS MV Decel Time:  141 msec    Systemic VTI:  0.14 m MR Peak grad: 77.4 mmHg    Systemic Diam: 2.00 cm MR Mean grad: 45.0 mmHg MR Vmax:      440.00 cm/s MR Vmean:     312.0 cm/s MV E velocity: 73.13 cm/s MV A velocity: 90.00 cm/s MV E/A ratio:  0.81 Mary Scientist, physiological signed by Phineas Inches Signature Date/Time: 03/21/2021/5:58:12 PM    Final    ? ? ?Medications:   ? ? ?Scheduled Medications: ? aspirin EC  81 mg Oral Daily  ? Chlorhexidine Gluconate Cloth  6 each Topical Daily  ? empagliflozin  10 mg Oral Daily  ? heparin  5,000 Units Subcutaneous Q8H  ? rosuvastatin  40 mg Oral Daily  ? sacubitril-valsartan  1 tablet Oral BID  ? sodium chloride flush  10-40 mL Intracatheter Q12H  ? sodium chloride flush  3 mL Intravenous Q12H  ? spironolactone  12.5 mg Oral Daily  ? ? ?Infusions: ? sodium chloride Stopped (03/22/21 1205)  ? ? ?PRN Medications: ?sodium chloride, acetaminophen, ondansetron (ZOFRAN) IV, sodium chloride flush, sodium chloride flush ? ? ?Assessment/Plan:  ? ?1. Acute on chronic systolic HF ?- Echo 7/20 with EF 20-25% Moderate MR. ?- R/LHC: Minimal non-obstructive CAD with separate left coronary ostia, severe NICM elevated filling pressures with normal cardiac output.  ?- cMRI: LVEF 94%, RV systolic function  70%, basal septal midwall late gadolinium enhancement (scar pattern seen in NICM and associated with worse prognosis), RV insertion LGE  ?- felt to have LBBB CM. (PVC burden not felt to be high enough to cause CM) Seen by EP 2/23  -> trial of GDMT if no response would need CRT-D ?- Now with recurrent low-output HF symptoms. Initial co-ox 72% ?- Echo 03/21/21  EF 20% RV ok. Mild AI/MR ?- co-ox 73% this am off milrinone.  ?- CVP 14. Will give 80 mg lasix IV  and start po Torsemide tomorrow. ?- Continue jardiance 10 mg daily  ?- Increase spiro to 25 mg daily ?- Continue Entresto 24/26 mg BID ?- Add digoxin 0.125 mg daily ?- Given that he was re-admitted with low out

## 2021-03-24 ENCOUNTER — Other Ambulatory Visit (HOSPITAL_COMMUNITY): Payer: Self-pay | Admitting: Cardiology

## 2021-03-24 ENCOUNTER — Other Ambulatory Visit (HOSPITAL_COMMUNITY): Payer: Self-pay

## 2021-03-24 ENCOUNTER — Inpatient Hospital Stay (HOSPITAL_COMMUNITY): Payer: Medicare PPO

## 2021-03-24 DIAGNOSIS — I5043 Acute on chronic combined systolic (congestive) and diastolic (congestive) heart failure: Secondary | ICD-10-CM

## 2021-03-24 DIAGNOSIS — N2889 Other specified disorders of kidney and ureter: Secondary | ICD-10-CM

## 2021-03-24 LAB — COOXEMETRY PANEL
Carboxyhemoglobin: 1.4 % (ref 0.5–1.5)
Methemoglobin: <0.7 % (ref 0.0–1.5)
O2 Saturation: 64 %
Total hemoglobin: 12.5 g/dL (ref 12.0–16.0)

## 2021-03-24 LAB — CBC
HCT: 33.3 % — ABNORMAL LOW (ref 39.0–52.0)
Hemoglobin: 12.1 g/dL — ABNORMAL LOW (ref 13.0–17.0)
MCH: 28.6 pg (ref 26.0–34.0)
MCHC: 36.3 g/dL — ABNORMAL HIGH (ref 30.0–36.0)
MCV: 78.7 fL — ABNORMAL LOW (ref 80.0–100.0)
Platelets: 142 10*3/uL — ABNORMAL LOW (ref 150–400)
RBC: 4.23 MIL/uL (ref 4.22–5.81)
RDW: 15.7 % — ABNORMAL HIGH (ref 11.5–15.5)
WBC: 5.2 10*3/uL (ref 4.0–10.5)
nRBC: 0 % (ref 0.0–0.2)

## 2021-03-24 LAB — BASIC METABOLIC PANEL
Anion gap: 9 (ref 5–15)
BUN: 16 mg/dL (ref 8–23)
CO2: 24 mmol/L (ref 22–32)
Calcium: 9.5 mg/dL (ref 8.9–10.3)
Chloride: 105 mmol/L (ref 98–111)
Creatinine, Ser: 1.31 mg/dL — ABNORMAL HIGH (ref 0.61–1.24)
GFR, Estimated: 58 mL/min — ABNORMAL LOW (ref >60)
Glucose, Bld: 99 mg/dL (ref 70–99)
Potassium: 3.6 mmol/L (ref 3.5–5.1)
Sodium: 138 mmol/L (ref 135–145)

## 2021-03-24 IMAGING — CT CT ABDOMEN WO/W CM
4 of 15 series · 12 of 46 positions shown, 18 images · IV contrast (agent unspecified)
Comparison: Renal ultrasound dated [DATE]

CLINICAL DATA: Indeterminate mass seen on ultrasound

EXAM:
CT ABDOMEN WITHOUT AND WITH CONTRAST
TECHNIQUE: Multidetector CT imaging of the abdomen was performed following the
standard protocol before and following the bolus administration of
intravenous contrast.

[Series 3: arterial · axial · arterial · 0.93mm/px · z∈[+1216,+1405]mm · 4 of 105 slices shown]
[im 21/105  soft-tissue]
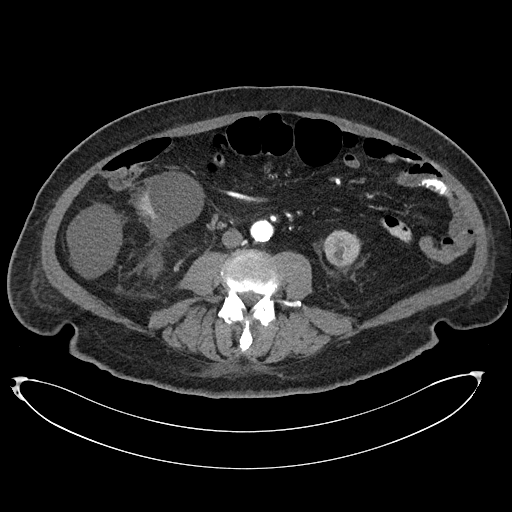
[im 42/105  soft-tissue]
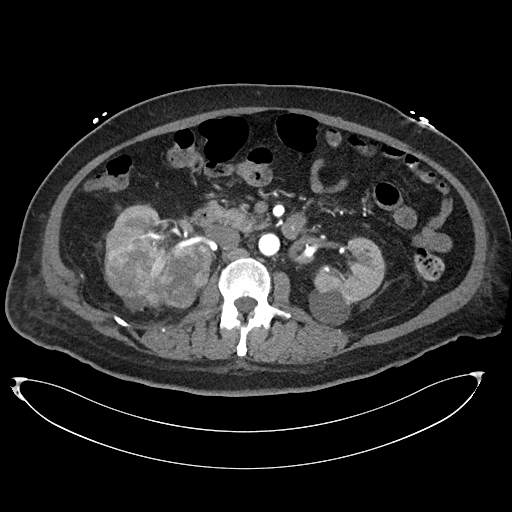
[im 63/105  soft-tissue]
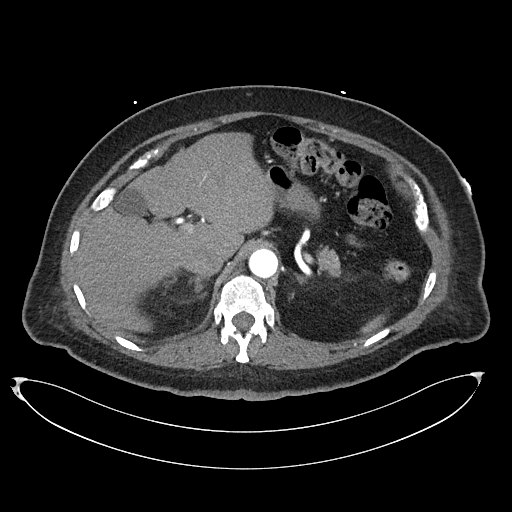
[im 84/105  soft-tissue]
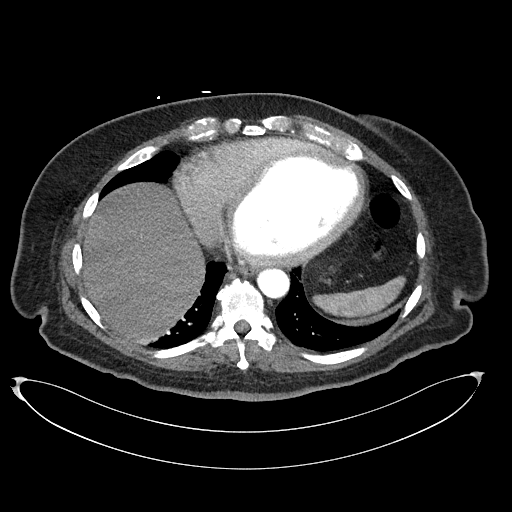

[Series 5: nephrographic · axial · 0.93mm/px · z∈[+1216,+1279]mm · 2 of 105 slices shown]
[im 21/105  soft-tissue]
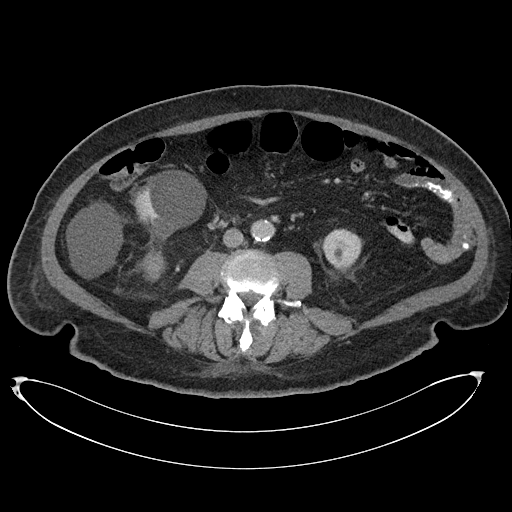
[im 42/105  soft-tissue]
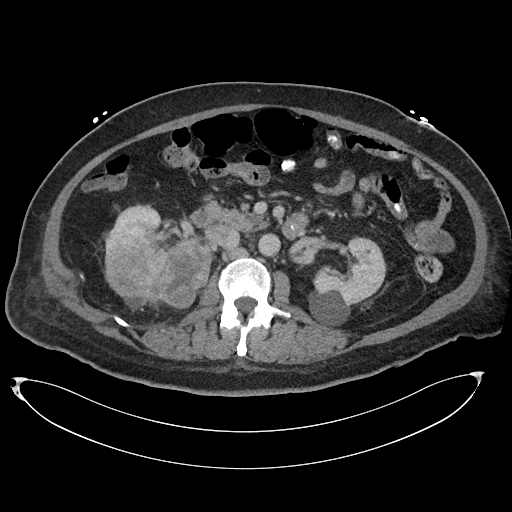

[Series 7: renal w/o 3.0 · axial · non-contrast · 0.93mm/px · z∈[+1204,+1414]mm · 5 of 106 slices shown, 10 images]
[im 18/106  soft-tissue]
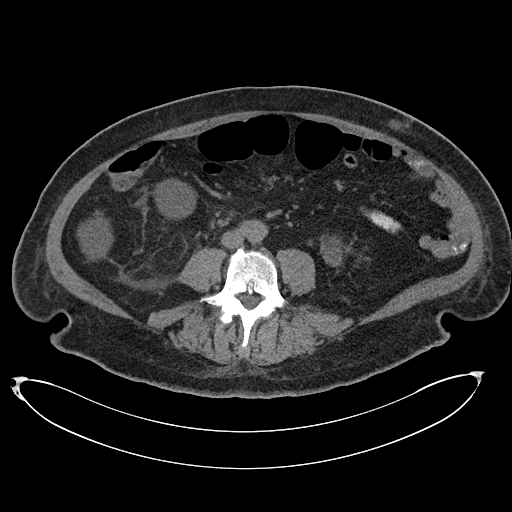
[im 18/106  bone]
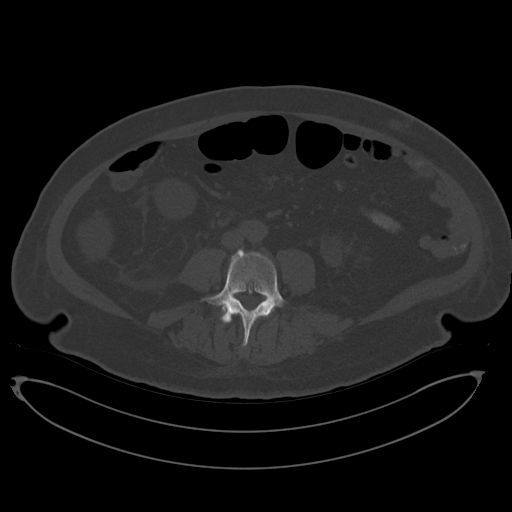
[im 36/106  soft-tissue]
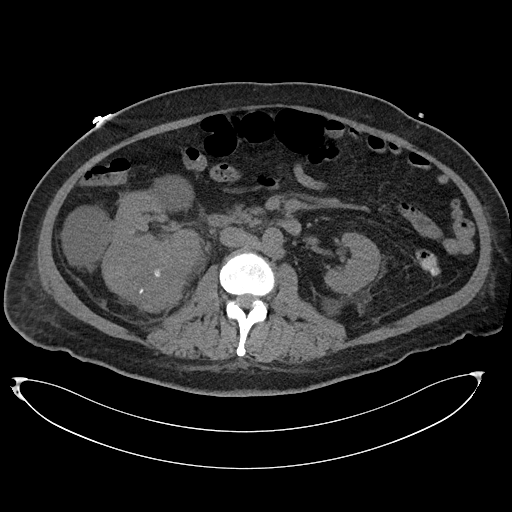
[im 36/106  lung]
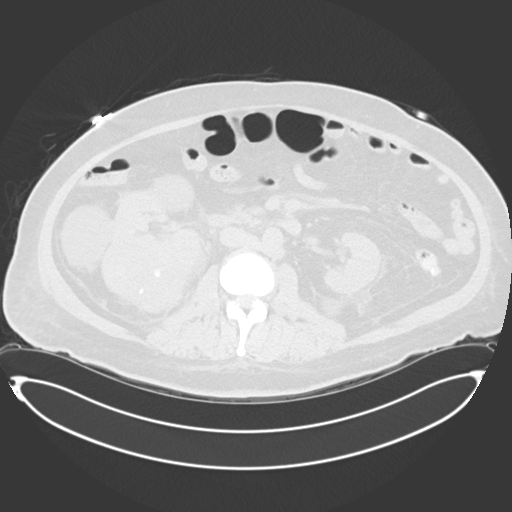
[im 53/106  soft-tissue]
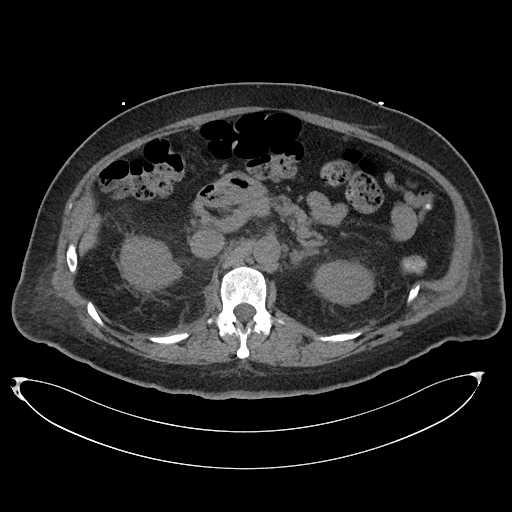
[im 53/106  lung]
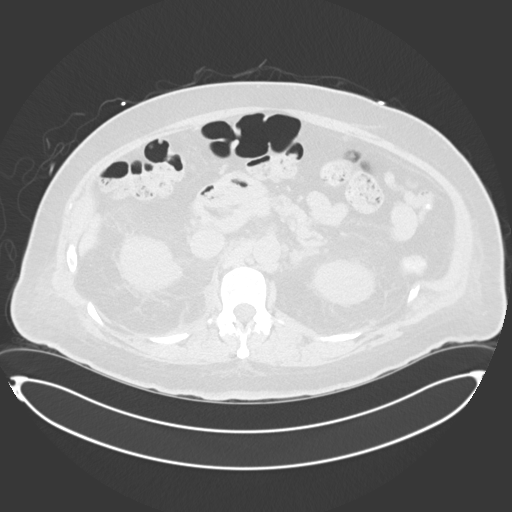
[im 71/106  soft-tissue]
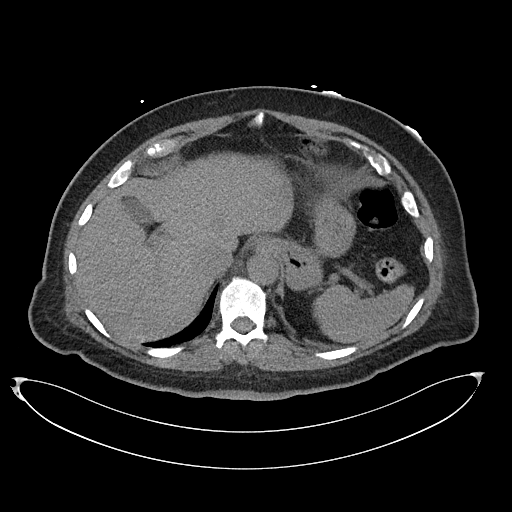
[im 71/106  lung]
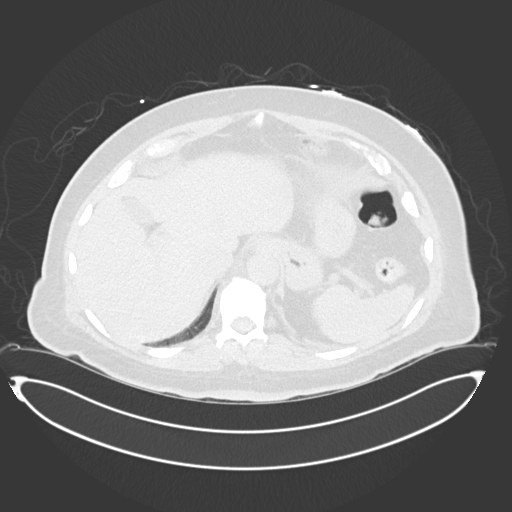
[im 88/106  soft-tissue]
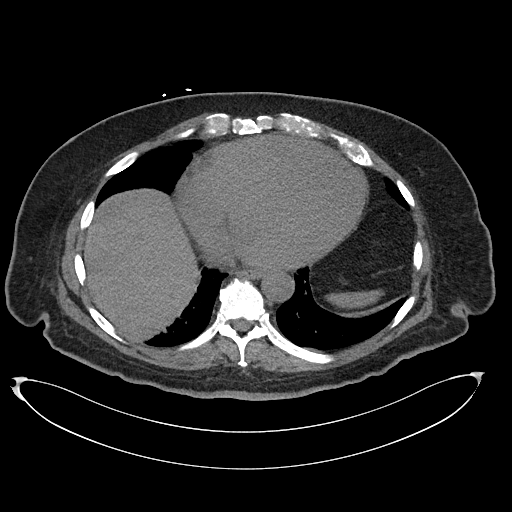
[im 88/106  lung]
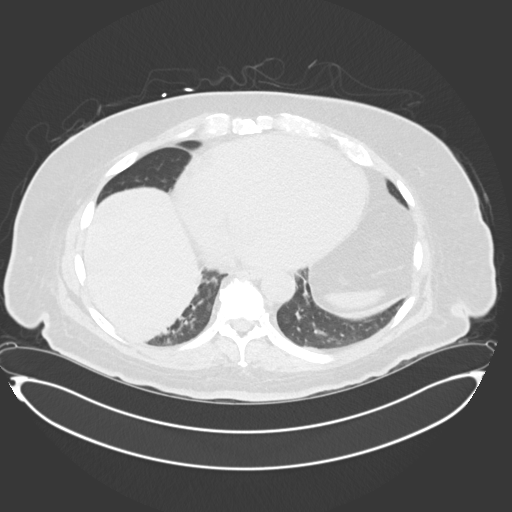

[Series 15: nephro cor · coronal · 0.63mm/px · 1 of 101 slices shown, 2 images]
[im 51/101  soft-tissue]
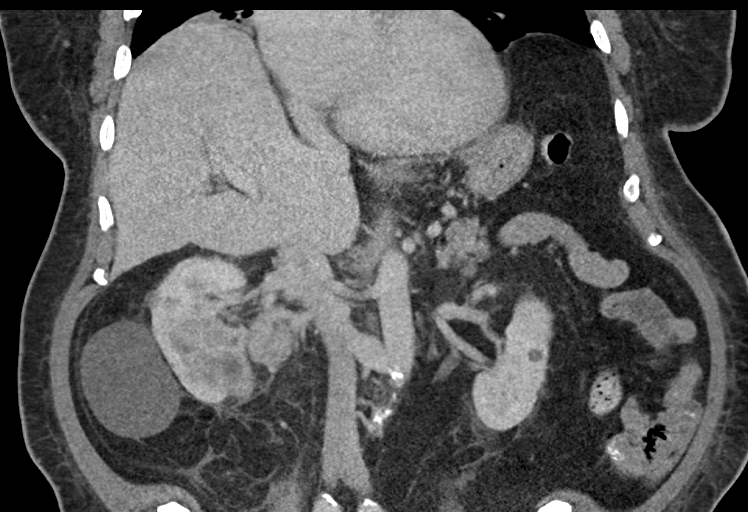
[im 51/101  bone]
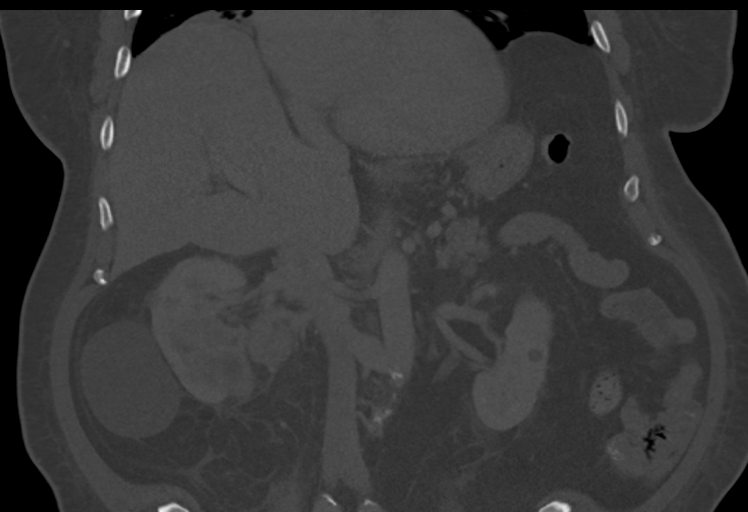

[12 of 46 positions shown; findings below may reference images not displayed]

RADIATION DOSE REDUCTION: This exam was performed according to the
departmental dose-optimization program which includes automated
exposure control, adjustment of the mA and/or kV according to
patient size and/or use of iterative reconstruction technique.

CONTRAST:  100mL OMNIPAQUE IOHEXOL 350 MG/ML SOLN
FINDINGS: Lower chest: Perihilar linear opacities, likely due to atelectasis.
Cardiomegaly. No pericardial effusion.

Hepatobiliary: No focal liver abnormality is seen. No gallstones,
gallbladder wall thickening, or biliary dilatation.

Pancreas: Unremarkable. No pancreatic ductal dilatation or
surrounding inflammatory changes.

Spleen: Normal in size without focal abnormality.

Adrenals/Urinary Tract: Bilateral adrenal glands are unremarkable.
Heterogeneous enhancing mass involving the mid region and lower pole
of the right kidney measures 10.2 x 7.0 cm on series 5, image 67. No
evidence of renal vein invasion. Asymmetric right perirenal fat
stranding. Additional bilateral low-density lesions are seen,
largest are compatible with simple cysts, others are too small to
completely characterize.

Stomach/Bowel: Stomach is unremarkable. Visualized small and large
bowel are unremarkable. Partially visualized appendix is normal.

Vascular/Lymphatic: Atherosclerosis of the abdominal aorta.

Other: No abdominal wall hernia or abnormality.

Musculoskeletal: No acute or significant osseous findings.
IMPRESSION: 1. Large heterogeneous enhancing lesion of the mid region and lower
pole of the right kidney measuring up to 10.2 cm, compatible with
renal cell carcinoma. No evidence of renal vein invasion or
metastatic disease in the abdomen.
2.  Aortic Atherosclerosis ([9F]-[9F]).

## 2021-03-24 MED ORDER — TORSEMIDE 20 MG PO TABS
20.0000 mg | ORAL_TABLET | Freq: Every day | ORAL | 5 refills | Status: DC
Start: 2021-03-25 — End: 2021-04-02
  Filled 2021-03-24: qty 30, 30d supply, fill #0

## 2021-03-24 MED ORDER — POTASSIUM CHLORIDE CRYS ER 20 MEQ PO TBCR
40.0000 meq | EXTENDED_RELEASE_TABLET | Freq: Once | ORAL | Status: AC
  Administered 2021-03-24: 40 meq via ORAL
  Filled 2021-03-24: qty 2

## 2021-03-24 MED ORDER — SPIRONOLACTONE 25 MG PO TABS
25.0000 mg | ORAL_TABLET | Freq: Every day | ORAL | 3 refills | Status: DC
  Filled 2021-03-24: qty 45, 45d supply, fill #0

## 2021-03-24 MED ORDER — IOHEXOL 350 MG/ML SOLN
100.0000 mL | Freq: Once | INTRAVENOUS | Status: AC | PRN
  Administered 2021-03-24: 100 mL via INTRAVENOUS

## 2021-03-24 MED ORDER — POTASSIUM CHLORIDE CRYS ER 10 MEQ PO TBCR
10.0000 meq | EXTENDED_RELEASE_TABLET | Freq: Every day | ORAL | 5 refills | Status: DC
  Filled 2021-03-24: qty 30, 30d supply, fill #0

## 2021-03-24 MED ORDER — DIGOXIN 125 MCG PO TABS
0.1250 mg | ORAL_TABLET | Freq: Every day | ORAL | 5 refills | Status: DC
  Filled 2021-03-24: qty 30, 30d supply, fill #0

## 2021-03-24 NOTE — Progress Notes (Addendum)
? ? ?Advanced Heart Failure Rounding Note ? ? ?Subjective:   ? ?03/17: Admitted with recurrent HF. Started on milrinone 0.25. ?03/19: Milrinone weaned off. ?03/20: Diuresed with IV lasix ? ?Negative 2.1 liters. CVP 4 ? ?CO-OX 64%. ? ?Denies SOB. Walked in the hall without difficulty  ? ? ? ?Objective:   ? ?Vital Signs:   ?Temp:  [97.9 ?F (36.6 ?C)-98.3 ?F (36.8 ?C)] 97.9 ?F (36.6 ?C) (03/21 0414) ?Pulse Rate:  [85-87] 87 (03/21 0414) ?Resp:  [11-25] 25 (03/21 0414) ?BP: (108-119)/(77-83) 117/83 (03/21 0414) ?SpO2:  [94 %-95 %] 95 % (03/21 0733) ?Last BM Date : 03/22/21 ? ?Weight change: ?Filed Weights  ? 03/20/21 1202 03/20/21 2241 03/21/21 0316  ?Weight: 103.4 kg 101.4 kg 101.5 kg  ? ? ?Intake/Output:  ? ?Intake/Output Summary (Last 24 hours) at 03/24/2021 0805 ?Last data filed at 03/24/2021 0000 ?Gross per 24 hour  ?Intake 720 ml  ?Output 3115 ml  ?Net -2395 ml  ?  ?CVP 4  ?Physical Exam: ?General:   No resp difficulty ?HEENT: normal ?Neck: supple. no JVD. Carotids 2+ bilat; no bruits. No lymphadenopathy or thryomegaly appreciated. ?Cor: PMI nondisplaced. Regular rate & rhythm. No rubs, gallops or murmurs. ?Lungs: clear ?Abdomen: soft, nontender, nondistended. No hepatosplenomegaly. No bruits or masses. Good bowel sounds. ?Extremities: no cyanosis, clubbing, rash, edema. RUE PICC  ?Neuro: alert & orientedx3, cranial nerves grossly intact. moves all 4 extremities w/o difficulty. Affect pleasant ? ?Telemetry:  SR 80s  ? ?Labs: ?Basic Metabolic Panel: ?Recent Labs  ?Lab 03/20/21 ?1202 03/21/21 ?0318 03/22/21 ?0433 03/23/21 ?0413 03/24/21 ?0540  ?NA 140 142 138 136 138  ?K 3.9 3.9 3.6 3.9 3.6  ?CL 106 108 107 104 105  ?CO2 20* 21* '23 25 24  '$ ?GLUCOSE 139* 90 110* 92 99  ?BUN '18 19 16 16 16  '$ ?CREATININE 1.61* 1.46* 1.27* 1.22 1.31*  ?CALCIUM 10.0 9.8 9.1 9.4 9.5  ? ? ?Liver Function Tests: ?No results for input(s): AST, ALT, ALKPHOS, BILITOT, PROT, ALBUMIN in the last 168 hours. ?No results for input(s): LIPASE, AMYLASE  in the last 168 hours. ?No results for input(s): AMMONIA in the last 168 hours. ? ?CBC: ?Recent Labs  ?Lab 03/20/21 ?1202 03/24/21 ?0540  ?WBC 5.0 5.2  ?HGB 12.8* 12.1*  ?HCT 36.4* 33.3*  ?MCV 80.5 78.7*  ?PLT 125* 142*  ? ? ?Cardiac Enzymes: ?No results for input(s): CKTOTAL, CKMB, CKMBINDEX, TROPONINI in the last 168 hours. ? ?BNP: ?BNP (last 3 results) ?Recent Labs  ?  02/14/21 ?5093 03/20/21 ?1202  ?BNP 707.0* 3,221.1*  ? ? ?ProBNP (last 3 results) ?No results for input(s): PROBNP in the last 8760 hours. ? ? ? ?Other results: ? ?Imaging: ?No results found. ? ? ?Medications:   ? ? ?Scheduled Medications: ? aspirin EC  81 mg Oral Daily  ? Chlorhexidine Gluconate Cloth  6 each Topical Daily  ? digoxin  0.125 mg Oral Daily  ? empagliflozin  10 mg Oral Daily  ? heparin  5,000 Units Subcutaneous Q8H  ? potassium chloride  40 mEq Oral Once  ? rosuvastatin  40 mg Oral Daily  ? sacubitril-valsartan  1 tablet Oral BID  ? sodium chloride flush  10-40 mL Intracatheter Q12H  ? sodium chloride flush  3 mL Intravenous Q12H  ? spironolactone  25 mg Oral Daily  ? ? ?Infusions: ? sodium chloride Stopped (03/22/21 1205)  ? ? ?PRN Medications: ?sodium chloride, acetaminophen, ondansetron (ZOFRAN) IV, sodium chloride flush, sodium chloride flush ? ? ?Assessment/Plan:  ? ?  1. Acute on chronic systolic HF ?- Echo 1/61 with EF 20-25% Moderate MR. ?- R/LHC: Minimal non-obstructive CAD with separate left coronary ostia, severe NICM elevated filling pressures with normal cardiac output.  ?- cMRI: LVEF 09%, RV systolic function  60%, basal septal midwall late gadolinium enhancement (scar pattern seen in NICM and associated with worse prognosis), RV insertion LGE  ?- felt to have LBBB CM. (PVC burden not felt to be high enough to cause CM) Seen by EP 2/23  -> trial of GDMT if no response would need CRT-D ?- Now with recurrent low-output HF symptoms. Initial co-ox 72% ?- Echo 03/21/21  EF 20% RV ok. Mild AI/MR ?- co-ox 64%. Off milrinone.  ?-  CVP 4. Volume status stable. Tomorrow start torsemide 20 mg daily ?- Continue jardiance 10 mg daily  ?- Continue spiro to 25 mg daily ?- Continue Entresto 24/26 mg BID ?-Continue  digoxin 0.125 mg daily ?- Renal function stable.  ?- Given that he was re-admitted with low output HF in setting of LBBB CM and good GDMT. EP consulted plan to follow in the community.   ?  ?2. Elevated troponin ?- suspect due to HF ?- minimal CAD on cath 2/23 ?  ?3. AKI on CKD 3a suspect cardiorenal ?- Scr 1.4-1.5 at baseline 1.61 on admit ?- Stable SCr 1.3 ? ?4. LBBB  ?- EP saw 2/23 - recommend med optimization and repeat echo. CRT if EF remains < or = 35% after 3 months ?- as above, will reconnect with EP again given ongoing deterioration  ?- EP appreciated. Plan to follow in the community.  ?  ?5. PVCs/NSVT ?- follow on tele ?- keep K > 4.0 Mg > 2.0 ?  ?6. Minimal non-obstructive CAD ?- On ASA/statin ?- LDL 74 ?  ?7. DM2 ?-  A1c 5.4% ?- continue jardiance 10 mg daily  ?- cover SSI ?  ?8. OSA ?- awaiting new CPAP machine ?  ?9. Microcytic anemia ?- received feraheme 02/18/21 ?- Hgb 12.8 on admit ?- consider outpatient GI w/u ?  ?10. Thrombocytopenia, chronic ?- platelets 125k, were 142k on 02/14 ?- CBC today ? ? ?Length of Stay: 4 ? ? ?Amy Clegg NP-C  ?03/24/2021, 8:05 AM ? ?Advanced Heart Failure Team ?Pager [phone] (M-F; 7a - 4p)  ?Please contact [facility] for night-coverage after hours (4p -7a ) and weekends on [email or web address]  ? ?Patient seen with NP, agree with the above note.  ? ?No complaints today.  CVP 4 with co-ox 64% off milrinone.   ? ?General: NAD ?Neck: No JVD, no thyromegaly or thyroid nodule.  ?Lungs: Clear to auscultation bilaterally with normal respiratory effort. ?CV: Nondisplaced PMI.  Heart regular S1/S2, no S3/S4, no murmur.  No peripheral edema.   ?Abdomen: Soft, nontender, no hepatosplenomegaly, no distention.  ?Skin: Intact without lesions or rashes.  ?Neurologic: Alert and oriented x 3.  ?Psych: Normal  affect. ?Extremities: No clubbing or cyanosis.  ?HEENT: Normal.  ? ?Stable from cardiac standpoint, continue current GDMT.  He will start torsemide 20 mg daily tomorrow.  ? ?Renal US showed a large right renal solid mass concerning for renal cell CA.  Will get a renal protocol CT prior to discharge and will refer to urology as an outpatient.  Discussed with patient.  ? ?He can go home after CT with close followup in CHF clinic.  ? ?Loralie Champagne ?03/24/2021 ?9:10 AM ? ?

## 2021-03-24 NOTE — Plan of Care (Signed)

## 2021-03-24 NOTE — Progress Notes (Signed)
Ambulatory referral to Urology placed ? ?Lyda Jester, PA-C  ?

## 2021-03-24 NOTE — Progress Notes (Signed)
Transported to radiology for Renal Scan by bed awake and alert. ?

## 2021-03-24 NOTE — Evaluation (Signed)
Physical Therapy Evaluation ?Patient Details ?Name: Bryce Cain ?MRN: [id number] ?DOB: 14-Nov-1950 ?Today's Date: 03/24/2021 ? ?History of Present Illness ? Patient is a 71 y/o male who presents on 03/20/21 with SOB. Admitted with new systolic CHF. PMH includes OSA on CPAP, HTN, DM, CHF.  ?Clinical Impression ? Patient reports being independent for ADLs/IADLs and driving PTA. Today, pt tolerated ambulation and stair training with Mod I for safety. Pt reports feeling about 80% of his baseline. Noted to have max HR of 124 bpm and RR up to 40. Able to scoot around on towel with feet to clean up floor that was wet. Pt seems to be functioning close to baseline and does not require skilled therapy services. Discharge from therapy.   ?   ? ?Recommendations for follow up therapy are one component of a multi-disciplinary discharge planning process, led by the attending physician.  Recommendations may be updated based on patient status, additional functional criteria and insurance authorization. ? ?Follow Up Recommendations No PT follow up ? ?  ?Assistance Recommended at Discharge PRN  ?Patient can return home with the following ?   ? ?  ?Equipment Recommendations None recommended by PT  ?Recommendations for Other Services ?    ?  ?Functional Status Assessment Patient has not had a recent decline in their functional status  ? ?  ?Precautions / Restrictions Precautions ?Precautions: None ?Restrictions ?Weight Bearing Restrictions: No  ? ?  ? ?Mobility ? Bed Mobility ?Overal bed mobility: Modified Independent ?  ?  ?  ?  ?  ?  ?  ?  ? ?Transfers ?Overall transfer level: Modified independent ?Equipment used: None ?  ?  ?  ?  ?  ?  ?  ?  ?  ? ?Ambulation/Gait ?Ambulation/Gait assistance: Supervision ?Gait Distance (Feet): 400 Feet ?Assistive device: None, IV Pole ?Gait Pattern/deviations: Step-through pattern, Decreased stride length, Drifts right/left ?  ?Gait velocity interpretation: 1.31 - 2.62 ft/sec, indicative of limited  community ambulator ?  ?General Gait Details: Mostly steady gait with no DME, IV pole for convenience on the way back to the room. Mild drifting noted but no overt LOB. HR up to 124 bpm max and RR up to 40. ? ?Stairs ?Stairs: Yes ?Stairs assistance: Supervision ?Stair Management: Alternating pattern, One rail Right, No rails ?Number of Stairs: 2 ?General stair comments: Rails to ascend and none to descend, no difficulties. ? ?Wheelchair Mobility ?  ? ?Modified Rankin (Stroke Patients Only) ?  ? ?  ? ?Balance Overall balance assessment: Mild deficits observed, not formally tested ?  ?  ?  ?  ?  ?  ?  ?  ?  ?  ?  ?  ?  ?  ?  ?  ?  ?  ?   ? ? ? ?Pertinent Vitals/Pain Pain Assessment ?Pain Assessment: No/denies pain  ? ? ?Home Living Family/patient expects to be discharged to:: Private residence ?Living Arrangements: Spouse/significant other ?Available Help at Discharge: Family ?Type of Home: House ?Home Access: Stairs to enter ?  ?Entrance Stairs-Number of Steps: 3 ?  ?Home Layout: Two level;Able to live on main level with bedroom/bathroom ?Home Equipment: None ?   ?  ?Prior Function Prior Level of Function : Independent/Modified Independent;Driving ?  ?  ?  ?  ?  ?  ?Mobility Comments: No assistive device, drives. HAs a den upstairs ?  ?  ? ? ?Hand Dominance  ?   ? ?  ?Extremity/Trunk Assessment  ? Upper Extremity Assessment ?  Upper Extremity Assessment: Defer to OT evaluation ?  ? ?Lower Extremity Assessment ?Lower Extremity Assessment: Overall WFL for tasks assessed ?  ? ?Cervical / Trunk Assessment ?Cervical / Trunk Assessment: Kyphotic  ?Communication  ? Communication: No difficulties  ?Cognition Arousal/Alertness: Awake/alert ?Behavior During Therapy: [facility] for tasks assessed/performed ?Overall Cognitive Status: Within Functional Limits for tasks assessed ?  ?  ?  ?  ?  ?  ?  ?  ?  ?  ?  ?  ?  ?  ?  ?  ?  ?  ?  ? ?  ?General Comments   ? ?  ?Exercises    ? ?Assessment/Plan  ?  ?PT Assessment Patient does not need  any further PT services  ?PT Problem List   ? ?   ?  ?PT Treatment Interventions     ? ?PT Goals (Current goals can be found in the Care Plan section)  ?Acute Rehab PT Goals ?Patient Stated Goal: to go home ?PT Goal Formulation: All assessment and education complete, DC therapy ? ?  ?Frequency   ?  ? ? ?Co-evaluation   ?  ?  ?  ?  ? ? ?  ?AM-PAC PT "6 Clicks" Mobility  ?Outcome Measure Help needed turning from your back to your side while in a flat bed without using bedrails?: None ?Help needed moving from lying on your back to sitting on the side of a flat bed without using bedrails?: None ?Help needed moving to and from a bed to a chair (including a wheelchair)?: None ?Help needed standing up from a chair using your arms (e.g., wheelchair or bedside chair)?: None ?Help needed to walk in hospital room?: A Little ?Help needed climbing 3-5 steps with a railing? : A Little ?6 Click Score: 22 ? ?  ?End of Session   ?Activity Tolerance: Patient tolerated treatment well ?Patient left: in bed;with call bell/phone within reach;with nursing/sitter in room ?Nurse Communication: Mobility status ?PT Visit Diagnosis: Other abnormalities of gait and mobility (R26.89) ?  ? ?Time: 1093-2355 ?PT Time Calculation (min) (ACUTE ONLY): 24 min ? ? ?Charges:   PT Evaluation ?$PT Eval Low Complexity: 1 Low ?PT Treatments ?$Gait Training: 8-22 mins ?  ?   ? ? ?Marisa Severin, PT, DPT ?Acute Rehabilitation Services ?Pager [phone] ?Office [phone] ? ? ? ? ?[place] ?03/24/2021, 9:31 AM ? ?

## 2021-03-24 NOTE — TOC Transition Note (Signed)
Transition of Care (TOC) - CM/SW Discharge Note ? ? ?Patient Details  ?Name: Bryce Cain ?MRN: [id number] ?Date of Birth: Oct 28, 1950 ? ?Transition of Care [facility]) CM/SW Contact:  ?Marcheta Grammes Rexene Alberts, RN ?Phone Number: [phone] ?03/24/2021, 2:40 PM ? ? ?Clinical Narrative:    ?HF TOC new referral to arrange follow up at [facility]-urgent request. [facility] and appt arranged with Dr Minette Brine for 03/30/2021 at 1:15 pm.  ? ? ?  ?Barriers to Discharge: No Barriers Identified ? ? ?Patient Goals and CMS Choice ?  ?CMS Medicare.gov Compare Post Acute Care list provided to:: Patient ?  ? ?Discharge Placement ?  ?           ?  ?  ?  ?  ? ?Discharge Plan and Services ?  ?Discharge Planning Services: CM Consult ?           ?  ?  ?  ?  ?  ?  ?  ?  ?  ?  ? ?Social Determinants of Health (SDOH) Interventions ?  ? ? ?Readmission Risk Interventions ?No flowsheet data found. ? ? ? ? ?

## 2021-03-25 DIAGNOSIS — I5043 Acute on chronic combined systolic (congestive) and diastolic (congestive) heart failure: Secondary | ICD-10-CM | POA: Diagnosis not present

## 2021-03-25 DIAGNOSIS — G4733 Obstructive sleep apnea (adult) (pediatric): Secondary | ICD-10-CM | POA: Diagnosis not present

## 2021-03-26 ENCOUNTER — Encounter (HOSPITAL_COMMUNITY): Payer: Medicare PPO

## 2021-03-30 DIAGNOSIS — D49511 Neoplasm of unspecified behavior of right kidney: Secondary | ICD-10-CM | POA: Diagnosis not present

## 2021-04-02 ENCOUNTER — Other Ambulatory Visit (HOSPITAL_COMMUNITY): Payer: Self-pay

## 2021-04-02 ENCOUNTER — Encounter (HOSPITAL_COMMUNITY): Payer: Self-pay | Admitting: Internal Medicine

## 2021-04-02 ENCOUNTER — Ambulatory Visit (HOSPITAL_COMMUNITY)
Admit: 2021-04-02 | Discharge: 2021-04-02 | Disposition: A | Discharge status: Home / Self Care | Payer: Medicare PPO | Source: Ambulatory Visit | Attending: Internal Medicine | Admitting: Internal Medicine

## 2021-04-02 VITALS — BP 102/70 | HR 86 | Wt 215.0 lb

## 2021-04-02 DIAGNOSIS — N1831 Chronic kidney disease, stage 3a: Secondary | ICD-10-CM | POA: Diagnosis not present

## 2021-04-02 DIAGNOSIS — I447 Left bundle-branch block, unspecified: Secondary | ICD-10-CM | POA: Diagnosis not present

## 2021-04-02 DIAGNOSIS — Z7982 Long term (current) use of aspirin: Secondary | ICD-10-CM | POA: Insufficient documentation

## 2021-04-02 DIAGNOSIS — Z01810 Encounter for preprocedural cardiovascular examination: Secondary | ICD-10-CM

## 2021-04-02 DIAGNOSIS — L905 Scar conditions and fibrosis of skin: Secondary | ICD-10-CM | POA: Insufficient documentation

## 2021-04-02 DIAGNOSIS — I428 Other cardiomyopathies: Secondary | ICD-10-CM | POA: Diagnosis not present

## 2021-04-02 DIAGNOSIS — Z9989 Dependence on other enabling machines and devices: Secondary | ICD-10-CM | POA: Diagnosis not present

## 2021-04-02 DIAGNOSIS — G4733 Obstructive sleep apnea (adult) (pediatric): Secondary | ICD-10-CM | POA: Diagnosis not present

## 2021-04-02 DIAGNOSIS — I2510 Atherosclerotic heart disease of native coronary artery without angina pectoris: Secondary | ICD-10-CM | POA: Insufficient documentation

## 2021-04-02 DIAGNOSIS — Z79899 Other long term (current) drug therapy: Secondary | ICD-10-CM | POA: Diagnosis not present

## 2021-04-02 DIAGNOSIS — C641 Malignant neoplasm of right kidney, except renal pelvis: Secondary | ICD-10-CM | POA: Diagnosis not present

## 2021-04-02 DIAGNOSIS — N2889 Other specified disorders of kidney and ureter: Secondary | ICD-10-CM | POA: Diagnosis not present

## 2021-04-02 DIAGNOSIS — I130 Hypertensive heart and chronic kidney disease with heart failure and stage 1 through stage 4 chronic kidney disease, or unspecified chronic kidney disease: Secondary | ICD-10-CM | POA: Insufficient documentation

## 2021-04-02 DIAGNOSIS — Z7984 Long term (current) use of oral hypoglycemic drugs: Secondary | ICD-10-CM | POA: Insufficient documentation

## 2021-04-02 DIAGNOSIS — I5022 Chronic systolic (congestive) heart failure: Secondary | ICD-10-CM | POA: Diagnosis not present

## 2021-04-02 DIAGNOSIS — I509 Heart failure, unspecified: Secondary | ICD-10-CM | POA: Diagnosis not present

## 2021-04-02 DIAGNOSIS — I472 Ventricular tachycardia, unspecified: Secondary | ICD-10-CM | POA: Insufficient documentation

## 2021-04-02 DIAGNOSIS — I493 Ventricular premature depolarization: Secondary | ICD-10-CM

## 2021-04-02 DIAGNOSIS — D631 Anemia in chronic kidney disease: Secondary | ICD-10-CM | POA: Insufficient documentation

## 2021-04-02 DIAGNOSIS — E1122 Type 2 diabetes mellitus with diabetic chronic kidney disease: Secondary | ICD-10-CM | POA: Insufficient documentation

## 2021-04-02 LAB — CBC
HCT: 36.6 % — ABNORMAL LOW (ref 39.0–52.0)
Hemoglobin: 13.2 g/dL (ref 13.0–17.0)
MCH: 28.4 pg (ref 26.0–34.0)
MCHC: 36.1 g/dL — ABNORMAL HIGH (ref 30.0–36.0)
MCV: 78.9 fL — ABNORMAL LOW (ref 80.0–100.0)
Platelets: 174 10*3/uL (ref 150–400)
RBC: 4.64 MIL/uL (ref 4.22–5.81)
RDW: 15.5 % (ref 11.5–15.5)
WBC: 4.5 10*3/uL (ref 4.0–10.5)
nRBC: 0 % (ref 0.0–0.2)

## 2021-04-02 LAB — BASIC METABOLIC PANEL
Anion gap: 9 (ref 5–15)
BUN: 38 mg/dL — ABNORMAL HIGH (ref 8–23)
CO2: 25 mmol/L (ref 22–32)
Calcium: 10.1 mg/dL (ref 8.9–10.3)
Chloride: 102 mmol/L (ref 98–111)
Creatinine, Ser: 1.75 mg/dL — ABNORMAL HIGH (ref 0.61–1.24)
GFR, Estimated: 41 mL/min — ABNORMAL LOW (ref >60)
Glucose, Bld: 168 mg/dL — ABNORMAL HIGH (ref 70–99)
Potassium: 3.9 mmol/L (ref 3.5–5.1)
Sodium: 136 mmol/L (ref 135–145)

## 2021-04-02 LAB — BRAIN NATRIURETIC PEPTIDE: B Natriuretic Peptide: 415.6 pg/mL — ABNORMAL HIGH (ref 0.0–100.0)

## 2021-04-02 MED ORDER — SPIRONOLACTONE 25 MG PO TABS: 25.0000 mg | ORAL_TABLET | Freq: Every day | ORAL | 3 refills | Status: DC

## 2021-04-02 MED ORDER — DIGOXIN 125 MCG PO TABS: 0.1250 mg | ORAL_TABLET | Freq: Every day | ORAL | 3 refills | Status: DC

## 2021-04-02 MED ORDER — ROSUVASTATIN CALCIUM 40 MG PO TABS: 40.0000 mg | ORAL_TABLET | Freq: Every day | ORAL | 3 refills | Status: DC

## 2021-04-02 MED ORDER — EMPAGLIFLOZIN 10 MG PO TABS: 10.0000 mg | ORAL_TABLET | Freq: Every day | ORAL | 3 refills | Status: DC

## 2021-04-02 MED ORDER — SACUBITRIL-VALSARTAN 24-26 MG PO TABS: 1.0000 | ORAL_TABLET | Freq: Two times a day (BID) | ORAL | 3 refills | Status: DC

## 2021-04-02 MED ORDER — TORSEMIDE 20 MG PO TABS: 20.0000 mg | ORAL_TABLET | Freq: Every day | ORAL | 3 refills | Status: DC

## 2021-04-02 NOTE — Patient Instructions (Signed)
Labs done today, your results will be available in MyChart, we will contact you for abnormal readings. ? ?Your physician recommends that you schedule a follow-up appointment in: 2 month ? ?If you have any questions or concerns before your next appointment please send [place] a message through [place] or call our office at [phone].   ? ?TO LEAVE A MESSAGE FOR THE NURSE SELECT OPTION 2, PLEASE LEAVE A MESSAGE INCLUDING: ?YOUR NAME ?DATE OF BIRTH ?CALL BACK NUMBER ?REASON FOR CALL**this is important as we prioritize the call backs ? ?YOU WILL RECEIVE A CALL BACK THE SAME DAY AS LONG AS YOU CALL BEFORE 4:00 PM ? ?At the [facility], you and your health needs are our priority. As part of our continuing mission to provide you with exceptional heart care, we have created designated Provider Care Teams. These Care Teams include your primary Cardiologist (physician) and Advanced Practice Providers (APPs- Physician Assistants and Nurse Practitioners) who all work together to provide you with the care you need, when you need it.  ? ?You may see any of the following providers on your designated Care Team at your next follow up: ?Dr Glori Bickers ?Dr Loralie Champagne ?Darrick Grinder, NP ?Lyda Jester, PA ?Jessica Milford,NP ?Marlyce Huge, PA ?Audry Riles, PharmD ? ? ?Please be sure to bring in all your medications bottles to every appointment.  ? ? ?

## 2021-04-02 NOTE — Progress Notes (Signed)
? ?ADVANCED HF CLINIC CONSULT NOTE ? ? ?Primary Care: [facility] ?HF Cardiologist: Dr. Haroldine Laws ? ?HPI: ? 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 ?  ?Admitted 2/23 with new onset systolic heart failure. Echo EF 20-25% with anterior/apical WMA. Moderate MR. RV normal.Cath with minimal nonobstructive CAD. ? ?cMRI LVEF 16%, RVEF 52%, LGE basal septal midwall (scar pattern seen with NICM). Cardiomyopathy felt to be likely due to LBBB +/- frequent PVCs . EP consulted -> need to wait 3 months for CRT. Discharged home with LifeVest, weight 219 lbs. ? ?Readmitted in 03/20/21 with low output HF and transient shock. Treated with milrinone and improved. Repeat echo EF 20% . Milrinone weaned off. Seen by EP regarding CRT but recommended waiting 3 months for GDMT  ? ?Renal u/s found large right renal mass and CT confirmed RCC. Discharged home ? ?Seen in Urology Clinic and felt to need relatively urgent right nephrectomy for large right RCC ? ?Here for post hospital follow-up. Doing fairly well. No CP or SOB. Takes his time but can do all daily activities without too much problem. Can climb stairs to his office. No edema, orthopnea or PND. No dizziness. On torsemide 20 daily. Weight down 7 pounds since discharge.  ? ?Cardiac Studies: ?- Echo (2/23): EF 20-25% with anterior/apical WMA suggestive of previous anterior MI. Moderate MR. RV normal   ? ?- R/LHC (2/23): ?  Prox RCA lesion is 30% stenosed. ?  1st Diag lesion is 20% stenosed. ?  Prox Cx to Mid Cx lesion is 20% stenosed. ? ?Ao = 102/70 (85) ?LV = 104/24 ?RA =  10 ?RV = 47/17 ?PA = 52/22 (33) ?PCW = 25 ?Fick cardiac output/index = 5.5/2.5 ?PVR =1.5 WU ?Ao sat = 98% ?PA sat = 65%, 66% ?  ?Assessment: ?1. 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 Elevated filling pressures with normal cardiac output ?  ?- cMRI (2/23):  LVEF 16%, RVEF 52%, LGE basal septal midwall (scar pattern seen with NICM).  ? ? ? ?Past Medical History:  ?Diagnosis Date  ? DM (diabetes mellitus), type 2 ([place])   ? OSA on CPAP   ? ?Current Outpatient Medications  ?Medication Sig Dispense Refill  ? acidophilus (RISAQUAD) CAPS capsule Take 1 capsule by mouth daily.    ? aspirin EC 81 MG tablet Take 81 mg by mouth daily. Swallow whole.    ? digoxin (LANOXIN) 0.125 MG tablet Take 1 tablet (0.125 mg total) by mouth daily. 30 tablet 5  ? docusate sodium (COLACE) 100 MG capsule Take 100 mg by mouth as needed for mild constipation.    ? empagliflozin (JARDIANCE) 10 MG TABS tablet Take 1 tablet (10 mg total) by mouth daily. 30 tablet 1  ? Multiple Vitamins-Minerals (MULTIVITAMIN WITH MINERALS) tablet Take 1 tablet by mouth daily.    ? Omega-3 Fatty Acids (FISH OIL) 1000 MG CAPS Take 1,000 mg by mouth daily.    ? potassium chloride (KLOR-CON M) 10 MEQ tablet Take 1 tablet (10 mEq total) by mouth daily. 30 tablet 5  ? PREVIDENT 5000 ENAMEL PROTECT 1.1-5 % GEL Place 1 application. onto teeth at bedtime.    ? rosuvastatin (CRESTOR) 40 MG tablet Take 1 tablet (40 mg total) by mouth daily. 30 tablet 1  ? sacubitril-valsartan (ENTRESTO) 24-26 MG Take 1 tablet by mouth 2 (two) times daily. [place]  tablet 1  ? spironolactone (ALDACTONE) 25 MG tablet Take 1 tablet (25 mg total) by mouth daily. 45 tablet 3  ? torsemide (DEMADEX) 20 MG tablet Take 1 tablet (20 mg total) by mouth daily. 30 tablet 5  ? ?No current facility-administered medications for this encounter.  ? ?No Known Allergies ? ?Social History  ? ?Socioeconomic History  ? Marital status: Married  ?  Spouse name: Not on file  ? Number of children: Not on file  ? Years of education: Not on file  ? Highest education level: Not on file  ?Occupational History  ? Not on file  ?Tobacco Use  ? Smoking status: Former  ?  Types: Pipe  ?  Quit date: 02/05/1991  ?  Years since quitting: 30.1  ? Smokeless tobacco: Not on file  ?Substance and  Sexual Activity  ? Alcohol use: Not on file  ? Drug use: Not on file  ? Sexual activity: Not on file  ?Other Topics Concern  ? Not on file  ?Social History Narrative  ? Not on file  ? ?Social Determinants of Health  ? ?Financial Resource Strain: Not on file  ?Food Insecurity: Not on file  ?Transportation Needs: Not on file  ?Physical Activity: Not on file  ?Stress: Not on file  ?Social Connections: Not on file  ?Intimate Partner Violence: Not on file  ? ?Family History  ?Problem Relation Age of Onset  ? Heart attack Father   ? ?Wt Readings from Last 3 Encounters:  ?04/02/21 97.5 kg (215 lb)  ?03/24/21 98.6 kg (217 lb 6 oz)  ?03/02/21 102.5 kg (226 lb)  ? ?BP 102/70   Pulse 86   Wt 97.5 kg (215 lb)   SpO2 96%   BMI 30.85 kg/m?  ? ?PHYSICAL EXAM: ?General:  Well appearing. No resp difficulty ?HEENT: normal ?Neck: supple. no JVD. Carotids 2+ bilat; no bruits. No lymphadenopathy or thryomegaly appreciated. ?Cor: PMI nondisplaced. Regular rate & rhythm. No rubs, gallops or murmurs. ?Lungs: clear ?Abdomen: soft, nontender, nondistended. No hepatosplenomegaly. No bruits or masses. Good bowel sounds. ?Extremities: no cyanosis, clubbing, rash, edema ?Neuro: alert & orientedx3, cranial nerves grossly intact. moves all 4 extremities w/o difficulty. Affect pleasant ? ?ASSESSMENT & PLAN: ? ?1. Chronic systolic HF ?- Echo 3/00 EF 20-25% with anterior/apical WMA ?- R/LHC: Minimal non-obstructive CAD with separate left coronary ostia, severe NICM suspect due to LBBB, and elevated filling pressures with normal cardiac output.  ?- cMRI: LVEF 76%, RV systolic function  22%, basal septal midwall late gadolinium enhancement (scar pattern seen in NICM and associated with worse prognosis), RV insertion LGE  ?- Readmit ADHF/shock 3/23. Echo EF 20% Briefly required milrinone. Seen by EP again -> wait 3 months for CRT-D ?- NYHA II-III ?- Volume status ok  ?- Continue torsemide '20mg'$  daily  ?- Continue Jardiance 10 mg daily.  ?- Continue  spiro 25 mg daily.  ?- Continue Entresto 24/26 mg bid. ?- Continue digoxin 0.125 ?- Check labs today  ?  ?2. PVCs/NSVT ?- He has decided not to wear LifeVest ?- Check labs ?  ?3. DM2 ?- A1c 5.4% ?- On SGLT2i. ?  ?4. CKD 3a ?- Continue Jardiance. ?- BMET today. ?  ?5. OSA ?- Continue CPAP. ?- Needs to follow up with Dr. Maxwell Caul at [place]. ?  ?6. Minimal non-obstructive CAD ?- On ASA/statin. ?- LDL 74.  ? ?7. LBBB  ?- Optimize meds and repeat echo. CRT if EF remains < or = 35% after 3  months ?  ?8. Microcytic anemia ?- Received feraheme during admit. ?- Discussed further work up with PCP or GI.  ?  ?9. H/o thrombocytopenia ?- Mild ? ?10. Large right renal cell CA ?- Seen in Urology Clinic and felt to need relatively urgent right nephrectomy for large right RCC ?- Discussed situation in detail. With severe HF and recent cardiogenic shock he would be at very high risk for peri-operative cardiac complications and possibly death. I explained to him Urology's concern for resection ASAP to limit possibility of metastatic spread. ?- I suggested that we wait for 2 weeks to ensure clinical stability and then admit pre-op for cardiac optimization prior to proceeding with surgery  ? ?  ?Glori Bickers, MD  ?1:48 PM ? ? ?

## 2021-04-02 NOTE — Progress Notes (Signed)
Student Intern completed SDOH screening with pt. No interventions are needed at this time.  ? ?Su Grand, MSW student intern ?[facility] ?[place] ?[phone] ? ? ?

## 2021-04-03 ENCOUNTER — Other Ambulatory Visit: Payer: Self-pay | Admitting: Urology

## 2021-04-03 DIAGNOSIS — D49511 Neoplasm of unspecified behavior of right kidney: Secondary | ICD-10-CM

## 2021-04-07 ENCOUNTER — Ambulatory Visit (INDEPENDENT_AMBULATORY_CARE_PROVIDER_SITE_OTHER): Payer: Medicare PPO | Admitting: Nurse Practitioner

## 2021-04-07 VITALS — BP 105/70 | HR 80 | Temp 97.5°F | Wt 210.0 lb

## 2021-04-07 DIAGNOSIS — G4733 Obstructive sleep apnea (adult) (pediatric): Secondary | ICD-10-CM | POA: Diagnosis not present

## 2021-04-07 DIAGNOSIS — R0982 Postnasal drip: Secondary | ICD-10-CM

## 2021-04-07 DIAGNOSIS — Z09 Encounter for follow-up examination after completed treatment for conditions other than malignant neoplasm: Secondary | ICD-10-CM

## 2021-04-07 DIAGNOSIS — I5022 Chronic systolic (congestive) heart failure: Secondary | ICD-10-CM

## 2021-04-07 NOTE — Patient Instructions (Signed)
You were seen today in the [facility] for hospital follow up and to establish care. Labs were collected, results will be available via MyChart or, if abnormal, you will be contacted by clinic staff. You were prescribed medications, please take as directed. Please follow up in 3 mths for reevaluation of symptoms ?

## 2021-04-07 NOTE — Progress Notes (Signed)
? ?[facility] ?[place], [place]  [address] ?Phone:  [phone]   Fax:  [phone] ?Subjective:  ? Patient ID: Bryce Cain, male    DOB: [birth year], 71 y.o.   MRN: [id number] ? ?Chief Complaint  ?Patient presents with  ? Hospitalization Follow-up  ?  Pt is here for hospital follow up.  ? ?HPI ?Bryce Cain 71 y.o. male  has a past medical history of DM (diabetes mellitus), type 2 ([place]) and OSA on CPAP. To the [facility] for follow up status post hospital discharge and to establish care. Last visit with PCP was several years ago.  ? ?States that he went to the ED on 03/20/21 for shortness of breath and was admitted for 3-4 days. Shortness of breath as resolved since been discharged from the hospital. Has been compliant with CPAP since being discharged, states that he recently had CPAP titrated.  ? ?Concerned today about pink post nasal drip that has been occurring intermittently x 6 wks. Suspects that pink/clear post nasal drip is related to CPAP machine.  ? ?Would like to have his colonoscopy rescheduled, states that it was delayed by new diagnosis of CHF.  ? 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 Eats a balanced diet and exercising throughout the week. Currently resides with wife.  ? ?Denies any other concerns today. Denies any fever. Denies any fatigue, chest pain, shortness of breath, HA or dizziness. Denies any blurred vision, numbness or tingling. ? ? ?Past Medical History:  ?Diagnosis Date  ? DM (diabetes mellitus), type 2 ([place])   ? OSA on CPAP   ? ? ?Past Surgical History:  ?Procedure Laterality Date  ? RIGHT/LEFT HEART CATH AND CORONARY ANGIOGRAPHY N/A 02/16/2021  ? Procedure: RIGHT/LEFT HEART CATH AND CORONARY ANGIOGRAPHY;  Surgeon: Jolaine Artist, MD;  Location: [facility];  Service: Cardiovascular;  Laterality: N/A;  ? ? ?Family History  ?Problem Relation Age of Onset  ? Heart attack Father   ? ? ?Social History  ? ?Socioeconomic  History  ? Marital status: Married  ?  Spouse name: Not on file  ? Number of children: Not on file  ? Years of education: Not on file  ? Highest education level: Not on file  ?Occupational History  ? Not on file  ?Tobacco Use  ? Smoking status: Former  ?  Types: Pipe  ?  Quit date: 02/05/1991  ?  Years since quitting: 30.1  ? Smokeless tobacco: Not on file  ?Substance and Sexual Activity  ? Alcohol use: Not on file  ? Drug use: Not on file  ? Sexual activity: Not on file  ?Other Topics Concern  ? Not on file  ?Social History Narrative  ? Not on file  ? ?Social Determinants of Health  ? ?Financial Resource Strain: Low Risk   ? Difficulty of Paying Living Expenses: Not hard at all  ?Food Insecurity: No Food Insecurity  ? Worried About Charity fundraiser in the Last Year: Never true  ? Ran Out of Food in the Last Year: Never true  ?Transportation Needs: No Transportation Needs  ? Lack of Transportation (Medical): No  ? Lack of Transportation (Non-Medical): No  ?Physical Activity: Not on file  ?Stress: Not on file  ?Social Connections: Not on file  ?Intimate Partner Violence: Not on file  ? ? ?Outpatient Medications Prior to Visit  ?Medication Sig Dispense Refill  ? acidophilus (RISAQUAD) CAPS capsule Take 1 capsule  by mouth daily.    ? aspirin EC 81 MG tablet Take 81 mg by mouth daily. Swallow whole.    ? digoxin (LANOXIN) 0.125 MG tablet Take 1 tablet (0.125 mg total) by mouth daily. 90 tablet 3  ? docusate sodium (COLACE) 100 MG capsule Take 100 mg by mouth as needed for mild constipation.    ? empagliflozin (JARDIANCE) 10 MG TABS tablet Take 1 tablet (10 mg total) by mouth daily. 90 tablet 3  ? Multiple Vitamins-Minerals (MULTIVITAMIN WITH MINERALS) tablet Take 1 tablet by mouth daily.    ? Omega-3 Fatty Acids (FISH OIL) 1000 MG CAPS Take 1,000 mg by mouth daily.    ? potassium chloride (KLOR-CON M) 10 MEQ tablet Take 1 tablet (10 mEq total) by mouth daily. 30 tablet 5  ? PREVIDENT 5000 ENAMEL PROTECT 1.1-5 % GEL  Place 1 application. onto teeth at bedtime.    ? rosuvastatin (CRESTOR) 40 MG tablet Take 1 tablet (40 mg total) by mouth daily. 90 tablet 3  ? sacubitril-valsartan (ENTRESTO) 24-26 MG Take 1 tablet by mouth 2 (two) times daily. 180 tablet 3  ? spironolactone (ALDACTONE) 25 MG tablet Take 1 tablet (25 mg total) by mouth daily. 90 tablet 3  ? torsemide (DEMADEX) 20 MG tablet Take 1 tablet (20 mg total) by mouth daily. 90 tablet 3  ? ?No facility-administered medications prior to visit.  ? ? ?No Known Allergies ? ?Review of Systems  ?Constitutional:  Negative for chills, fever and malaise/fatigue.  ?HENT:  Negative for congestion, ear discharge, ear pain, hearing loss, nosebleeds, sinus pain, sore throat and tinnitus.   ?     See HPI  ?Eyes: Negative.   ?Respiratory:  Positive for shortness of breath. Negative for cough and stridor.   ?Cardiovascular:  Negative for chest pain, palpitations and leg swelling.  ?Gastrointestinal:  Negative for abdominal pain, blood in stool, constipation, diarrhea, nausea and vomiting.  ?Genitourinary: Negative.   ?Musculoskeletal: Negative.   ?Skin: Negative.   ?Neurological: Negative.   ?Psychiatric/Behavioral:  Negative for depression. The patient is not nervous/anxious.   ?All other systems reviewed and are negative. ? ?   ?Objective:  ?  ?Physical Exam ?Vitals reviewed.  ?Constitutional:   ?   General: He is not in acute distress. ?   Appearance: Normal appearance. He is normal weight.  ?HENT:  ?   Head: Normocephalic.  ?   Right Ear: Tympanic membrane, ear canal and external ear normal. There is no impacted cerumen.  ?   Left Ear: Tympanic membrane, ear canal and external ear normal. There is no impacted cerumen.  ?   Nose: Rhinorrhea present. No congestion.  ?   Mouth/Throat:  ?   Mouth: Mucous membranes are moist.  ?   Pharynx: Oropharynx is clear. No oropharyngeal exudate or posterior oropharyngeal erythema.  ?   Comments: Clear pink tinged nasal drainage noted on exam ?Eyes:  ?    General:     ?   Right eye: No discharge.  ?   Extraocular Movements: Extraocular movements intact.  ?   Pupils: Pupils are equal, round, and reactive to light.  ?Neck:  ?   Vascular: No carotid bruit.  ?Cardiovascular:  ?   Rate and Rhythm: Normal rate and regular rhythm.  ?   Pulses: Normal pulses.  ?   Heart sounds: Normal heart sounds.  ?   Comments: No obvious peripheral edema ?Pulmonary:  ?   Effort: Pulmonary effort is normal.  ?  Breath sounds: Normal breath sounds.  ?Abdominal:  ?   General: Abdomen is flat. Bowel sounds are normal. There is no distension.  ?   Palpations: Abdomen is soft. There is no mass.  ?   Tenderness: There is no abdominal tenderness. There is no right CVA tenderness, left CVA tenderness, guarding or rebound.  ?   Hernia: No hernia is present.  ?Musculoskeletal:     ?   General: No swelling, tenderness, deformity or signs of injury. Normal range of motion.  ?   Cervical back: Normal range of motion and neck supple. No rigidity or tenderness.  ?   Right lower leg: No edema.  ?   Left lower leg: No edema.  ?Lymphadenopathy:  ?   Cervical: No cervical adenopathy.  ?Skin: ?   General: Skin is warm and dry.  ?   Capillary Refill: Capillary refill takes less than 2 seconds.  ?Neurological:  ?   General: No focal deficit present.  ?   Mental Status: He is alert and oriented to person, place, and time.  ?Psychiatric:     ?   Mood and Affect: Mood normal.     ?   Behavior: Behavior normal.     ?   Thought Content: Thought content normal.     ?   Judgment: Judgment normal.  ? ? ?BP 105/70 (BP Location: Right Arm, Cuff Size: Normal)   Pulse 80   Temp (!) 97.5 ?F (36.4 ?C)   Wt 210 lb (95.3 kg)   SpO2 99%   BMI 30.13 kg/m?  ?Wt Readings from Last 3 Encounters:  ?04/07/21 210 lb (95.3 kg)  ?04/02/21 215 lb (97.5 kg)  ?03/24/21 217 lb 6 oz (98.6 kg)  ? ? ?Immunization History  ?Administered Date(s) Administered  ? PFIZER(Purple Top)SARS-COV-2 Vaccination 02/07/2019, 02/28/2019   ? ? ?Diabetic Foot Exam - Simple   ?No data filed ?  ? ? ?Lab Results  ?Component Value Date  ? TSH 0.662 02/14/2021  ? ?Lab Results  ?Component Value Date  ? WBC 4.5 04/02/2021  ? HGB 13.2 04/02/2021  ? HCT 36.6 (L) 03/3

## 2021-04-08 ENCOUNTER — Encounter: Payer: Self-pay | Admitting: Nurse Practitioner

## 2021-04-10 NOTE — Addendum Note (Signed)
Encounter addended by: Scarlette Calico, RN on: 04/10/2021 9:36 AM ? Actions taken: Clinical Note Signed

## 2021-04-10 NOTE — Progress Notes (Signed)
Surg clearance and OV note faxed to [facility] ?

## 2021-04-20 ENCOUNTER — Ambulatory Visit
Admission: RE | Admit: 2021-04-20 | Discharge: 2021-04-20 | Disposition: A | Discharge status: Home / Self Care | Payer: Medicare PPO | Source: Ambulatory Visit | Attending: Urology | Admitting: Urology

## 2021-04-20 DIAGNOSIS — I517 Cardiomegaly: Secondary | ICD-10-CM | POA: Diagnosis not present

## 2021-04-20 DIAGNOSIS — N2889 Other specified disorders of kidney and ureter: Secondary | ICD-10-CM | POA: Diagnosis not present

## 2021-04-20 DIAGNOSIS — D49511 Neoplasm of unspecified behavior of right kidney: Secondary | ICD-10-CM

## 2021-04-20 IMAGING — MR MR ABDOMEN WO/W CM
11 of 18 series · 27 of 48 positions shown · IV contrast (20ml multihance)
Comparison: CT [DATE]

CLINICAL DATA: Right renal mass.

EXAM:
MRI ABDOMEN WITHOUT AND WITH CONTRAST
TECHNIQUE: Multiplanar multisequence MR imaging of the abdomen was performed
both before and after the administration of intravenous contrast.
CONTRAST:  20mL MULTIHANCE GADOBENATE DIMEGLUMINE 529 MG/ML IV SOLN

[Series 4: cor haste · coronal · 5.0mm · 0.78mm/px · 2 of 35 slices shown]
[im 1/35]
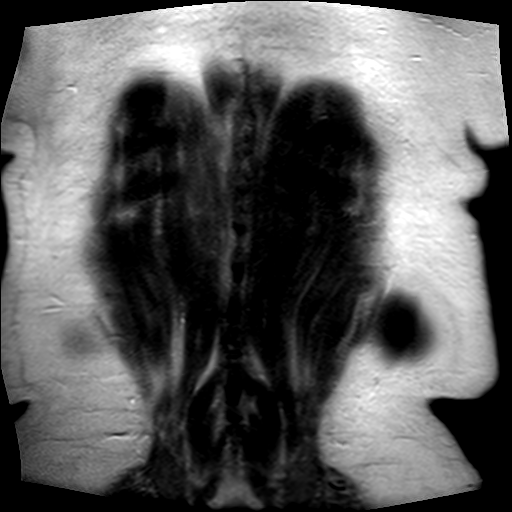
[im 35/35]
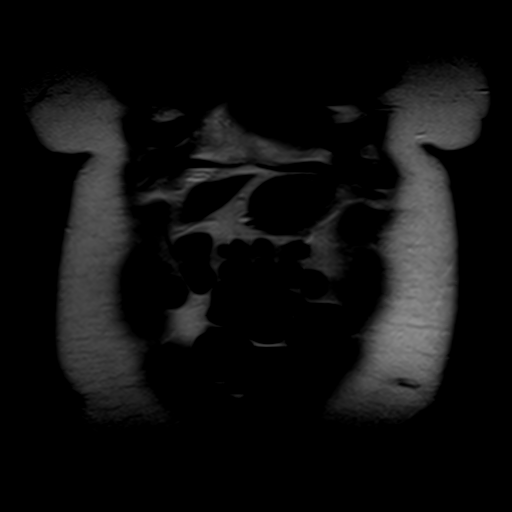

[Series 5: axial haste · axial · 6.0mm · 0.78mm/px · z∈[-148,+155]mm · 2 of 47 slices shown]
[im 1/47]
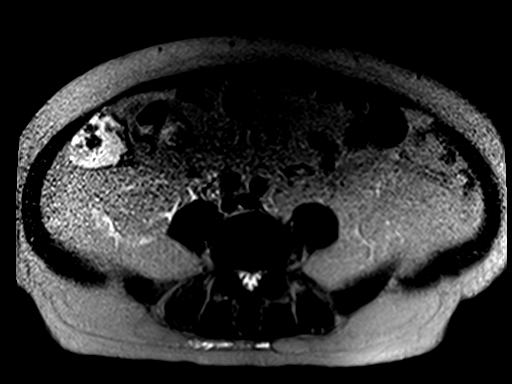
[im 47/47]
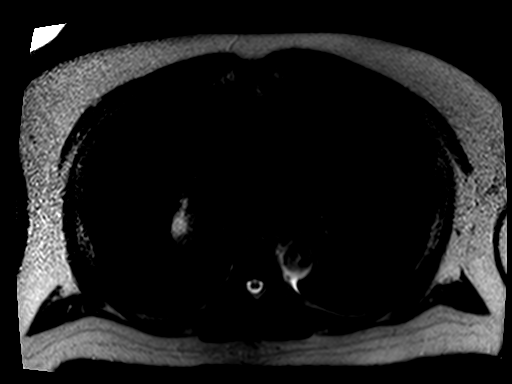

[Series 6: T1 · axial · 6.0mm · 0.78mm/px · z∈[-151,+159]mm · 4 of 96 slices shown]
[im 1/96]
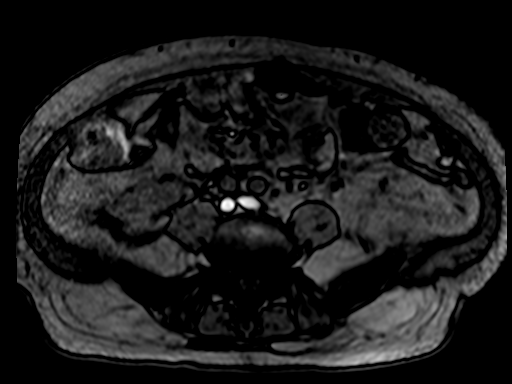
[im 32/96]
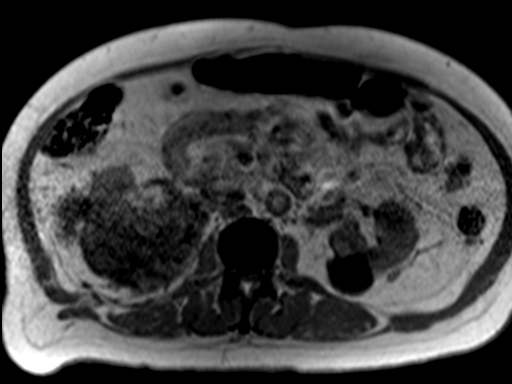
[im 64/96]
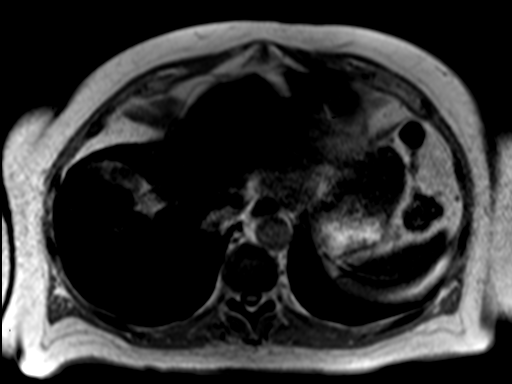
[im 96/96]
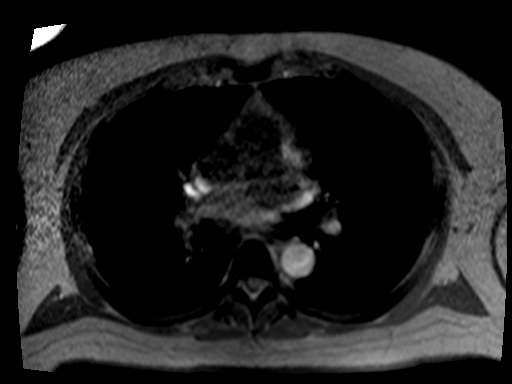

[Series 7: bSSFP · axial · 4.0mm · 0.78mm/px · z∈[-134,+142]mm · 3 of 70 slices shown (1 of 2)]
[im 1/70]
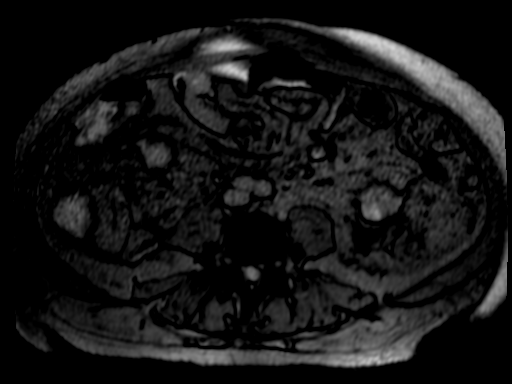
[im 35/70]
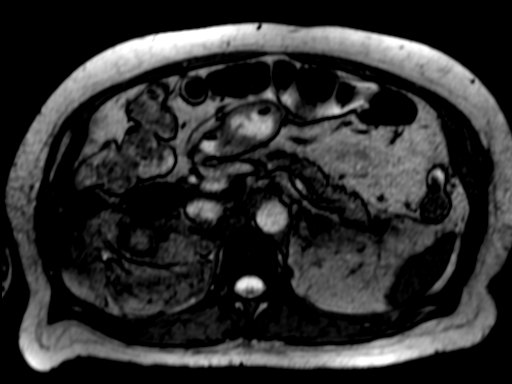
[im 70/70]
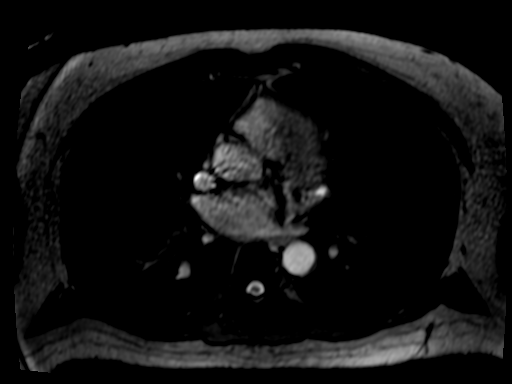

[Series 8: T2 fat-sat · axial · 6.0mm · 1.25mm/px · 1 of 45 slices shown]
[im 1/45]
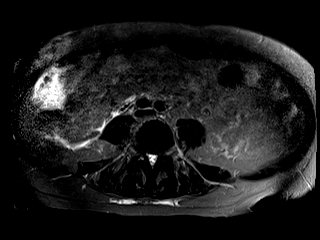

[Series 9: bSSFP · axial · 4.0mm · 0.78mm/px · z∈[-173,+103]mm · 2 of 70 slices shown (2 of 2)]
[im 1/70]
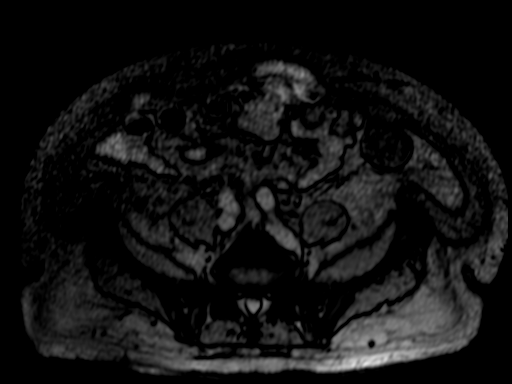
[im 70/70]
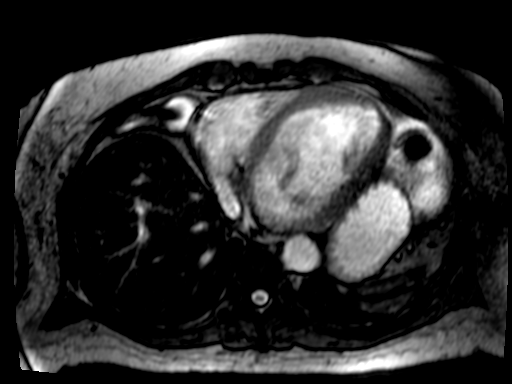

[Series 10: [id number]_trig · axial · 6.0mm · 2.08mm/px · z∈[-143,+173]mm · 4 of 135 slices shown]
[im 1/135]
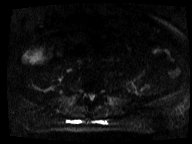
[im 45/135]
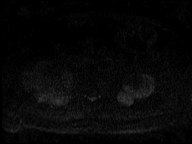
[im 90/135]
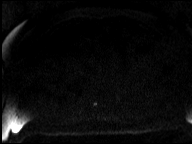
[im 135/135]
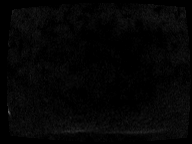

[Series 11: [id number]_trig_adc · axial · 6.0mm · 2.08mm/px · 1 of 45 slices shown]
[im 1/45]
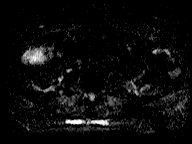

[Series 12: T1 dynamic · axial · non-contrast · 2.5mm · 0.78mm/px · z∈[-151,+106]mm · 3 of 104 slices shown]
[im 1/104]
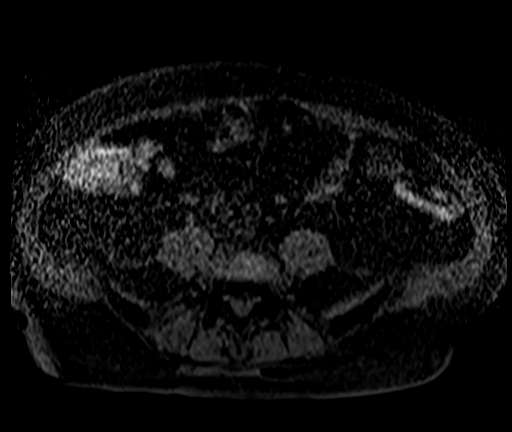
[im 52/104]
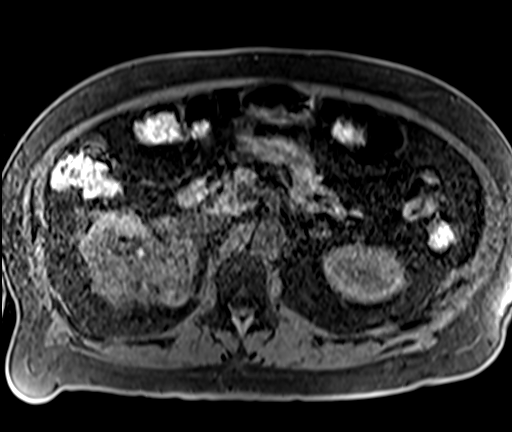
[im 104/104]
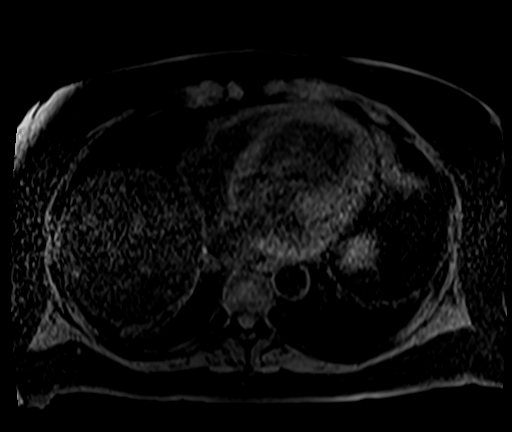

[Series 13: T1 dynamic post-contrast · axial · 2.5mm · 0.78mm/px · z∈[-151,+106]mm · 3 of 104 slices shown (1 of 2)]
[im 1/104]
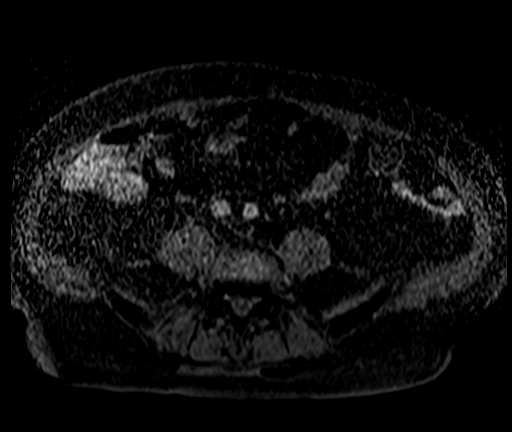
[im 52/104]
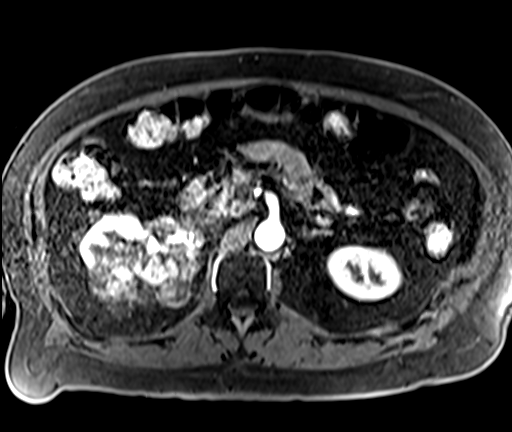
[im 104/104]
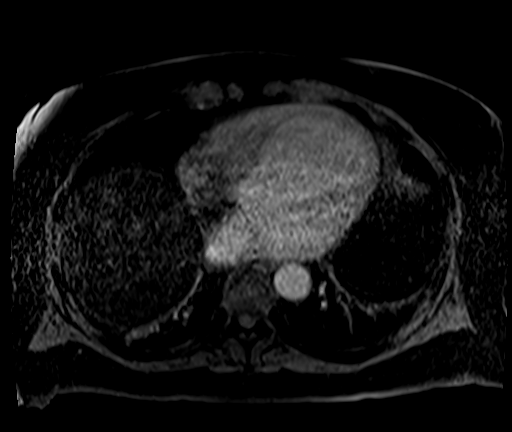

[Series 14: T1 dynamic post-contrast · axial · 2.5mm · 0.78mm/px · z∈[-151,-24]mm · 2 of 104 slices shown (2 of 2)]
[im 1/104]
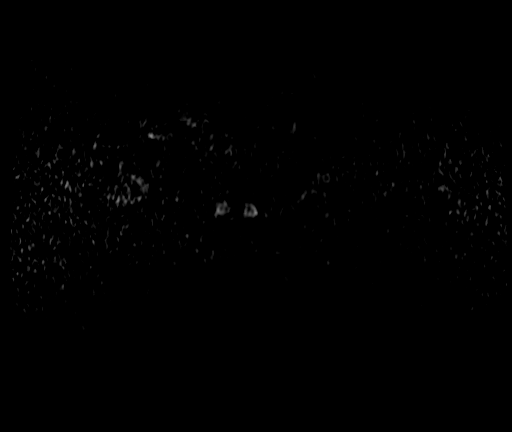
[im 52/104]
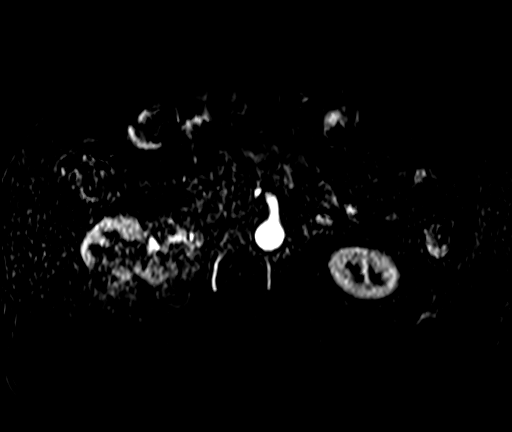

[27 of 48 positions shown; findings below may reference images not displayed]

FINDINGS: Despite efforts by the technologist and patient, motion artifact is
present on today's exam and could not be eliminated. This reduces
exam sensitivity and specificity.

Lower chest: No acute abnormality.  Left-sided cardiac enlargement.

Hepatobiliary: Hepatic parenchyma is intrinsically T2 hypointense
with loss of signal inphase imaging consistent with iron deposition.
No suspicious hepatic lesion. Gallbladder is unremarkable. No
biliary ductal dilation.

Pancreas: Intrinsic T1 signal of the pancreatic parenchyma is within
normal limits. No pancreatic ductal dilation. No cystic or solid
hyperenhancing pancreatic lesion identified.

Spleen: Splenic parenchyma is intrinsically T2 hypointense with loss
of signal on inphase imaging.

Adrenals/Urinary Tract: Mild thickening of the bilateral adrenal
glands without discrete nodularity similar prior.

No hydronephrosis.

Lobular heterogeneous solid enhancing right renal mass measures
x 6.6 cm. Mass extends into the renal sinus/hilum with encasement of
some hilar vessels but no discrete evidence of renal tumor in vein
on this motion degraded examination. Additionally, the mass appears
confined within the renal fascia. Nonenhancing T2 hyperintense right
renal lesions measure up to 8.3 cm are consistent with benign renal
cysts.

Well-circumscribed intrinsically T2 hyperintense left renal lesions
do not demonstrate postcontrast enhancement and measure up to 4 cm
on image 34/5, consistent with benign renal cysts.

Stomach/Bowel: Visualized portions within the abdomen are
unremarkable.

Vascular/Lymphatic: No definite evidence of tumor in renal vein on
this motion degraded examination. No abdominal adenopathy. Normal
caliber abdominal aorta.

Other:  No significant abdominal free fluid.

Musculoskeletal: No suspicious osseous lesion identified.
IMPRESSION: Despite efforts by the technologist and patient, motion artifact is
present on today's exam and could not be eliminated. This reduces
exam sensitivity and specificity.

1. Lobular solid enhancing right renal mass measures 9.8 x 6.6 cm,
most consistent with renal cell carcinoma. The mass extends into the
renal sinus with encasement of some hilar vessels but no discrete
evidence of renal tumor in vein on this motion degraded examination.
2. No evidence of metastatic disease in the abdomen.
3. Hepatic and splenic iron deposition possibly related to
transfusional hemosiderosis.

## 2021-04-20 MED ORDER — GADOBENATE DIMEGLUMINE 529 MG/ML IV SOLN
20.0000 mL | Freq: Once | INTRAVENOUS | Status: AC | PRN
Start: 2021-04-20 — End: 2021-04-20
  Administered 2021-04-20: 20 mL via INTRAVENOUS

## 2021-04-23 ENCOUNTER — Other Ambulatory Visit: Payer: Self-pay | Admitting: Urology

## 2021-04-29 DIAGNOSIS — N2889 Other specified disorders of kidney and ureter: Secondary | ICD-10-CM | POA: Diagnosis not present

## 2021-04-29 DIAGNOSIS — I5043 Acute on chronic combined systolic (congestive) and diastolic (congestive) heart failure: Secondary | ICD-10-CM | POA: Diagnosis not present

## 2021-04-29 DIAGNOSIS — G4733 Obstructive sleep apnea (adult) (pediatric): Secondary | ICD-10-CM | POA: Diagnosis not present

## 2021-04-29 DIAGNOSIS — J3489 Other specified disorders of nose and nasal sinuses: Secondary | ICD-10-CM | POA: Diagnosis not present

## 2021-04-30 ENCOUNTER — Other Ambulatory Visit (HOSPITAL_COMMUNITY): Payer: Self-pay | Admitting: Physician Assistant

## 2021-04-30 ENCOUNTER — Other Ambulatory Visit (HOSPITAL_COMMUNITY): Payer: Self-pay | Admitting: Internal Medicine

## 2021-05-13 DIAGNOSIS — J3489 Other specified disorders of nose and nasal sinuses: Secondary | ICD-10-CM | POA: Diagnosis not present

## 2021-05-13 DIAGNOSIS — N2889 Other specified disorders of kidney and ureter: Secondary | ICD-10-CM | POA: Diagnosis not present

## 2021-05-13 DIAGNOSIS — I5043 Acute on chronic combined systolic (congestive) and diastolic (congestive) heart failure: Secondary | ICD-10-CM | POA: Diagnosis not present

## 2021-05-13 DIAGNOSIS — G4733 Obstructive sleep apnea (adult) (pediatric): Secondary | ICD-10-CM | POA: Diagnosis not present

## 2021-05-19 ENCOUNTER — Ambulatory Visit: Payer: Medicare PPO | Admitting: Cardiology

## 2021-05-19 ENCOUNTER — Encounter: Payer: Self-pay | Admitting: Cardiology

## 2021-05-19 VITALS — BP 92/58 | HR 88 | Ht 70.0 in | Wt 216.0 lb

## 2021-05-19 DIAGNOSIS — I5022 Chronic systolic (congestive) heart failure: Secondary | ICD-10-CM

## 2021-05-19 DIAGNOSIS — I447 Left bundle-branch block, unspecified: Secondary | ICD-10-CM

## 2021-05-19 NOTE — Patient Instructions (Signed)
Medication Instructions:  ?Your physician recommends that you continue on your current medications as directed. Please refer to the Current Medication list given to you today. ? ?*If you need a refill on your cardiac medications before your next appointment, please call your pharmacy* ? ? ?Lab Work: ?None ordered ? ? ?Testing/Procedures: ?None ordered ? ? ?Follow-Up: ?At [facility], you and your health needs are our priority.  As part of our continuing mission to provide you with exceptional heart care, we have created designated Provider Care Teams.  These Care Teams include your primary Cardiologist (physician) and Advanced Practice Providers (APPs -  Physician Assistants and Nurse Practitioners) who all work together to provide you with the care you need, when you need it. ? ?Your next appointment:   ?July    ? ?The format for your next appointment:   ?In Person ? ?Provider:   ?Lars Mage, MD  ? ? ?Thank you for choosing [facility]!! ? ? ?[phone] ? ?Other Instructions ? ? ?Important Information About Sugar ? ? ? ? ? ? ? ? ?  ?

## 2021-05-19 NOTE — Progress Notes (Signed)
?Electrophysiology Office Note:   ? ?Date:  05/19/2021  ? ?ID:  Bryce Cain, DOB 22-May-1950, MRN [id number] ? ?PCP:  Teena Dunk, NP  ?[facility] Cardiologist:  None  ?[place] Electrophysiologist:  None  ? ?Referring MD: [facility] *  ? ?Chief Complaint: CSHF ? ?History of Present Illness:   ? ?Bryce Cain is a 71 y.o. male who presents for post hospital follow-up and to revisit consideration of CRT-D at the request of [facility]. Their medical history includes chronic systolic CHF, CAD, hypertension, type 2 diabetes mellitus, and OSA on CPAP. ? ?He was admitted to the hospital in 02/2021 with new acute systolic CHF. Cardiac MRI revealed LVEF 16% and RVEF 52His cardiomyopathy was felt to be likely due to LBBB. Planned for CRT-D if his EF remained persistently reduced. Then he was readmitted 03/20/2021 with acute on chronic systolic CHF. Echo 03/21/21 showed EF 20%. EP evaluated for consideration of CRT-D due to recurrent admission for decompensated HF, recommended optimizing medical therapy and f/u as outpatient to discuss device.  ? ?He followed up with Dr. Haroldine Laws 04/02/2021. It was noted that after being seen in Urology Clinic it was felt he needed relatively urgent right nephrectomy for large right renal cell CA. They discussed his very high risk for peri-operative cardiac complications and possibly death. It was recommended to wait 2 weeks to ensure clinical stability and then admit pre-op for cardiac optimization prior to proceeding with surgery. ? ?Overall, he has been feeling fine. However, he did suffer from severe intestinal flu about 2.5-3 weeks ago. At this time he has recovered well. His appetite has returned to normal. ? ?He confirms that he is scheduled to be admitted 6/8 with his nephrectomy to be performed the following day. ? ?He denies any palpitations, chest pain, shortness of breath, or peripheral edema. No lightheadedness, headaches, syncope,  orthopnea, or PND. ? ? ?  ?Past Medical History:  ?Diagnosis Date  ? DM (diabetes mellitus), type 2 ([place])   ? OSA on CPAP   ? ? ?Past Surgical History:  ?Procedure Laterality Date  ? RIGHT/LEFT HEART CATH AND CORONARY ANGIOGRAPHY N/A 02/16/2021  ? Procedure: RIGHT/LEFT HEART CATH AND CORONARY ANGIOGRAPHY;  Surgeon: Jolaine Artist, MD;  Location: [place] [facility];  Service: Cardiovascular;  Laterality: N/A;  ? ? ?Current Medications: ?Current Meds  ?Medication Sig  ? acidophilus (RISAQUAD) CAPS capsule Take 1 capsule by mouth daily.  ? aspirin EC 81 MG tablet Take 81 mg by mouth daily. Swallow whole.  ? digoxin (LANOXIN) 0.125 MG tablet Take 1 tablet (0.125 mg total) by mouth daily.  ? docusate sodium (COLACE) 100 MG capsule Take 100 mg by mouth as needed for mild constipation.  ? empagliflozin (JARDIANCE) 10 MG TABS tablet Take 1 tablet (10 mg total) by mouth daily.  ? Multiple Vitamins-Minerals (MULTIVITAMIN WITH MINERALS) tablet Take 1 tablet by mouth daily.  ? Omega-3 Fatty Acids (FISH OIL) 1000 MG CAPS Take 1,000 mg by mouth daily.  ? potassium chloride (KLOR-CON M) 10 MEQ tablet Take 1 tablet (10 mEq total) by mouth daily.  ? PREVIDENT 5000 ENAMEL PROTECT 1.1-5 % GEL Place 1 application. onto teeth at bedtime.  ? rosuvastatin (CRESTOR) 40 MG tablet Take 1 tablet (40 mg total) by mouth daily.  ? sacubitril-valsartan (ENTRESTO) 24-26 MG Take 1 tablet by mouth 2 (two) times daily.  ? spironolactone (ALDACTONE) 25 MG tablet Take 1 tablet (25 mg total) by mouth daily.  ? torsemide (  DEMADEX) 20 MG tablet Take 1 tablet (20 mg total) by mouth daily.  ?  ? ?Allergies:   Patient has no known allergies.  ? ?Social History  ? ?Socioeconomic History  ? Marital status: Married  ?  Spouse name: Not on file  ? Number of children: Not on file  ? Years of education: Not on file  ? Highest education level: Not on file  ?Occupational History  ? Not on file  ?Tobacco Use  ? Smoking status: Former  ?  Types: Pipe  ?  Quit  date: 02/05/1991  ?  Years since quitting: 30.3  ? Smokeless tobacco: Not on file  ?Substance and Sexual Activity  ? Alcohol use: Not on file  ? Drug use: Not on file  ? Sexual activity: Not on file  ?Other Topics Concern  ? Not on file  ?Social History Narrative  ? Not on file  ? ?Social Determinants of Health  ? ?Financial Resource Strain: Low Risk   ? Difficulty of Paying Living Expenses: Not hard at all  ?Food Insecurity: No Food Insecurity  ? Worried About Charity fundraiser in the Last Year: Never true  ? Ran Out of Food in the Last Year: Never true  ?Transportation Needs: No Transportation Needs  ? Lack of Transportation (Medical): No  ? Lack of Transportation (Non-Medical): No  ?Physical Activity: Not on file  ?Stress: Not on file  ?Social Connections: Not on file  ?  ? ?Family History: ?The patient's family history includes Heart attack in his father. ? ?ROS:   ?Please see the history of present illness.    ? ?All other systems reviewed and are negative. ? ?EKGs/Labs/Other Studies Reviewed:   ? ?The following studies were reviewed today: ? ?04/20/2021  MRI Abdomen ?IMPRESSION: ?Despite efforts by the technologist and patient, motion artifact is ?present on today's exam and could not be eliminated. This reduces ?exam sensitivity and specificity. ?  ?1. Lobular solid enhancing right renal mass measures 9.8 x 6.6 cm, ?most consistent with renal cell carcinoma. The mass extends into the ?renal sinus with encasement of some hilar vessels but no discrete ?evidence of renal tumor in vein on this motion degraded examination. ?2. No evidence of metastatic disease in the abdomen. ?3. 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 ? ?03/21/2021  Echo ? 1. Cannot rule out LV thrombus. Left ventricular ejection fraction, by  ?estimation, is 20%. The left ventricle has severely decreased function.  ?The left ventricle demonstrates global hypokinesis. The left ventricular  ?internal  cavity size was severely  ?dilated. Left ventricular diastolic parameters are indeterminate.  ? 2. Right ventricular systolic function is normal. The right ventricular  ?size is normal. Tricuspid regurgitation signal is inadequate for assessing  ?PA pressure.  ? 3. Left atrial size was severely dilated.  ? 4. Mild mitral valve regurgitation.  ? 5. Aortic valve regurgitation is mild. Aortic valve  ?sclerosis/calcification is present, without any evidence of aortic  ?stenosis.  ? 6. Pulmonic valve regurgitation is moderate.  ? 7. 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  ? 8. The inferior vena cava is dilated in size with >50% respiratory  ?variability, suggesting right atrial pressure of 8 mmHg.  ? ?Conclusion(s)/Recommendation(s): Compared to prior echo in 02/2021 Possible  ?LV thrombus. Consider repeat limited study with contrast.  ? ?02/17/2021  Cardiac MRI ?FINDINGS: ?Left ventricle: ?  ?-Severely dilated ?  ?-Severe systolic dysfunction. Septal dyskinesis consistent with LBBB ?  ?-Elevated ECV (  36%) ?  ?-Basal septal midwall LGE ?  ?-RV insertion site LGE ?  ?LV EF: 16% (Normal 56-78%) ?  ?Absolute volumes: ?  ?LV EDV: 569m (Normal 77-195 mL) ?  ?LV ESV: 4434m(Normal 19-72 mL) ?  ?LV SV: 8574mNormal 51-133 mL) ?  ?CO: 7.1L/min (Normal 2.8-8.8 L/min) ?  ?Indexed volumes: ?  ?LV EDV: 237m15m-m (Normal 47-92 mL/sq-m) ?  ?LV ESV: 199mL29mm (Normal 13-30 mL/sq-m) ?  ?LV SV: 38mL/65m (Normal 32-62 mL/sq-m) ?  ?CI: 3.2L/min/sq-m (Normal 1.7-4.2 L/min/sq-m) ?  ?Right ventricle: Normal size and systolic function ?  ?RV EF:  52% (Normal 47-74%) ?  ?Absolute volumes: ?  ?RV EDV: 160mL (55mal 88-227 mL) ?  ?RV ESV: 76mL (N63ml 23-103 mL) ?  ?RV SV: 84mL (No24m 52-138 mL) ?  ?CO: 7.0L/min (Normal 2.8-8.8 L/min) ?  ?Indexed volumes: ?  ?RV EDV: 71mL/sq-m26mrmal 55-105 mL/sq-m) ?  ?RV ESV: 34mL/sq-m 78mmal 15-43 mL/sq-m) ?  ?RV SV: 37mL/sq-m (3mal 32-64 mL/sq-m) ?  ?CI: 3.1L/min/sq-m (Normal 1.7-4.2 L/min/sq-m) ?  ?Left  atrium: Mild enlargement ?  ?Right atrium: Normal size ?  ?Mitral valve: Mild regurgitation ?  ?Aortic valve: Mild regurgitation ?  ?Tricuspid valve: Mild regurgitation ?  ?Pulmonic valve: Mild regurgitation ?  ?Ao

## 2021-05-25 ENCOUNTER — Ambulatory Visit: Payer: Medicare PPO | Admitting: Internal Medicine

## 2021-05-25 ENCOUNTER — Encounter: Payer: Self-pay | Admitting: Internal Medicine

## 2021-05-25 VITALS — BP 90/58 | HR 76 | Temp 98.2°F | Ht 70.0 in | Wt 216.3 lb

## 2021-05-25 DIAGNOSIS — C641 Malignant neoplasm of right kidney, except renal pelvis: Secondary | ICD-10-CM

## 2021-05-25 DIAGNOSIS — I5022 Chronic systolic (congestive) heart failure: Secondary | ICD-10-CM | POA: Diagnosis not present

## 2021-05-25 DIAGNOSIS — G4733 Obstructive sleep apnea (adult) (pediatric): Secondary | ICD-10-CM

## 2021-05-25 DIAGNOSIS — Z9989 Dependence on other enabling machines and devices: Secondary | ICD-10-CM

## 2021-05-25 DIAGNOSIS — C649 Malignant neoplasm of unspecified kidney, except renal pelvis: Secondary | ICD-10-CM | POA: Insufficient documentation

## 2021-05-25 DIAGNOSIS — E1169 Type 2 diabetes mellitus with other specified complication: Secondary | ICD-10-CM | POA: Diagnosis not present

## 2021-05-25 LAB — POCT GLYCOSYLATED HEMOGLOBIN (HGB A1C): Hemoglobin A1C: 5.9 % — AB (ref 4.0–5.6)

## 2021-05-25 NOTE — Progress Notes (Signed)
New Patient Office Visit     CC/Reason for Visit: Establish care, discuss chronic concerns Previous PCP: [facility] Last Visit: Unknown  HPI: Bryce Cain is a 71 y.o. male who is coming in today for the above mentioned reasons.  He has a complex past medical history significant for nonischemic cardiomyopathy with an ejection fraction of around 20% with fairly recent admission to the hospital for cardiogenic shock.  He is followed by Dr. Haroldine Laws.  He has also been seen by Dr. Quentin Ore regarding CRT.  Unfortunately he has also recently been diagnosed with what appears to be a right renal cell carcinoma and will require semiurgent nephrectomy.  This has been scheduled for June 8.  There has been careful planning between his cardiology team and his urologist for this.  He is also known to be a type II diabetic and have obstructive sleep apnea who is compliant with CPAP therapy.  He feels well today without concerns or complaints.  He is a retired Ambulance person.  He was a smoker but quit over 35 years ago, very occasional alcohol use, no known drug allergies.  No family or past surgical history of importance.  Past Medical/Surgical History: Past Medical History:  Diagnosis Date   DM (diabetes mellitus), type 2 ([place])    OSA on CPAP     Past Surgical History:  Procedure Laterality Date   RIGHT/LEFT HEART CATH AND CORONARY ANGIOGRAPHY N/A 02/16/2021   Procedure: RIGHT/LEFT HEART CATH AND CORONARY ANGIOGRAPHY;  Surgeon: Jolaine Artist, MD;  Location: [place] [facility];  Service: Cardiovascular;  Laterality: N/A;    Social History:  reports that he quit smoking about 30 years ago. His smoking use included pipe. He does not have any smokeless tobacco history on file. No history on file for alcohol use and drug use.  Allergies: No Known Allergies  Family History:  Family History  Problem Relation Age of Onset   Heart attack Father      Current  Outpatient Medications:    acidophilus (RISAQUAD) CAPS capsule, Take 1 capsule by mouth daily., Disp: , Rfl:    aspirin EC 81 MG tablet, Take 81 mg by mouth daily. Swallow whole., Disp: , Rfl:    digoxin (LANOXIN) 0.125 MG tablet, Take 1 tablet (0.125 mg total) by mouth daily., Disp: 90 tablet, Rfl: 3   docusate sodium (COLACE) 100 MG capsule, Take 100 mg by mouth as needed for mild constipation., Disp: , Rfl:    empagliflozin (JARDIANCE) 10 MG TABS tablet, Take 1 tablet (10 mg total) by mouth daily., Disp: 90 tablet, Rfl: 3   Multiple Vitamins-Minerals (MULTIVITAMIN WITH MINERALS) tablet, Take 1 tablet by mouth daily., Disp: , Rfl:    Omega-3 Fatty Acids (FISH OIL) 1000 MG CAPS, Take 1,000 mg by mouth daily., Disp: , Rfl:    potassium chloride (KLOR-CON M) 10 MEQ tablet, Take 1 tablet (10 mEq total) by mouth daily., Disp: 30 tablet, Rfl: 5   PREVIDENT 5000 ENAMEL PROTECT 1.1-5 % GEL, Place 1 application. onto teeth at bedtime., Disp: , Rfl:    rosuvastatin (CRESTOR) 40 MG tablet, Take 1 tablet (40 mg total) by mouth daily., Disp: 90 tablet, Rfl: 3   sacubitril-valsartan (ENTRESTO) 24-26 MG, Take 1 tablet by mouth 2 (two) times daily., Disp: 180 tablet, Rfl: 3   spironolactone (ALDACTONE) 25 MG tablet, Take 1 tablet (25 mg total) by mouth daily., Disp: 90 tablet, Rfl: 3   torsemide (DEMADEX) 20 MG tablet, Take  1 tablet (20 mg total) by mouth daily., Disp: 90 tablet, Rfl: 3  Review of Systems:  Constitutional: Denies fever, chills, diaphoresis, appetite change and fatigue.  HEENT: Denies photophobia, eye pain, redness, hearing loss, ear pain, congestion, sore throat, rhinorrhea, sneezing, mouth sores, trouble swallowing, neck pain, neck stiffness and tinnitus.   Respiratory: Denies SOB, DOE, cough, chest tightness,  and wheezing.   Cardiovascular: Denies chest pain, palpitations and leg swelling.  Gastrointestinal: Denies nausea, vomiting, abdominal pain, diarrhea, constipation, blood in stool and  abdominal distention.  Genitourinary: Denies dysuria, urgency, frequency, hematuria, flank pain and difficulty urinating.  Endocrine: Denies: hot or cold intolerance, sweats, changes in hair or nails, polyuria, polydipsia. Musculoskeletal: Denies myalgias, back pain, joint swelling, arthralgias and gait problem.  Skin: Denies pallor, rash and wound.  Neurological: Denies dizziness, seizures, syncope, weakness, light-headedness, numbness and headaches.  Hematological: Denies adenopathy. Easy bruising, personal or family bleeding history  Psychiatric/Behavioral: Denies suicidal ideation, mood changes, confusion, nervousness, sleep disturbance and agitation    Physical Exam: Vitals:   05/25/21 1004  BP: (!) 90/58  Pulse: 76  Temp: 98.2 F (36.8 C)  TempSrc: Oral  Weight: 216 lb 4.8 oz (98.1 kg)  Height: '5\' 10"'$  (1.778 m)   Body mass index is 31.04 kg/m.   Constitutional: NAD, calm, comfortable Eyes: PERRL, lids and conjunctivae normal, wears corrective lenses ENMT: Mucous membranes are moist.  Respiratory: clear to auscultation bilaterally, no wheezing, no crackles. Normal respiratory effort. No accessory muscle use.  Cardiovascular: Regular rate and rhythm, no murmurs / rubs / gallops. No extremity edema.  Neurologic: Grossly intact and nonfocal Psychiatric: Normal judgment and insight. Alert and oriented x 3. Normal mood.    Impression and Plan:  Type 2 diabetes mellitus with other specified complication, without long-term current use of insulin ([place])  - Plan: POC HgB A1c -Well-controlled with an A1c of 5.9 in office today.  Continue Jardiance.  Chronic systolic congestive heart failure ([place]) -Appears compensated at present, followed closely by cardiology.  He is on aspirin, digoxin, Entresto, Jardiance, spironolactone, rosuvastatin, torsemide.  OSA on CPAP -On nightly CPAP, noted.  Renal cell carcinoma of right kidney ([place]) -Plan for nephrectomy on June 8.  Time  spent: 48 minutes reviewing chart, interviewing and examining patient and formulating plan of care.     Lelon Frohlich, MD [facility]

## 2021-05-27 ENCOUNTER — Other Ambulatory Visit (HOSPITAL_COMMUNITY): Payer: Self-pay

## 2021-05-27 MED ORDER — POTASSIUM CHLORIDE CRYS ER 10 MEQ PO TBCR: 10.0000 meq | EXTENDED_RELEASE_TABLET | Freq: Every day | ORAL | 3 refills | Status: DC

## 2021-05-28 DIAGNOSIS — N390 Urinary tract infection, site not specified: Secondary | ICD-10-CM | POA: Diagnosis not present

## 2021-05-28 DIAGNOSIS — D49511 Neoplasm of unspecified behavior of right kidney: Secondary | ICD-10-CM | POA: Diagnosis not present

## 2021-05-29 ENCOUNTER — Ambulatory Visit (HOSPITAL_COMMUNITY)
Admission: RE | Admit: 2021-05-29 | Discharge: 2021-05-29 | Disposition: A | Discharge status: Home / Self Care | Payer: Medicare PPO | Source: Ambulatory Visit | Attending: Internal Medicine | Admitting: Internal Medicine

## 2021-05-29 ENCOUNTER — Ambulatory Visit (HOSPITAL_BASED_OUTPATIENT_CLINIC_OR_DEPARTMENT_OTHER)
Admission: RE | Admit: 2021-05-29 | Discharge: 2021-05-29 | Disposition: A | Discharge status: Home / Self Care | Payer: Medicare PPO | Source: Ambulatory Visit | Attending: Internal Medicine | Admitting: Internal Medicine

## 2021-05-29 ENCOUNTER — Encounter (HOSPITAL_COMMUNITY): Payer: Self-pay | Admitting: Internal Medicine

## 2021-05-29 VITALS — BP 102/58 | HR 72 | Wt 215.2 lb

## 2021-05-29 DIAGNOSIS — I472 Ventricular tachycardia, unspecified: Secondary | ICD-10-CM | POA: Insufficient documentation

## 2021-05-29 DIAGNOSIS — G4733 Obstructive sleep apnea (adult) (pediatric): Secondary | ICD-10-CM | POA: Diagnosis not present

## 2021-05-29 DIAGNOSIS — C641 Malignant neoplasm of right kidney, except renal pelvis: Secondary | ICD-10-CM

## 2021-05-29 DIAGNOSIS — E1122 Type 2 diabetes mellitus with diabetic chronic kidney disease: Secondary | ICD-10-CM | POA: Insufficient documentation

## 2021-05-29 DIAGNOSIS — Z87891 Personal history of nicotine dependence: Secondary | ICD-10-CM | POA: Diagnosis not present

## 2021-05-29 DIAGNOSIS — Z01810 Encounter for preprocedural cardiovascular examination: Secondary | ICD-10-CM

## 2021-05-29 DIAGNOSIS — I130 Hypertensive heart and chronic kidney disease with heart failure and stage 1 through stage 4 chronic kidney disease, or unspecified chronic kidney disease: Secondary | ICD-10-CM | POA: Diagnosis not present

## 2021-05-29 DIAGNOSIS — I428 Other cardiomyopathies: Secondary | ICD-10-CM | POA: Diagnosis not present

## 2021-05-29 DIAGNOSIS — I2510 Atherosclerotic heart disease of native coronary artery without angina pectoris: Secondary | ICD-10-CM | POA: Insufficient documentation

## 2021-05-29 DIAGNOSIS — Z79899 Other long term (current) drug therapy: Secondary | ICD-10-CM | POA: Insufficient documentation

## 2021-05-29 DIAGNOSIS — I447 Left bundle-branch block, unspecified: Secondary | ICD-10-CM | POA: Insufficient documentation

## 2021-05-29 DIAGNOSIS — I420 Dilated cardiomyopathy: Secondary | ICD-10-CM | POA: Diagnosis not present

## 2021-05-29 DIAGNOSIS — N2889 Other specified disorders of kidney and ureter: Secondary | ICD-10-CM

## 2021-05-29 DIAGNOSIS — Z7982 Long term (current) use of aspirin: Secondary | ICD-10-CM | POA: Diagnosis not present

## 2021-05-29 DIAGNOSIS — I5022 Chronic systolic (congestive) heart failure: Secondary | ICD-10-CM | POA: Diagnosis not present

## 2021-05-29 DIAGNOSIS — I080 Rheumatic disorders of both mitral and aortic valves: Secondary | ICD-10-CM | POA: Diagnosis not present

## 2021-05-29 DIAGNOSIS — Z9989 Dependence on other enabling machines and devices: Secondary | ICD-10-CM | POA: Diagnosis not present

## 2021-05-29 DIAGNOSIS — N1831 Chronic kidney disease, stage 3a: Secondary | ICD-10-CM | POA: Insufficient documentation

## 2021-05-29 DIAGNOSIS — I493 Ventricular premature depolarization: Secondary | ICD-10-CM | POA: Diagnosis not present

## 2021-05-29 DIAGNOSIS — Z7984 Long term (current) use of oral hypoglycemic drugs: Secondary | ICD-10-CM | POA: Diagnosis not present

## 2021-05-29 DIAGNOSIS — I509 Heart failure, unspecified: Secondary | ICD-10-CM

## 2021-05-29 LAB — COMPREHENSIVE METABOLIC PANEL
ALT: 27 U/L (ref 0–44)
AST: 32 U/L (ref 15–41)
Albumin: 4.1 g/dL (ref 3.5–5.0)
Alkaline Phosphatase: 40 U/L (ref 38–126)
Anion gap: 7 (ref 5–15)
BUN: 36 mg/dL — ABNORMAL HIGH (ref 8–23)
CO2: 26 mmol/L (ref 22–32)
Calcium: 9.9 mg/dL (ref 8.9–10.3)
Chloride: 105 mmol/L (ref 98–111)
Creatinine, Ser: 1.96 mg/dL — ABNORMAL HIGH (ref 0.61–1.24)
GFR, Estimated: 36 mL/min — ABNORMAL LOW (ref >60)
Glucose, Bld: 109 mg/dL — ABNORMAL HIGH (ref 70–99)
Potassium: 4.4 mmol/L (ref 3.5–5.1)
Sodium: 138 mmol/L (ref 135–145)
Total Bilirubin: 1 mg/dL (ref 0.3–1.2)
Total Protein: 7.3 g/dL (ref 6.5–8.1)

## 2021-05-29 LAB — ECHOCARDIOGRAM COMPLETE
Area-P 1/2: 3.53 cm2
Calc EF: 15.3 %
P 1/2 time: 593 msec
S' Lateral: 6.65 cm
Single Plane A2C EF: 13.8 %
Single Plane A4C EF: 19.3 %

## 2021-05-29 LAB — DIGOXIN LEVEL: Digoxin Level: 0.6 ng/mL — ABNORMAL LOW (ref 0.8–2.0)

## 2021-05-29 LAB — CBC
HCT: 35.4 % — ABNORMAL LOW (ref 39.0–52.0)
Hemoglobin: 12.2 g/dL — ABNORMAL LOW (ref 13.0–17.0)
MCH: 28.2 pg (ref 26.0–34.0)
MCHC: 34.5 g/dL (ref 30.0–36.0)
MCV: 81.8 fL (ref 80.0–100.0)
Platelets: 172 10*3/uL (ref 150–400)
RBC: 4.33 MIL/uL (ref 4.22–5.81)
RDW: 15.1 % (ref 11.5–15.5)
WBC: 4.9 10*3/uL (ref 4.0–10.5)
nRBC: 0 % (ref 0.0–0.2)

## 2021-05-29 LAB — BRAIN NATRIURETIC PEPTIDE: B Natriuretic Peptide: 514.7 pg/mL — ABNORMAL HIGH (ref 0.0–100.0)

## 2021-05-29 NOTE — Progress Notes (Signed)
  Echocardiogram 2D Echocardiogram has been performed.  Bobbye Charleston 05/29/2021, 1:54 PM

## 2021-05-29 NOTE — Patient Instructions (Signed)
There has been no changes to your medications. ? ?Labs done today, your results will be available in MyChart, we will contact you for abnormal readings. ? ? ?Your physician recommends that you schedule a follow-up appointment in: 3 months.   ? ?If you have any questions or concerns before your next appointment please send us a message through mychart or call our office at [phone].   ? ?TO LEAVE A MESSAGE FOR THE NURSE SELECT OPTION 2, PLEASE LEAVE A MESSAGE INCLUDING: ?YOUR NAME ?DATE OF BIRTH ?CALL BACK NUMBER ?REASON FOR CALL**this is important as we prioritize the call backs ? ?YOU WILL RECEIVE A CALL BACK THE SAME DAY AS LONG AS YOU CALL BEFORE 4:00 PM ? ?At the [facility], you and your health needs are our priority. As part of our continuing mission to provide you with exceptional heart care, we have created designated Provider Care Teams. These Care Teams include your primary Cardiologist (physician) and Advanced Practice Providers (APPs- Physician Assistants and Nurse Practitioners) who all work together to provide you with the care you need, when you need it.  ? ?You may see any of the following providers on your designated Care Team at your next follow up: ?Dr Daniel Bensimhon ?Dr Dalton McLean ?Amy Clegg, NP ?Brittainy Simmons, PA ?Jessica Milford,NP ?Lindsay Finch, PA ?Lauren Kemp, PharmD ? ? ?Please be sure to bring in all your medications bottles to every appointment.  ? ? ?

## 2021-05-29 NOTE — Progress Notes (Signed)
ADVANCED HF CLINIC CONSULT NOTE   Primary Care: [name], [name], MD HF Cardiologist: Dr. 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   Admitted 2/23 with new onset systolic heart failure. Echo EF 20-25% with anterior/apical WMA. Moderate MR. RV normal.Cath with minimal nonobstructive CAD.  cMRI LVEF 16%, RVEF 52%, LGE basal septal midwall (scar pattern seen with NICM). Cardiomyopathy felt to be likely due to LBBB +/- frequent PVCs . EP consulted -> need to wait 3 months for CRT. Discharged home with LifeVest, weight 219 lbs.  Readmitted in 03/20/21 with low output HF and transient shock. Treated with milrinone and improved. Repeat echo EF 20% . Milrinone weaned off. Seen by EP regarding CRT but recommended waiting 3 months for GDMT   Renal u/s found large right renal mass and CT confirmed RCC. Discharged home  Seen in Urology Clinic and felt to need relatively urgent right nephrectomy for large right RCC. Scheduled for laprascopic nephrectomy 06/12/21  Here for routine f/u. Feeling pretty good. Not exercising but is active with his errands without much difficulty. Can walk up a flight of steps without any difficulty. No CP, orthopnea or PND. No CP. No dizziness. Compliant with all meds.   Echo today 05/29/21 EF <20% LV markedly dilated. RV normal. Mild MR. Personally reviewed   Cardiac Studies: - Echo (2/23): EF 20-25% with anterior/apical WMA suggestive of previous anterior MI. Moderate MR. RV normal    - R/LHC (2/23):   Prox RCA lesion is 30% stenosed.   1st Diag lesion is 20% stenosed.   Prox Cx to Mid Cx lesion is 20% stenosed.  Ao = 102/70 (85) LV = 104/24 RA =  10 RV = 47/17 PA = 52/22 (33) PCW = 25 Fick cardiac output/index = 5.5/2.5 PVR =1.5 WU Ao sat = 98% PA sat = 65%, 66%   Assessment: 1. 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 Severe  NICM suspect due to LBBB 3. Elevated filling pressures with normal cardiac output   - cMRI (2/23): LVEF 16%, RVEF 52%, LGE basal septal midwall (scar pattern seen with NICM).     Past Medical History:  Diagnosis Date   DM (diabetes mellitus), type 2 ([facility])    OSA on CPAP    Current Outpatient Medications  Medication Sig Dispense Refill   acidophilus (RISAQUAD) CAPS capsule Take 1 capsule by mouth daily.     aspirin EC 81 MG tablet Take 81 mg by mouth daily. Swallow whole.     digoxin (LANOXIN) 0.125 MG tablet Take 1 tablet (0.125 mg total) by mouth daily. 90 tablet 3   docusate sodium (COLACE) 100 MG capsule Take 100 mg by mouth as needed for mild constipation.     empagliflozin (JARDIANCE) 10 MG TABS tablet Take 1 tablet (10 mg total) by mouth daily. 90 tablet 3   Multiple Vitamins-Minerals (MULTIVITAMIN WITH MINERALS) tablet Take 1 tablet by mouth daily.     Omega-3 Fatty Acids (FISH OIL) 1000 MG CAPS Take 1,000 mg by mouth daily.     potassium chloride (KLOR-CON M) 10 MEQ tablet Take 1 tablet (10 mEq total) by mouth daily. 30 tablet 3   PREVIDENT 5000 ENAMEL PROTECT 1.1-5 % GEL Place 1 application. onto teeth at bedtime.     rosuvastatin (CRESTOR) 40 MG tablet Take 1 tablet (40 mg total) by mouth daily. 90 tablet 3  sacubitril-valsartan (ENTRESTO) 24-26 MG Take 1 tablet by mouth 2 (two) times daily. 180 tablet 3   spironolactone (ALDACTONE) 25 MG tablet Take 1 tablet (25 mg total) by mouth daily. 90 tablet 3   torsemide (DEMADEX) 20 MG tablet Take 1 tablet (20 mg total) by mouth daily. 90 tablet 3   No current facility-administered medications for this encounter.   No Known Allergies  Social History   Socioeconomic History   Marital status: Married    Spouse name: Not on file   Number of children: Not on file   Years of education: Not on file   Highest education level: Not on file  Occupational History   Not on file  Tobacco Use   Smoking status: Former    Types: Pipe     Quit date: 02/05/1991    Years since quitting: 30.3   Smokeless tobacco: Not on file  Substance and Sexual Activity   Alcohol use: Not on file   Drug use: Not on file   Sexual activity: Not on file  Other Topics Concern   Not on file  Social History Narrative   Not on file   Social Determinants of Health   Financial Resource Strain: Low Risk    Difficulty of Paying Living Expenses: Not hard at all  Food Insecurity: No Food Insecurity   Worried About Running Out of Food in the Last Year: Never true   [place] in the Last Year: Never true  Transportation Needs: No Transportation Needs   Lack of Transportation (Medical): No   Lack of Transportation (Non-Medical): No  Physical Activity: Not on file  Stress: Not on file  Social Connections: Not on file  Intimate Partner Violence: Not on file   Family History  Problem Relation Age of Onset   Heart attack Father    Wt Readings from Last 3 Encounters:  05/29/21 97.6 kg (215 lb 3.2 oz)  05/25/21 98.1 kg (216 lb 4.8 oz)  05/19/21 98 kg (216 lb)   BP (!) 102/58   Pulse 72   Wt 97.6 kg (215 lb 3.2 oz)   SpO2 95%   BMI 30.88 kg/m   PHYSICAL EXAM: General:  Well appearing. No resp difficulty HEENT: normal Neck: supple. no JVD. Carotids 2+ bilat; no bruits. No lymphadenopathy or thryomegaly appreciated. Cor: PMI nondisplaced. Regular rate & rhythm. No rubs, gallops or murmurs. Lungs: clear Abdomen: obese, soft, nontender, nondistended. No hepatosplenomegaly. No bruits or masses. Good bowel sounds. Extremities: no cyanosis, clubbing, rash, edema Neuro: alert & orientedx3, cranial nerves grossly intact. moves all 4 extremities w/o difficulty. Affect pleasant   ASSESSMENT & PLAN:  1. Chronic systolic HF - Echo 7/74 EF 20-25% with anterior/apical WMA - R/LHC: Minimal non-obstructive CAD with separate left coronary ostia, severe NICM suspect due to LBBB, and elevated filling pressures with normal cardiac output.  -  cMRI: LVEF 12%, RV systolic function  87%, basal septal midwall late gadolinium enhancement (scar pattern seen in NICM and associated with worse prognosis), RV insertion LGE  - Readmit ADHF/shock 3/23. Echo EF 20% Briefly required milrinone. Seen by EP again -> wait 3 months for CRT-D - Echo today 05/29/21 EF <20% LV markedly dilated. RV normal. Mild MR. Personally reviewed - Improved NYHA II - Volume status ok on torsemide 20 daily  - BP soft but stable - Continue torsemide '20mg'$  daily  - Continue Jardiance 10 mg daily.  - Continue spiro 25 mg daily.  - Continue Entresto 24/26 mg  bid. - Continue digoxin 0.125 - Check labs today including digoxin level   2. PVCs/NSVT - He has decided not to wear LifeVest - Check ECG today -> no PVCs   3. DM2 - A1c 5.4% - On SGLT2i.   4. CKD 3a - Continue Jardiance. - BMET today.   5. OSA - Continue CPAP. - Follows with Dr. Maxwell Caul at [place].   6. Minimal non-obstructive CAD - On ASA/statin. - No s/s angina - LDL 74.   7. LBBB  - EF remains depressed despite maximal GDMT - Will refer back to EP for CRT-D  8. Large right renal cell CA - Seen in Urology Clinic by Dr. Lovena Neighbours and felt to need relatively urgent right nephrectomy for large right RCC - Discussed situation in detail. With severe HF and recent cardiogenic shock with persistent, severe LV dysfunction he would be at very high risk for peri-operative cardiac complications and possibly death. I explained to him Urology's concern for resection ASAP to limit possibility of metastatic spread. -I remain concerned about his operative risk but I think we have him as optimized as possible.    Glori Bickers, MD  2:37 PM

## 2021-06-02 ENCOUNTER — Encounter (HOSPITAL_COMMUNITY): Payer: Medicare PPO

## 2021-06-11 ENCOUNTER — Observation Stay (HOSPITAL_COMMUNITY)
Admission: RE | Admit: 2021-06-11 | Discharge: 2021-06-12 | Disposition: A | Discharge status: Home / Self Care | Payer: Medicare PPO | Attending: Urology | Admitting: Urology

## 2021-06-11 DIAGNOSIS — I5022 Chronic systolic (congestive) heart failure: Secondary | ICD-10-CM | POA: Diagnosis not present

## 2021-06-11 DIAGNOSIS — Z87891 Personal history of nicotine dependence: Secondary | ICD-10-CM | POA: Diagnosis not present

## 2021-06-11 DIAGNOSIS — I110 Hypertensive heart disease with heart failure: Secondary | ICD-10-CM | POA: Diagnosis not present

## 2021-06-11 DIAGNOSIS — Z7984 Long term (current) use of oral hypoglycemic drugs: Secondary | ICD-10-CM | POA: Insufficient documentation

## 2021-06-11 DIAGNOSIS — Z79899 Other long term (current) drug therapy: Secondary | ICD-10-CM | POA: Diagnosis not present

## 2021-06-11 DIAGNOSIS — N2889 Other specified disorders of kidney and ureter: Secondary | ICD-10-CM | POA: Diagnosis not present

## 2021-06-11 DIAGNOSIS — Z538 Procedure and treatment not carried out for other reasons: Secondary | ICD-10-CM | POA: Diagnosis not present

## 2021-06-11 DIAGNOSIS — G4730 Sleep apnea, unspecified: Secondary | ICD-10-CM | POA: Diagnosis present

## 2021-06-11 DIAGNOSIS — Z7982 Long term (current) use of aspirin: Secondary | ICD-10-CM | POA: Insufficient documentation

## 2021-06-11 DIAGNOSIS — I447 Left bundle-branch block, unspecified: Secondary | ICD-10-CM | POA: Diagnosis not present

## 2021-06-11 DIAGNOSIS — Z01818 Encounter for other preprocedural examination: Secondary | ICD-10-CM | POA: Insufficient documentation

## 2021-06-11 DIAGNOSIS — D49511 Neoplasm of unspecified behavior of right kidney: Secondary | ICD-10-CM | POA: Insufficient documentation

## 2021-06-11 DIAGNOSIS — E119 Type 2 diabetes mellitus without complications: Secondary | ICD-10-CM | POA: Insufficient documentation

## 2021-06-11 LAB — CBC WITH DIFFERENTIAL/PLATELET
Abs Immature Granulocytes: 0.01 10*3/uL (ref 0.00–0.07)
Basophils Absolute: 0 10*3/uL (ref 0.0–0.1)
Basophils Relative: 1 %
Eosinophils Absolute: 0.1 10*3/uL (ref 0.0–0.5)
Eosinophils Relative: 2 %
HCT: 34.8 % — ABNORMAL LOW (ref 39.0–52.0)
Hemoglobin: 12.4 g/dL — ABNORMAL LOW (ref 13.0–17.0)
Immature Granulocytes: 0 %
Lymphocytes Relative: 23 %
Lymphs Abs: 1.1 10*3/uL (ref 0.7–4.0)
MCH: 29.2 pg (ref 26.0–34.0)
MCHC: 35.6 g/dL (ref 30.0–36.0)
MCV: 81.9 fL (ref 80.0–100.0)
Monocytes Absolute: 0.6 10*3/uL (ref 0.1–1.0)
Monocytes Relative: 12 %
Neutro Abs: 3.1 10*3/uL (ref 1.7–7.7)
Neutrophils Relative %: 62 %
Platelets: 162 10*3/uL (ref 150–400)
RBC: 4.25 MIL/uL (ref 4.22–5.81)
RDW: 15.3 % (ref 11.5–15.5)
WBC: 4.9 10*3/uL (ref 4.0–10.5)
nRBC: 0 % (ref 0.0–0.2)

## 2021-06-11 LAB — BASIC METABOLIC PANEL
Anion gap: 8 (ref 5–15)
BUN: 33 mg/dL — ABNORMAL HIGH (ref 8–23)
CO2: 24 mmol/L (ref 22–32)
Calcium: 10.1 mg/dL (ref 8.9–10.3)
Chloride: 105 mmol/L (ref 98–111)
Creatinine, Ser: 1.68 mg/dL — ABNORMAL HIGH (ref 0.61–1.24)
GFR, Estimated: 43 mL/min — ABNORMAL LOW (ref >60)
Glucose, Bld: 134 mg/dL — ABNORMAL HIGH (ref 70–99)
Potassium: 4 mmol/L (ref 3.5–5.1)
Sodium: 137 mmol/L (ref 135–145)

## 2021-06-11 LAB — TYPE AND SCREEN
ABO/RH(D): O POS
Antibody Screen: NEGATIVE

## 2021-06-11 LAB — GLUCOSE, CAPILLARY: Glucose-Capillary: 102 mg/dL — ABNORMAL HIGH (ref 70–99)

## 2021-06-11 LAB — ABO/RH: ABO/RH(D): O POS

## 2021-06-11 MED ORDER — SPIRONOLACTONE 25 MG PO TABS
25.0000 mg | ORAL_TABLET | Freq: Every day | ORAL | Status: DC
  Administered 2021-06-12: 25 mg via ORAL
  Filled 2021-06-11: qty 1

## 2021-06-11 MED ORDER — DIGOXIN 125 MCG PO TABS
0.1250 mg | ORAL_TABLET | Freq: Every day | ORAL | Status: DC
  Administered 2021-06-12: 0.125 mg via ORAL
  Filled 2021-06-11: qty 1

## 2021-06-11 MED ORDER — CEFAZOLIN SODIUM-DEXTROSE 2-4 GM/100ML-% IV SOLN: 2.0000 g | Freq: Once | INTRAVENOUS | Status: DC | PRN

## 2021-06-11 MED ORDER — ROSUVASTATIN CALCIUM 20 MG PO TABS
40.0000 mg | ORAL_TABLET | Freq: Every day | ORAL | Status: DC
  Administered 2021-06-11 – 2021-06-12 (×2): 40 mg via ORAL
  Filled 2021-06-11 (×2): qty 2

## 2021-06-11 MED ORDER — INSULIN ASPART 100 UNIT/ML IJ SOLN: 0.0000 [IU] | Freq: Every day | INTRAMUSCULAR | Status: DC

## 2021-06-11 MED ORDER — TORSEMIDE 20 MG PO TABS
20.0000 mg | ORAL_TABLET | Freq: Every day | ORAL | Status: DC
  Filled 2021-06-11: qty 1

## 2021-06-11 MED ORDER — INSULIN ASPART 100 UNIT/ML IJ SOLN: 0.0000 [IU] | Freq: Three times a day (TID) | INTRAMUSCULAR | Status: DC

## 2021-06-11 MED ORDER — SACUBITRIL-VALSARTAN 24-26 MG PO TABS
1.0000 | ORAL_TABLET | Freq: Two times a day (BID) | ORAL | Status: DC
  Administered 2021-06-12: 1 via ORAL
  Filled 2021-06-11: qty 1

## 2021-06-11 NOTE — H&P (Signed)
PRE-OP H&P  Office Visit Report     05/28/2021   --------------------------------------------------------------------------------   Bryce Cain  MRN: [id number]  DOB: 03-Feb-1950, 71 year old Male  SSN:    PRIMARY CARE:    REFERRING:  Doniven Vanpatten A. Lovena Neighbours, MD  PROVIDER:  Ellison Hughs, M.D.  TREATING:  Jiles Crocker, NP  LOCATION:  [facility] [phone]     --------------------------------------------------------------------------------   CC/HPI: Right renal mass   Mr. Bryce Cain is a 71 year old male with a solid enhancing 10.2 cm right renal mass with features concerning for renal cell carcinoma. He has a significant past medical history of congestive heart failure with a left ventricular ejection fraction of 16% as well as a left bundle branch block. Currently followed by Dr. Haroldine Laws. His right renal mass was incidentally identified on 03/20/2021 during evaluation for possible pulmonary embolism. Chest imaging showed no evidence of metastatic disease. He is a retired Gaffer at [employer]. He previously smoked a pipe for several years approximately 4 years ago. He notes no significant personal or family history of GU malignancies. No prior abdominal surgeries.   -Renal function: Serum creatinine- 1.3, eGFR- 58  -Normal left kidney    05/25/2021: Patient here for preoperative appointment prior to undergoing right robotic nephrectomy with Dr. Lovena Neighbours on 6/9. Due to complex medical comorbidities including congestive heart failure, patient is being admitted on 6/8 at the recommendation of his cardiologist. Denies any changes in past medical history, prescription medications taken on daily basis. No interval surgical or procedural intervention. He did have a GI illness with diarrhea for about 4 days a month ago but has since recovered from this. He has had no interval infection treatment. Voiding at his baseline with stable, grossly nonbothersome symptomology. He  denies any dysuria or gross hematuria. He is not having any pain/discomfort suggestive of obstructive uropathy. Currently without chest pain, shortness of breath or significant dyspnea upon exertion. Denies recent fevers or chills, nausea/vomiting.     ALLERGIES: None   MEDICATIONS: Acidophilus  Aspirin Ec 81 mg tablet, delayed release  Colace  Digoxin  Entresto 24 mg-26 mg tablet  Jardiance 10 mg tablet  Multivitamins  Omega 3  Potassium Chloride  Prevident 5000 Dry Mouth  Rosuvastatin Calcium 40 mg tablet  Spironolactone 25 mg tablet  Torsemide 20 mg tablet     GU PSH: No GU PSH      PSH Notes: stent (cardiac) x 2   NON-GU PSH: No Non-GU PSH    GU PMH: Chronic kidney disease stage 3 (GFR 30-60) - 03/30/2021, - 03/30/2021 Right renal neoplasm - 03/30/2021, - 03/30/2021    NON-GU PMH: Arrhythmia Chronic systolic (congestive) heart failure Diabetes Type 2 Hypercholesterolemia Hypertension Left bundle-branch block, unspecified Other sleep apnea    FAMILY HISTORY: No Family History    SOCIAL HISTORY: Marital Status: Married Preferred Language: English; Ethnicity: Not Hispanic Or Latino; Race: White Current Smoking Status: Patient does not smoke anymore.   Tobacco Use Assessment Completed: Used Tobacco in last 30 days? Has never drank.  Drinks 2 caffeinated drinks per day.    REVIEW OF SYSTEMS:    GU Review Male:   Patient reports frequent urination and get up at night to urinate. Patient denies hard to postpone urination, burning/ pain with urination, leakage of urine, stream starts and stops, trouble starting your stream, have to strain to urinate , erection problems, and penile pain.  Gastrointestinal (Upper):   Patient denies nausea, vomiting, and indigestion/ heartburn.  Gastrointestinal (Lower):  Patient denies diarrhea and constipation.  Constitutional:   Patient denies fever, night sweats, weight loss, and fatigue.  Skin:   Patient denies skin rash/ lesion and  itching.  Eyes:   Patient denies blurred vision and double vision.  Ears/ Nose/ Throat:   Patient denies sore throat and sinus problems.  Hematologic/Lymphatic:   Patient denies swollen glands and easy bruising.  Cardiovascular:   Patient reports leg swelling. Patient denies chest pains.  Respiratory:   Patient denies cough and shortness of breath.  Endocrine:   Patient denies excessive thirst.  Musculoskeletal:   Patient denies back pain and joint pain.  Neurological:   Patient denies dizziness and headaches.  Psychologic:   Patient denies depression and anxiety.   VITAL SIGNS:      05/28/2021 02:21 PM  BP 117/73 mmHg  Pulse 76 /min  Temperature 97.1 F / 36.1 C   MULTI-SYSTEM PHYSICAL EXAMINATION:    Constitutional: Well-nourished. No physical deformities. Normally developed. Good grooming.  Neck: Neck symmetrical, not swollen. Normal tracheal position.  Respiratory: No labored breathing, no use of accessory muscles.   Cardiovascular: Normal temperature, normal extremity pulses, no swelling, no varicosities. AICD in left upper chest.  Skin: No paleness, no jaundice, no cyanosis. No lesion, no ulcer, no rash.  Neurologic / Psychiatric: Oriented to time, oriented to place, oriented to person. No depression, no anxiety, no agitation.  Gastrointestinal: Obese abdomen. No mass, no tenderness, no rigidity.   Musculoskeletal: Normal gait and station of head and neck.     Complexity of Data:  Source Of History:  Patient, Family/Caregiver, Medical Record Summary  Records Review:   Previous Doctor Records, Previous Hospital Records, Previous Patient Records  Urine Test Review:   Urinalysis  X-Ray Review: C.T. Abdomen/Pelvis: Reviewed Films. Reviewed Report.  MRI Abdomen: Reviewed Films. Reviewed Report.     05/28/21  Urinalysis  Urine Appearance Clear   Urine Color Straw   Urine Glucose 1+ mg/dL  Urine Bilirubin Neg mg/dL  Urine Ketones Neg mg/dL  Urine Specific Gravity 1.010    Urine Blood Neg ery/uL  Urine pH 5.5   Urine Protein Neg mg/dL  Urine Urobilinogen 0.2 mg/dL  Urine Nitrites Neg   Urine Leukocyte Esterase Neg leu/uL   PROCEDURES:          Urinalysis Dipstick Dipstick Cont'd  Color: Straw Bilirubin: Neg mg/dL  Appearance: Clear Ketones: Neg mg/dL  Specific Gravity: 1.010 Blood: Neg ery/uL  pH: 5.5 Protein: Neg mg/dL  Glucose: 1+ mg/dL Urobilinogen: 0.2 mg/dL    Nitrites: Neg    Leukocyte Esterase: Neg leu/uL    ASSESSMENT:      ICD-10 Details  1 GU:   Right renal neoplasm - D49.511 Chronic, Threat to Bodily Function  2 NON-GU:   Encounter for other preprocedural examination - Z01.818 Undiagnosed New Problem   PLAN:           Orders Labs Urine Culture          Schedule Return Visit/Planned Activity: Keep Scheduled Appointment - Follow up MD, Schedule Surgery          Document Letter(s):  Created for Patient: Clinical Summary         Notes:  I reviewed imaging results and films with the patient personally. We discussed that the mass in question has features concerning for malignancy. I explained the natural history of presumed renal cell carcinoma. I reviewed the AUA guidelines for evaluation and treatment of renal masses. The options of active surveillance, in  situ tumor ablation, partial and radical nephrectomy was discussed. The risks of robot-assisted laparoscopic RIGHT radical nephrectomy were discussed in detail including but not limited to: negative pathology, open conversion, infection of the skin/abdominal cavity, VTE, MI/CVA, lymphatic leak, injury to adjacent solid/hollow viscus organs, bleeding requiring a blood transfusion, catastrophic bleeding, hernia formation and other imponderables. The patient voices understanding and wishes to proceed.

## 2021-06-12 ENCOUNTER — Inpatient Hospital Stay (HOSPITAL_COMMUNITY): Payer: Medicare PPO | Admitting: Certified Registered"

## 2021-06-12 ENCOUNTER — Other Ambulatory Visit: Payer: Self-pay | Admitting: Urology

## 2021-06-12 ENCOUNTER — Encounter (HOSPITAL_COMMUNITY): Payer: Self-pay | Admitting: Urology

## 2021-06-12 ENCOUNTER — Encounter (HOSPITAL_COMMUNITY)
Admission: RE | Disposition: A | Discharge status: Home / Self Care | Payer: Self-pay | Source: Home / Self Care | Attending: Urology

## 2021-06-12 ENCOUNTER — Other Ambulatory Visit: Payer: Self-pay

## 2021-06-12 DIAGNOSIS — I5022 Chronic systolic (congestive) heart failure: Secondary | ICD-10-CM | POA: Diagnosis not present

## 2021-06-12 DIAGNOSIS — Z7982 Long term (current) use of aspirin: Secondary | ICD-10-CM | POA: Diagnosis not present

## 2021-06-12 DIAGNOSIS — I447 Left bundle-branch block, unspecified: Secondary | ICD-10-CM | POA: Diagnosis not present

## 2021-06-12 DIAGNOSIS — Z79899 Other long term (current) drug therapy: Secondary | ICD-10-CM | POA: Diagnosis not present

## 2021-06-12 DIAGNOSIS — G4730 Sleep apnea, unspecified: Secondary | ICD-10-CM | POA: Diagnosis not present

## 2021-06-12 DIAGNOSIS — Z87891 Personal history of nicotine dependence: Secondary | ICD-10-CM | POA: Diagnosis not present

## 2021-06-12 DIAGNOSIS — N2889 Other specified disorders of kidney and ureter: Secondary | ICD-10-CM | POA: Diagnosis not present

## 2021-06-12 DIAGNOSIS — Z7984 Long term (current) use of oral hypoglycemic drugs: Secondary | ICD-10-CM | POA: Diagnosis not present

## 2021-06-12 DIAGNOSIS — I110 Hypertensive heart disease with heart failure: Secondary | ICD-10-CM | POA: Diagnosis not present

## 2021-06-12 LAB — GLUCOSE, CAPILLARY: Glucose-Capillary: 108 mg/dL — ABNORMAL HIGH (ref 70–99)

## 2021-06-12 SURGERY — NEPHRECTOMY, RADICAL, ROBOT-ASSISTED, LAPAROSCOPIC, ADULT
Anesthesia: General | Laterality: Right

## 2021-06-12 MED ORDER — CEFAZOLIN SODIUM-DEXTROSE 2-4 GM/100ML-% IV SOLN
2.0000 g | Freq: Once | INTRAVENOUS | Status: DC
  Filled 2021-06-12: qty 100

## 2021-06-12 MED ORDER — LACTATED RINGERS IV SOLN: INTRAVENOUS | Status: DC

## 2021-06-12 MED ORDER — FENTANYL CITRATE (PF) 100 MCG/2ML IJ SOLN
INTRAMUSCULAR | Status: AC
  Filled 2021-06-12: qty 2

## 2021-06-12 MED ORDER — ROCURONIUM BROMIDE 10 MG/ML (PF) SYRINGE
PREFILLED_SYRINGE | INTRAVENOUS | Status: AC
  Filled 2021-06-12: qty 20

## 2021-06-12 MED ORDER — MIDAZOLAM HCL 2 MG/2ML IJ SOLN
INTRAMUSCULAR | Status: AC
  Filled 2021-06-12: qty 2

## 2021-06-12 MED ORDER — CHLORHEXIDINE GLUCONATE 0.12 % MT SOLN
15.0000 mL | Freq: Once | OROMUCOSAL | Status: AC
  Administered 2021-06-12: 15 mL via OROMUCOSAL

## 2021-06-12 MED ORDER — LIDOCAINE HCL (PF) 2 % IJ SOLN
INTRAMUSCULAR | Status: AC
  Filled 2021-06-12: qty 15

## 2021-06-12 MED ORDER — DEXAMETHASONE SODIUM PHOSPHATE 10 MG/ML IJ SOLN
INTRAMUSCULAR | Status: AC
  Filled 2021-06-12: qty 2

## 2021-06-12 MED ORDER — EPINEPHRINE PF 1 MG/ML IJ SOLN
INTRAMUSCULAR | Status: AC
  Filled 2021-06-12: qty 1

## 2021-06-12 MED ORDER — GLYCOPYRROLATE 0.2 MG/ML IJ SOLN
INTRAMUSCULAR | Status: AC
  Filled 2021-06-12: qty 1

## 2021-06-12 MED ORDER — ONDANSETRON HCL 4 MG/2ML IJ SOLN
INTRAMUSCULAR | Status: AC
  Filled 2021-06-12: qty 4

## 2021-06-12 SURGICAL SUPPLY — 59 items
BAG COUNTER SPONGE SURGICOUNT (BAG) IMPLANT
BAG LAPAROSCOPIC 12 15 PORT 16 (BASKET) ×1 IMPLANT
BAG RETRIEVAL 12/15 (BASKET) ×2
CHLORAPREP W/TINT 26 (MISCELLANEOUS) ×2 IMPLANT
CLIP LIGATING HEM O LOK PURPLE (MISCELLANEOUS) ×2 IMPLANT
CLIP LIGATING HEMO LOK XL GOLD (MISCELLANEOUS) ×2 IMPLANT
CLIP LIGATING HEMO O LOK GREEN (MISCELLANEOUS) IMPLANT
COVER SURGICAL LIGHT HANDLE (MISCELLANEOUS) ×2 IMPLANT
COVER TIP SHEARS 8 DVNC (MISCELLANEOUS) ×1 IMPLANT
COVER TIP SHEARS 8MM DA VINCI (MISCELLANEOUS) ×2
CUTTER ECHEON FLEX ENDO 45 340 (ENDOMECHANICALS) IMPLANT
DERMABOND ADVANCED (GAUZE/BANDAGES/DRESSINGS) ×2
DERMABOND ADVANCED .7 DNX12 (GAUZE/BANDAGES/DRESSINGS) ×1 IMPLANT
DRAPE ARM DVNC X/XI (DISPOSABLE) ×4 IMPLANT
DRAPE COLUMN DVNC XI (DISPOSABLE) ×1 IMPLANT
DRAPE DA VINCI XI ARM (DISPOSABLE) ×8
DRAPE DA VINCI XI COLUMN (DISPOSABLE) ×2
DRAPE INCISE IOBAN 66X45 STRL (DRAPES) ×2 IMPLANT
DRAPE SHEET LG 3/4 BI-LAMINATE (DRAPES) ×2 IMPLANT
ELECT PENCIL ROCKER SW 15FT (MISCELLANEOUS) ×2 IMPLANT
ELECT REM PT RETURN 15FT ADLT (MISCELLANEOUS) ×2 IMPLANT
GAUZE 4X4 16PLY ~~LOC~~+RFID DBL (SPONGE) ×2 IMPLANT
GLOVE BIO SURGEON STRL SZ 6.5 (GLOVE) ×2 IMPLANT
GLOVE BIOGEL PI IND STRL 8 (GLOVE) ×1 IMPLANT
GLOVE BIOGEL PI INDICATOR 8 (GLOVE) ×2
GLOVE SURG LX 7.5 STRW (GLOVE) ×4
GLOVE SURG LX STRL 7.5 STRW (GLOVE) ×2 IMPLANT
GOWN SRG XL LVL 4 BRTHBL STRL (GOWNS) ×1 IMPLANT
GOWN STRL NON-REIN XL LVL4 (GOWNS) ×2
GOWN STRL REUS W/ TWL LRG LVL3 (GOWN DISPOSABLE) ×1 IMPLANT
GOWN STRL REUS W/ TWL XL LVL3 (GOWN DISPOSABLE) ×2 IMPLANT
GOWN STRL REUS W/TWL LRG LVL3 (GOWN DISPOSABLE) ×2
GOWN STRL REUS W/TWL XL LVL3 (GOWN DISPOSABLE) ×4
HOLDER FOLEY CATH W/STRAP (MISCELLANEOUS) ×2 IMPLANT
IRRIG SUCT STRYKERFLOW 2 WTIP (MISCELLANEOUS) ×2
IRRIGATION SUCT STRKRFLW 2 WTP (MISCELLANEOUS) ×1 IMPLANT
KIT BASIN OR (CUSTOM PROCEDURE TRAY) ×2 IMPLANT
KIT TURNOVER KIT A (KITS) IMPLANT
MARKER SKIN DUAL TIP RULER LAB (MISCELLANEOUS) ×2 IMPLANT
NEEDLE INSUFFLATION 14GA 120MM (NEEDLE) ×2 IMPLANT
PROTECTOR NERVE ULNAR (MISCELLANEOUS) ×4 IMPLANT
SCISSORS LAP 5X45 EPIX DISP (ENDOMECHANICALS) ×2 IMPLANT
SEAL CANN UNIV 5-8 DVNC XI (MISCELLANEOUS) ×3 IMPLANT
SEAL XI 5MM-8MM UNIVERSAL (MISCELLANEOUS) ×6
SET TUBE SMOKE EVAC HIGH FLOW (TUBING) ×2 IMPLANT
SOLUTION ELECTROLUBE (MISCELLANEOUS) ×2 IMPLANT
SPIKE FLUID TRANSFER (MISCELLANEOUS) ×2 IMPLANT
STAPLE RELOAD 45 WHT (STAPLE) IMPLANT
STAPLE RELOAD 45MM WHITE (STAPLE)
SUT MNCRL AB 4-0 PS2 18 (SUTURE) ×4 IMPLANT
SUT PDS AB 0 CT1 36 (SUTURE) ×4 IMPLANT
SUT VICRYL 0 UR6 27IN ABS (SUTURE) IMPLANT
TOWEL OR 17X26 10 PK STRL BLUE (TOWEL DISPOSABLE) ×2 IMPLANT
TOWEL OR NON WOVEN STRL DISP B (DISPOSABLE) ×2 IMPLANT
TRAY FOLEY MTR SLVR 16FR STAT (SET/KITS/TRAYS/PACK) ×2 IMPLANT
TRAY LAPAROSCOPIC (CUSTOM PROCEDURE TRAY) ×2 IMPLANT
TROCAR KII 12X100 BLADELESS (ENDOMECHANICALS) ×2 IMPLANT
TROCAR Z-THREAD OPTICAL 5X100M (TROCAR) IMPLANT
WATER STERILE IRR 1000ML POUR (IV SOLUTION) ×2 IMPLANT

## 2021-06-12 NOTE — Anesthesia Preprocedure Evaluation (Signed)
Anesthesia Evaluation    Airway        Dental   Pulmonary sleep apnea and Continuous Positive Airway Pressure Ventilation , former smoker,           Cardiovascular hypertension, Pt. on medications +CHF  + dysrhythmias      Neuro/Psych negative neurological ROS  negative psych ROS   GI/Hepatic negative GI ROS, Neg liver ROS,   Endo/Other  diabetes, Type 2, Insulin Dependent  Renal/GU Renal diseaseRenal mass  negative genitourinary   Musculoskeletal negative musculoskeletal ROS (+)   Abdominal   Peds  Hematology Lab Results      Component                Value               Date                      WBC                      4.9                 06/11/2021                HGB                      12.4 (L)            06/11/2021                HCT                      34.8 (L)            06/11/2021                MCV                      81.9                06/11/2021                PLT                      162                 06/11/2021           Lab Results      Component                Value               Date                      NA                       137                 06/11/2021                K                        4.0                 06/11/2021                CO2  24                  06/11/2021                GLUCOSE                  134 (H)             06/11/2021                BUN                      33 (H)              06/11/2021                CREATININE               1.68 (H)            06/11/2021                CALCIUM                  10.1                06/11/2021                GFRNONAA                 43 (L)              06/11/2021             Anesthesia Other Findings   Reproductive/Obstetrics                             Anesthesia Physical Anesthesia Plan  ASA: 4  Anesthesia Plan: General   Post-op Pain Management:    Induction:  Intravenous  PONV Risk Score and Plan: 2 and Ondansetron, Dexamethasone, Treatment may vary due to age or medical condition and Midazolam  Airway Management Planned: Mask and Oral ETT  Additional Equipment: Arterial line  Intra-op Plan:   Post-operative Plan: Possible Post-op intubation/ventilation  Informed Consent:   Plan Discussed with:   Anesthesia Plan Comments: (ECHO 05/23: 1. Left ventricular ejection fraction, by estimation, is <20%. The left  ventricle has severely decreased function. The left ventricle demonstrates  global hypokinesis. The left ventricular internal cavity size was severely  dilated. There is moderate  concentric left ventricular hypertrophy. Left ventricular diastolic  parameters are consistent with Grade I diastolic dysfunction (impaired  relaxation).  2. Right ventricular systolic function is normal. The right ventricular  size is normal.  3. Left atrial size was mildly dilated.  4. The mitral valve is normal in structure. Mild mitral valve  regurgitation. No evidence of mitral stenosis.  5. The aortic valve is tricuspid. There is mild calcification of the  aortic valve. Aortic valve regurgitation is mild. No aortic stenosis is  present.  6. The inferior vena cava is normal in size with greater than 50%  respiratory variability, suggesting right atrial pressure of 3 mmHg.   Cardiac MRI 02/23: IMPRESSION: 1. Severe LV dilatation (LVEDVI 237 mL/m^2) with severe systolic dysfunction (EF 16%). Septal dyskinesis consistent with LBBB 2.  Normal RV size and systolic function (EF 10%) 3. Basal septal midwall late gadolinium enhancement, which is a scar pattern seen in nonischemic cardiomyopathies and associated with a worse prognosis 4. RV insertion site LGE,  which is a nonspecific scar pattern often seen in setting of elevated pulmonary pressures)        Anesthesia Quick Evaluation

## 2021-06-12 NOTE — Progress Notes (Signed)
Spoke with urology MD, due to OR delay, surgery will be cancelled today and patient being discharged. Please paged cardmaster via Amion if you need [place] to be involved in his care when he returns later for surgery. Thank you.

## 2021-06-12 NOTE — Progress Notes (Signed)
Patients surgery being canceled.  Patient will return to the inpatient unit, room 1405 to be discharged.  Discharge orders are in epic.  Spoke to nurse on [place], and informed her that patients surgery was canceled and he would be returning to floor.  She voiced understanding.  A-line was started by anesthesia while in short stay.  Dr. Melanie Crazier gave order to d/c a-line before patient returns to floor.  A-Line to right wrist was d/c, pressure was held for several minutes and pressure dressing was applied.  Catheter tip intact.  Confirmed site had stopped bleeding and a new clean dressing was applied.

## 2021-06-12 NOTE — Progress Notes (Signed)
Due to OR delay and staffing shortage, I am cancelling Bryce Cain's right nephrectomy.  I will work with my staff to get his surgery rescheduled as soon as possible.

## 2021-06-12 NOTE — Progress Notes (Signed)
  Transition of Care [facility]) Screening Note   Patient Details  Name: Bryce Cain Date of Birth: 03-Jul-1950   Transition of Care [facility] CM/SW Contact:    Dessa Phi, RN Phone Number: 06/12/2021, 2:26 PM    Transition of Care Department [facility]) has reviewed patient and no TOC needs have been identified at this time. We will continue to monitor patient advancement through interdisciplinary progression rounds. If new patient transition needs arise, please place a TOC consult.

## 2021-06-19 ENCOUNTER — Encounter (HOSPITAL_COMMUNITY): Admit: 2021-06-19 | Payer: Medicare PPO

## 2021-06-22 ENCOUNTER — Inpatient Hospital Stay (HOSPITAL_COMMUNITY)
Admission: RE | Admit: 2021-06-22 | Discharge: 2021-06-26 | DRG: 657 | Disposition: A | Discharge status: Home / Self Care | Payer: Medicare PPO | Attending: Urology | Admitting: Urology

## 2021-06-22 DIAGNOSIS — I509 Heart failure, unspecified: Secondary | ICD-10-CM | POA: Diagnosis not present

## 2021-06-22 DIAGNOSIS — I959 Hypotension, unspecified: Secondary | ICD-10-CM | POA: Diagnosis not present

## 2021-06-22 DIAGNOSIS — E669 Obesity, unspecified: Secondary | ICD-10-CM | POA: Diagnosis present

## 2021-06-22 DIAGNOSIS — Z7984 Long term (current) use of oral hypoglycemic drugs: Secondary | ICD-10-CM

## 2021-06-22 DIAGNOSIS — I493 Ventricular premature depolarization: Secondary | ICD-10-CM | POA: Diagnosis not present

## 2021-06-22 DIAGNOSIS — Z87891 Personal history of nicotine dependence: Secondary | ICD-10-CM | POA: Diagnosis not present

## 2021-06-22 DIAGNOSIS — Z6830 Body mass index (BMI) 30.0-30.9, adult: Secondary | ICD-10-CM | POA: Diagnosis not present

## 2021-06-22 DIAGNOSIS — R109 Unspecified abdominal pain: Secondary | ICD-10-CM | POA: Diagnosis not present

## 2021-06-22 DIAGNOSIS — Z7982 Long term (current) use of aspirin: Secondary | ICD-10-CM

## 2021-06-22 DIAGNOSIS — G4733 Obstructive sleep apnea (adult) (pediatric): Secondary | ICD-10-CM | POA: Diagnosis not present

## 2021-06-22 DIAGNOSIS — E119 Type 2 diabetes mellitus without complications: Secondary | ICD-10-CM | POA: Diagnosis present

## 2021-06-22 DIAGNOSIS — I2510 Atherosclerotic heart disease of native coronary artery without angina pectoris: Secondary | ICD-10-CM | POA: Diagnosis present

## 2021-06-22 DIAGNOSIS — I472 Ventricular tachycardia, unspecified: Secondary | ICD-10-CM | POA: Diagnosis not present

## 2021-06-22 DIAGNOSIS — C641 Malignant neoplasm of right kidney, except renal pelvis: Principal | ICD-10-CM | POA: Diagnosis present

## 2021-06-22 DIAGNOSIS — I447 Left bundle-branch block, unspecified: Secondary | ICD-10-CM | POA: Diagnosis present

## 2021-06-22 DIAGNOSIS — N2889 Other specified disorders of kidney and ureter: Secondary | ICD-10-CM | POA: Diagnosis not present

## 2021-06-22 DIAGNOSIS — Z79899 Other long term (current) drug therapy: Secondary | ICD-10-CM

## 2021-06-22 DIAGNOSIS — I428 Other cardiomyopathies: Secondary | ICD-10-CM | POA: Diagnosis present

## 2021-06-22 DIAGNOSIS — D49511 Neoplasm of unspecified behavior of right kidney: Secondary | ICD-10-CM | POA: Diagnosis present

## 2021-06-22 DIAGNOSIS — I110 Hypertensive heart disease with heart failure: Secondary | ICD-10-CM | POA: Diagnosis present

## 2021-06-22 DIAGNOSIS — Z8249 Family history of ischemic heart disease and other diseases of the circulatory system: Secondary | ICD-10-CM

## 2021-06-22 DIAGNOSIS — I5022 Chronic systolic (congestive) heart failure: Secondary | ICD-10-CM | POA: Diagnosis not present

## 2021-06-22 DIAGNOSIS — Z9989 Dependence on other enabling machines and devices: Secondary | ICD-10-CM | POA: Diagnosis not present

## 2021-06-22 LAB — CBC
HCT: 33.2 % — ABNORMAL LOW (ref 39.0–52.0)
Hemoglobin: 11.5 g/dL — ABNORMAL LOW (ref 13.0–17.0)
MCH: 29 pg (ref 26.0–34.0)
MCHC: 34.6 g/dL (ref 30.0–36.0)
MCV: 83.8 fL (ref 80.0–100.0)
Platelets: 159 10*3/uL (ref 150–400)
RBC: 3.96 MIL/uL — ABNORMAL LOW (ref 4.22–5.81)
RDW: 15.1 % (ref 11.5–15.5)
WBC: 4.9 10*3/uL (ref 4.0–10.5)
nRBC: 0 % (ref 0.0–0.2)

## 2021-06-22 LAB — BASIC METABOLIC PANEL
Anion gap: 8 (ref 5–15)
BUN: 43 mg/dL — ABNORMAL HIGH (ref 8–23)
CO2: 24 mmol/L (ref 22–32)
Calcium: 9.5 mg/dL (ref 8.9–10.3)
Chloride: 105 mmol/L (ref 98–111)
Creatinine, Ser: 1.94 mg/dL — ABNORMAL HIGH (ref 0.61–1.24)
GFR, Estimated: 36 mL/min — ABNORMAL LOW (ref >60)
Glucose, Bld: 124 mg/dL — ABNORMAL HIGH (ref 70–99)
Potassium: 4.3 mmol/L (ref 3.5–5.1)
Sodium: 137 mmol/L (ref 135–145)

## 2021-06-22 LAB — SURGICAL PCR SCREEN
MRSA, PCR: NEGATIVE
Staphylococcus aureus: NEGATIVE

## 2021-06-22 MED ORDER — ACETAMINOPHEN 500 MG PO TABS
1000.0000 mg | ORAL_TABLET | Freq: Once | ORAL | Status: AC
  Administered 2021-06-23: 1000 mg via ORAL
  Filled 2021-06-22: qty 2

## 2021-06-22 MED ORDER — CEFAZOLIN SODIUM-DEXTROSE 2-4 GM/100ML-% IV SOLN
2.0000 g | INTRAVENOUS | Status: AC
  Administered 2021-06-23: 2 g via INTRAVENOUS
  Filled 2021-06-22: qty 100

## 2021-06-22 NOTE — Anesthesia Preprocedure Evaluation (Addendum)
Anesthesia Evaluation  Patient identified by MRN, date of birth, ID band Patient awake    Reviewed: Allergy & Precautions, H&P , NPO status , Patient's Chart, lab work & pertinent test results  Airway Mallampati: II  TM Distance: >3 FB Neck ROM: Full    Dental no notable dental hx. (+) Teeth Intact, Dental Advisory Given   Pulmonary sleep apnea and Continuous Positive Airway Pressure Ventilation , former smoker,    Pulmonary exam normal breath sounds clear to auscultation       Cardiovascular Exercise Tolerance: Good +CHF   Rhythm:Regular Rate:Normal     Neuro/Psych negative neurological ROS  negative psych ROS   GI/Hepatic negative GI ROS, Neg liver ROS,   Endo/Other  diabetes, Type 2, Oral Hypoglycemic Agents  Renal/GU Renal disease  negative genitourinary   Musculoskeletal   Abdominal   Peds  Hematology negative hematology ROS (+)   Anesthesia Other Findings   Reproductive/Obstetrics negative OB ROS                            Anesthesia Physical Anesthesia Plan  ASA: 4  Anesthesia Plan: General   Post-op Pain Management: Tylenol PO (pre-op)*   Induction: Intravenous  PONV Risk Score and Plan: 3 and Ondansetron and Dexamethasone  Airway Management Planned: Oral ETT  Additional Equipment: Arterial line, CVP and Ultrasound Guidance Line Placement  Intra-op Plan:   Post-operative Plan: Possible Post-op intubation/ventilation  Informed Consent: I have reviewed the patients History and Physical, chart, labs and discussed the procedure including the risks, benefits and alternatives for the proposed anesthesia with the patient or authorized representative who has indicated his/her understanding and acceptance.     Dental advisory given  Plan Discussed with: CRNA  Anesthesia Plan Comments:       Anesthesia Quick Evaluation

## 2021-06-23 ENCOUNTER — Other Ambulatory Visit: Payer: Self-pay

## 2021-06-23 ENCOUNTER — Inpatient Hospital Stay (HOSPITAL_COMMUNITY): Payer: Medicare PPO | Admitting: Anesthesiology

## 2021-06-23 ENCOUNTER — Encounter (HOSPITAL_COMMUNITY)
Admission: RE | Disposition: A | Discharge status: Home / Self Care | Payer: Self-pay | Source: Home / Self Care | Attending: Urology

## 2021-06-23 ENCOUNTER — Encounter (HOSPITAL_COMMUNITY): Payer: Self-pay | Admitting: Urology

## 2021-06-23 DIAGNOSIS — N2889 Other specified disorders of kidney and ureter: Secondary | ICD-10-CM | POA: Diagnosis not present

## 2021-06-23 DIAGNOSIS — Z9989 Dependence on other enabling machines and devices: Secondary | ICD-10-CM | POA: Diagnosis not present

## 2021-06-23 DIAGNOSIS — G4733 Obstructive sleep apnea (adult) (pediatric): Secondary | ICD-10-CM | POA: Diagnosis not present

## 2021-06-23 DIAGNOSIS — I5022 Chronic systolic (congestive) heart failure: Secondary | ICD-10-CM

## 2021-06-23 DIAGNOSIS — I428 Other cardiomyopathies: Secondary | ICD-10-CM

## 2021-06-23 DIAGNOSIS — I493 Ventricular premature depolarization: Secondary | ICD-10-CM | POA: Diagnosis not present

## 2021-06-23 DIAGNOSIS — Z87891 Personal history of nicotine dependence: Secondary | ICD-10-CM

## 2021-06-23 HISTORY — PX: ROBOT ASSISTED LAPAROSCOPIC NEPHRECTOMY: SHX5140

## 2021-06-23 LAB — POCT I-STAT 7, (LYTES, BLD GAS, ICA,H+H)
Acid-base deficit: 1 mmol/L (ref 0.0–2.0)
Acid-base deficit: 6 mmol/L — ABNORMAL HIGH (ref 0.0–2.0)
Bicarbonate: 23.9 mmol/L (ref 20.0–28.0)
Bicarbonate: 21.4 mmol/L (ref 20.0–28.0)
Calcium, Ion: 1.25 mmol/L (ref 1.15–1.40)
Calcium, Ion: 1.21 mmol/L (ref 1.15–1.40)
HCT: 34 % — ABNORMAL LOW (ref 39.0–52.0)
HCT: 35 % — ABNORMAL LOW (ref 39.0–52.0)
Hemoglobin: 11.6 g/dL — ABNORMAL LOW (ref 13.0–17.0)
Hemoglobin: 11.9 g/dL — ABNORMAL LOW (ref 13.0–17.0)
O2 Saturation: 100 %
O2 Saturation: 99 %
Potassium: 3.7 mmol/L (ref 3.5–5.1)
Potassium: 3.7 mmol/L (ref 3.5–5.1)
Sodium: 137 mmol/L (ref 135–145)
Sodium: 140 mmol/L (ref 135–145)
TCO2: 25 mmol/L (ref 22–32)
TCO2: 23 mmol/L (ref 22–32)
pCO2 arterial: 39.4 mmHg (ref 32–48)
pCO2 arterial: 49.9 mmHg — ABNORMAL HIGH (ref 32–48)
pH, Arterial: 7.391 (ref 7.35–7.45)
pH, Arterial: 7.24 — ABNORMAL LOW (ref 7.35–7.45)
pO2, Arterial: 248 mmHg — ABNORMAL HIGH (ref 83–108)
pO2, Arterial: 140 mmHg — ABNORMAL HIGH (ref 83–108)

## 2021-06-23 LAB — GLUCOSE, CAPILLARY
Glucose-Capillary: 91 mg/dL (ref 70–99)
Glucose-Capillary: 167 mg/dL — ABNORMAL HIGH (ref 70–99)
Glucose-Capillary: 133 mg/dL — ABNORMAL HIGH (ref 70–99)
Glucose-Capillary: 134 mg/dL — ABNORMAL HIGH (ref 70–99)

## 2021-06-23 LAB — HEMOGLOBIN AND HEMATOCRIT, BLOOD
HCT: 34.9 % — ABNORMAL LOW (ref 39.0–52.0)
Hemoglobin: 12.1 g/dL — ABNORMAL LOW (ref 13.0–17.0)

## 2021-06-23 LAB — MAGNESIUM: Magnesium: 2.4 mg/dL (ref 1.7–2.4)

## 2021-06-23 LAB — PREPARE RBC (CROSSMATCH)

## 2021-06-23 SURGERY — NEPHRECTOMY, RADICAL, ROBOT-ASSISTED, LAPAROSCOPIC, ADULT
Anesthesia: General | Laterality: Right

## 2021-06-23 MED ORDER — TORSEMIDE 20 MG PO TABS
20.0000 mg | ORAL_TABLET | Freq: Every day | ORAL | Status: DC
  Filled 2021-06-23: qty 1

## 2021-06-23 MED ORDER — ONDANSETRON HCL 4 MG/2ML IJ SOLN
INTRAMUSCULAR | Status: DC | PRN
  Administered 2021-06-23: 4 mg via INTRAVENOUS

## 2021-06-23 MED ORDER — ONDANSETRON HCL 4 MG/2ML IJ SOLN
INTRAMUSCULAR | Status: AC
  Filled 2021-06-23: qty 2

## 2021-06-23 MED ORDER — CEFAZOLIN SODIUM-DEXTROSE 1-4 GM/50ML-% IV SOLN
1.0000 g | Freq: Three times a day (TID) | INTRAVENOUS | Status: AC
  Administered 2021-06-23 – 2021-06-24 (×2): 1 g via INTRAVENOUS
  Filled 2021-06-23 (×2): qty 50

## 2021-06-23 MED ORDER — BUPIVACAINE LIPOSOME 1.3 % IJ SUSP
INTRAMUSCULAR | Status: AC
Start: 2021-06-23 — End: ?
  Filled 2021-06-23: qty 20

## 2021-06-23 MED ORDER — ROCURONIUM BROMIDE 100 MG/10ML IV SOLN
INTRAVENOUS | Status: DC | PRN
  Administered 2021-06-23 (×2): 30 mg via INTRAVENOUS
  Administered 2021-06-23: 70 mg via INTRAVENOUS

## 2021-06-23 MED ORDER — SODIUM CHLORIDE 0.45 % IV SOLN: INTRAVENOUS | Status: DC

## 2021-06-23 MED ORDER — ETOMIDATE 2 MG/ML IV SOLN
INTRAVENOUS | Status: AC
  Filled 2021-06-23: qty 10

## 2021-06-23 MED ORDER — HYDROMORPHONE HCL 1 MG/ML IJ SOLN
0.2500 mg | INTRAMUSCULAR | Status: DC | PRN
  Administered 2021-06-23: 0.5 mg via INTRAVENOUS

## 2021-06-23 MED ORDER — DEXAMETHASONE SODIUM PHOSPHATE 10 MG/ML IJ SOLN
INTRAMUSCULAR | Status: AC
  Filled 2021-06-23: qty 1

## 2021-06-23 MED ORDER — PHENYLEPHRINE HCL-NACL 20-0.9 MG/250ML-% IV SOLN
INTRAVENOUS | Status: DC | PRN
  Administered 2021-06-23: 20 ug/min via INTRAVENOUS

## 2021-06-23 MED ORDER — CHLORHEXIDINE GLUCONATE 0.12 % MT SOLN
15.0000 mL | Freq: Once | OROMUCOSAL | Status: AC
  Administered 2021-06-23: 15 mL via OROMUCOSAL

## 2021-06-23 MED ORDER — EMPAGLIFLOZIN 10 MG PO TABS
10.0000 mg | ORAL_TABLET | Freq: Every day | ORAL | Status: DC
  Administered 2021-06-23 – 2021-06-26 (×4): 10 mg via ORAL
  Filled 2021-06-23 (×5): qty 1

## 2021-06-23 MED ORDER — ROCURONIUM BROMIDE 10 MG/ML (PF) SYRINGE
PREFILLED_SYRINGE | INTRAVENOUS | Status: AC
  Filled 2021-06-23: qty 10

## 2021-06-23 MED ORDER — NOREPINEPHRINE 4 MG/250ML-% IV SOLN
0.0000 ug/min | INTRAVENOUS | Status: DC
  Administered 2021-06-23: 2 ug/min via INTRAVENOUS
  Filled 2021-06-23: qty 250

## 2021-06-23 MED ORDER — DIPHENHYDRAMINE HCL 12.5 MG/5ML PO ELIX: 12.5000 mg | ORAL_SOLUTION | Freq: Four times a day (QID) | ORAL | Status: DC | PRN

## 2021-06-23 MED ORDER — MIDAZOLAM HCL 5 MG/5ML IJ SOLN
INTRAMUSCULAR | Status: DC | PRN
  Administered 2021-06-23: 2 mg via INTRAVENOUS

## 2021-06-23 MED ORDER — FENTANYL CITRATE (PF) 100 MCG/2ML IJ SOLN
INTRAMUSCULAR | Status: DC | PRN
  Administered 2021-06-23 (×6): 50 ug via INTRAVENOUS

## 2021-06-23 MED ORDER — SPIRONOLACTONE 25 MG PO TABS: 25.0000 mg | ORAL_TABLET | Freq: Every day | ORAL | Status: DC

## 2021-06-23 MED ORDER — OXYCODONE HCL 5 MG PO TABS
5.0000 mg | ORAL_TABLET | ORAL | Status: DC | PRN
  Administered 2021-06-24 – 2021-06-25 (×3): 5 mg via ORAL
  Filled 2021-06-23 (×3): qty 1

## 2021-06-23 MED ORDER — PROPOFOL 10 MG/ML IV BOLUS
INTRAVENOUS | Status: AC
  Filled 2021-06-23: qty 20

## 2021-06-23 MED ORDER — SODIUM CHLORIDE 0.9 % IV SOLN: INTRAVENOUS | Status: DC | PRN

## 2021-06-23 MED ORDER — FENTANYL CITRATE (PF) 250 MCG/5ML IJ SOLN
INTRAMUSCULAR | Status: AC
  Filled 2021-06-23: qty 5

## 2021-06-23 MED ORDER — LIDOCAINE HCL (PF) 2 % IJ SOLN
INTRAMUSCULAR | Status: AC
  Filled 2021-06-23: qty 5

## 2021-06-23 MED ORDER — BUPIVACAINE LIPOSOME 1.3 % IJ SUSP
INTRAMUSCULAR | Status: DC | PRN
  Administered 2021-06-23: 20 mL

## 2021-06-23 MED ORDER — PHENYLEPHRINE HCL (PRESSORS) 10 MG/ML IV SOLN
INTRAVENOUS | Status: DC | PRN
  Administered 2021-06-23: 160 ug via INTRAVENOUS
  Administered 2021-06-23: 80 ug via INTRAVENOUS
  Administered 2021-06-23: 160 ug via INTRAVENOUS
  Administered 2021-06-23 (×2): 80 ug via INTRAVENOUS

## 2021-06-23 MED ORDER — DIGOXIN 125 MCG PO TABS
0.1250 mg | ORAL_TABLET | Freq: Every day | ORAL | Status: DC
  Administered 2021-06-23 – 2021-06-26 (×4): 0.125 mg via ORAL
  Filled 2021-06-23 (×4): qty 1

## 2021-06-23 MED ORDER — HYDROCODONE-ACETAMINOPHEN 5-325 MG PO TABS: 1.0000 | ORAL_TABLET | Freq: Four times a day (QID) | ORAL | 0 refills | Status: DC | PRN

## 2021-06-23 MED ORDER — SUGAMMADEX SODIUM 200 MG/2ML IV SOLN
INTRAVENOUS | Status: DC | PRN
  Administered 2021-06-23: 200 mg via INTRAVENOUS

## 2021-06-23 MED ORDER — LACTATED RINGERS IR SOLN
Status: DC | PRN
  Administered 2021-06-23: 1000 mL

## 2021-06-23 MED ORDER — HYDROMORPHONE HCL 1 MG/ML IJ SOLN
0.5000 mg | INTRAMUSCULAR | Status: DC | PRN
  Administered 2021-06-23: 1 mg via INTRAVENOUS
  Administered 2021-06-23: 0.5 mg via INTRAVENOUS
  Filled 2021-06-23 (×2): qty 1

## 2021-06-23 MED ORDER — CHLORHEXIDINE GLUCONATE CLOTH 2 % EX PADS
6.0000 | MEDICATED_PAD | Freq: Every day | CUTANEOUS | Status: DC
  Administered 2021-06-23 – 2021-06-25 (×3): 6 via TOPICAL

## 2021-06-23 MED ORDER — TRIPLE ANTIBIOTIC 3.5-400-5000 EX OINT: 1.0000 | TOPICAL_OINTMENT | Freq: Three times a day (TID) | CUTANEOUS | Status: DC | PRN

## 2021-06-23 MED ORDER — SACUBITRIL-VALSARTAN 24-26 MG PO TABS
1.0000 | ORAL_TABLET | Freq: Two times a day (BID) | ORAL | Status: DC
  Filled 2021-06-23: qty 1

## 2021-06-23 MED ORDER — PROMETHAZINE HCL 12.5 MG PO TABS: 12.5000 mg | ORAL_TABLET | ORAL | 0 refills | Status: DC | PRN

## 2021-06-23 MED ORDER — ROCURONIUM BROMIDE 10 MG/ML (PF) SYRINGE
PREFILLED_SYRINGE | INTRAVENOUS | Status: AC
Start: 2021-06-23 — End: ?
  Filled 2021-06-23: qty 10

## 2021-06-23 MED ORDER — ACETAMINOPHEN 500 MG PO TABS
1000.0000 mg | ORAL_TABLET | Freq: Four times a day (QID) | ORAL | Status: AC
  Administered 2021-06-23 – 2021-06-24 (×4): 1000 mg via ORAL
  Filled 2021-06-23 (×4): qty 2

## 2021-06-23 MED ORDER — HYDROMORPHONE HCL 1 MG/ML IJ SOLN
INTRAMUSCULAR | Status: AC
  Filled 2021-06-23: qty 2

## 2021-06-23 MED ORDER — SODIUM CHLORIDE (PF) 0.9 % IJ SOLN
INTRAMUSCULAR | Status: DC | PRN
  Administered 2021-06-23: 20 mL

## 2021-06-23 MED ORDER — ETOMIDATE 2 MG/ML IV SOLN
INTRAVENOUS | Status: DC | PRN
  Administered 2021-06-23: 12 mg via INTRAVENOUS

## 2021-06-23 MED ORDER — DIPHENHYDRAMINE HCL 50 MG/ML IJ SOLN: 12.5000 mg | Freq: Four times a day (QID) | INTRAMUSCULAR | Status: DC | PRN

## 2021-06-23 MED ORDER — LACTATED RINGERS IV SOLN: INTRAVENOUS | Status: DC

## 2021-06-23 MED ORDER — ROSUVASTATIN CALCIUM 20 MG PO TABS
40.0000 mg | ORAL_TABLET | Freq: Every day | ORAL | Status: DC
  Administered 2021-06-23 – 2021-06-26 (×4): 40 mg via ORAL
  Filled 2021-06-23 (×4): qty 2

## 2021-06-23 MED ORDER — HYOSCYAMINE SULFATE 0.125 MG SL SUBL: 0.1250 mg | SUBLINGUAL_TABLET | SUBLINGUAL | Status: DC | PRN

## 2021-06-23 MED ORDER — SODIUM CHLORIDE (PF) 0.9 % IJ SOLN
INTRAMUSCULAR | Status: AC
  Filled 2021-06-23: qty 20

## 2021-06-23 MED ORDER — STERILE WATER FOR IRRIGATION IR SOLN
Status: DC | PRN
  Administered 2021-06-23: 1000 mL

## 2021-06-23 MED ORDER — MIDAZOLAM HCL 2 MG/2ML IJ SOLN
INTRAMUSCULAR | Status: AC
  Filled 2021-06-23: qty 2

## 2021-06-23 MED ORDER — ONDANSETRON HCL 4 MG/2ML IJ SOLN
4.0000 mg | INTRAMUSCULAR | Status: DC | PRN
  Administered 2021-06-23: 4 mg via INTRAVENOUS
  Filled 2021-06-23: qty 2

## 2021-06-23 MED ORDER — INSULIN ASPART 100 UNIT/ML IJ SOLN
0.0000 [IU] | Freq: Three times a day (TID) | INTRAMUSCULAR | Status: DC
  Administered 2021-06-23: 2 [IU] via SUBCUTANEOUS

## 2021-06-23 MED ORDER — FENTANYL CITRATE (PF) 100 MCG/2ML IJ SOLN
INTRAMUSCULAR | Status: AC
  Filled 2021-06-23: qty 2

## 2021-06-23 MED ORDER — PHENYLEPHRINE HCL-NACL 20-0.9 MG/250ML-% IV SOLN
INTRAVENOUS | Status: AC
  Filled 2021-06-23: qty 250

## 2021-06-23 MED ORDER — DOCUSATE SODIUM 100 MG PO CAPS
100.0000 mg | ORAL_CAPSULE | Freq: Every day | ORAL | Status: DC | PRN
Start: 2021-06-23 — End: 2021-06-26

## 2021-06-23 MED ORDER — ORAL CARE MOUTH RINSE: 15.0000 mL | Freq: Once | OROMUCOSAL | Status: AC

## 2021-06-23 MED ORDER — ORAL CARE MOUTH RINSE: 15.0000 mL | OROMUCOSAL | Status: DC | PRN

## 2021-06-23 SURGICAL SUPPLY — 68 items
BAG COUNTER SPONGE SURGICOUNT (BAG) IMPLANT
BAG LAPAROSCOPIC 12 15 PORT 16 (BASKET) ×1 IMPLANT
BAG RETRIEVAL 12/15 (BASKET) ×2
CHLORAPREP W/TINT 26 (MISCELLANEOUS) ×2 IMPLANT
CLIP LIGATING HEM O LOK PURPLE (MISCELLANEOUS) ×4 IMPLANT
CLIP LIGATING HEMO LOK XL GOLD (MISCELLANEOUS) ×2 IMPLANT
CLIP LIGATING HEMO O LOK GREEN (MISCELLANEOUS) ×1 IMPLANT
COVER SURGICAL LIGHT HANDLE (MISCELLANEOUS) ×2 IMPLANT
COVER TIP SHEARS 8 DVNC (MISCELLANEOUS) ×1 IMPLANT
COVER TIP SHEARS 8MM DA VINCI (MISCELLANEOUS) ×2
CUTTER ECHEON FLEX ENDO 45 340 (ENDOMECHANICALS) ×1 IMPLANT
DERMABOND ADVANCED (GAUZE/BANDAGES/DRESSINGS) ×1
DERMABOND ADVANCED .7 DNX12 (GAUZE/BANDAGES/DRESSINGS) ×1 IMPLANT
DRAPE ARM DVNC X/XI (DISPOSABLE) ×4 IMPLANT
DRAPE COLUMN DVNC XI (DISPOSABLE) ×1 IMPLANT
DRAPE DA VINCI XI ARM (DISPOSABLE) ×8
DRAPE DA VINCI XI COLUMN (DISPOSABLE) ×2
DRAPE INCISE IOBAN 66X45 STRL (DRAPES) ×2 IMPLANT
DRAPE SHEET LG 3/4 BI-LAMINATE (DRAPES) ×2 IMPLANT
ELECT PENCIL ROCKER SW 15FT (MISCELLANEOUS) ×2 IMPLANT
ELECT REM PT RETURN 15FT ADLT (MISCELLANEOUS) ×2 IMPLANT
GAUZE 4X4 16PLY ~~LOC~~+RFID DBL (SPONGE) ×2 IMPLANT
GLOVE BIO SURGEON STRL SZ 6.5 (GLOVE) ×2 IMPLANT
GLOVE BIOGEL PI IND STRL 8 (GLOVE) ×1 IMPLANT
GLOVE BIOGEL PI INDICATOR 8 (GLOVE) ×1
GLOVE SURG LX 7.5 STRW (GLOVE) ×2
GLOVE SURG LX STRL 7.5 STRW (GLOVE) ×2 IMPLANT
GOWN SRG XL LVL 4 BRTHBL STRL (GOWNS) ×1 IMPLANT
GOWN STRL NON-REIN XL LVL4 (GOWNS) ×2
GOWN STRL REUS W/ TWL LRG LVL3 (GOWN DISPOSABLE) ×1 IMPLANT
GOWN STRL REUS W/ TWL XL LVL3 (GOWN DISPOSABLE) ×2 IMPLANT
GOWN STRL REUS W/TWL LRG LVL3 (GOWN DISPOSABLE) ×2
GOWN STRL REUS W/TWL XL LVL3 (GOWN DISPOSABLE) ×4
HEMOSTAT SURGICEL 4X8 (HEMOSTASIS) ×1 IMPLANT
HOLDER FOLEY CATH W/STRAP (MISCELLANEOUS) ×2 IMPLANT
IRRIG SUCT STRYKERFLOW 2 WTIP (MISCELLANEOUS) ×2
IRRIGATION SUCT STRKRFLW 2 WTP (MISCELLANEOUS) ×1 IMPLANT
KIT BASIN OR (CUSTOM PROCEDURE TRAY) ×2 IMPLANT
KIT TURNOVER KIT A (KITS) IMPLANT
MARKER SKIN DUAL TIP RULER LAB (MISCELLANEOUS) ×2 IMPLANT
NDL INSUFFLATION 14GA 120MM (NEEDLE) ×1 IMPLANT
NEEDLE INSUFFLATION 14GA 120MM (NEEDLE) ×2 IMPLANT
PROTECTOR NERVE ULNAR (MISCELLANEOUS) ×4 IMPLANT
RELOAD STAPLE 45 2.6 WHT THIN (STAPLE) IMPLANT
SCISSORS LAP 5X45 EPIX DISP (ENDOMECHANICALS) ×2 IMPLANT
SEAL CANN UNIV 5-8 DVNC XI (MISCELLANEOUS) ×3 IMPLANT
SEAL XI 5MM-8MM UNIVERSAL (MISCELLANEOUS) ×8
SEALER VESSEL DA VINCI XI (MISCELLANEOUS) ×2
SEALER VESSEL EXT DVNC XI (MISCELLANEOUS) IMPLANT
SET TUBE SMOKE EVAC HIGH FLOW (TUBING) ×2 IMPLANT
SOLUTION ELECTROLUBE (MISCELLANEOUS) ×2 IMPLANT
SPIKE FLUID TRANSFER (MISCELLANEOUS) ×2 IMPLANT
STAPLE RELOAD 45 WHT (STAPLE) ×2 IMPLANT
STAPLE RELOAD 45MM WHITE (STAPLE) ×4
SUT MNCRL AB 4-0 PS2 18 (SUTURE) ×4 IMPLANT
SUT PDS AB 0 CT1 36 (SUTURE) ×4 IMPLANT
SUT VIC AB 0 CT1 27 (SUTURE) ×2
SUT VIC AB 0 CT1 27XBRD ANTBC (SUTURE) IMPLANT
SUT VIC AB 2-0 SH 27 (SUTURE) ×2
SUT VIC AB 2-0 SH 27X BRD (SUTURE) IMPLANT
SUT VICRYL 0 UR6 27IN ABS (SUTURE) IMPLANT
TOWEL OR 17X26 10 PK STRL BLUE (TOWEL DISPOSABLE) ×2 IMPLANT
TOWEL OR NON WOVEN STRL DISP B (DISPOSABLE) ×2 IMPLANT
TRAY FOLEY MTR SLVR 16FR STAT (SET/KITS/TRAYS/PACK) ×2 IMPLANT
TRAY LAPAROSCOPIC (CUSTOM PROCEDURE TRAY) ×2 IMPLANT
TROCAR ADV FIXATION 12X100MM (TROCAR) ×2 IMPLANT
TROCAR Z-THREAD OPTICAL 5X100M (TROCAR) IMPLANT
WATER STERILE IRR 1000ML POUR (IV SOLUTION) ×2 IMPLANT

## 2021-06-23 NOTE — Op Note (Signed)
Operative Note  Preoperative diagnosis:  1.  10.2 cm right renal mass  Postoperative diagnosis: 1.  10.2 cm right renal mass  Procedure(s): 1.  Robot-assisted laparoscopic right radical nephrectomy  Surgeon: Ellison Hughs, MD  Assistants: 1.  Debbrah Alar, PA-C  An assistant was required for this surgical procedure.  The duties of the assistant included but were not limited to suctioning, passing suture, camera manipulation, retraction.  This procedure would not be able to be performed without an Environmental consultant.  2.  Hinton Rao, MD PGY4  Anesthesia:  General  Complications:  None  EBL: 500 mL  Specimens: 1.  Right kidney and adrenal glands  Drains/Catheters: 1.  Foley catheter  Intraoperative findings:   Hemostatic right renal hilum following staple ligation  Indication:  Bryce Cain is a 71 y.o. male with a 10.2 cm solid and enhancing right renal mass with features concerning for renal cell carcinoma.  He also has a significant history of congestive heart failure with an ejection fraction of approximately 16%.  He has been consented for the above procedures, voices understanding and wishes to proceed.  Description of procedure:  After informed consent was signed, the patient was taken back to the operating room and properly anesthetized.  The patient was then placed in the left lateral decubitus position with all pressure points padded.  The abdomen was then prepped and draped in the usual sterile fashion.  A time-out was then performed.     An 8 mm incision was then made lateral to the right rectus muscle at the level of the right 12th rib.  A Veress needle was then used to access the abdominal cavity.  A saline drop test showed no signs of obstruction and aspiration of the Veress needle revealed no blood or sucus.  The abdominal cavity was then insufflated to 15 mmHg.  An 8 mm robotic trocar was then atraumatically inserted into the abdominal cavity.  The robotic camera was  then inserted through the port and inspection of the abdominal cavity revealed no evidence of adjacent organ or vessel injury. We then placed three additional 8 mm robotic ports and a 12 mm assistant port such as fashion as to triangulate the right renal hilum.  The robot was then docked into postion.   Using a combination of blunt and cold scissors dissection, the hepatic attachments were released from the abdominal sidewall. The white line of Toldt along the ascending colon was then incised, allowing [place] to reflect the colon medially and expose the anterior surface of the right kidney.  The duodenum was then Kocherized medially, which abruptly led [place] to the identification of the inferior vena cava.    Once the colon was adequately mobilized, we moved to the lower pole and identified the gonadal vein and ureter.  The gonadal vein was then left running parallel to the vena cava and the right ureter was reflected anteriorly.  Using cautious cautery, the overlying perihilar attachments were then released.  This yielded visualization of the renal hilum, which included a two right renal veins and two right renal arteries.  The perilymphatic tissue surrounding the right renal arteries were carefully released so that they right were fully encircled.    A 45 mm powered endovascular stapler was then used to ligate both right renal arteries and veins, en bloc.  The right hilar stumps were hemostatic following staple ligation.  Hemoclips were then applied to the proximal aspects of the right ureter, which was then sharply incised.  The remaining perinephric attachments were then incised using electrocautery.  The right adrenal gland was excised with the right kidney. Reinspection of the right retroperitoneal space revealed excellent hemostasis. Once the right kidney was fully mobile, it was placed in an Endo Catch bag and left in the abdominal cavity.   The 12 mm midline assistant port was then closed using the [employer] technique with a 0 Vicryl suture.  A right lower quadrant Gibson incision was then made in the right kidney was removed along with the Endo Catch bag.  The fascia of the external and internal oblique were then closed with a running 0 PDS suture.  The skin incisions were then closed using 4-0 Monocryl.  Dermabond was applied to all skin incisions.  Plan:  Monitor on the floor

## 2021-06-23 NOTE — Anesthesia Postprocedure Evaluation (Signed)
Anesthesia Post Note  Patient: Bryce Cain  Procedure(s) Performed: XI ROBOTIC ASSISTED LAPAROSCOPIC NEPHRECTOMY (Right)     Patient location during evaluation: PACU Anesthesia Type: General Level of consciousness: awake and alert Pain management: pain level controlled Vital Signs Assessment: post-procedure vital signs reviewed and stable Respiratory status: spontaneous breathing, nonlabored ventilation, respiratory function stable and patient connected to nasal cannula oxygen Cardiovascular status: blood pressure returned to baseline and stable Postop Assessment: no apparent nausea or vomiting Anesthetic complications: no   No notable events documented.  Last Vitals:  Vitals:   06/23/21 1300 06/23/21 1334  BP: 128/68 116/67  Pulse: 84 (!) 120  Resp: 19 14  Temp: (!) 36.1 C (!) 35.4 C  SpO2: 96% 100%    Last Pain:  Vitals:   06/23/21 1334  TempSrc: Axillary  PainSc: 7                  Lexxie Winberg,W. EDMOND

## 2021-06-23 NOTE — Transfer of Care (Signed)
Immediate Anesthesia Transfer of Care Note  Patient: Bryce Cain  Procedure(s) Performed: XI ROBOTIC ASSISTED LAPAROSCOPIC NEPHRECTOMY (Right)  Patient Location: PACU  Anesthesia Type:General  Level of Consciousness: awake  Airway & Oxygen Therapy: Patient Spontanous Breathing  Post-op Assessment: Report given to RN  Post vital signs: stable  Last Vitals:  Vitals Value Taken Time  BP 156/98 06/23/21 1155  Temp    Pulse 93 06/23/21 1159  Resp 14 06/23/21 1159  SpO2 91 % 06/23/21 1159  Vitals shown include unvalidated device data.  Last Pain:  Vitals:   06/23/21 0617  TempSrc: Oral  PainSc: 0-No pain      Patients Stated Pain Goal: 3 (67/28/97 9150)  Complications: No notable events documented.

## 2021-06-23 NOTE — Discharge Instructions (Signed)
Activity:  You are encouraged to ambulate frequently (about every hour during waking hours) to help prevent blood clots from forming in your legs or lungs.  However, you should not engage in any heavy lifting (> 10-15 lbs), strenuous activity, or straining. Diet: You should advance your diet as instructed by your physician.  It will be normal to have some bloating, nausea, and abdominal discomfort intermittently. Prescriptions:  You will be provided a prescription for pain medication to take as needed.  If your pain is not severe enough to require the prescription pain medication, you may take extra strength Tylenol instead which will have less side effects.  You should also take a prescribed stool softener to avoid straining with bowel movements as the prescription pain medication may constipate you. Incisions: You may remove your dressing bandages 48 hours after surgery if not removed in the hospital.  You will either have some small staples or special tissue glue at each of the incision sites. Once the bandages are removed (if present), the incisions may stay open to air.  You may start showering (but not soaking or bathing in water) the 2nd day after surgery and the incisions simply need to be patted dry after the shower.  No additional care is needed. What to call us about: You should call the office [phone]) if you develop fever > 101 or develop persistent vomiting.    You may resume advil, aleve, vitamins, and supplements 7 days after surgery.

## 2021-06-23 NOTE — Anesthesia Procedure Notes (Signed)
Procedure Name: Intubation Date/Time: 06/23/2021 8:15 AM  Performed by: Korianna Washer, Forest Gleason, CRNAPre-anesthesia Checklist: Patient identified, Emergency Drugs available, Suction available, Patient being monitored and Timeout performed Patient Re-evaluated:Patient Re-evaluated prior to induction Oxygen Delivery Method: Circle system utilized Preoxygenation: Pre-oxygenation with 100% oxygen Induction Type: IV induction Ventilation: Mask ventilation without difficulty Laryngoscope Size: Mac and 4 Grade View: Grade II Tube type: Oral Tube size: 7.5 mm Number of attempts: 1 Airway Equipment and Method: Stylet Placement Confirmation: ETT inserted through vocal cords under direct vision, positive ETCO2, CO2 detector and breath sounds checked- equal and bilateral Secured at: 22 cm Tube secured with: Tape Dental Injury: Teeth and Oropharynx as per pre-operative assessment

## 2021-06-23 NOTE — H&P (Signed)
PRE-OP H&P   Office Visit Report     05/28/2021    --------------------------------------------------------------------------------   Bryce Cain  MRN: [id number]  DOB: Feb 06, 1950, 71 year old Male  SSN:    PRIMARY CARE:    REFERRING:  Bryce Gilder A. Lovena Neighbours, MD  PROVIDER:  Ellison Cain, M.D.  TREATING:  Bryce Crocker, NP  LOCATION:  [facility] [phone]      --------------------------------------------------------------------------------   CC/HPI: Right renal mass    Bryce Cain is a 71 year old male with a solid enhancing 10.2 cm right renal mass with features concerning for renal cell carcinoma. He has a significant past medical history of congestive heart failure with a left ventricular ejection fraction of 16% as well as a left bundle branch block. Currently followed by Bryce Cain. His right renal mass was incidentally identified on 03/20/2021 during evaluation for possible pulmonary embolism. Chest imaging showed no evidence of metastatic disease. He is a retired Gaffer at [employer]. He previously smoked a pipe for several years approximately 4 years ago. He notes no significant personal or family history of GU malignancies. No prior abdominal surgeries.    -Renal function: Serum creatinine- 1.3, eGFR- 58  -Normal left kidney      05/25/2021: Patient here for preoperative appointment prior to undergoing right robotic nephrectomy with Dr. Lovena Cain on 6/9. Due to complex medical comorbidities including congestive heart failure, patient is being admitted on 6/8 at the recommendation of his cardiologist. Denies any changes in past medical history, prescription medications taken on daily basis. No interval surgical or procedural intervention. He did have a GI illness with diarrhea for about 4 days a month ago but has since recovered from this. He has had no interval infection treatment. Voiding at his baseline with stable, grossly nonbothersome  symptomology. He denies any dysuria or gross hematuria. He is not having any pain/discomfort suggestive of obstructive uropathy. Currently without chest pain, shortness of breath or significant dyspnea upon exertion. Denies recent fevers or chills, nausea/vomiting.   *Surgery on 06/12/21 was cancelled due to OR delay.      ALLERGIES: None    MEDICATIONS: Acidophilus  Aspirin Ec 81 mg tablet, delayed release  Colace  Digoxin  Entresto 24 mg-26 mg tablet  Jardiance 10 mg tablet  Multivitamins  Omega 3  Potassium Chloride  Prevident 5000 Dry Mouth  Rosuvastatin Calcium 40 mg tablet  Spironolactone 25 mg tablet  Torsemide 20 mg tablet      GU PSH: No GU PSH      PSH Notes: stent (cardiac) x 2    NON-GU PSH: No Non-GU PSH     GU PMH: Chronic kidney disease stage 3 (GFR 30-60) - 03/30/2021, - 03/30/2021 Right renal neoplasm - 03/30/2021, - 03/30/2021     NON-GU PMH: Arrhythmia Chronic systolic (congestive) heart failure Diabetes Type 2 Hypercholesterolemia Hypertension Left bundle-branch block, unspecified Other sleep apnea     FAMILY HISTORY: No Family History     SOCIAL HISTORY: Marital Status: Married Preferred Language: English; Ethnicity: Not Hispanic Or Latino; Race: White Current Smoking Status: Patient does not smoke anymore.    Tobacco Use Assessment Completed: Used Tobacco in last 30 days? Has never drank.  Drinks 2 caffeinated drinks per day.     REVIEW OF SYSTEMS:    GU Review Male:   Patient reports frequent urination and get up at night to urinate. Patient denies hard to postpone urination, burning/ pain with urination, leakage of urine, stream starts and stops, trouble starting  your stream, have to strain to urinate , erection problems, and penile pain.  Gastrointestinal (Upper):   Patient denies nausea, vomiting, and indigestion/ heartburn.  Gastrointestinal (Lower):   Patient denies diarrhea and constipation.  Constitutional:   Patient denies fever, night  sweats, weight loss, and fatigue.  Skin:   Patient denies skin rash/ lesion and itching.  Eyes:   Patient denies blurred vision and double vision.  Ears/ Nose/ Throat:   Patient denies sore throat and sinus problems.  Hematologic/Lymphatic:   Patient denies swollen glands and easy bruising.  Cardiovascular:   Patient reports leg swelling. Patient denies chest pains.  Respiratory:   Patient denies cough and shortness of breath.  Endocrine:   Patient denies excessive thirst.  Musculoskeletal:   Patient denies back pain and joint pain.  Neurological:   Patient denies dizziness and headaches.  Psychologic:   Patient denies depression and anxiety.    VITAL SIGNS:      05/28/2021 02:21 PM  BP 117/73 mmHg  Pulse 76 /min  Temperature 97.1 F / 36.1 C    MULTI-SYSTEM PHYSICAL EXAMINATION:    Constitutional: Well-nourished. No physical deformities. Normally developed. Good grooming.  Neck: Neck symmetrical, not swollen. Normal tracheal position.  Respiratory: No labored breathing, no use of accessory muscles.   Cardiovascular: Normal temperature, normal extremity pulses, no swelling, no varicosities. AICD in left upper chest.  Skin: No paleness, no jaundice, no cyanosis. No lesion, no ulcer, no rash.  Neurologic / Psychiatric: Oriented to time, oriented to place, oriented to person. No depression, no anxiety, no agitation.  Gastrointestinal: Obese abdomen. No mass, no tenderness, no rigidity.   Musculoskeletal: Normal gait and station of head and neck.      Complexity of Data:  Source Of History:  Patient, Family/Caregiver, Medical Record Summary  Records Review:   Previous Doctor Records, Previous Hospital Records, Previous Patient Records  Urine Test Review:   Urinalysis  X-Ray Review: C.T. Abdomen/Pelvis: Reviewed Films. Reviewed Report.  MRI Abdomen: Reviewed Films. Reviewed Report.     05/28/21  Urinalysis  Urine Appearance Clear   Urine Color Straw   Urine Glucose 1+ mg/dL  Urine  Bilirubin Neg mg/dL  Urine Ketones Neg mg/dL  Urine Specific Gravity 1.010   Urine Blood Neg ery/uL  Urine pH 5.5   Urine Protein Neg mg/dL  Urine Urobilinogen 0.2 mg/dL  Urine Nitrites Neg   Urine Leukocyte Esterase Neg leu/uL    PROCEDURES:           Urinalysis Dipstick Dipstick Cont'd  Color: Straw Bilirubin: Neg mg/dL  Appearance: Clear Ketones: Neg mg/dL  Specific Gravity: 1.010 Blood: Neg ery/uL  pH: 5.5 Protein: Neg mg/dL  Glucose: 1+ mg/dL Urobilinogen: 0.2 mg/dL    Nitrites: Neg    Leukocyte Esterase: Neg leu/uL      ASSESSMENT:      ICD-10 Details  1 GU:   Right renal neoplasm - D49.511 Chronic, Threat to Bodily Function  2 NON-GU:   Encounter for other preprocedural examination - Z01.818 Undiagnosed New Problem    PLAN:            Orders Labs Urine Culture            Schedule Return Visit/Planned Activity: Keep Scheduled Appointment - Follow up MD, Schedule Surgery            Document Letter(s):  Created for Patient: Clinical Summary           Notes:  I reviewed imaging results and  films with the patient personally. We discussed that the mass in question has features concerning for malignancy. I explained the natural history of presumed renal cell carcinoma. I reviewed the AUA guidelines for evaluation and treatment of renal masses. The options of active surveillance, in situ tumor ablation, partial and radical nephrectomy was discussed. The risks of robot-assisted laparoscopic RIGHT radical nephrectomy were discussed in detail including but not limited to: negative pathology, open conversion, infection of the skin/abdominal cavity, VTE, MI/CVA, lymphatic leak, injury to adjacent solid/hollow viscus organs, bleeding requiring a blood transfusion, catastrophic bleeding, hernia formation and other imponderables. The patient voices understanding and wishes to proceed.

## 2021-06-23 NOTE — Anesthesia Procedure Notes (Signed)
Central Venous Catheter Insertion Start/End6/20/2023 7:15 AM, 06/23/2021 7:30 AM Patient location: Pre-op. Preanesthetic checklist: patient identified, IV checked, site marked, risks and benefits discussed, surgical consent, monitors and equipment checked, pre-op evaluation, timeout performed and anesthesia consent Position: Trendelenburg Lidocaine 1% used for infiltration and patient sedated Hand hygiene performed , maximum sterile barriers used  and Seldinger technique used Catheter size: 8 Fr Total catheter length 16. Central line was placed.Double lumen Procedure performed using ultrasound guided technique. Ultrasound Notes:anatomy identified, needle tip was noted to be adjacent to the nerve/plexus identified, no ultrasound evidence of intravascular and/or intraneural injection and image(s) printed for medical record Attempts: 1 Following insertion, dressing applied, line sutured and Biopatch. Post procedure assessment: blood return through all ports  Patient tolerated the procedure well with no immediate complications.

## 2021-06-23 NOTE — Consult Note (Addendum)
Cardiology Consultation:   Patient ID: KHIAN REMO MRN: [id number]; DOB: 1950/05/20  Admit date: 06/22/2021 Date of Consult: 06/23/2021  PCP:  [name], [name], MD   [facility] Providers Cardiologist:  None     Patient Profile:   SARVESH MEDDAUGH is a 71 y.o. 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 Lovena Neighbours.  History of Present Illness:   Mr. Sako is a 71 year old male with above medical history who is followed by the [facility].   He was admitted in 02/2021 with new onset systolic heart failure. Echocardiogram on 02/15/21 showed EF 20-25%, mild LVH, akinesis of the left ventricular, mid inferoseptal wall and anteroseptal wall.  There was akinesis of the left ventricular, entire apical segment and anterior wall.  Also showed severely dilated left atrium, mild-moderate mitral valve regurgitation, mild aortic valve regurgitation.  Underwent right/left heart catheterization on 02/16/2021 that showed 30% stenosis in the proximal RCA, 20% stenosis in first diagonal, 20% stenosis in proximal-mid circumflex.  Overall, showed minimal nonobstructive CAD with separate left coronary ostia, severe nonischemic cardiomyopathy suspected lead due to left bundle branch block.  Underwent cardiac MRI in 02/17/2021 that showed severe LV dilation with severe systolic dysfunction (EF 32%).  Also showed septal dyskinesis consistent with LBBB, and basal septa midwall late gadolinium enhancement (a scar pattern seen in nonischemic cardiomyopathies that is associated with a worse prognosis).   Since then, patient has been followed by the Advanced Heart Failure team. Repeat echocardiogram on 05/29/21 showed EF <67%, grade I diastolic dysfunction, normal RV systolic function, mildly dilated LA.   Patient was seen by Urology and felt to need  relatively urgent right nephrectomy for a large right RCCC. Patient was seen by Dr. Haroldine Laws with advanced heart failure on 05/29/21. They discussed that urology wished to proceed with resection ASAP to limit possibility of metastatic spread. As patient has severe HF, recent cardiogenic shock with persistent, severe LV dysfunction, it was know that patient would be at a very high risk for peri-operative complications and possibly death. However, Dr. Haroldine Laws believed that we had him as optimized as possible.   Patient was also seen by EP--planned to undergo CRT implant, but patient had to undergo urgent right radical nephrectomy. Given the risk of infection with GU surgery, decided to defer CRT implant until he is healed from nephrectomy. Has follow up arranged for July.   Patient was set to undergo surgery on 6/9, but was cancelled due to OR delay. Patient presented to [facility] hospital on 6/20 to undergo robot-assisted laparoscopic right radical nephrectomy.  This was successful.  Postoperatively he was utilizing low level of norepinephrine.  Cardiology was asked to consult for CHF management.   Past Medical History:  Diagnosis Date   DM (diabetes mellitus), type 2 ([place])    OSA on CPAP     Past Surgical History:  Procedure Laterality Date   RIGHT/LEFT HEART CATH AND CORONARY ANGIOGRAPHY N/A 02/16/2021   Procedure: RIGHT/LEFT HEART CATH AND CORONARY ANGIOGRAPHY;  Surgeon: Jolaine Artist, MD;  Location: [facility];  Service: Cardiovascular;  Laterality: N/A;     Home Medications:  Prior to Admission medications   Medication Sig Start Date End Date Taking? Authorizing Provider  aspirin EC 81 MG tablet Take 81 mg by mouth daily. Swallow whole.  Yes [provider]  digoxin (LANOXIN) 0.125 MG tablet Take 1 tablet (0.125 mg total) by mouth daily. 04/02/21  Yes Bensimhon, Shaune Pascal, MD  docusate sodium (COLACE) 100 MG capsule Take 100 mg by mouth daily as needed for mild  constipation.   Yes [provider]  empagliflozin (JARDIANCE) 10 MG TABS tablet Take 1 tablet (10 mg total) by mouth daily. 04/02/21  Yes Bensimhon, Shaune Pascal, MD  HYDROcodone-acetaminophen (NORCO) 5-325 MG tablet Take 1-2 tablets by mouth every 6 (six) hours as needed for moderate pain or severe pain. 06/23/21  Yes Dancy, Estill Bamberg, PA-C  Multiple Vitamins-Minerals (MULTIVITAMIN WITH MINERALS) tablet Take 1 tablet by mouth daily.   Yes [provider]  Omega-3 Fatty Acids (FISH OIL) 1000 MG CAPS Take 1,000 mg by mouth daily.   Yes [provider]  potassium chloride (KLOR-CON M) 10 MEQ tablet Take 1 tablet (10 mEq total) by mouth daily. 05/27/21  Yes Bensimhon, Shaune Pascal, MD  PREVIDENT 5000 ENAMEL PROTECT 1.1-5 % GEL Place 1 application. onto teeth at bedtime. 01/19/21  Yes [provider]  Probiotic Product (PROBIOTIC DAILY PO) Take 1 capsule by mouth daily.   Yes [provider]  promethazine (PHENERGAN) 12.5 MG tablet Take 1 tablet (12.5 mg total) by mouth every 4 (four) hours as needed for nausea or vomiting. 06/23/21  Yes Dancy, Estill Bamberg, PA-C  rosuvastatin (CRESTOR) 40 MG tablet Take 1 tablet (40 mg total) by mouth daily. 04/02/21  Yes Bensimhon, Shaune Pascal, MD  sacubitril-valsartan (ENTRESTO) 24-26 MG Take 1 tablet by mouth 2 (two) times daily. 04/02/21  Yes Bensimhon, Shaune Pascal, MD  spironolactone (ALDACTONE) 25 MG tablet Take 1 tablet (25 mg total) by mouth daily. 04/02/21  Yes Bensimhon, Shaune Pascal, MD  torsemide (DEMADEX) 20 MG tablet Take 1 tablet (20 mg total) by mouth daily. 04/02/21  Yes Bensimhon, Shaune Pascal, MD    Inpatient Medications: Scheduled Meds:  acetaminophen  1,000 mg Oral Q6H   digoxin  0.125 mg Oral Daily   empagliflozin  10 mg Oral Daily   HYDROmorphone       insulin aspart  0-15 Units Subcutaneous TID WC   rosuvastatin  40 mg Oral Daily   sacubitril-valsartan  1 tablet Oral BID   spironolactone  25 mg Oral Daily   torsemide  20 mg Oral  Daily   Continuous Infusions:  sodium chloride      ceFAZolin (ANCEF) IV     norepinephrine (LEVOPHED) Adult infusion 2 mcg/min (06/23/21 1202)   PRN Meds: diphenhydrAMINE **OR** diphenhydrAMINE, docusate sodium, HYDROmorphone, HYDROmorphone (DILAUDID) injection, hyoscyamine, neomycin-bacitracin-polymyxin, ondansetron, oxyCODONE  Allergies:   No Known Allergies  Social History:   Social History   Socioeconomic History   Marital status: Married    Spouse name: Not on file   Number of children: Not on file   Years of education: Not on file   Highest education level: Not on file  Occupational History   Not on file  Tobacco Use   Smoking status: Former    Types: Pipe    Quit date: 02/05/1991    Years since quitting: 30.4   Smokeless tobacco: Not on file  Substance and Sexual Activity   Alcohol use: Not on file   Drug use: Not on file   Sexual activity: Not on file  Other Topics Concern   Not on file  Social History Narrative   Not on file   Social Determinants of Health   Financial Resource Strain: Low Risk  (  04/02/2021)   Overall Financial Resource Strain (CARDIA)    Difficulty of Paying Living Expenses: Not hard at all  Food Insecurity: No Food Insecurity (04/02/2021)   Hunger Vital Sign    Worried About Running Out of Food in the Last Year: Never true    Ran Out of Food in the Last Year: Never true  Transportation Needs: No Transportation Needs (04/02/2021)   PRAPARE - Hydrologist (Medical): No    Lack of Transportation (Non-Medical): No  Physical Activity: Not on file  Stress: Not on file  Social Connections: Not on file  Intimate Partner Violence: Not on file    Family History:    Family History  Problem Relation Age of Onset   Heart attack Father      ROS:  Please see the history of present illness.   All other ROS reviewed and negative.     Physical Exam/Data:   Vitals:   06/23/21 1235 06/23/21 1240 06/23/21 1245  06/23/21 1300  BP: 135/64 128/70 123/76 128/68  Pulse: 76 77 80 84  Resp: '19 19 20 19  '$ Temp:    (!) 97 F (36.1 C)  TempSrc:      SpO2: 98% 97% 97% 96%  Weight:      Height:        Intake/Output Summary (Last 24 hours) at 06/23/2021 1332 Last data filed at 06/23/2021 1245 Gross per 24 hour  Intake 1317 ml  Output 750 ml  Net 567 ml      06/23/2021    6:17 AM 06/22/2021    5:14 PM 06/12/2021   10:43 AM  Last 3 Weights  Weight (lbs) 213 lb 9.6 oz 213 lb 9.6 oz 215 lb 2.7 oz  Weight (kg) 96.888 kg 96.888 kg 97.6 kg     Body mass index is 30.65 kg/m.  General:  Patient seen in PACU while still sedated. Wearing [place], no acute distress  HEENT: normal Neck: no JVD Vascular: Radial pulses 2+ bilaterally Cardiac:  normal S1, S2; RRR; no murmur  Lungs:  clear to auscultation bilaterally, no wheezing, rhonchi or rales  Ext: no edema Skin: warm and dry  Neuro:  Sedated  EKG:  The EKG was personally reviewed and demonstrates: normal sinus rhythm with LBBB present   Telemetry:  Telemetry was personally reviewed and demonstrates:  Very frequent PVCs, occasional episodes of 3-10 beat runs of NSVT   Relevant CV Studies:   Laboratory Data:  High Sensitivity Troponin:  No results for input(s): "TROPONINIHS" in the last 720 hours.   Chemistry Recent Labs  Lab 06/22/21 1832 06/23/21 0847 06/23/21 1118  NA 137 137 140  K 4.3 3.7 3.7  CL 105  --   --   CO2 24  --   --   GLUCOSE 124*  --   --   BUN 43*  --   --   CREATININE 1.94*  --   --   CALCIUM 9.5  --   --   MG 2.4  --   --   GFRNONAA 36*  --   --   ANIONGAP 8  --   --     No results for input(s): "PROT", "ALBUMIN", "AST", "ALT", "ALKPHOS", "BILITOT" in the last 168 hours. Lipids No results for input(s): "CHOL", "TRIG", "HDL", "LABVLDL", "LDLCALC", "CHOLHDL" in the last 168 hours.  Hematology Recent Labs  Lab 06/22/21 1832 06/23/21 0847 06/23/21 1118 06/23/21 1156  WBC 4.9  --   --   --  RBC 3.96*  --   --   --    HGB 11.5* 11.6* 11.9* 12.1*  HCT 33.2* 34.0* 35.0* 34.9*  MCV 83.8  --   --   --   MCH 29.0  --   --   --   MCHC 34.6  --   --   --   RDW 15.1  --   --   --   PLT 159  --   --   --    Thyroid No results for input(s): "TSH", "FREET4" in the last 168 hours.  BNPNo results for input(s): "BNP", "PROBNP" in the last 168 hours.  DDimer No results for input(s): "DDIMER" in the last 168 hours.   Radiology/Studies:  No results found.   Assessment and Plan:   Chronic systolic heart failure  Nonischemic cardiomyopathy  - First diagnosed with systolic CHF in 05/6385. R/L heart cath at that time showed minimal, non-obstructive CAD. Suspect NICM could be due to LBBB - Most recent echocardiogram completed 05/29/21 showed EF <20%, severely dilated LV, grade I diastolic dysfunction - Home regiment includes: Digoxin 0.125 mg daily, jardiance 10 mg daily, entresto 26-26 mg BID, spironolactone 25 mg daily, torsemide 20 mg daily  - Patient did have a central venous catheter inserted prior to surgery. Also given norepinephrine throughout the surgery  for BP support  - For now, as patient is in the PACU and recovering from anesthesia, OK to continue levo. BP 123/76. Suspect that when patient is transitioned to ICU and anesthesia has worn off, he will be able to be weaned off pressors failure quickly.  - Plan to restart GDMT when in ICU-- may need to hold entresto and spiro until BP stable off pressors  - Monitor patient closely for signs of volume overload after surgery (did receive some IV fluids, but not much). For now, appears euvolemic on exam  NSVT  Frequent PVCs  - Telemetry showed frequent PVCs, episodes of 3-10 beat runs of NSVT  - Patient is at high risk for dangerous arhythmia due to severely reduced LV function. Continue cardiac monitoring while admitted  - Maintain K>4, mag >2  - Plans to follow up with EP in July for possible CRT implant - Patient appears to ha been offered LifeVest upon  discharge in 02/2021-- appears to have declined. Will discuss with MD, but given NSVT and very frequent PVCs, suspect that we should attempt to convince him to wear LifeVest going home.    Risk Assessment/Risk Scores:      New York Heart Association (NYHA) Functional Class NYHA Class IV        For questions or updates, please contact [facility] Please consult [email or web address] for contact info under    Signed, Candee Furbish, MD  06/23/2021 1:32 PM  Personally seen and examined. Agree with above.  Postop nephrectomy.  Known EF <20%, left bundle branch block.  Patient of Dr. Clayborne Dana.  Needed pressure support immediately postoperative.  Currently on norepinephrine 2 mcg.  Pressures via A-line are in the 130s.  We will go ahead and discontinue norepinephrine.  Agree with plan to hold Entresto 24/26, spironolactone. We will give digoxin.  Tomorrow morning we will likely resume some of his heart failure medications.  It would not be unreasonable to do this gently over time.  Frequent PVCs.  Brief runs of NSVT.  This is normal for him.  Plan is for follow-up with EP in July for possible CRT implant.  Did not wish to have  LifeVest upon discharge in February 2023.    Currently comfortable, no crackles heard on exam.  Occasional ectopy heard on exam.  Family member in room.  His brother is an oncologist.  CRITICAL CARE Performed by: Candee Furbish   Total critical care time: 40 minutes  Critical care time was exclusive of separately billable procedures and treating other patients.  Critical care was necessary to treat or prevent imminent or life-threatening deterioration.  Critical care was time spent personally by me on the following activities: development of treatment plan with patient and/or surrogate as well as nursing, discussions with consultants, evaluation of patient's response to treatment, examination of patient, obtaining history from patient or surrogate, ordering  and performing treatments and interventions, ordering and review of laboratory studies, ordering and review of radiographic studies, pulse oximetry and re-evaluation of patient's condition.   Candee Furbish, MD

## 2021-06-23 NOTE — Anesthesia Procedure Notes (Signed)
Arterial Line Insertion Start/End6/20/2023 7:45 AM, 06/23/2021 7:50 AM  Patient location: Pre-op. Preanesthetic checklist: patient identified, IV checked, risks and benefits discussed, surgical consent, monitors and equipment checked, pre-op evaluation and timeout performed Lidocaine 1% used for infiltration and patient sedated Right, ulnar was placed Catheter size: 20 G Hand hygiene performed  and maximum sterile barriers used   Attempts: 1 Procedure performed without using ultrasound guided technique. Following insertion, dressing applied and Biopatch. Post procedure assessment: normal  Patient tolerated the procedure well with no immediate complications. Additional procedure comments: Other attempts by another provider on left side, successful placement on right side.

## 2021-06-24 ENCOUNTER — Encounter (HOSPITAL_COMMUNITY): Payer: Self-pay | Admitting: Urology

## 2021-06-24 DIAGNOSIS — I428 Other cardiomyopathies: Secondary | ICD-10-CM | POA: Diagnosis not present

## 2021-06-24 DIAGNOSIS — I493 Ventricular premature depolarization: Secondary | ICD-10-CM | POA: Diagnosis not present

## 2021-06-24 DIAGNOSIS — I5022 Chronic systolic (congestive) heart failure: Secondary | ICD-10-CM | POA: Diagnosis not present

## 2021-06-24 LAB — BASIC METABOLIC PANEL
Anion gap: 9 (ref 5–15)
BUN: 38 mg/dL — ABNORMAL HIGH (ref 8–23)
CO2: 24 mmol/L (ref 22–32)
Calcium: 9.3 mg/dL (ref 8.9–10.3)
Chloride: 106 mmol/L (ref 98–111)
Creatinine, Ser: 2.32 mg/dL — ABNORMAL HIGH (ref 0.61–1.24)
GFR, Estimated: 29 mL/min — ABNORMAL LOW (ref >60)
Glucose, Bld: 123 mg/dL — ABNORMAL HIGH (ref 70–99)
Potassium: 4.8 mmol/L (ref 3.5–5.1)
Sodium: 139 mmol/L (ref 135–145)

## 2021-06-24 LAB — GLUCOSE, CAPILLARY
Glucose-Capillary: 111 mg/dL — ABNORMAL HIGH (ref 70–99)
Glucose-Capillary: 110 mg/dL — ABNORMAL HIGH (ref 70–99)
Glucose-Capillary: 138 mg/dL — ABNORMAL HIGH (ref 70–99)
Glucose-Capillary: 141 mg/dL — ABNORMAL HIGH (ref 70–99)

## 2021-06-24 LAB — HEMOGLOBIN AND HEMATOCRIT, BLOOD
HCT: 32.8 % — ABNORMAL LOW (ref 39.0–52.0)
Hemoglobin: 11.5 g/dL — ABNORMAL LOW (ref 13.0–17.0)

## 2021-06-24 MED ORDER — HEPARIN SODIUM (PORCINE) 5000 UNIT/ML IJ SOLN
5000.0000 [IU] | Freq: Three times a day (TID) | INTRAMUSCULAR | Status: DC
  Administered 2021-06-24 – 2021-06-26 (×5): 5000 [IU] via SUBCUTANEOUS
  Filled 2021-06-24 (×5): qty 1

## 2021-06-24 NOTE — Progress Notes (Signed)
Pharmacy Brief Note:  Patient underwent right radical nephrectomy on 6/20. Pharmacy consulted to dose heparin for VTE prophylaxis post-op.   Assessment: Patient is not on anticoagulants PTA CBC: Hgb slightly low but stable, Plt WNL on 6/19 SCr increased slightly, CrCl ~34 mL/min AET 6/20 @ 1202  Plan: Heparin 5000 units subQ q8h   Pharmacy to sign off. No dose adjustments for subQ heparin.  Lenis Noon, PharmD 06/24/21 10:17 AM

## 2021-06-24 NOTE — Progress Notes (Addendum)
Progress Note  Patient Name: Bryce Cain Date of Encounter: 06/24/2021  [facility] Cardiologist: None   Subjective   Patient has some soreness after surgery. Denies chest pain, palpitations, sob. Oxygenating well on room air. Feels like he cannot take a deep breath because he is sore from surgery. Encouraged him to take occasional deep breaths to prevent atelectasis.   Inpatient Medications    Scheduled Meds:  acetaminophen  1,000 mg Oral Q6H   Chlorhexidine Gluconate Cloth  6 each Topical Daily   digoxin  0.125 mg Oral Daily   empagliflozin  10 mg Oral Daily   heparin injection (subcutaneous)  5,000 Units Subcutaneous Q8H   insulin aspart  0-15 Units Subcutaneous TID WC   rosuvastatin  40 mg Oral Daily   Continuous Infusions:  sodium chloride Stopped (06/23/21 1638)   PRN Meds: diphenhydrAMINE **OR** diphenhydrAMINE, docusate sodium, HYDROmorphone (DILAUDID) injection, hyoscyamine, neomycin-bacitracin-polymyxin, ondansetron, mouth rinse, oxyCODONE   Vital Signs    Vitals:   06/24/21 0600 06/24/21 0700 06/24/21 0800 06/24/21 0825  BP: (!) 88/47     Pulse: 77 77 76 80  Resp: '15 15 20 '$ (!) 21  Temp:   98.3 F (36.8 C)   TempSrc:   Oral   SpO2: 98% 99% 100% 99%  Weight:      Height:        Intake/Output Summary (Last 24 hours) at 06/24/2021 1028 Last data filed at 06/24/2021 0600 Gross per 24 hour  Intake 1272.4 ml  Output 1405 ml  Net -132.6 ml      06/23/2021    1:34 PM 06/23/2021    6:17 AM 06/22/2021    5:14 PM  Last 3 Weights  Weight (lbs) 219 lb 2.2 oz 213 lb 9.6 oz 213 lb 9.6 oz  Weight (kg) 99.4 kg 96.888 kg 96.888 kg      Telemetry    Sinus rhythm, occasional PVCs - Personally Reviewed  ECG    No new tracings - Personally Reviewed  Physical Exam   GEN: No acute distress. Sitting upright in the chair Neck: No JVD Cardiac: RRR, grade 2/6 systolic murmur at LUSB  Respiratory: Clear to auscultation bilaterally. No increased WOB. On room air   GI: Soft, tender to palpation near surgical site  MS: No edema; No deformity. Neuro:  Nonfocal  Psych: Normal affect   Labs    High Sensitivity Troponin:  No results for input(s): "TROPONINIHS" in the last 720 hours.   Chemistry Recent Labs  Lab 06/22/21 1832 06/23/21 0847 06/23/21 1118 06/24/21 0209  NA 137 137 140 139  K 4.3 3.7 3.7 4.8  CL 105  --   --  106  CO2 24  --   --  24  GLUCOSE 124*  --   --  123*  BUN 43*  --   --  38*  CREATININE 1.94*  --   --  2.32*  CALCIUM 9.5  --   --  9.3  MG 2.4  --   --   --   GFRNONAA 36*  --   --  29*  ANIONGAP 8  --   --  9    Lipids No results for input(s): "CHOL", "TRIG", "HDL", "LABVLDL", "LDLCALC", "CHOLHDL" in the last 168 hours.  Hematology Recent Labs  Lab 06/22/21 1832 06/23/21 0847 06/23/21 1118 06/23/21 1156 06/24/21 0209  WBC 4.9  --   --   --   --   RBC 3.96*  --   --   --   --  HGB 11.5*   < > 11.9* 12.1* 11.5*  HCT 33.2*   < > 35.0* 34.9* 32.8*  MCV 83.8  --   --   --   --   MCH 29.0  --   --   --   --   MCHC 34.6  --   --   --   --   RDW 15.1  --   --   --   --   PLT 159  --   --   --   --    < > = values in this interval not displayed.   Thyroid No results for input(s): "TSH", "FREET4" in the last 168 hours.  BNPNo results for input(s): "BNP", "PROBNP" in the last 168 hours.  DDimer No results for input(s): "DDIMER" in the last 168 hours.   Radiology    No results found.  Cardiac Studies     Patient Profile     71 y.o. 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 Lovena Neighbours.  Assessment & Plan    Chronic systolic heart failure  Nonischemic cardiomyopathy  - First diagnosed with systolic CHF in 01/6107. R/L heart cath at that time showed minimal, non-obstructive CAD. Suspect NICM could be due to LBBB - Most recent echocardiogram completed 05/29/21 showed  EF <20%, severely dilated LV, grade I diastolic dysfunction - Home regiment includes: Digoxin 0.125 mg daily, jardiance 10 mg daily, entresto 26-26 mg BID, spironolactone 25 mg daily, torsemide 20 mg daily  - Patient did have a central venous catheter inserted prior to surgery. Also given norepinephrine throughout the surgery  for BP support. Has been off norepinephrine since yesterday at 1340  - Patient is back on home jardiance, digoxin.  - BP continues to be soft. Cannot restart home spironolactone, torsemide or entresto yet. Can attempt to slowly restart when BP is improved and stable. Also need to keep an eye on his renal function-- creatinine is increased to 2.32 today (up from 1.94). Renal function may limit GDMT  - No signs or symptoms of volume overload, continue to monitor while off torsemide    NSVT  Frequent PVCs  - Telemetry immediately after surgery showed frequent PVCs, episodes of 3-10 beat runs of NSVT. Overnight, patient only had occasional PVCs. Significantly improved  - Patient is at high risk for dangerous arhythmia due to severely reduced LV function. Continue cardiac monitoring while admitted  - Maintain K>4, mag >2  - Plans to follow up with EP in July for possible CRT implant - Patient appears to ha been offered LifeVest upon discharge in 02/2021-- appears to have declined. Will discuss with MD, may attempt to convince him to wear LifeVest going home.       For questions or updates, please contact [place] Please consult [email or web address] for contact info under        Signed, Margie Billet, PA-C  06/24/2021, 10:28 AM    Personally seen and examined. Agree with above.  71 year old with known nonischemic cardiomyopathy EF less than 20% followed by Dr. Haroldine Laws post nephrectomy.  Agree with holding Entresto, spironolactone, torsemide at this time given his relative hypotension.  His family members do state that his blood pressure is rarely above 100 at home  when he is on these medications.  He is not showing any signs of volume overload  at this time.  Creatinine has increased slightly.  2.3 up from 1.9.  Lets continue to monitor.  He does not require pressor support.  I would be comfortable discontinuing his right IJ line as well as arterial line.   Candee Furbish, MD

## 2021-06-24 NOTE — Progress Notes (Addendum)
Urology Progress Note   1 Day Post-Op from right radical nephrecotmy.   Subjective: Overnight in ICU for pressor requirement that was unable to be weaned in PACU. Successfully weaned this AM, BP on arterial line 130/90. No acute events. Hemoglobin stable from post-op check. Cr 2.3 from 1.9.  Objective: Vital signs in last 24 hours: Temp:  [95.7 F (35.4 C)-98.6 F (37 C)] 98.6 F (37 C) (06/21 0359) Pulse Rate:  [39-120] 77 (06/21 0600) Resp:  [12-24] 15 (06/21 0600) BP: (87-161)/(47-84) 88/47 (06/21 0600) SpO2:  [91 %-100 %] 98 % (06/21 0600) Arterial Line BP: (109-153)/(51-81) 109/53 (06/21 0600) Weight:  [99.4 kg] 99.4 kg (06/20 1334)  Intake/Output from previous day: 06/20 0701 - 06/21 0700 In: 1772.4 [P.O.:240; I.V.:1205.2; Blood:317; IV Piggyback:10.2] Out: 1405 [Urine:905; Blood:500] Intake/Output this shift: No intake/output data recorded.  Physical Exam:  General: Alert and oriented CV: Regular rate Lungs: No increased work of breathing Abdomen:  Soft, nontender, nondistended. Incisions clean/dry/intact, GU: Foley in place draining clear yellow urine  Ext: NT, No erythema  Lab Results: Recent Labs    06/23/21 1118 06/23/21 1156 06/24/21 0209  HGB 11.9* 12.1* 11.5*  HCT 35.0* 34.9* 32.8*   Recent Labs    06/22/21 1832 06/23/21 0847 06/23/21 1118 06/24/21 0209  NA 137   < > 140 139  K 4.3   < > 3.7 4.8  CL 105  --   --  106  CO2 24  --   --  24  GLUCOSE 124*  --   --  123*  BUN 43*  --   --  38*  CREATININE 1.94*  --   --  2.32*  CALCIUM 9.5  --   --  9.3   < > = values in this interval not displayed.    Studies/Results: No results found.  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  Overall doing well post-op.   - Continue clear liquid diet, will consider advancing later today - Daily CBC, BMP - continue IV fluid - trial of void today  Dispo: ok for floor from urology perspective  The patient was seen and examined.   I agree with the assessment and plan as outlined.     Ellison Hughs, MD    LOS: 2 days

## 2021-06-24 NOTE — Discharge Summary (Signed)
Date of admission: 06/11/2021  Date of discharge: 06/12/2021  Admission diagnosis: Right renal mass  Discharge diagnosis: Right renal mass  Secondary diagnoses: CHF  History and Physical: For full details, please see admission history and physical. Briefly, Bryce Cain is a 71 y.o. year old patient with a solid right renal mass. He was scheduled for right nephrectomy on 06/12/21, but his surgery was cancelled due to OR delay and staffing shortages.  He was discharged home and rescheduled for surgery on 06/23/21.   Hospital Course: As above  Physical Exam:  General: Alert and oriented CV: RRR, palpable distal pulses Lungs: CTAB, equal chest rise Abdomen: Soft, NTND, no rebound or guarding Ext: NT, No erythema  Laboratory values:  Recent Labs    06/23/21 1118 06/23/21 1156 06/24/21 0209  HGB 11.9* 12.1* 11.5*  HCT 35.0* 34.9* 32.8*   Recent Labs    06/22/21 1832 06/24/21 0209  CREATININE 1.94* 2.32*    Disposition: Home  Discharge instruction: The patient was instructed to be ambulatory but told to refrain from heavy lifting, strenuous activity, or driving.  Discharge medications:  Allergies as of 06/12/2021   No Known Allergies      Medication List     TAKE these medications    aspirin EC 81 MG tablet Take 81 mg by mouth daily. Swallow whole.   digoxin 0.125 MG tablet Commonly known as: LANOXIN Take 1 tablet (0.125 mg total) by mouth daily.   docusate sodium 100 MG capsule Commonly known as: COLACE Take 100 mg by mouth daily as needed for mild constipation.   empagliflozin 10 MG Tabs tablet Commonly known as: JARDIANCE Take 1 tablet (10 mg total) by mouth daily.   Fish Oil 1000 MG Caps Take 1,000 mg by mouth daily.   multivitamin with minerals tablet Take 1 tablet by mouth daily.   potassium chloride 10 MEQ tablet Commonly known as: KLOR-CON M Take 1 tablet (10 mEq total) by mouth daily.   PreviDent 5000 Enamel Protect 1.1-5 % Gel Generic drug: Sod  Fluoride-Potassium Nitrate Place 1 application. onto teeth at bedtime.   PROBIOTIC DAILY PO Take 1 capsule by mouth daily.   rosuvastatin 40 MG tablet Commonly known as: CRESTOR Take 1 tablet (40 mg total) by mouth daily.   sacubitril-valsartan 24-26 MG Commonly known as: ENTRESTO Take 1 tablet by mouth 2 (two) times daily.   spironolactone 25 MG tablet Commonly known as: ALDACTONE Take 1 tablet (25 mg total) by mouth daily.   torsemide 20 MG tablet Commonly known as: DEMADEX Take 1 tablet (20 mg total) by mouth daily.        Followup:   Follow-up Information     Ceasar Mons, MD Follow up.   Specialty: Urology Why: My office will call to reschedule your surgery Contact information: [address] [place] [address] [phone]

## 2021-06-24 NOTE — Progress Notes (Signed)
  Transition of Care [facility]) Screening Note   Patient Details  Name: Bryce Cain Date of Birth: 18-Jan-1950   [facility] CM/SW Contact:    Dessa Phi, RN Phone Number: 06/24/2021, 10:31 AM    Transition of Care Department [facility]) has reviewed patient and no TOC needs have been identified at this time. We will continue to monitor patient advancement through interdisciplinary progression rounds. If new patient transition needs arise, please place a TOC consult.

## 2021-06-25 DIAGNOSIS — I493 Ventricular premature depolarization: Secondary | ICD-10-CM | POA: Diagnosis not present

## 2021-06-25 DIAGNOSIS — I428 Other cardiomyopathies: Secondary | ICD-10-CM | POA: Diagnosis not present

## 2021-06-25 DIAGNOSIS — I5022 Chronic systolic (congestive) heart failure: Secondary | ICD-10-CM | POA: Diagnosis not present

## 2021-06-25 LAB — CBC
HCT: 29.7 % — ABNORMAL LOW (ref 39.0–52.0)
Hemoglobin: 10.5 g/dL — ABNORMAL LOW (ref 13.0–17.0)
MCH: 29.7 pg (ref 26.0–34.0)
MCHC: 35.4 g/dL (ref 30.0–36.0)
MCV: 84.1 fL (ref 80.0–100.0)
Platelets: 128 10*3/uL — ABNORMAL LOW (ref 150–400)
RBC: 3.53 MIL/uL — ABNORMAL LOW (ref 4.22–5.81)
RDW: 15.3 % (ref 11.5–15.5)
WBC: 9 10*3/uL (ref 4.0–10.5)
nRBC: 0 % (ref 0.0–0.2)

## 2021-06-25 LAB — BASIC METABOLIC PANEL
Anion gap: 7 (ref 5–15)
BUN: 33 mg/dL — ABNORMAL HIGH (ref 8–23)
CO2: 23 mmol/L (ref 22–32)
Calcium: 9.2 mg/dL (ref 8.9–10.3)
Chloride: 106 mmol/L (ref 98–111)
Creatinine, Ser: 2.46 mg/dL — ABNORMAL HIGH (ref 0.61–1.24)
GFR, Estimated: 27 mL/min — ABNORMAL LOW (ref >60)
Glucose, Bld: 98 mg/dL (ref 70–99)
Potassium: 4.2 mmol/L (ref 3.5–5.1)
Sodium: 136 mmol/L (ref 135–145)

## 2021-06-25 LAB — GLUCOSE, CAPILLARY
Glucose-Capillary: 96 mg/dL (ref 70–99)
Glucose-Capillary: 132 mg/dL — ABNORMAL HIGH (ref 70–99)
Glucose-Capillary: 106 mg/dL — ABNORMAL HIGH (ref 70–99)
Glucose-Capillary: 95 mg/dL (ref 70–99)

## 2021-06-25 LAB — SURGICAL PATHOLOGY

## 2021-06-26 DIAGNOSIS — N2889 Other specified disorders of kidney and ureter: Secondary | ICD-10-CM | POA: Diagnosis not present

## 2021-06-26 LAB — TYPE AND SCREEN
ABO/RH(D): O POS
Antibody Screen: NEGATIVE
Unit division: 0
Unit division: 0

## 2021-06-26 LAB — BPAM RBC
Blood Product Expiration Date: 202307172359
Blood Product Expiration Date: 202307172359
ISSUE DATE / TIME: 202306200845
ISSUE DATE / TIME: 202306200845
Unit Type and Rh: 5100
Unit Type and Rh: 5100

## 2021-06-26 LAB — GLUCOSE, CAPILLARY: Glucose-Capillary: 103 mg/dL — ABNORMAL HIGH (ref 70–99)

## 2021-06-26 NOTE — Progress Notes (Signed)
Urology Inpatient Progress Report  Renal mass [N28.89]  Procedure(s): XI ROBOTIC ASSISTED LAPAROSCOPIC NEPHRECTOMY  3 Days Post-Op   Intv/Subj: No acute events overnight. Patient is without complaint. Transferred to floor Walking hallways in the afternoon with FWW, PT has not assessed yet Cardiology cleared for discharge and will gradually add CHF meds back through their clinic  Principal Problem:   Renal mass  Current Facility-Administered Medications  Medication Dose Route Frequency Provider Last Rate Last Admin   Chlorhexidine Gluconate Cloth 2 % PADS 6 each  6 each Topical Daily Winter, Christopher Aaron, MD   6 each at 06/25/21 1047   digoxin (LANOXIN) tablet 0.125 mg  0.125 mg Oral Daily Winter, Christopher Aaron, MD   0.125 mg at 06/25/21 1029   diphenhydrAMINE (BENADRYL) injection 12.5 mg  12.5 mg Intravenous Q6H PRN Harrie Foreman, PA-C       Or   diphenhydrAMINE (BENADRYL) 12.5 MG/5ML elixir 12.5 mg  12.5 mg Oral Q6H PRN Harrie Foreman, PA-C       docusate sodium (COLACE) capsule 100 mg  100 mg Oral Daily PRN Winter, Christopher Aaron, MD       empagliflozin (JARDIANCE) tablet 10 mg  10 mg Oral Daily Winter, Christopher Aaron, MD   10 mg at 06/25/21 1029   heparin injection 5,000 Units  5,000 Units Subcutaneous Q8H Cindi Carbon, RPH   5,000 Units at 06/26/21 0550   HYDROmorphone (DILAUDID) injection 0.5-1 mg  0.5-1 mg Intravenous Q2H PRN Harrie Foreman, PA-C   1 mg at 06/23/21 2036   hyoscyamine (LEVSIN SL) SL tablet 0.125 mg  0.125 mg Sublingual Q4H PRN Harrie Foreman, PA-C       insulin aspart (novoLOG) injection 0-15 Units  0-15 Units Subcutaneous TID WC Harrie Foreman, PA-C   2 Units at 06/23/21 1648   neomycin-bacitracin-polymyxin [phone] OINT 1 Application  1 Application Topical TID PRN Harrie Foreman, PA-C       ondansetron (ZOFRAN) injection 4 mg  4 mg Intravenous Q4H PRN Harrie Foreman, PA-C   4 mg at 06/23/21 2216   Oral care mouth rinse  15 mL Mouth Rinse PRN  Rene Paci, MD       oxyCODONE (Oxy IR/ROXICODONE) immediate release tablet 5 mg  5 mg Oral Q4H PRN Harrie Foreman, PA-C   5 mg at 06/25/21 2355   rosuvastatin (CRESTOR) tablet 40 mg  40 mg Oral Daily Rene Paci, MD   40 mg at 06/25/21 1029     Objective: Vital: Vitals:   06/25/21 1718 06/25/21 2120 06/26/21 0229 06/26/21 0500  BP: 106/69 115/77 109/65 106/70  Pulse: 97 87 82 88  Resp: (!) 22 18 19 19   Temp: 98.4 F (36.9 C) 98.3 F (36.8 C) 98.3 F (36.8 C) 98.4 F (36.9 C)  TempSrc: Oral Oral Oral Oral  SpO2: 98% 100% 99% 98%  Weight:      Height:       I/Os: I/O last 3 completed shifts: In: 1333 [P.O.:360; I.V.:973] Out: 950 [Urine:950]  Physical Exam:  General: Patient is in no apparent distress Lungs: Normal respiratory effort, chest expands symmetrically. GI: The abdomen is soft and nontender without mass. Foley: out  Ext: lower extremities symmetric  Lab Results: Recent Labs    06/23/21 1156 06/24/21 0209 06/25/21 0252  WBC  --   --  9.0  HGB 12.1* 11.5* 10.5*  HCT 34.9* 32.8* 29.7*   Recent Labs    06/23/21 1118 06/24/21 0209 06/25/21 0248  NA 140 139  136  K 3.7 4.8 4.2  CL  --  106 106  CO2  --  24 23  GLUCOSE  --  123* 98  BUN  --  38* 33*  CREATININE  --  2.32* 2.46*  CALCIUM  --  9.3 9.2   No results for input(s): "LABPT", "INR" in the last 72 hours. No results for input(s): "LABURIN" in the last 72 hours. Results for orders placed or performed during the hospital encounter of 06/22/21  Surgical PCR screen     Status: None   Collection Time: 06/22/21  5:43 PM   Specimen: Nasal Mucosa; Nasal Swab  Result Value Ref Range Status   MRSA, PCR NEGATIVE NEGATIVE Final   Staphylococcus aureus NEGATIVE NEGATIVE Final    Comment: (NOTE) The Xpert SA Assay (FDA approved for NASAL specimens in patients 40 years of age and older), is one component of a comprehensive surveillance program. It is not intended to diagnose  infection nor to guide or monitor treatment. Performed at [facility], [address], [place], [place] [address]     Studies/Results: No results found.  Assessment: Procedure(s): XI ROBOTIC ASSISTED LAPAROSCOPIC NEPHRECTOMY, 3 Days Post-Op  doing well.  Plan: Assess for home health PT and FWW Likely home at lunch today. Appreciate cardiology's help with his medications.   Berniece Salines, MD Urology 06/26/2021, 7:35 AM

## 2021-06-29 ENCOUNTER — Telehealth: Payer: Self-pay | Admitting: Physician Assistant

## 2021-06-30 ENCOUNTER — Encounter: Payer: Self-pay | Admitting: Nurse Practitioner

## 2021-06-30 ENCOUNTER — Encounter: Payer: Medicare PPO | Admitting: Nurse Practitioner

## 2021-07-02 ENCOUNTER — Telehealth (HOSPITAL_COMMUNITY): Payer: Self-pay | Admitting: Internal Medicine

## 2021-07-03 ENCOUNTER — Encounter (HOSPITAL_COMMUNITY): Payer: Self-pay

## 2021-07-03 ENCOUNTER — Ambulatory Visit (HOSPITAL_COMMUNITY)
Admission: RE | Admit: 2021-07-03 | Discharge: 2021-07-03 | Disposition: A | Discharge status: Home / Self Care | Payer: Medicare PPO | Source: Ambulatory Visit | Attending: Family Medicine | Admitting: Family Medicine

## 2021-07-03 VITALS — BP 120/78 | HR 74 | Wt 217.0 lb

## 2021-07-03 DIAGNOSIS — N183 Chronic kidney disease, stage 3 unspecified: Secondary | ICD-10-CM | POA: Diagnosis not present

## 2021-07-03 DIAGNOSIS — I447 Left bundle-branch block, unspecified: Secondary | ICD-10-CM | POA: Diagnosis not present

## 2021-07-03 DIAGNOSIS — I493 Ventricular premature depolarization: Secondary | ICD-10-CM | POA: Diagnosis not present

## 2021-07-03 DIAGNOSIS — N1831 Chronic kidney disease, stage 3a: Secondary | ICD-10-CM | POA: Insufficient documentation

## 2021-07-03 DIAGNOSIS — I2510 Atherosclerotic heart disease of native coronary artery without angina pectoris: Secondary | ICD-10-CM | POA: Diagnosis not present

## 2021-07-03 DIAGNOSIS — G4733 Obstructive sleep apnea (adult) (pediatric): Secondary | ICD-10-CM | POA: Diagnosis not present

## 2021-07-03 DIAGNOSIS — Z905 Acquired absence of kidney: Secondary | ICD-10-CM | POA: Insufficient documentation

## 2021-07-03 DIAGNOSIS — I428 Other cardiomyopathies: Secondary | ICD-10-CM | POA: Diagnosis not present

## 2021-07-03 DIAGNOSIS — Z7984 Long term (current) use of oral hypoglycemic drugs: Secondary | ICD-10-CM | POA: Diagnosis not present

## 2021-07-03 DIAGNOSIS — I472 Ventricular tachycardia, unspecified: Secondary | ICD-10-CM | POA: Diagnosis not present

## 2021-07-03 DIAGNOSIS — C641 Malignant neoplasm of right kidney, except renal pelvis: Secondary | ICD-10-CM | POA: Diagnosis not present

## 2021-07-03 DIAGNOSIS — E1122 Type 2 diabetes mellitus with diabetic chronic kidney disease: Secondary | ICD-10-CM | POA: Diagnosis not present

## 2021-07-03 DIAGNOSIS — I130 Hypertensive heart and chronic kidney disease with heart failure and stage 1 through stage 4 chronic kidney disease, or unspecified chronic kidney disease: Secondary | ICD-10-CM | POA: Diagnosis not present

## 2021-07-03 DIAGNOSIS — I5022 Chronic systolic (congestive) heart failure: Secondary | ICD-10-CM | POA: Insufficient documentation

## 2021-07-03 DIAGNOSIS — I340 Nonrheumatic mitral (valve) insufficiency: Secondary | ICD-10-CM | POA: Diagnosis not present

## 2021-07-03 DIAGNOSIS — Z79899 Other long term (current) drug therapy: Secondary | ICD-10-CM | POA: Insufficient documentation

## 2021-07-03 LAB — BASIC METABOLIC PANEL
Anion gap: 9 (ref 5–15)
BUN: 26 mg/dL — ABNORMAL HIGH (ref 8–23)
CO2: 22 mmol/L (ref 22–32)
Calcium: 9.7 mg/dL (ref 8.9–10.3)
Chloride: 107 mmol/L (ref 98–111)
Creatinine, Ser: 2.31 mg/dL — ABNORMAL HIGH (ref 0.61–1.24)
GFR, Estimated: 29 mL/min — ABNORMAL LOW (ref >60)
Glucose, Bld: 87 mg/dL (ref 70–99)
Potassium: 4.1 mmol/L (ref 3.5–5.1)
Sodium: 138 mmol/L (ref 135–145)

## 2021-07-03 LAB — CBC
HCT: 29.2 % — ABNORMAL LOW (ref 39.0–52.0)
Hemoglobin: 10.4 g/dL — ABNORMAL LOW (ref 13.0–17.0)
MCH: 29.8 pg (ref 26.0–34.0)
MCHC: 35.6 g/dL (ref 30.0–36.0)
MCV: 83.7 fL (ref 80.0–100.0)
Platelets: 196 10*3/uL (ref 150–400)
RBC: 3.49 MIL/uL — ABNORMAL LOW (ref 4.22–5.81)
RDW: 14.7 % (ref 11.5–15.5)
WBC: 6.3 10*3/uL (ref 4.0–10.5)
nRBC: 0 % (ref 0.0–0.2)

## 2021-07-03 MED ORDER — ASPIRIN 81 MG PO TBEC: 81.0000 mg | DELAYED_RELEASE_TABLET | Freq: Every day | ORAL | 3 refills | Status: AC

## 2021-07-03 MED ORDER — POTASSIUM CHLORIDE CRYS ER 10 MEQ PO TBCR: 10.0000 meq | EXTENDED_RELEASE_TABLET | Freq: Every day | ORAL | 3 refills | Status: DC

## 2021-07-03 NOTE — Patient Instructions (Signed)
Thank you for coming in today  Labs were done today, if any labs are abnormal the clinic will call you No news is good news  RESTART ASA 81 mg 1 tablet daily RESTART Potassium 10 meq 1 tablet daily   PLEASE HOLD DIGOXIN ON YOUR NEXT APPOINTMENT YOU WILL BE HAVING LAB WORK DRAWN  Your physician recommends that you schedule a follow-up appointment in:  4 weeks in clinic Keep follow up with Dr. Haroldine Laws  At the [facility], you and your health needs are our priority. As part of our continuing mission to provide you with exceptional heart care, we have created designated Provider Care Teams. These Care Teams include your primary Cardiologist (physician) and Advanced Practice Providers (APPs- Physician Assistants and Nurse Practitioners) who all work together to provide you with the care you need, when you need it.   You may see any of the following providers on your designated Care Team at your next follow up: Dr Glori Bickers Dr Haynes Kerns, NP Lyda Jester, [place] [facility] [place], [place] Audry Riles, PharmD   Please be sure to bring in all your medications bottles to every appointment.   If you have any questions or concerns before your next appointment please send [place] a message through [place] or call our office at [phone].    TO LEAVE A MESSAGE FOR THE NURSE SELECT OPTION 2, PLEASE LEAVE A MESSAGE INCLUDING: YOUR NAME DATE OF BIRTH CALL BACK NUMBER REASON FOR CALL**this is important as we prioritize the call backs  YOU WILL RECEIVE A CALL BACK THE SAME DAY AS LONG AS YOU CALL BEFORE 4:00 PM

## 2021-07-03 NOTE — Progress Notes (Signed)
This encounter was created in error - please disregard.

## 2021-07-03 NOTE — Progress Notes (Signed)
ADVANCED HF CLINIC CONSULT NOTE   Primary Care: [name], [name], MD HF Cardiologist: Dr. 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   Admitted 2/23 with new onset systolic heart failure. Echo EF 20-25% with anterior/apical WMA. Moderate MR. RV normal.Cath with minimal nonobstructive CAD.  cMRI LVEF 16%, RVEF 52%, LGE basal septal midwall (scar pattern seen with NICM). Cardiomyopathy felt to be likely due to LBBB +/- frequent PVCs . EP consulted -> need to wait 3 months for CRT. Discharged home with LifeVest, weight 219 lbs.  Readmitted in 03/20/21 with low output HF and transient shock. Treated with milrinone and improved. Repeat echo EF 20% . Milrinone weaned off. Seen by EP regarding CRT but recommended waiting 3 months for GDMT   Renal u/s found large right renal mass and CT confirmed RCC. Discharged home  Seen in Urology Clinic and felt to need relatively urgent right nephrectomy for large right RCC.  Follow up 5/23, improved NYHA II, volume OK and BP soft but stable. Referred to EP for CRT-D.   Echo 05/29/21 EF <20% LV markedly dilated. RV normal. Mild MR.   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 Dig and Jardiance continued. Arlyce Harman, torsemide and Entresto stopped with soft BP and bump in SCr. Discharged home, weight 219 lbs.  Today he returns for post hospital HF follow up. Overall feeling fine. He is not SOB with mild activity, admits he has not been very active since discharge. No issues lifting light trash bag. Denies abnormal bleeding CP, dizziness, edema, or PND/Orthopnea. Appetite ok. No fever or chills. Weight at home 215 pounds. Taking all medications.   Cardiac Studies: - Echo (5/23): EF < 20%, LV markedly dilated, normal RV, mild MR.  - Echo (2/23): EF 20-25% with anterior/apical WMA suggestive of previous anterior MI. Moderate MR. RV normal    - R/LHC (2/23):   Prox RCA lesion is  30% stenosed.   1st Diag lesion is 20% stenosed.   Prox Cx to Mid Cx lesion is 20% stenosed.  Ao = 102/70 (85) LV = 104/24 RA =  10 RV = 47/17 PA = 52/22 (33) PCW = 25 Fick cardiac output/index = 5.5/2.5 PVR =1.5 WU Ao sat = 98% PA sat = 65%, 66%   Assessment: 1. 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 Elevated filling pressures with normal cardiac output   - cMRI (2/23): LVEF 16%, RVEF 52%, LGE basal septal midwall (scar pattern seen with NICM).   Past Medical History:  Diagnosis Date   DM (diabetes mellitus), type 2 (HCC)    OSA on CPAP    Current Outpatient Medications  Medication Sig Dispense Refill   acetaminophen (TYLENOL) 500 MG tablet Take 500 mg by mouth every 6 (six) hours as needed.     digoxin (LANOXIN) 0.125 MG tablet Take 1 tablet (0.125 mg total) by mouth daily. 90 tablet 3   docusate sodium (COLACE) 100 MG capsule Take 100 mg by mouth daily as needed for mild constipation.     empagliflozin (JARDIANCE) 10 MG TABS tablet Take 1 tablet (10 mg total) by mouth daily. 90 tablet 3   HYDROcodone-acetaminophen (NORCO) 5-325 MG tablet Take 1-2 tablets by mouth every 6 (six) hours as needed for moderate pain or severe pain. 20 tablet 0   PREVIDENT 5000 ENAMEL PROTECT 1.1-5 % GEL Place  1 application. onto teeth at bedtime.     promethazine (PHENERGAN) 12.5 MG tablet Take 1 tablet (12.5 mg total) by mouth every 4 (four) hours as needed for nausea or vomiting. 15 tablet 0   rosuvastatin (CRESTOR) 40 MG tablet Take 1 tablet (40 mg total) by mouth daily. 90 tablet 3   No current facility-administered medications for this encounter.   No Known Allergies  Social History   Socioeconomic History   Marital status: Married    Spouse name: Not on file   Number of children: Not on file   Years of education: Not on file   Highest education level: Not on file  Occupational History   Not on file  Tobacco Use    Smoking status: Former    Types: Pipe    Quit date: 02/05/1991    Years since quitting: 30.4   Smokeless tobacco: Not on file  Substance and Sexual Activity   Alcohol use: Not on file   Drug use: Not on file   Sexual activity: Not on file  Other Topics Concern   Not on file  Social History Narrative   Not on file   Social Determinants of Health   Financial Resource Strain: Low Risk  (04/02/2021)   Overall Financial Resource Strain (CARDIA)    Difficulty of Paying Living Expenses: Not hard at all  Food Insecurity: No Food Insecurity (04/02/2021)   Hunger Vital Sign    Worried About Running Out of Food in the Last Year: Never true    [place] in the Last Year: Never true  Transportation Needs: No Transportation Needs (04/02/2021)   PRAPARE - Hydrologist (Medical): No    Lack of Transportation (Non-Medical): No  Physical Activity: Not on file  Stress: Not on file  Social Connections: Not on file  Intimate Partner Violence: Not on file   Family History  Problem Relation Age of Onset   Heart attack Father    Wt Readings from Last 3 Encounters:  07/03/21 98.4 kg (217 lb)  06/30/21 99.3 kg (219 lb)  06/23/21 99.4 kg (219 lb 2.2 oz)   BP 120/78   Pulse 74   Wt 98.4 kg (217 lb)   SpO2 97%   BMI 31.14 kg/m   PHYSICAL EXAM: General:  NAD. No resp difficulty, walked into clinic HEENT: Normal Neck: Supple. No JVD. Carotids 2+ bilat; no bruits. No lymphadenopathy or thryomegaly appreciated. Cor: PMI nondisplaced. Regular rate & rhythm. No rubs, gallops or murmurs. Lungs: Clear Abdomen: Obese, soft, nontender, nondistended. No hepatosplenomegaly. No bruits or masses. Good bowel sounds. Extremities: No cyanosis, clubbing, rash, edema Neuro: Alert & oriented x 3, cranial nerves grossly intact. Moves all 4 extremities w/o difficulty. Affect pleasant.  ECG (personally reviewed): NSR 78 bpm, LBBB QRS 184 msec  ASSESSMENT & PLAN: 1. Chronic  Systolic HF - Echo 5/42 EF 20-25% with anterior/apical WMA - R/LHC: Minimal non-obstructive CAD with separate left coronary ostia, severe NICM suspect due to LBBB, and elevated filling pressures with normal cardiac output.  - cMRI: LVEF 70%, RV systolic function  62%, basal septal midwall late gadolinium enhancement (scar pattern seen in NICM and associated with worse prognosis), RV insertion LGE  - Readmit ADHF/shock 3/23. Echo EF 20% Briefly required milrinone. Seen by EP again -> wait 3 months for CRT-D - Echo (05/29/21): EF <20% LV markedly dilated. RV normal. Mild MR.  - Stable NYHA II. Volume status ok. - Torsemide, Entresto and  spiro on hold from recent admit. Plan to add back as able. - Continue Jardiance 10 mg daily.  - Continue digoxin 0.125 mg daily. - Check BMET today, Will need dig trough next visit.   2. PVCs/NSVT - He has decided not to wear LifeVest - ECG today -> no PVCs   3. DM2 - A1c 5.4% - On SGLT2i.   4. CKD 3a - Continue Jardiance. - BMET today.   5. OSA - Continue CPAP. - Follows with Dr. Maxwell Caul at [place].   6. Minimal non-obstructive CAD - On ASA/statin. Restart ASA. - No s/s angina - LDL 74.   7. LBBB  - EF remains depressed despite maximal GDMT - He has been referred to EP for CRT-D  8.  s/p R nephrectomy due to [facility] 6/23 - No issues. - labs today.  Follow up in 3-4 weeks with APP (add back GDMT as renal function allows, he will need dig trough) and keep follow up with Dr Haroldine Laws in 2 months, as scheduled.   Rafael Bihari, FNP  3:29 PM

## 2021-07-09 DIAGNOSIS — Z85528 Personal history of other malignant neoplasm of kidney: Secondary | ICD-10-CM | POA: Diagnosis not present

## 2021-07-09 DIAGNOSIS — C641 Malignant neoplasm of right kidney, except renal pelvis: Secondary | ICD-10-CM | POA: Diagnosis not present

## 2021-07-10 LAB — BASIC METABOLIC PANEL
BUN: 36 — AB (ref 4–21)
CO2: 26 — AB (ref 13–22)
Chloride: 105 (ref 99–108)
Glucose: 98
Potassium: 4.7 mEq/L (ref 3.5–5.1)
Sodium: 138 (ref 137–147)

## 2021-07-10 LAB — COMPREHENSIVE METABOLIC PANEL: Calcium: 9.7 (ref 8.7–10.7)

## 2021-07-10 LAB — CBC AND DIFFERENTIAL
HCT: 29 — AB (ref 41–53)
Hemoglobin: 10.1 — AB (ref 13.5–17.5)

## 2021-07-15 ENCOUNTER — Telehealth: Payer: Self-pay | Admitting: Oncology

## 2021-07-15 ENCOUNTER — Encounter: Payer: Self-pay | Admitting: Internal Medicine

## 2021-07-15 NOTE — Telephone Encounter (Signed)
Scheduled appt per 7/6 referral. Pt is aware of appt date and time. Pt is aware to arrive 15 mins prior to appt time and to bring and updated insurance card. Pt is aware of appt location.   

## 2021-07-19 ENCOUNTER — Encounter (HOSPITAL_COMMUNITY): Payer: Self-pay | Admitting: Emergency Medicine

## 2021-07-19 ENCOUNTER — Other Ambulatory Visit: Payer: Self-pay

## 2021-07-19 ENCOUNTER — Inpatient Hospital Stay (HOSPITAL_COMMUNITY)
Admission: EM | Admit: 2021-07-19 | Discharge: 2021-07-22 | DRG: 291 | Disposition: A | Discharge status: Home / Self Care | Payer: Medicare PPO | Attending: Internal Medicine | Admitting: Internal Medicine

## 2021-07-19 ENCOUNTER — Emergency Department (HOSPITAL_COMMUNITY): Payer: Medicare PPO

## 2021-07-19 DIAGNOSIS — I447 Left bundle-branch block, unspecified: Secondary | ICD-10-CM | POA: Diagnosis present

## 2021-07-19 DIAGNOSIS — I5023 Acute on chronic systolic (congestive) heart failure: Secondary | ICD-10-CM | POA: Diagnosis present

## 2021-07-19 DIAGNOSIS — Z85528 Personal history of other malignant neoplasm of kidney: Secondary | ICD-10-CM

## 2021-07-19 DIAGNOSIS — I509 Heart failure, unspecified: Secondary | ICD-10-CM

## 2021-07-19 DIAGNOSIS — R5383 Other fatigue: Secondary | ICD-10-CM | POA: Diagnosis present

## 2021-07-19 DIAGNOSIS — E1122 Type 2 diabetes mellitus with diabetic chronic kidney disease: Secondary | ICD-10-CM | POA: Diagnosis present

## 2021-07-19 DIAGNOSIS — I428 Other cardiomyopathies: Secondary | ICD-10-CM | POA: Diagnosis present

## 2021-07-19 DIAGNOSIS — G4733 Obstructive sleep apnea (adult) (pediatric): Secondary | ICD-10-CM | POA: Diagnosis present

## 2021-07-19 DIAGNOSIS — Z1152 Encounter for screening for COVID-19: Secondary | ICD-10-CM

## 2021-07-19 DIAGNOSIS — D631 Anemia in chronic kidney disease: Secondary | ICD-10-CM | POA: Diagnosis present

## 2021-07-19 DIAGNOSIS — I5043 Acute on chronic combined systolic (congestive) and diastolic (congestive) heart failure: Secondary | ICD-10-CM | POA: Diagnosis not present

## 2021-07-19 DIAGNOSIS — Z6830 Body mass index (BMI) 30.0-30.9, adult: Secondary | ICD-10-CM | POA: Diagnosis not present

## 2021-07-19 DIAGNOSIS — E669 Obesity, unspecified: Secondary | ICD-10-CM | POA: Diagnosis present

## 2021-07-19 DIAGNOSIS — Z79899 Other long term (current) drug therapy: Secondary | ICD-10-CM

## 2021-07-19 DIAGNOSIS — Z7984 Long term (current) use of oral hypoglycemic drugs: Secondary | ICD-10-CM | POA: Diagnosis not present

## 2021-07-19 DIAGNOSIS — I2510 Atherosclerotic heart disease of native coronary artery without angina pectoris: Secondary | ICD-10-CM | POA: Diagnosis present

## 2021-07-19 DIAGNOSIS — N1831 Chronic kidney disease, stage 3a: Secondary | ICD-10-CM | POA: Diagnosis present

## 2021-07-19 DIAGNOSIS — N179 Acute kidney failure, unspecified: Secondary | ICD-10-CM | POA: Diagnosis present

## 2021-07-19 DIAGNOSIS — I130 Hypertensive heart and chronic kidney disease with heart failure and stage 1 through stage 4 chronic kidney disease, or unspecified chronic kidney disease: Secondary | ICD-10-CM | POA: Diagnosis not present

## 2021-07-19 DIAGNOSIS — T502X5A Adverse effect of carbonic-anhydrase inhibitors, benzothiadiazides and other diuretics, initial encounter: Secondary | ICD-10-CM | POA: Diagnosis not present

## 2021-07-19 DIAGNOSIS — I472 Ventricular tachycardia, unspecified: Secondary | ICD-10-CM | POA: Diagnosis not present

## 2021-07-19 DIAGNOSIS — Z905 Acquired absence of kidney: Secondary | ICD-10-CM | POA: Diagnosis not present

## 2021-07-19 DIAGNOSIS — I110 Hypertensive heart disease with heart failure: Secondary | ICD-10-CM | POA: Diagnosis not present

## 2021-07-19 DIAGNOSIS — E876 Hypokalemia: Secondary | ICD-10-CM | POA: Diagnosis not present

## 2021-07-19 DIAGNOSIS — Z8249 Family history of ischemic heart disease and other diseases of the circulatory system: Secondary | ICD-10-CM

## 2021-07-19 DIAGNOSIS — Z7982 Long term (current) use of aspirin: Secondary | ICD-10-CM

## 2021-07-19 DIAGNOSIS — R0602 Shortness of breath: Secondary | ICD-10-CM | POA: Diagnosis not present

## 2021-07-19 HISTORY — DX: Heart failure, unspecified: I50.9

## 2021-07-19 LAB — CBC WITH DIFFERENTIAL/PLATELET
Abs Immature Granulocytes: 0.01 10*3/uL (ref 0.00–0.07)
Basophils Absolute: 0 10*3/uL (ref 0.0–0.1)
Basophils Relative: 1 %
Eosinophils Absolute: 0.1 10*3/uL (ref 0.0–0.5)
Eosinophils Relative: 2 %
HCT: 28.8 % — ABNORMAL LOW (ref 39.0–52.0)
Hemoglobin: 9.9 g/dL — ABNORMAL LOW (ref 13.0–17.0)
Immature Granulocytes: 0 %
Lymphocytes Relative: 12 %
Lymphs Abs: 0.8 10*3/uL (ref 0.7–4.0)
MCH: 28.9 pg (ref 26.0–34.0)
MCHC: 34.4 g/dL (ref 30.0–36.0)
MCV: 84.2 fL (ref 80.0–100.0)
Monocytes Absolute: 0.8 10*3/uL (ref 0.1–1.0)
Monocytes Relative: 13 %
Neutro Abs: 4.5 10*3/uL (ref 1.7–7.7)
Neutrophils Relative %: 72 %
Platelets: 175 10*3/uL (ref 150–400)
RBC: 3.42 MIL/uL — ABNORMAL LOW (ref 4.22–5.81)
RDW: 15.3 % (ref 11.5–15.5)
WBC: 6.2 10*3/uL (ref 4.0–10.5)
nRBC: 0 % (ref 0.0–0.2)

## 2021-07-19 LAB — COMPREHENSIVE METABOLIC PANEL
ALT: 12 U/L (ref 0–44)
AST: 21 U/L (ref 15–41)
Albumin: 3.4 g/dL — ABNORMAL LOW (ref 3.5–5.0)
Alkaline Phosphatase: 39 U/L (ref 38–126)
Anion gap: 10 (ref 5–15)
BUN: 24 mg/dL — ABNORMAL HIGH (ref 8–23)
CO2: 21 mmol/L — ABNORMAL LOW (ref 22–32)
Calcium: 9.7 mg/dL (ref 8.9–10.3)
Chloride: 108 mmol/L (ref 98–111)
Creatinine, Ser: 2.22 mg/dL — ABNORMAL HIGH (ref 0.61–1.24)
GFR, Estimated: 31 mL/min — ABNORMAL LOW (ref >60)
Glucose, Bld: 110 mg/dL — ABNORMAL HIGH (ref 70–99)
Potassium: 4.5 mmol/L (ref 3.5–5.1)
Sodium: 139 mmol/L (ref 135–145)
Total Bilirubin: 0.9 mg/dL (ref 0.3–1.2)
Total Protein: 6.5 g/dL (ref 6.5–8.1)

## 2021-07-19 LAB — URINALYSIS, ROUTINE W REFLEX MICROSCOPIC
Bacteria, UA: NONE SEEN
Bilirubin Urine: NEGATIVE
Glucose, UA: >=500 mg/dL — AB
Ketones, ur: NEGATIVE mg/dL
Leukocytes,Ua: NEGATIVE
Nitrite: NEGATIVE
Protein, ur: 100 mg/dL — AB
Specific Gravity, Urine: 1.022 (ref 1.005–1.030)
pH: 5 (ref 5.0–8.0)

## 2021-07-19 LAB — TROPONIN I (HIGH SENSITIVITY): Troponin I (High Sensitivity): 54 ng/L — ABNORMAL HIGH (ref <18)

## 2021-07-19 LAB — BRAIN NATRIURETIC PEPTIDE: B Natriuretic Peptide: 3130.5 pg/mL — ABNORMAL HIGH (ref 0.0–100.0)

## 2021-07-19 NOTE — ED Triage Notes (Signed)
Patient reports worsening exertional dyspnea with orthopnea and dry cough for several days , history of CHF , denies chest pain or fever.

## 2021-07-19 NOTE — ED Provider Triage Note (Signed)
Emergency Medicine Provider Triage Evaluation Note  Bryce Cain , a 71 y.o. male  was evaluated in triage.  Pt complains of shortness of breath with labored breathing at night despite wearing cpap mask.  He did have hot flash where he got sweaty.  He has had dry cough, with full loss of appetite and fatigue for this time.     Physical Exam  BP (!) 136/100   Pulse 92   Temp 98.1 F (36.7 C) (Oral)   Resp 16   SpO2 98%  Gen:   Awake, no distress   Resp:  Normal effort  MSK:   Moves extremities without difficulty  Other:  Normal speech.   Medical Decision Making  Medically screening exam initiated at 8:50 PM.  Appropriate orders placed.  [name] was informed that the remainder of the evaluation will be completed by another provider, this initial triage assessment does not replace that evaluation, and the importance of remaining in the ED until their evaluation is complete.     Lorin Glass, [place] 07/19/21 2137

## 2021-07-20 ENCOUNTER — Encounter (HOSPITAL_COMMUNITY): Payer: Self-pay

## 2021-07-20 DIAGNOSIS — Z905 Acquired absence of kidney: Secondary | ICD-10-CM | POA: Diagnosis not present

## 2021-07-20 DIAGNOSIS — I5023 Acute on chronic systolic (congestive) heart failure: Secondary | ICD-10-CM | POA: Diagnosis present

## 2021-07-20 DIAGNOSIS — I447 Left bundle-branch block, unspecified: Secondary | ICD-10-CM | POA: Diagnosis present

## 2021-07-20 DIAGNOSIS — E1122 Type 2 diabetes mellitus with diabetic chronic kidney disease: Secondary | ICD-10-CM | POA: Diagnosis present

## 2021-07-20 DIAGNOSIS — T502X5A Adverse effect of carbonic-anhydrase inhibitors, benzothiadiazides and other diuretics, initial encounter: Secondary | ICD-10-CM | POA: Diagnosis not present

## 2021-07-20 DIAGNOSIS — I130 Hypertensive heart and chronic kidney disease with heart failure and stage 1 through stage 4 chronic kidney disease, or unspecified chronic kidney disease: Secondary | ICD-10-CM | POA: Diagnosis present

## 2021-07-20 DIAGNOSIS — E669 Obesity, unspecified: Secondary | ICD-10-CM | POA: Diagnosis present

## 2021-07-20 DIAGNOSIS — Z1152 Encounter for screening for COVID-19: Secondary | ICD-10-CM | POA: Diagnosis not present

## 2021-07-20 DIAGNOSIS — I509 Heart failure, unspecified: Secondary | ICD-10-CM | POA: Diagnosis present

## 2021-07-20 DIAGNOSIS — N1831 Chronic kidney disease, stage 3a: Secondary | ICD-10-CM | POA: Diagnosis present

## 2021-07-20 DIAGNOSIS — R5383 Other fatigue: Secondary | ICD-10-CM | POA: Diagnosis present

## 2021-07-20 DIAGNOSIS — I5043 Acute on chronic combined systolic (congestive) and diastolic (congestive) heart failure: Secondary | ICD-10-CM | POA: Diagnosis not present

## 2021-07-20 DIAGNOSIS — I472 Ventricular tachycardia, unspecified: Secondary | ICD-10-CM | POA: Diagnosis not present

## 2021-07-20 DIAGNOSIS — Z7984 Long term (current) use of oral hypoglycemic drugs: Secondary | ICD-10-CM | POA: Diagnosis not present

## 2021-07-20 DIAGNOSIS — Z8249 Family history of ischemic heart disease and other diseases of the circulatory system: Secondary | ICD-10-CM | POA: Diagnosis not present

## 2021-07-20 DIAGNOSIS — Z79899 Other long term (current) drug therapy: Secondary | ICD-10-CM | POA: Diagnosis not present

## 2021-07-20 DIAGNOSIS — Z85528 Personal history of other malignant neoplasm of kidney: Secondary | ICD-10-CM | POA: Diagnosis not present

## 2021-07-20 DIAGNOSIS — I428 Other cardiomyopathies: Secondary | ICD-10-CM | POA: Diagnosis present

## 2021-07-20 DIAGNOSIS — Z6830 Body mass index (BMI) 30.0-30.9, adult: Secondary | ICD-10-CM | POA: Diagnosis not present

## 2021-07-20 DIAGNOSIS — Z7982 Long term (current) use of aspirin: Secondary | ICD-10-CM | POA: Diagnosis not present

## 2021-07-20 DIAGNOSIS — G4733 Obstructive sleep apnea (adult) (pediatric): Secondary | ICD-10-CM | POA: Diagnosis present

## 2021-07-20 DIAGNOSIS — I2510 Atherosclerotic heart disease of native coronary artery without angina pectoris: Secondary | ICD-10-CM | POA: Diagnosis present

## 2021-07-20 DIAGNOSIS — D631 Anemia in chronic kidney disease: Secondary | ICD-10-CM | POA: Diagnosis present

## 2021-07-20 DIAGNOSIS — N179 Acute kidney failure, unspecified: Secondary | ICD-10-CM | POA: Diagnosis present

## 2021-07-20 DIAGNOSIS — E876 Hypokalemia: Secondary | ICD-10-CM | POA: Diagnosis not present

## 2021-07-20 LAB — RESP PANEL BY RT-PCR (FLU A&B, COVID) ARPGX2
Influenza A by PCR: NEGATIVE
Influenza B by PCR: NEGATIVE
SARS Coronavirus 2 by RT PCR: NEGATIVE

## 2021-07-20 LAB — DIGOXIN LEVEL: Digoxin Level: 0.4 ng/mL — ABNORMAL LOW (ref 0.8–2.0)

## 2021-07-20 LAB — LACTIC ACID, PLASMA: Lactic Acid, Venous: 1.4 mmol/L (ref 0.5–1.9)

## 2021-07-20 LAB — GLUCOSE, CAPILLARY: Glucose-Capillary: 118 mg/dL — ABNORMAL HIGH (ref 70–99)

## 2021-07-20 LAB — TROPONIN I (HIGH SENSITIVITY): Troponin I (High Sensitivity): 64 ng/L — ABNORMAL HIGH (ref <18)

## 2021-07-20 MED ORDER — ONDANSETRON HCL 4 MG/2ML IJ SOLN: 4.0000 mg | Freq: Four times a day (QID) | INTRAMUSCULAR | Status: DC | PRN

## 2021-07-20 MED ORDER — SODIUM CHLORIDE 0.9% FLUSH
3.0000 mL | Freq: Two times a day (BID) | INTRAVENOUS | Status: DC
Start: 2021-07-20 — End: 2021-07-22
  Administered 2021-07-20 – 2021-07-21 (×4): 3 mL via INTRAVENOUS

## 2021-07-20 MED ORDER — SODIUM CHLORIDE 0.9 % IV SOLN: 250.0000 mL | INTRAVENOUS | Status: DC | PRN

## 2021-07-20 MED ORDER — SODIUM CHLORIDE 0.9% FLUSH: 3.0000 mL | INTRAVENOUS | Status: DC | PRN

## 2021-07-20 MED ORDER — EMPAGLIFLOZIN 10 MG PO TABS
10.0000 mg | ORAL_TABLET | Freq: Every day | ORAL | Status: DC
  Administered 2021-07-20 – 2021-07-22 (×3): 10 mg via ORAL
  Filled 2021-07-20 (×3): qty 1

## 2021-07-20 MED ORDER — POTASSIUM CHLORIDE CRYS ER 10 MEQ PO TBCR
10.0000 meq | EXTENDED_RELEASE_TABLET | Freq: Every day | ORAL | Status: DC
  Administered 2021-07-20 – 2021-07-21 (×2): 10 meq via ORAL
  Filled 2021-07-20 (×2): qty 1

## 2021-07-20 MED ORDER — ASPIRIN 81 MG PO TBEC
81.0000 mg | DELAYED_RELEASE_TABLET | Freq: Every day | ORAL | Status: DC
  Administered 2021-07-20 – 2021-07-22 (×3): 81 mg via ORAL
  Filled 2021-07-20 (×3): qty 1

## 2021-07-20 MED ORDER — ISOSORB DINITRATE-HYDRALAZINE 20-37.5 MG PO TABS
1.0000 | ORAL_TABLET | Freq: Three times a day (TID) | ORAL | Status: DC
  Administered 2021-07-20 – 2021-07-22 (×7): 1 via ORAL
  Filled 2021-07-20 (×9): qty 1

## 2021-07-20 MED ORDER — ACETAMINOPHEN 500 MG PO TABS: 500.0000 mg | ORAL_TABLET | Freq: Four times a day (QID) | ORAL | Status: DC | PRN

## 2021-07-20 MED ORDER — ROSUVASTATIN CALCIUM 20 MG PO TABS
40.0000 mg | ORAL_TABLET | Freq: Every day | ORAL | Status: DC
  Administered 2021-07-20 – 2021-07-21 (×2): 40 mg via ORAL
  Filled 2021-07-20 (×2): qty 2

## 2021-07-20 MED ORDER — FUROSEMIDE 10 MG/ML IJ SOLN
80.0000 mg | Freq: Once | INTRAMUSCULAR | Status: AC
  Administered 2021-07-20: 80 mg via INTRAVENOUS
  Filled 2021-07-20: qty 8

## 2021-07-20 MED ORDER — HEPARIN SODIUM (PORCINE) 5000 UNIT/ML IJ SOLN
5000.0000 [IU] | Freq: Three times a day (TID) | INTRAMUSCULAR | Status: DC
Start: 2021-07-20 — End: 2021-07-22
  Administered 2021-07-20 – 2021-07-22 (×7): 5000 [IU] via SUBCUTANEOUS
  Filled 2021-07-20 (×7): qty 1

## 2021-07-20 MED ORDER — FUROSEMIDE 10 MG/ML IJ SOLN
80.0000 mg | Freq: Two times a day (BID) | INTRAMUSCULAR | Status: DC
  Administered 2021-07-20 (×2): 80 mg via INTRAVENOUS
  Filled 2021-07-20 (×3): qty 8

## 2021-07-20 NOTE — H&P (Addendum)
Cardiology History & Physical    Patient ID: Bryce Cain MRN: [id number], DOB/AGE: 1950-03-30   Admit date: 07/19/2021  Primary Physician: [name], [name], MD Primary Cardiologist: None  Patient Profile    Bryce Cain is a 71 y.o. male with HFrEF due to dilated NICM (EF < 20%, normal RV), LBBB, controlled type 2 DM, OSA on CPAP, CKD 3a, and RCC s/p recent R nephrectomy (negative margins) who presents with worsening orthopnea and exertional dyspnea.  History of Present Illness    He was recently discharged following his from nephrectomy on only jardiance and digoxin due to AKI and mild hypotension post-operatively (Entresto, spiro, and torsemide held). Cr at HF follow-up was still 2.3 and he was still at his dry weight (214 lb), so these were not resumed at that time.   He was recovering well from the surgery but over the last 2-3 days has had worsening orthopnea that is now severe and exertional dyspnea. He says he can still walk up a flight of stairs and walk more than a block, but is getting more winded. Has not noted any significant swelling but has earlier satiety, less of an appetite and weight is up to 222 lb. Over these last few days he just feels more fatigued as well. He denies chest pain, dizziness, syncope.   In the ED, he is borderline tachycardic but normotensive to mildly hypertensive. Cr 2.22, stable but still not improved. Transaminases normal. BNP 3130 from around 500. Troponins 54, 64. About 3 hours after Lasix '80mg'$  he estimates he filled about 2/3 of a urinal.   Past Medical History   Past Medical History:  Diagnosis Date   CHF (congestive heart failure) (HCC)    DM (diabetes mellitus), type 2 (HCC)    OSA on CPAP     Past Surgical History:  Procedure Laterality Date   RIGHT/LEFT HEART CATH AND CORONARY ANGIOGRAPHY N/A 02/16/2021   Procedure: RIGHT/LEFT HEART CATH AND CORONARY ANGIOGRAPHY;  Surgeon: Jolaine Artist, MD;  Location: [facility];  Service: Cardiovascular;  Laterality: N/A;   ROBOT ASSISTED LAPAROSCOPIC NEPHRECTOMY Right 06/23/2021   Procedure: XI ROBOTIC ASSISTED LAPAROSCOPIC NEPHRECTOMY;  Surgeon: Ceasar Mons, MD;  Location: [facility];  Service: Urology;  Laterality: Right;     Allergies No Known Allergies  Home Medications    Prior to Admission medications   Medication Sig Start Date End Date Taking? Authorizing Provider  acetaminophen (TYLENOL) 500 MG tablet Take 500 mg by mouth every 6 (six) hours as needed.    [provider]  aspirin EC 81 MG tablet Take 1 tablet (81 mg total) by mouth daily. Swallow whole. 07/03/21   [place], Maricela Bo, FNP  digoxin (LANOXIN) 0.125 MG tablet Take 1 tablet (0.125 mg total) by mouth daily. 04/02/21   Bensimhon, Shaune Pascal, MD  docusate sodium (COLACE) 100 MG capsule Take 100 mg by mouth daily as needed for mild constipation.    [provider]  empagliflozin (JARDIANCE) 10 MG TABS tablet Take 1 tablet (10 mg total) by mouth daily. 04/02/21   Bensimhon, Shaune Pascal, MD  HYDROcodone-acetaminophen (NORCO) 5-325 MG tablet Take 1-2 tablets by mouth every 6 (six) hours as needed for moderate pain or severe pain. 06/23/21   Debbrah Alar, PA-C  potassium chloride (KLOR-CON M) 10 MEQ tablet Take 1 tablet (10 mEq total) by mouth daily. 07/03/21   Rafael Bihari, FNP  PREVIDENT 5000 ENAMEL PROTECT 1.1-5 % GEL Place 1 application.  onto teeth at bedtime. 01/19/21   [provider]  promethazine (PHENERGAN) 12.5 MG tablet Take 1 tablet (12.5 mg total) by mouth every 4 (four) hours as needed for nausea or vomiting. 06/23/21   Debbrah Alar, PA-C  rosuvastatin (CRESTOR) 40 MG tablet Take 1 tablet (40 mg total) by mouth daily. 04/02/21   Bensimhon, Shaune Pascal, MD    Family History    Family History  Problem Relation Age of Onset   Heart attack Father    He indicated that his father is deceased.   Social History    Social History   Socioeconomic History    Marital status: Married    Spouse name: Not on file   Number of children: Not on file   Years of education: Not on file   Highest education level: Not on file  Occupational History   Not on file  Tobacco Use   Smoking status: Former    Types: Pipe    Quit date: 02/05/1991    Years since quitting: 30.4   Smokeless tobacco: Not on file  Substance and Sexual Activity   Alcohol use: Not on file   Drug use: Not on file   Sexual activity: Not on file  Other Topics Concern   Not on file  Social History Narrative   Not on file   Social Determinants of Health   Financial Resource Strain: Low Risk  (04/02/2021)   Overall Financial Resource Strain (CARDIA)    Difficulty of Paying Living Expenses: Not hard at all  Food Insecurity: No Food Insecurity (04/02/2021)   Hunger Vital Sign    Worried About Running Out of Food in the Last Year: Never true    [place] in the Last Year: Never true  Transportation Needs: No Transportation Needs (04/02/2021)   PRAPARE - Hydrologist (Medical): No    Lack of Transportation (Non-Medical): No  Physical Activity: Not on file  Stress: Not on file  Social Connections: Not on file  Intimate Partner Violence: Not on file     Review of Systems    A comprehensive ROS was obtained with pertinent positive and negatives noted in the HPI.  Physical Exam    BP (!) 149/88   Pulse 92   Temp 99 F (37.2 C) (Oral)   Resp (!) 26   SpO2 95%  General: Alert, obese gentleman, in NAD HEENT: Normal  Neck: No bruits. JVP 12 cm. Lungs:  Resp regular and unlabored, CTA bilaterally. Heart: Regular rhythm, 2/6 holosystolic murmur. Abdomen: Soft, non-tender, non-distended, BS +.  Extremities: Warm. Trace LE edema. No clubbing or cyanosis. DP/PT/Radials 2+ and equal bilaterally. Psych: Normal affect. Neuro: Alert and oriented. No gross focal deficits. No abnormal movements.  Labs    Troponin (Point of Care Test) No results  for input(s): "TROPIPOC" in the last 72 hours. No results for input(s): "CKTOTAL", "CKMB", "TROPONINI" in the last 72 hours. Lab Results  Component Value Date   WBC 6.2 07/19/2021   HGB 9.9 (L) 07/19/2021   HCT 28.8 (L) 07/19/2021   MCV 84.2 07/19/2021   PLT 175 07/19/2021    Recent Labs  Lab 07/19/21 2112  NA 139  K 4.5  CL 108  CO2 21*  BUN 24*  CREATININE 2.22*  CALCIUM 9.7  PROT 6.5  BILITOT 0.9  ALKPHOS 39  ALT 12  AST 21  GLUCOSE 110*   Lab Results  Component Value Date   CHOL 137  02/15/2021   HDL 55 02/15/2021   LDLCALC 74 02/15/2021   TRIG 41 02/15/2021   No results found for: "DDIMER"   Radiology Studies    DG Chest 2 View  Result Date: 07/19/2021 CLINICAL DATA:  Shortness of breath. EXAM: CHEST - 2 VIEW COMPARISON:  Chest radiograph dated 03/20/2021. FINDINGS: Bibasilar atelectasis. Pneumonia is not excluded. No pleural effusion pneumothorax. Mild cardiomegaly. No acute osseous pathology. IMPRESSION: Bibasilar atelectasis. Pneumonia is not excluded. Electronically Signed   By: Anner Crete M.D.   On: 07/19/2021 21:39    ECG & Cardiac Imaging    ECG: sinus rhythm with occasional PVC, LBBB - personally reviewed.  All prior echocardiograms and catheterization studies reviewed.  Assessment & Plan    1. Acute on chronic HFrEF due to NICM Presents with recurrent low-output HF. BNP now up to 3130 from 500. Suspect due to recent discontinuation of diuretics and Entresto. Etiology of his CM is felt to be due to the LBBB. Echo 05/2021 - severely dilated LV with EF < 20%,  RV ok, mild AI/MR - Continue diuresis for goal of at least 2-3L negative - Seems well perfused and lactate normal, do not think he requires inotrope currently. - Will start isordil/hydralazine for afterload reduction - Hopefully renal function will improve enough to add back RASS inhibitors.  - Hold digoxin 0.125 mg pending level - CRT-D pending trial of OMT   2. AKI on CKD 3a suspect  cardiorenal - Scr 1.4-1.5 at baseline but continues to be elevated above 2 since his surgery - Renally dose medication - Optimization of HF as above  3. Microcytic anemia - Hgb 9.9 on admit - Check iron profile and consider IV iron - outpatient GI w/u pending     4. DM2 -  A1c 5.4% - continue jardiance 10 mg daily  - cover SSI   5. OSA - Continue CPAP nightly    Nutrition: heart healthy diet DVT ppx: heparin SQ Advanced Care Planning: Full Code   Signed, Marykay Lex, MD 07/20/2021, 4:04 AM

## 2021-07-20 NOTE — Progress Notes (Signed)
Visit made to patients room to set him up on CPAP for night rest.  Patient states he will wear his chin strap and his nasal cannula this time.  Advised patient since he don't want to wear our CPAP that he can bring his from home.

## 2021-07-20 NOTE — Progress Notes (Unsigned)
Reds clip 45%

## 2021-07-20 NOTE — Progress Notes (Addendum)
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  - cMRI 2/23: LVEF 50%, RV systolic function  09%, basal septal midwall late gadolinium enhancement (scar pattern seen in NICM and associated with worse prognosis), RV insertion LGE  - Readmit ADHF/shock 3/23. Echo EF 20% Briefly required milrinone. Seen by EP again -> wait 3 months for CRT-D - Echo (05/29/21): EF <20% LV markedly dilated. RV normal. Mild MR.  Bryce Cain was admitted overnight for Acute on Chronic systolic heart failure. Came in SOB, significant for orthopnea.   In the ED: BNP 3131, HsTrop 54>>64. He has received '80mg'$  lasix BID with great response. He feels much improvement.   JVD ~14. ReDS clip done at bedside 45%.  Unable to optimize GDMT d/t AKI. Cr has been >2 since R nephrectomy.   Laying in Biomedical scientist. Denies CP or SOB.   Objective:   Weight Range:   There is no height or weight on file to calculate BMI.   Vital Signs:   Temp:  [98.1 F (36.7 C)-99 F (37.2 C)] 99 F (37.2 C) (07/16 2321) Pulse Rate:  [86-98] 88 (07/17 0800) Resp:  [16-27] 19 (07/17 0800) BP: (130-149)/(79-103) 130/79 (07/17 0800) SpO2:  [92 %-99 %] 92 % (07/17 0800)    Weight change: There were no vitals filed for this visit.  Intake/Output:   Intake/Output Summary (Last 24 hours) at 07/20/2021 1002 Last data filed at 07/20/2021 0620 Gross per 24 hour  Intake 700 ml  Output 400 ml  Net 300 ml      Physical Exam    General:  pleasant, obese appearing. No respiratory difficulty HEENT: normal Neck: supple. JVD ~14 cm. Carotids 2+ bilat; no bruits. No lymphadenopathy or thyromegaly appreciated. Cor: PMI nondisplaced. Regular rate & rhythm. No rubs, gallops or murmurs. Lungs: clear,  diminished at bases Abdomen: soft, nontender, nondistended. No hepatosplenomegaly. No bruits or masses. Good bowel sounds. Extremities: no cyanosis, clubbing, rash, +1 BLE edema Neuro: alert & oriented x 3, cranial nerves grossly intact. moves all 4 extremities w/o difficulty. Affect pleasant.  Telemetry   NSR in 90's (personally reviewed)   EKG    Sinus rhythm with occasional Premature ventricular complexes 94 bpm Left bundle branch block  Labs    CBC Recent Labs    07/19/21 2112  WBC 6.2  NEUTROABS 4.5  HGB 9.9*  HCT 28.8*  MCV 84.2  PLT 381   Basic Metabolic Panel Recent Labs    07/19/21 2112  NA 139  K 4.5  CL 108  CO2 21*  GLUCOSE 110*  BUN 24*  CREATININE 2.22*  CALCIUM 9.7   Liver Function Tests Recent Labs    07/19/21 2112  AST 21  ALT 12  ALKPHOS 39  BILITOT 0.9  PROT 6.5  ALBUMIN 3.4*   No results for input(s): "LIPASE", "AMYLASE" in the last 72 hours. Cardiac Enzymes No results for input(s): "CKTOTAL", "CKMB", "CKMBINDEX", "TROPONINI" in the last 72 hours.  BNP: BNP (last 3 results) Recent Labs    04/02/21 1405 05/29/21 1447 07/19/21 2112  BNP 415.6* 514.7* 3,130.5*    ProBNP (last 3 results) No results for input(s): "PROBNP" in the last 8760 hours.   D-Dimer No results for input(s): "DDIMER" in the last 72 hours. Hemoglobin A1C No  results for input(s): "HGBA1C" in the last 72 hours. Fasting Lipid Panel No results for input(s): "CHOL", "HDL", "LDLCALC", "TRIG", "CHOLHDL", "LDLDIRECT" in the last 72 hours. Thyroid Function Tests No results for input(s): "TSH", "T4TOTAL", "T3FREE", "THYROIDAB" in the last 72 hours.  Invalid input(s): "FREET3"  Other results:  - R/LHC (2/23):   Prox RCA lesion is 30% stenosed.   1st Diag lesion is 20% stenosed.   Prox Cx to Mid Cx lesion is 20% stenosed.   Ao = 102/70 (85) LV = 104/24 RA =  10 RV = 47/17 PA = 52/22 (33) PCW = 25 Fick cardiac output/index = 5.5/2.5 PVR =1.5 WU Ao  sat = 98% PA sat = 65%, 66%  Imaging    DG Chest 2 View  Result Date: 07/19/2021 CLINICAL DATA:  Shortness of breath. EXAM: CHEST - 2 VIEW COMPARISON:  Chest radiograph dated 03/20/2021. FINDINGS: Bibasilar atelectasis. Pneumonia is not excluded. No pleural effusion pneumothorax. Mild cardiomegaly. No acute osseous pathology. IMPRESSION: Bibasilar atelectasis. Pneumonia is not excluded. Electronically Signed   By: Anner Crete M.D.   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  Assessment/Plan   1. Acute / Chronic Systolic HF - Echo 2/44 EF 20-25% with anterior/apical WMA - R/LHC: Minimal non-obstructive CAD with separate left coronary ostia, severe NICM suspect due to LBBB, and elevated filling pressures with normal cardiac output.  - cMRI 2/23: LVEF 01%, RV systolic function  02%, basal septal midwall late gadolinium enhancement (scar pattern seen in NICM and associated with worse prognosis), RV insertion LGE  - Readmit ADHF/shock 3/23. Echo EF 20% Briefly required milrinone. Seen by EP again -> wait 3 months for CRT-D - Echo (05/29/21): EF <20% LV markedly dilated. RV normal. Mild MR. - NYHA class III on admission. Volume overloaded on exam JVD ~14. ReDS clip 45% - Continue Lasix '80mg'$  BID. -1.3L clear urine at bedside - Torsemide,  Entresto, and spiro on hold. Plan to add back as able, recent Cr 2.2. - Continue Jardiance 10 mg daily.  - Continue BIDIL 20-37.5 TID - Check BMET today. - Dig level 0.4, currently held d/t AKI - Strict I&O, daily weights    2. PVCs/NSVT - He has decided not to wear LifeVest - ECG -> Sinus rhythm with occasional PVC's, LBBB - Tele at bedside NSR in 90's.    3. DM2 - A1c 5.4% - On SGLT2i. - Continue SSI   4. AKI on CKD 3a s/p R nephrectomy due to [facility] 6/23 - Cr has been >2 since surgery. Baseline around 1.4-1.5. Last 2.2 - daily BMET   5. OSA - Continue CPAP. - Follows with Dr. Maxwell Caul at [place].   6. Minimal non-obstructive CAD - On ASA/statin. Restart ASA. - LDL  74    7. LBBB  - EF remains depressed despite maximal GDMT - QRS 174 - He has been referred to EP for CRT-D   8. Anemia of chronic disease - Hgb 9.9 - Iron studied pending, consider feraheme is Tsat <20   Length of Stay: [place], [place]  07/20/2021, 10:02 AM  Advanced Heart Failure Team Pager [phone] (M-F; 7a - 5p)  Please contact [facility] for night-coverage after hours (5p -7a ) and weekends on [email or web address]    Patient seen and examined with the above-signed Advanced Practice Provider and/or Housestaff. I personally reviewed laboratory data, imaging studies and relevant notes. I independently examined the patient and formulated the important aspects of the plan. I have edited the note to reflect any of my changes or salient points. I have personally discussed the plan with the patient and/or family.  71 y/o male with systolic HF due to severe NICM EF 20-25% (thought to be d/t LBBB CM). Recently underwent resection of RCC. Lasix and Entresto stopped at d/c  Admitted last night with recurrent volume overload. Now responding to IV lasix  General:  Sitting up in bed  No resp difficulty HEENT: normal Neck: supple. JVP to jaw  Carotids 2+ bilat; no bruits. No lymphadenopathy or  thryomegaly appreciated. Cor: PMI nondisplaced. Regular rate & rhythm. No rubs, gallops or murmurs. Lungs: clear Abdomen: soft, nontender, nondistended. No hepatosplenomegaly. No bruits or masses. Good bowel sounds. Extremities: no cyanosis, clubbing, rash, 2+ edema Neuro: alert & orientedx3, cranial nerves grossly intact. moves all 4 extremities w/o difficulty. Affect pleasant  He is markedly fluid overloaded in setting of lasix being stopped post-operatively. Course c/b AKI.  Responding well to IV lasix. Will continue. Place TED hose. Follow BMET. Will need CRT soon.   Bryce Bickers, MD  5:04 PM

## 2021-07-20 NOTE — ED Provider Notes (Signed)
[facility] EMERGENCY DEPARTMENT Provider Note   CSN: [id number] Arrival date & time: 07/19/21  1951     History  Chief Complaint  Patient presents with   Shortness of Breath    Bryce Cain is a 71 y.o. male.  The history is provided by the patient, medical records and the spouse.  Shortness of Breath Bryce Cain is a 72 y.o. male who presents to the Emergency Department complaining of sob.  He presents to the ED accompanied by his wife for evaluation of progressive SOB and DOE. Sxs are worse with lying supine, present for the last several days and severely worsening.  Has associated nonproductive cough, abdominal bloating.  No fever, N/V/chest pain/abdominal pain/lower extremity edema.  Has a hx/o CHF.  Had a nephrectomy about one month ago for renal mass and had been off all of his meds due to hypotension.  He restarted his digoxin about two weeks ago but continues to not take his BP meds or diuretic per recommendations from Cardiology.  Has poor appetite and poor oral intake.      Home Medications Prior to Admission medications   Medication Sig Start Date End Date Taking? Authorizing Provider  acetaminophen (TYLENOL) 500 MG tablet Take 500 mg by mouth every 6 (six) hours as needed.    [provider]  aspirin EC 81 MG tablet Take 1 tablet (81 mg total) by mouth daily. Swallow whole. 07/03/21   Milford, Maricela Bo, FNP  digoxin (LANOXIN) 0.125 MG tablet Take 1 tablet (0.125 mg total) by mouth daily. 04/02/21   Bensimhon, Shaune Pascal, MD  docusate sodium (COLACE) 100 MG capsule Take 100 mg by mouth daily as needed for mild constipation.    [provider]  empagliflozin (JARDIANCE) 10 MG TABS tablet Take 1 tablet (10 mg total) by mouth daily. 04/02/21   Bensimhon, Shaune Pascal, MD  HYDROcodone-acetaminophen (NORCO) 5-325 MG tablet Take 1-2 tablets by mouth every 6 (six) hours as needed for moderate pain or severe pain. 06/23/21   Debbrah Alar, PA-C   potassium chloride (KLOR-CON M) 10 MEQ tablet Take 1 tablet (10 mEq total) by mouth daily. 07/03/21   Rafael Bihari, FNP  PREVIDENT 5000 ENAMEL PROTECT 1.1-5 % GEL Place 1 application. onto teeth at bedtime. 01/19/21   [provider]  promethazine (PHENERGAN) 12.5 MG tablet Take 1 tablet (12.5 mg total) by mouth every 4 (four) hours as needed for nausea or vomiting. 06/23/21   Debbrah Alar, PA-C  rosuvastatin (CRESTOR) 40 MG tablet Take 1 tablet (40 mg total) by mouth daily. 04/02/21   Bensimhon, Shaune Pascal, MD      Allergies    Patient has no known allergies.    Review of Systems   Review of Systems  Respiratory:  Positive for shortness of breath.   All other systems reviewed and are negative.   Physical Exam Updated Vital Signs BP (!) 136/103   Pulse 86   Temp 99 F (37.2 C) (Oral)   Resp (!) 26   SpO2 98%  Physical Exam Vitals and nursing note reviewed.  Constitutional:      Appearance: He is well-developed.  HENT:     Head: Normocephalic and atraumatic.  Cardiovascular:     Rate and Rhythm: Regular rhythm. Tachycardia present.     Heart sounds: No murmur heard. Pulmonary:     Effort: Pulmonary effort is normal. No respiratory distress.     Comments: Fine crackles in bilateral bases Abdominal:  Palpations: Abdomen is soft.     Tenderness: There is no abdominal tenderness. There is no guarding or rebound.     Comments: Surgical incision sites c/d/i  Musculoskeletal:        General: No tenderness.     Comments: Trace nonpitting edema to BLE  Skin:    General: Skin is warm and dry.  Neurological:     Mental Status: He is alert and oriented to person, place, and time.  Psychiatric:        Behavior: Behavior normal.     ED Results / Procedures / Treatments   Labs (all labs ordered are listed, but only abnormal results are displayed) Labs Reviewed  COMPREHENSIVE METABOLIC PANEL - Abnormal; Notable for the following components:      Result Value    CO2 21 (*)    Glucose, Bld 110 (*)    BUN 24 (*)    Creatinine, Ser 2.22 (*)    Albumin 3.4 (*)    GFR, Estimated 31 (*)    All other components within normal limits  BRAIN NATRIURETIC PEPTIDE - Abnormal; Notable for the following components:   B Natriuretic Peptide 3,130.5 (*)    All other components within normal limits  CBC WITH DIFFERENTIAL/PLATELET - Abnormal; Notable for the following components:   RBC 3.42 (*)    Hemoglobin 9.9 (*)    HCT 28.8 (*)    All other components within normal limits  URINALYSIS, ROUTINE W REFLEX MICROSCOPIC - Abnormal; Notable for the following components:   APPearance HAZY (*)    Glucose, UA >=500 (*)    Hgb urine dipstick SMALL (*)    Protein, ur 100 (*)    All other components within normal limits  DIGOXIN LEVEL - Abnormal; Notable for the following components:   Digoxin Level 0.4 (*)    All other components within normal limits  TROPONIN I (HIGH SENSITIVITY) - Abnormal; Notable for the following components:   Troponin I (High Sensitivity) 54 (*)    All other components within normal limits  TROPONIN I (HIGH SENSITIVITY) - Abnormal; Notable for the following components:   Troponin I (High Sensitivity) 64 (*)    All other components within normal limits  RESP PANEL BY RT-PCR (FLU A&B, COVID) ARPGX2  LACTIC ACID, PLASMA    EKG EKG Interpretation  Date/Time:  Sunday July 19 2021 20:28:38 EDT Ventricular Rate:  94 PR Interval:  154 QRS Duration: 174 QT Interval:  422 QTC Calculation: 527 R Axis:   90 Text Interpretation: Sinus rhythm with occasional Premature ventricular complexes Rightward axis Left bundle branch block Abnormal ECG When compared with ECG of 03-Jul-2021 15:50, PREVIOUS ECG IS PRESENT Confirmed by Quintella Reichert [phone]) on 07/20/2021 2:32:45 AM  Radiology DG Chest 2 View  Result Date: 07/19/2021 CLINICAL DATA:  Shortness of breath. EXAM: CHEST - 2 VIEW COMPARISON:  Chest radiograph dated 03/20/2021. FINDINGS: Bibasilar  atelectasis. Pneumonia is not excluded. No pleural effusion pneumothorax. Mild cardiomegaly. No acute osseous pathology. IMPRESSION: Bibasilar atelectasis. Pneumonia is not excluded. Electronically Signed   By: Anner Crete M.D.   On: 07/19/2021 21:39    Procedures Procedures    Medications Ordered in ED Medications  acetaminophen (TYLENOL) tablet 500 mg (has no administration in time range)  empagliflozin (JARDIANCE) tablet 10 mg (has no administration in time range)  rosuvastatin (CRESTOR) tablet 40 mg (has no administration in time range)  potassium chloride (KLOR-CON M) CR tablet 10 mEq (has no administration in time range)  sodium  chloride flush (NS) 0.9 % injection 3 mL (has no administration in time range)  sodium chloride flush (NS) 0.9 % injection 3 mL (has no administration in time range)  0.9 %  sodium chloride infusion (has no administration in time range)  ondansetron (ZOFRAN) injection 4 mg (has no administration in time range)  heparin injection 5,000 Units (has no administration in time range)  furosemide (LASIX) injection 80 mg (has no administration in time range)  isosorbide-hydrALAZINE (BIDIL) 20-37.5 MG per tablet 1 tablet (has no administration in time range)  furosemide (LASIX) injection 80 mg (80 mg Intravenous Given 07/20/21 0318)    ED Course/ Medical Decision Making/ A&P                           Medical Decision Making Amount and/or Complexity of Data Reviewed Labs: ordered.  Risk Prescription drug management. Decision regarding hospitalization.   Pt with hx/o RCC, CKD, CHF (EF <20% on echo 5/23) here for evaluation of progressive SOB/orthopnea one month s/p nephrectomy for renal cell carcinoma.  Wife states his blood pressures have been running low since the surgery and he was taken off his BP meds and diuretic.  He continues to take his digoxin.  Today his BP is high for him at 502D systolic.  BMP is at his baseline.  BNP is significantly elevated  compared to baseline.  Concern for acute CHF, Cardiology consulted re further mgmt.  Will start lasix for diuresis.  Pt updated of findings of studies and recommendation for admission and he is in agreement with treatment plan.         Final Clinical Impression(s) / ED Diagnoses Final diagnoses:  Acute congestive heart failure, unspecified heart failure type [facility])    Rx / DC Orders ED Discharge Orders     None         Quintella Reichert, MD 07/20/21 [phone]

## 2021-07-20 NOTE — Progress Notes (Signed)
Heart Failure Navigator Progress Note  Assessed for Heart & Vascular TOC clinic readiness.  Patient does not meet criteria due to Advanced Heart Failure Patient.    Earnestine Leys, BSN, Clinical cytogeneticist Only

## 2021-07-20 NOTE — ED Notes (Signed)
ED TO INPATIENT HANDOFF REPORT   S Name/Age/Gender Bryce Cain 71 y.o. male Room/Bed: 657Q/469G  Code Status   Code Status: Full Code  Home/SNF/Other Home Patient oriented to: self, place, time, and situation Is this baseline? Yes   Triage Complete: Triage complete  Chief Complaint Acute on chronic heart failure ([place]) [I50.9]  Triage Note Patient reports worsening exertional dyspnea with orthopnea and dry cough for several days , history of CHF , denies chest pain or fever.    Allergies No Known Allergies  Level of Care/Admitting Diagnosis ED Disposition     ED Disposition  Admit   Condition  --   Comment  Hospital Area: [facility] [[id number]]  Level of Care: Telemetry Cardiac [[id number]]  May admit patient to [facility] or [facility] if equivalent level of care is available:: No  Covid Evaluation: Asymptomatic - no recent exposure (last 10 days) testing not required  Diagnosis: Acute on chronic heart failure [facility]) [[id number]]  Admitting Physician: Jolaine Artist [[id number]]  Attending Physician: Jolaine Artist [[id number]]  Certification:: I certify this patient will need inpatient services for at least 2 midnights  Estimated Length of Stay: 3          B Medical/Surgery History Past Medical History:  Diagnosis Date   CHF (congestive heart failure) ([place])    DM (diabetes mellitus), type 2 ([place])    OSA on CPAP    Past Surgical History:  Procedure Laterality Date   RIGHT/LEFT HEART CATH AND CORONARY ANGIOGRAPHY N/A 02/16/2021   Procedure: RIGHT/LEFT HEART CATH AND CORONARY ANGIOGRAPHY;  Surgeon: Jolaine Artist, MD;  Location: [facility];  Service: Cardiovascular;  Laterality: N/A;   ROBOT ASSISTED LAPAROSCOPIC NEPHRECTOMY Right 06/23/2021   Procedure: XI ROBOTIC ASSISTED LAPAROSCOPIC NEPHRECTOMY;  Surgeon: Ceasar Mons, MD;  Location: [facility];  Service: Urology;  Laterality: Right;     A IV Location/Drains/Wounds Patient  Lines/Drains/Airways Status     Active Line/Drains/Airways     Name Placement date Placement time Site Days   Peripheral IV 07/20/21 22 G Anterior;Distal;Right;Upper Forearm 07/20/21  0318  Forearm  less than 1   Incision (Closed) 06/23/21 Abdomen Right 06/23/21  1113  -- 27   Incision - 1 Port Abdomen 1: Right;Lateral;Umbilicus 40/10/27  --  -- 27   Incision - 3 Ports Abdomen 1: Upper;Lateral;Right 2: Upper;Mid;Lateral;Right 3: Upper;Lateral;Right 06/23/21  --  -- 27            Intake/Output Last 24 hours  Intake/Output Summary (Last 24 hours) at 07/20/2021 1650 Last data filed at 07/20/2021 1521 Gross per 24 hour  Intake 700 ml  Output 3000 ml  Net -2300 ml    Labs/Imaging Results for orders placed or performed during the hospital encounter of 07/19/21 (from the past 48 hour(s))  Urinalysis, Routine w reflex microscopic Urine, Clean Catch     Status: Abnormal   Collection Time: 07/19/21  9:11 PM  Result Value Ref Range   Color, Urine YELLOW YELLOW   APPearance HAZY (A) CLEAR   Specific Gravity, Urine 1.022 1.005 - 1.030   pH 5.0 5.0 - 8.0   Glucose, UA >=500 (A) NEGATIVE mg/dL   Hgb urine dipstick SMALL (A) NEGATIVE   Bilirubin Urine NEGATIVE NEGATIVE   Ketones, ur NEGATIVE NEGATIVE mg/dL   Protein, ur 100 (A) NEGATIVE mg/dL   Nitrite NEGATIVE NEGATIVE   Leukocytes,Ua NEGATIVE NEGATIVE   RBC / HPF 0-5 0 - 5 RBC/hpf   WBC, UA  0-5 0 - 5 WBC/hpf   Bacteria, UA NONE SEEN NONE SEEN   Squamous Epithelial / LPF 0-5 0 - 5   Mucus PRESENT     Comment: Performed at [facility], [place] [address], [place], [place] [address]  Comprehensive metabolic panel     Status: Abnormal   Collection Time: 07/19/21  9:12 PM  Result Value Ref Range   Sodium 139 135 - 145 mmol/L   Potassium 4.5 3.5 - 5.1 mmol/L   Chloride 108 98 - 111 mmol/L   CO2 21 (L) 22 - 32 mmol/L   Glucose, Bld 110 (H) 70 - 99 mg/dL    Comment: Glucose reference range applies only to samples taken after  fasting for at least 8 hours.   BUN 24 (H) 8 - 23 mg/dL   Creatinine, Ser 2.22 (H) 0.61 - 1.24 mg/dL   Calcium 9.7 8.9 - 10.3 mg/dL   Total Protein 6.5 6.5 - 8.1 g/dL   Albumin 3.4 (L) 3.5 - 5.0 g/dL   AST 21 15 - 41 U/L   ALT 12 0 - 44 U/L   Alkaline Phosphatase 39 38 - 126 U/L   Total Bilirubin 0.9 0.3 - 1.2 mg/dL   GFR, Estimated 31 (L) >60 mL/min    Comment: (NOTE) Calculated using the CKD-EPI Creatinine Equation (2021)    Anion gap 10 5 - 15    Comment: Performed at [facility], [place] [address], [place], [place] [address]  Brain natriuretic peptide     Status: Abnormal   Collection Time: 07/19/21  9:12 PM  Result Value Ref Range   B Natriuretic Peptide 3,130.5 (H) 0.0 - 100.0 pg/mL    Comment: Performed at [facility] [address], [place], [place] [address]  CBC with Differential     Status: Abnormal   Collection Time: 07/19/21  9:12 PM  Result Value Ref Range   WBC 6.2 4.0 - 10.5 K/uL   RBC 3.42 (L) 4.22 - 5.81 MIL/uL   Hemoglobin 9.9 (L) 13.0 - 17.0 g/dL   HCT 28.8 (L) 39.0 - 52.0 %   MCV 84.2 80.0 - 100.0 fL   MCH 28.9 26.0 - 34.0 pg   MCHC 34.4 30.0 - 36.0 g/dL   RDW 15.3 11.5 - 15.5 %   Platelets 175 150 - 400 K/uL   nRBC 0.0 0.0 - 0.2 %   Neutrophils Relative % 72 %   Neutro Abs 4.5 1.7 - 7.7 K/uL   Lymphocytes Relative 12 %   Lymphs Abs 0.8 0.7 - 4.0 K/uL   Monocytes Relative 13 %   Monocytes Absolute 0.8 0.1 - 1.0 K/uL   Eosinophils Relative 2 %   Eosinophils Absolute 0.1 0.0 - 0.5 K/uL   Basophils Relative 1 %   Basophils Absolute 0.0 0.0 - 0.1 K/uL   Immature Granulocytes 0 %   Abs Immature Granulocytes 0.01 0.00 - 0.07 K/uL    Comment: Performed at [facility] [address], [place],  [address]  Troponin I (High Sensitivity)     Status: Abnormal   Collection Time: 07/19/21  9:12 PM  Result Value Ref Range   Troponin I (High Sensitivity) 54 (H) <18 ng/L    Comment: (NOTE) Elevated high sensitivity troponin I (hsTnI)  values and significant  changes across serial measurements may suggest ACS but many other  chronic and acute conditions are known to elevate hsTnI results.  Refer to the "Links" section for  chest pain algorithms and additional  guidance. Performed at [facility], [place] [address], [place], [place] [address]   Troponin I (High Sensitivity)     Status: Abnormal   Collection Time: 07/20/21  3:00 AM  Result Value Ref Range   Troponin I (High Sensitivity) 64 (H) <18 ng/L    Comment: (NOTE) Elevated high sensitivity troponin I (hsTnI) values and significant  changes across serial measurements may suggest ACS but many other  chronic and acute conditions are known to elevate hsTnI results.  Refer to the "Links" section for chest pain algorithms and additional  guidance. Performed at [facility], [place] [address], [place], [place] [address]   Digoxin level     Status: Abnormal   Collection Time: 07/20/21  3:00 AM  Result Value Ref Range   Digoxin Level 0.4 (L) 0.8 - 2.0 ng/mL    Comment: Performed at [facility], [place] [address] [place], [place] [address]  Resp Panel by RT-PCR (Flu A&B, Covid) Anterior Nasal Swab     Status: None   Collection Time: 07/20/21  3:00 AM   Specimen: Anterior Nasal Swab  Result Value Ref Range   SARS Coronavirus 2 by RT PCR NEGATIVE NEGATIVE    Comment: (NOTE) SARS-CoV-2 target nucleic acids are NOT DETECTED.  The SARS-CoV-2 RNA is generally detectable in upper respiratory specimens during the acute phase of infection. The lowest concentration of SARS-CoV-2 viral copies this assay can detect is 138 copies/mL. A negative result does not preclude SARS-Cov-2 infection and should not be used as the sole basis for treatment or other patient management decisions. A negative result may occur with  improper specimen collection/handling, submission of specimen other than nasopharyngeal swab, presence of viral mutation(s) within the areas targeted  by this assay, and inadequate number of viral copies(<138 copies/mL). A negative result must be combined with clinical observations, patient history, and epidemiological information. The expected result is Negative.  Fact Sheet for Patients:  EntrepreneurPulse.com.au  Fact Sheet for Healthcare Providers:  IncredibleEmployment.be  This test is no t yet approved or cleared by the [place] FDA and  has been authorized for detection and/or diagnosis of SARS-CoV-2 by FDA under an Emergency Use Authorization (EUA). This EUA will remain  in effect (meaning this test can be used) for the duration of the COVID-19 declaration under Section 564(b)(1) of the Act, 21 U.S.C.section 360bbb-3(b)(1), unless the authorization is terminated  or revoked sooner.       Influenza A by PCR NEGATIVE NEGATIVE   Influenza B by PCR NEGATIVE NEGATIVE    Comment: (NOTE) The Xpert Xpress SARS-CoV-2/FLU/RSV plus assay is intended as an aid in the diagnosis of influenza from Nasopharyngeal swab specimens and should not be used as a sole basis for treatment. Nasal washings and aspirates are unacceptable for Xpert Xpress SARS-CoV-2/FLU/RSV testing.  Fact Sheet for Patients: EntrepreneurPulse.com.au  Fact Sheet for Healthcare Providers: IncredibleEmployment.be  This test is not yet approved or cleared by the [place] FDA and has been authorized for detection and/or diagnosis of SARS-CoV-2 by FDA under an Emergency Use Authorization (EUA). This EUA will remain in effect (meaning this test can be used) for the duration of the COVID-19 declaration under Section 564(b)(1) of the Act, 21 U.S.C. section 360bbb-3(b)(1), unless the authorization is terminated or revoked.  Performed at [facility], [place] [address], [place], [place] [address]   Lactic acid, plasma     Status: None   Collection Time:  07/20/21  3:13 AM  Result  Value Ref Range   Lactic Acid, Venous 1.4 0.5 - 1.9 mmol/L    Comment: Performed at [facility] [address] [place], [place] [address]   DG Chest 2 View  Result Date: 07/19/2021 CLINICAL DATA:  Shortness of breath. EXAM: CHEST - 2 VIEW COMPARISON:  Chest radiograph dated 03/20/2021. FINDINGS: Bibasilar atelectasis. Pneumonia is not excluded. No pleural effusion pneumothorax. Mild cardiomegaly. No acute osseous pathology. IMPRESSION: Bibasilar atelectasis. Pneumonia is not excluded. Electronically Signed   By: Anner Crete M.D.   On: 07/19/2021 21:39    Pending Labs Unresulted Labs (From admission, onward)     Start     Ordered   07/21/21 1655  Basic metabolic panel  Daily at 5am,   R     Comments: As Scheduled for 5 days    07/20/21 0629   07/21/21 0500  Magnesium  Daily at 5am,   R      07/20/21 0629   07/21/21 0500  Vitamin B12  (Anemia Panel (PNL))  Tomorrow morning,   R        07/20/21 1021   07/21/21 0500  Folate  (Anemia Panel (PNL))  Tomorrow morning,   R        07/20/21 1021   07/21/21 0500  Iron and TIBC  (Anemia Panel (PNL))  Tomorrow morning,   R        07/20/21 1021   07/21/21 0500  Ferritin  (Anemia Panel (PNL))  Tomorrow morning,   R        07/20/21 1021   07/21/21 0500  Reticulocytes  (Anemia Panel (PNL))  Tomorrow morning,   R        07/20/21 1021            Vitals/Pain Today's Vitals   07/20/21 1330 07/20/21 1400 07/20/21 1401 07/20/21 1452  BP:  125/85    Pulse: 82 78    Resp: (!) 21 (!) 27    Temp:    (!) 97.5 F (36.4 C)  TempSrc:    Temporal  SpO2: 92% 96%    PainSc:   Asleep     Isolation Precautions No active isolations  Medications Medications  acetaminophen (TYLENOL) tablet 500 mg (has no administration in time range)  empagliflozin (JARDIANCE) tablet 10 mg (10 mg Oral Given 07/20/21 1026)  rosuvastatin (CRESTOR) tablet 40 mg (40 mg Oral Given 07/20/21 0929)  potassium chloride (KLOR-CON M) CR tablet 10 mEq (10 mEq Oral  Given 07/20/21 0930)  sodium chloride flush (NS) 0.9 % injection 3 mL (3 mLs Intravenous Given 07/20/21 0930)  sodium chloride flush (NS) 0.9 % injection 3 mL (has no administration in time range)  0.9 %  sodium chloride infusion (has no administration in time range)  ondansetron (ZOFRAN) injection 4 mg (has no administration in time range)  heparin injection 5,000 Units (5,000 Units Subcutaneous Given 07/20/21 1519)  furosemide (LASIX) injection 80 mg (80 mg Intravenous Given 07/20/21 0930)  isosorbide-hydrALAZINE (BIDIL) 20-37.5 MG per tablet 1 tablet (1 tablet Oral Given 07/20/21 1519)  aspirin EC tablet 81 mg (81 mg Oral Given 07/20/21 1112)  furosemide (LASIX) injection 80 mg (80 mg Intravenous Given 07/20/21 0318)    Mobility walks with device Low fall risk   Focused Assessments Pulmonary Assessment Handoff:  Lung sounds:   O2 Device: Room Air      R Recommendations: See Admitting Provider Note

## 2021-07-21 ENCOUNTER — Encounter (HOSPITAL_COMMUNITY): Payer: Self-pay

## 2021-07-21 ENCOUNTER — Telehealth (HOSPITAL_COMMUNITY): Payer: Self-pay

## 2021-07-21 ENCOUNTER — Other Ambulatory Visit (HOSPITAL_COMMUNITY): Payer: Self-pay

## 2021-07-21 DIAGNOSIS — I5023 Acute on chronic systolic (congestive) heart failure: Secondary | ICD-10-CM | POA: Diagnosis not present

## 2021-07-21 LAB — IRON AND TIBC
Iron: 32 ug/dL — ABNORMAL LOW (ref 45–182)
Saturation Ratios: 13 % — ABNORMAL LOW (ref 17.9–39.5)
TIBC: 241 ug/dL — ABNORMAL LOW (ref 250–450)
UIBC: 209 ug/dL

## 2021-07-21 LAB — BASIC METABOLIC PANEL
Anion gap: 11 (ref 5–15)
BUN: 25 mg/dL — ABNORMAL HIGH (ref 8–23)
CO2: 25 mmol/L (ref 22–32)
Calcium: 9.9 mg/dL (ref 8.9–10.3)
Chloride: 102 mmol/L (ref 98–111)
Creatinine, Ser: 2.43 mg/dL — ABNORMAL HIGH (ref 0.61–1.24)
GFR, Estimated: 27 mL/min — ABNORMAL LOW (ref >60)
Glucose, Bld: 88 mg/dL (ref 70–99)
Potassium: 3.4 mmol/L — ABNORMAL LOW (ref 3.5–5.1)
Sodium: 138 mmol/L (ref 135–145)

## 2021-07-21 LAB — FERRITIN: Ferritin: 668 ng/mL — ABNORMAL HIGH (ref 24–336)

## 2021-07-21 LAB — VITAMIN B12: Vitamin B-12: 1454 pg/mL — ABNORMAL HIGH (ref 180–914)

## 2021-07-21 LAB — RETICULOCYTES
Immature Retic Fract: 31.9 % — ABNORMAL HIGH (ref 2.3–15.9)
RBC.: 3.41 MIL/uL — ABNORMAL LOW (ref 4.22–5.81)
Retic Count, Absolute: 69.2 10*3/uL (ref 19.0–186.0)
Retic Ct Pct: 2 % (ref 0.4–3.1)

## 2021-07-21 LAB — FOLATE: Folate: 22 ng/mL (ref >5.9)

## 2021-07-21 LAB — MAGNESIUM: Magnesium: 2 mg/dL (ref 1.7–2.4)

## 2021-07-21 MED ORDER — TORSEMIDE 20 MG PO TABS
20.0000 mg | ORAL_TABLET | Freq: Every day | ORAL | Status: DC
  Administered 2021-07-21 – 2021-07-22 (×2): 20 mg via ORAL
  Filled 2021-07-21 (×2): qty 1

## 2021-07-21 MED ORDER — POTASSIUM CHLORIDE CRYS ER 20 MEQ PO TBCR
20.0000 meq | EXTENDED_RELEASE_TABLET | Freq: Once | ORAL | Status: AC
  Administered 2021-07-21: 20 meq via ORAL
  Filled 2021-07-21: qty 1

## 2021-07-21 NOTE — Discharge Summary (Incomplete)
Advanced Heart Failure Team  Discharge Summary   Patient ID: ODES LOLLI MRN: [id number], DOB/AGE: 03-25-1950 71 y.o. 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   Post nephrectomy HF meds stopped due to hypotension and AKI. Entresto, spironolactone, and torsemide held at discharge. He had f/u in HF clinic and creatinine remained elevated so meds remained on hold.   Admitted with A/C HFrEF. Diuresed with IV lasix and later transitioned to torsemide 20 mg daily. Overall diuresed 4 pounds. Renal function followed closely.  He will remain off spiro or entresto with elevated creatinine. Started on bidil for afterload reduction.   He will continue to be followed closely in the HF clinic.   See below for detailed problem list.  1. Acute / Chronic Systolic HF - Echo 9/89 EF 20-25% with anterior/apical WMA - R/LHC: Minimal non-obstructive CAD with separate left coronary ostia, severe NICM suspect due to LBBB, and elevated filling pressures with normal cardiac output.  - cMRI 2/23: LVEF 21%, RV systolic function  19%, basal septal midwall late gadolinium enhancement (scar pattern seen in NICM and associated with worse prognosis), RV insertion LGE  - Readmit ADHF/shock 3/23. Echo EF 20% Briefly required milrinone. Seen by EP again -> wait 3 months for CRT-D - Echo (05/29/21): EF <20% LV markedly dilated. RV normal. Mild MR. - NYHA class III on admission. Volume stable on exam. ReDS clip on admit 45%, today >> 39%  - Diuresed with IV lasix and transitioned to torsemide 20 mg daily . Overall diuresed 4 pounds.  Delene Loll, and spiro on hold with elevated creatinine.  - Continue Jardiance 10 mg daily.  - Continue BIDIL 20-37.5 TID--> $10.00 co pay.  - Off dig  2. PVCs/NSVT - He has decided not to wear LifeVest - Occasional PVCs.   3. DM2 - A1c 5.4% - On SGLT2i. - Continue SSI  4. AKI on CKD 3a s/p R nephrectomy due to [facility] 6/23 - Cr has been >2 since surgery. Baseline around 1.4-1.5. - Creatinine trending up  2.2>2.4>2.5 - daily BMET  5. OSA - Continue CPAP. - Follows with Dr. Maxwell Caul at [place].  6. Minimal non-obstructive CAD - Continue ASA/statin. - LDL 74   7. LBBB  - EF remains depressed despite maximal GDMT - QRS 174 - He has been referred to EP for CRT-D  8. Anemia of chronic disease - Hgb 9.9 - Iron studies: Ferritin 668, iron tSat 13%, consider Feraheme  9. Hypokalemia - K 3.5 secondary to diuresis - On KCl 18mq daily, give extra 289m today  Discharge Weight : 208 pounds.   Discharge Vitals: Blood pressure 111/79, pulse 80, temperature 98.8 F (37.1 C), temperature source Oral, resp. rate 18, weight 94.7 kg, SpO2 100 %. General:  Well appearing. No resp difficulty HEENT: normal Neck: supple. JVP 7-8. Carotids 2+ bilat; no bruits. No lymphadenopathy or thryomegaly appreciated. Cor: PMI nondisplaced. Regular rate & rhythm. No rubs, gallops or murmurs. Lungs: clear Abdomen: soft, nontender, nondistended. No hepatosplenomegaly. No bruits or masses. Good bowel sounds. Extremities: no cyanosis, clubbing, rash, edema Neuro: alert & orientedx3,  cranial nerves grossly intact. moves all 4 extremities w/o difficulty. Affect pleasant   Labs: Lab Results  Component Value Date   WBC 6.2 07/19/2021   HGB 9.9 (L) 07/19/2021   HCT 28.8 (L) 07/19/2021   MCV 84.2 07/19/2021   PLT 175 07/19/2021    Recent Labs  Lab 07/19/21 2112 07/21/21 0419 07/22/21 0336  NA 139   < > 138  K 4.5   < > 3.5  CL 108   < > 99  CO2 21*   < > 25  BUN 24*   < > 26*  CREATININE 2.22*   < >  2.53*  CALCIUM 9.7   < > 9.6  PROT 6.5  --   --   BILITOT 0.9  --   --   ALKPHOS 39  --   --   ALT 12  --   --   AST 21  --   --   GLUCOSE 110*   < > 97   < > = values in this interval not displayed.   Lab Results  Component Value Date   CHOL 137 02/15/2021   HDL 55 02/15/2021   LDLCALC 74 02/15/2021   TRIG 41 02/15/2021   BNP (last 3 results) Recent Labs    04/02/21 1405 05/29/21 1447 07/19/21 2112  BNP 415.6* 514.7* 3,130.5*    ProBNP (last 3 results) No results for input(s): "PROBNP" in the last 8760 hours.   Diagnostic Studies/Procedures   No results found.  Discharge Medications   Allergies as of 07/22/2021   No Known Allergies      Medication List     STOP taking these medications    digoxin 0.125 MG tablet Commonly known as: LANOXIN       TAKE these medications    acetaminophen 500 MG tablet Commonly known as: TYLENOL Take 500 mg by mouth every 6 (six) hours as needed.   aspirin EC 81 MG tablet Take 1 tablet (81 mg total) by mouth daily. Swallow whole.   docusate sodium 100 MG capsule Commonly known as: COLACE Take 100 mg by mouth daily as needed for mild constipation.   empagliflozin 10 MG Tabs tablet Commonly known as: JARDIANCE Take 1 tablet (10 mg total) by mouth daily.   HYDROcodone-acetaminophen 5-325 MG tablet Commonly known as: Norco Take 1-2 tablets by mouth every 6 (six) hours as needed for moderate pain or severe pain.   isosorbide-hydrALAZINE 20-37.5 MG tablet Commonly known as: BIDIL Take 1 tablet by mouth 3 (three) times daily.   multivitamin capsule Take 1 capsule by mouth daily.   potassium chloride 10 MEQ tablet Commonly known as: KLOR-CON M Take 1 tablet (10 mEq total) by mouth daily.   PreviDent 5000 Enamel Protect 1.1-5 % Gel Generic drug: Sod Fluoride-Potassium Nitrate Place 1 application. onto teeth at bedtime.   promethazine 12.5 MG tablet Commonly known as: PHENERGAN Take 1 tablet (12.5 mg total)  by mouth every 4 (four) hours as needed for nausea or vomiting.   rosuvastatin 40 MG tablet Commonly known as: CRESTOR Take 1 tablet (40 mg total) by mouth daily.   torsemide 20 MG tablet Commonly known as: DEMADEX Take 1 tablet (20 mg total) by mouth daily.        Disposition   The patient will be discharged in stable condition to home. Discharge Instructions     (HEART FAILURE PATIENTS) Call MD:  Anytime you have any of the following symptoms: 1) 3 pound weight gain in  24 hours or 5 pounds in 1 week 2) shortness of breath, with or without a dry hacking cough 3) swelling in the hands, feet or stomach 4) if you have to sleep on extra pillows at night in order to breathe.   Complete by: As directed    Diet - low sodium heart healthy   Complete by: As directed    Increase activity slowly   Complete by: As directed        Follow-up Information     [facility] Follow up on 07/30/2021.   Specialty: Cardiology Why: 07/30/2021 1200pm. [facility] APP clinic.  Please bring all current medications. Contact information: [address] [id number] [place] [phone]                  Duration of Discharge Encounter: Greater than 35 minutes   Signed, Darrick Grinder Np-c  07/22/2021, 9:28 AM  Patient seen and examined with the above-signed Advanced Practice Provider and/or Housestaff. I personally reviewed laboratory data, imaging studies and relevant notes. I independently examined the patient and formulated the important aspects of the plan. I have edited the note to reflect any of my changes or salient points. I have personally discussed the plan with the patient and/or family.  Has diuresed well. Breathing better. No orthopnea or PND. SCr stabilizing. Wants to go home  General:  Well appearing. No resp difficulty HEENT: normal Neck: supple. no JVD. Carotids 2+ bilat; no bruits. No  lymphadenopathy or thryomegaly appreciated. Cor: PMI nondisplaced. Regular rate & rhythm. No rubs, gallops or murmurs. Lungs: clear Abdomen: soft, nontender, nondistended. No hepatosplenomegaly. No bruits or masses. Good bowel sounds. Extremities: no cyanosis, clubbing, rash, edema Neuro: alert & orientedx3, cranial nerves grossly intact. moves all 4 extremities w/o difficulty. Affect pleasant  He is ok for d/c. Restart torsemide 20 daily. Can take extra as needed. 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 He has appt with EP today to discuss CRT but he doesn't think he can make it. I called Dr. Quentin Ore and we will reschedule for near future.    Glori Bickers, MD  2:34 PM

## 2021-07-21 NOTE — Telephone Encounter (Signed)
Pharmacy Patient Advocate Encounter  Insurance verification completed.    The patient is insured through [employer]   The patient is currently admitted and ran test claims for the following: Bidil.  Copays and coinsurance results were relayed to Inpatient clinical team.

## 2021-07-21 NOTE — Progress Notes (Signed)
Patient states he has tried our CPAP machine in the past but cannot tolerate our mask.  While here he wears [place] at night along with his chin strap to help prevent mouth breathing.  RT will continue to monitor.

## 2021-07-21 NOTE — Progress Notes (Unsigned)
Reds clip 39%

## 2021-07-21 NOTE — Progress Notes (Addendum)
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   Diuresing with IV lasix. ReDS clip 45% 7/17, today >>> 39%  Unable to optimize GDMT d/t AKI. Cr has been >2 since R nephrectomy today 2.43  Got in bed after morning ADL's. Feeling much improvement today. Denies CP or SOB.   Objective:   Weight Range: 96.3 kg Body mass index is 30.46 kg/m.   Vital Signs:   Temp:  [97.4 F (36.3 C)-98.9 F (37.2 C)] 98.1 F (36.7 C) (07/18 0548) Pulse Rate:  [49-95] 89 (07/18 0548) Resp:  [14-37] 16 (07/18 0548) BP: (97-130)/(67-86) 112/78 (07/18 0548) SpO2:  [92 %-98 %] 96 % (07/18 0548) Weight:  [96.3 kg] 96.3 kg (07/18 0004) Last BM Date : 07/20/21  Weight change: Filed Weights   07/21/21 0004  Weight: 96.3 kg    Intake/Output:   Intake/Output Summary (Last 24 hours) at 07/21/2021 0751 Last data filed at 07/21/2021 0500 Gross per 24 hour  Intake 480 ml  Output 5100 ml  Net -4620 ml      Physical Exam    General:  obese appearing. No respiratory difficulty HEENT: normal Neck: supple. JVD ~12 cm. Carotids 2+ bilat; no bruits. No lymphadenopathy or thyromegaly appreciated. Cor: PMI nondisplaced. Regular rate & rhythm. No rubs, gallops or murmurs. Lungs: clear Abdomen: soft, nontender, nondistended. No hepatosplenomegaly. No bruits or masses. Good bowel sounds. Extremities: no cyanosis, clubbing, rash, non-pitting BLE edema  Neuro: alert & oriented x 3, cranial nerves grossly intact. moves all 4 extremities w/o difficulty. Affect pleasant.  Telemetry   NSR in 90's (personally reviewed) . 8-10 PVC's an hour  EKG    No new EKG  Labs    CBC Recent Labs    07/19/21 2112  WBC 6.2  NEUTROABS 4.5  HGB 9.9*  HCT 28.8*  MCV 84.2  PLT 211   Basic Metabolic Panel Recent Labs    07/19/21 2112 07/21/21 0419  NA 139 138  K 4.5 3.4*  CL 108 102  CO2 21* 25   GLUCOSE 110* 88  BUN 24* 25*  CREATININE 2.22* 2.43*  CALCIUM 9.7 9.9  MG  --  2.0   Liver Function Tests Recent Labs    07/19/21 2112  AST 21  ALT 12  ALKPHOS 39  BILITOT 0.9  PROT 6.5  ALBUMIN 3.4*   No results for input(s): "LIPASE", "AMYLASE" in the last 72 hours. Cardiac Enzymes No results for input(s): "CKTOTAL", "CKMB", "CKMBINDEX", "TROPONINI" in the last 72 hours.  BNP: BNP (last 3 results) Recent Labs    04/02/21 1405 05/29/21 1447 07/19/21 2112  BNP 415.6* 514.7* 3,130.5*    ProBNP (last 3 results) No results for input(s): "PROBNP" in the last 8760 hours.   D-Dimer No results for input(s): "DDIMER" in the last 72 hours. Hemoglobin A1C No results for input(s): "HGBA1C" in the last 72 hours. Fasting Lipid Panel No results for input(s): "CHOL", "HDL", "LDLCALC", "TRIG", "CHOLHDL", "LDLDIRECT" in the last 72 hours. Thyroid Function Tests No results for input(s): "TSH", "T4TOTAL", "T3FREE", "THYROIDAB" in the last 72 hours.  Invalid input(s): "FREET3"  Other results:  - R/LHC (2/23):   Prox RCA lesion is 30% stenosed.   1st Diag lesion is 20% stenosed.   Prox Cx to Mid Cx lesion is 20% stenosed.   Ao = 102/70 (85) LV = 104/24 RA =  10 RV = 47/17  PA = 52/22 (33) PCW = 25 Fick cardiac output/index = 5.5/2.5 PVR =1.5 WU Ao sat = 98% PA sat = 65%, 66%  Imaging    No results found.   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  Assessment/Plan   1. 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  - cMRI 2/23: LVEF 23%, RV systolic function  55%, basal septal midwall late gadolinium enhancement (scar pattern seen in NICM and associated with worse prognosis), RV insertion LGE  - Readmit ADHF/shock 3/23. Echo EF 20% Briefly required milrinone. Seen by EP again -> wait 3 months for CRT-D - Echo (05/29/21): EF <20% LV markedly dilated. RV normal. Mild MR. - NYHA class III on admission. Volume stable on exam. ReDS clip on admit 45%, today >> 39% - Stop IV Lasix '80mg'$  BID. Transition back to home PO Torsemide '20mg'$  daily -4.3L / -10Lbs since admission - Entresto, and spiro on hold. Plan to add back as able, recent Cr 2.4 - Continue Jardiance 10 mg daily.  - Continue BIDIL 20-37.5 TID - Daily BMET - Dig level 0.4, currently held d/t AKI - Strict I&O, daily weights  - Place Ted hose BLE   2. PVCs/NSVT - He has decided not to wear LifeVest - ECG -> Sinus rhythm with occasional PVC's, LBBB - Tele at bedside NSR in 90's PVC's 8-10/hr   3. DM2 - A1c 5.4% - On SGLT2i. - Continue SSI   4. AKI on CKD 3a s/p R nephrectomy due to [facility] 6/23 - Cr has been >2 since surgery. Baseline around 1.4-1.5. Last 2.4 - daily BMET   5. OSA - Continue CPAP. - Follows with Dr. Maxwell Caul at [place].   6. Minimal non-obstructive CAD - Continue ASA/statin. - LDL 74    7. LBBB  - EF remains depressed despite maximal GDMT - QRS 174 - He has been referred to EP for CRT-D   8. Anemia of chronic disease - Hgb 9.9 - Iron studies: Ferritin 668, iron tSat 13%, consider Feraheme  9. Hypokalemia - K  3.4 secondary to diuresis - On KCl 32mq daily, give extra 27m today  Length of Stay: 1 [place]7/18/2023, 7:51 AM  Advanced Heart Failure Team Pager [phone]-F; 7a - 5p)  Please contact [facility] for night-coverage after hours (5p -7a ) and weekends on [email or web address]  Patient seen and examined with the above-signed Advanced Practice Provider and/or Housestaff. I personally reviewed laboratory data, imaging studies and relevant notes. I independently examined the patient and formulated  the important aspects of the plan. I have edited the note to reflect any of my changes or salient points. I have personally discussed the plan with the patient and/or family.  Diuresing well. ReDS coming down. Breathing better. No orthopnea or PND. Scr has bumped  General:  Sitting up in bed  No resp difficulty HEENT: normal Neck: supple. Jvp 8-9. Carotids 2+ bilat; no bruits. No lymphadenopathy or thryomegaly appreciated. Cor: PMI nondisplaced. Regular rate & rhythm. No rubs, gallops or murmurs. Lungs: clear Abdomen: soft, nontender, nondistended. No hepatosplenomegaly. No bruits or masses. Good bowel sounds. Extremities: no cyanosis, clubbing, rash, tr edema Neuro: alert & orientedx3, cranial nerves grossly intact. moves all 4 extremities w/o difficulty. Affect pleasant  Volume status improved. Scr up slightly. Will switch to po diuretics. Watch renal function closely. Will need CRT vs LBBB pacing soon.   Glori Bickers, MD  5:08 PM

## 2021-07-21 NOTE — Plan of Care (Signed)
  Problem: Clinical Measurements: Goal: Ability to maintain clinical measurements within normal limits will improve Outcome: Progressing   Problem: Clinical Measurements: Goal: Diagnostic test results will improve Outcome: Progressing   Problem: Clinical Measurements: Goal: Cardiovascular complication will be avoided Outcome: Progressing   

## 2021-07-21 NOTE — Plan of Care (Signed)
  Problem: Education: Goal: Knowledge of General Education information will improve Description Including pain rating scale, medication(s)/side effects and non-pharmacologic comfort measures Outcome: Adequate for Discharge   Problem: Health Behavior/Discharge Planning: Goal: Ability to manage health-related needs will improve Outcome: Adequate for Discharge   

## 2021-07-22 ENCOUNTER — Ambulatory Visit: Payer: Medicare PPO | Admitting: Cardiology

## 2021-07-22 ENCOUNTER — Other Ambulatory Visit (HOSPITAL_COMMUNITY): Payer: Self-pay

## 2021-07-22 DIAGNOSIS — I5043 Acute on chronic combined systolic (congestive) and diastolic (congestive) heart failure: Secondary | ICD-10-CM

## 2021-07-22 LAB — BASIC METABOLIC PANEL
Anion gap: 14 (ref 5–15)
BUN: 26 mg/dL — ABNORMAL HIGH (ref 8–23)
CO2: 25 mmol/L (ref 22–32)
Calcium: 9.6 mg/dL (ref 8.9–10.3)
Chloride: 99 mmol/L (ref 98–111)
Creatinine, Ser: 2.53 mg/dL — ABNORMAL HIGH (ref 0.61–1.24)
GFR, Estimated: 26 mL/min — ABNORMAL LOW (ref >60)
Glucose, Bld: 97 mg/dL (ref 70–99)
Potassium: 3.5 mmol/L (ref 3.5–5.1)
Sodium: 138 mmol/L (ref 135–145)

## 2021-07-22 LAB — MAGNESIUM: Magnesium: 1.8 mg/dL (ref 1.7–2.4)

## 2021-07-22 MED ORDER — POTASSIUM CHLORIDE CRYS ER 20 MEQ PO TBCR
20.0000 meq | EXTENDED_RELEASE_TABLET | Freq: Once | ORAL | Status: AC
Start: 2021-07-22 — End: 2021-07-22
  Administered 2021-07-22: 20 meq via ORAL
  Filled 2021-07-22: qty 1

## 2021-07-22 MED ORDER — TORSEMIDE 20 MG PO TABS
20.0000 mg | ORAL_TABLET | Freq: Every day | ORAL | 6 refills | Status: DC
  Filled 2021-07-22: qty 30, 30d supply, fill #0

## 2021-07-22 MED ORDER — ISOSORB DINITRATE-HYDRALAZINE 20-37.5 MG PO TABS
1.0000 | ORAL_TABLET | Freq: Three times a day (TID) | ORAL | 6 refills | Status: DC
Start: 2021-07-22 — End: 2022-02-26
  Filled 2021-07-22: qty 90, 30d supply, fill #0

## 2021-07-22 MED ORDER — POTASSIUM CHLORIDE CRYS ER 10 MEQ PO TBCR
10.0000 meq | EXTENDED_RELEASE_TABLET | Freq: Every day | ORAL | Status: DC
Start: 2021-07-23 — End: 2021-07-22

## 2021-07-22 NOTE — Plan of Care (Signed)
  Problem: Education: Goal: Knowledge of General Education information will improve Description: Including pain rating scale, medication(s)/side effects and non-pharmacologic comfort measures Outcome: Adequate for Discharge   Problem: Health Behavior/Discharge Planning: Goal: Ability to manage health-related needs will improve Outcome: Adequate for Discharge   Problem: Clinical Measurements: Goal: Ability to maintain clinical measurements within normal limits will improve Outcome: Adequate for Discharge Goal: Will remain free from infection Outcome: Adequate for Discharge Goal: Diagnostic test results will improve Outcome: Adequate for Discharge Goal: Respiratory complications will improve Outcome: Adequate for Discharge Goal: Cardiovascular complication will be avoided Outcome: Adequate for Discharge   Problem: Activity: Goal: Risk for activity intolerance will decrease Outcome: Adequate for Discharge   Problem: Nutrition: Goal: Adequate nutrition will be maintained Outcome: Adequate for Discharge   Problem: Coping: Goal: Level of anxiety will decrease Outcome: Adequate for Discharge   Problem: Elimination: Goal: Will not experience complications related to bowel motility Outcome: Adequate for Discharge Goal: Will not experience complications related to urinary retention Outcome: Adequate for Discharge   Problem: Pain Managment: Goal: General experience of comfort will improve Outcome: Adequate for Discharge   Problem: Safety: Goal: Ability to remain free from injury will improve Outcome: Adequate for Discharge   Problem: Skin Integrity: Goal: Risk for impaired skin integrity will decrease Outcome: Adequate for Discharge   Problem: Activity: Goal: Capacity to carry out activities will improve Outcome: Adequate for Discharge   Problem: Cardiac: Goal: Ability to achieve and maintain adequate cardiopulmonary perfusion will improve Outcome: Adequate for  Discharge

## 2021-07-22 NOTE — TOC Transition Note (Signed)
Transition of Care [facility]) - CM/SW Discharge Note   Patient Details  Name: Bryce Cain MRN: [id number] Date of Birth: 01-10-1950  [facility]) CM/SW Contact:  Zenon Mayo, RN Phone Number: 07/22/2021, 10:27 AM   Clinical Narrative:    For dc today. No needs.    Final next level of care: Home/Self Care Barriers to Discharge: No Barriers Identified   Patient Goals and CMS Choice        Discharge Placement                       Discharge Plan and Services   Discharge Planning Services: CM Consult                                 Social Determinants of Health (SDOH) Interventions     Readmission Risk Interventions    07/22/2021   10:22 AM  Readmission Risk Prevention Plan  Transportation Screening Complete  PCP or Specialist Appt within 3-5 Days Complete  HRI or [place] Complete  Social Work Consult for [place] Planning/Counseling Complete  Palliative Care Screening Not Applicable  Medication Review Press photographer) Complete

## 2021-07-22 NOTE — TOC Initial Note (Signed)
Transition of Care [facility]) - Initial/Assessment Note    Patient Details  Name: Bryce Cain MRN: [id number] Date of Birth: September 02, 1950  Transition [facility] Care [facility]) CM/SW Contact:    Zenon Mayo, RN Phone Number: 07/22/2021, 10:23 AM  Clinical Narrative:                 Patient being followed by HF team, patient for dc today, has no needs.   Expected Discharge Plan: Home/Self Care Barriers to Discharge: No Barriers Identified   Patient Goals and CMS Choice        Expected Discharge Plan and Services Expected Discharge Plan: Home/Self Care   Discharge Planning Services: CM Consult     Expected Discharge Date: 07/22/21                                    Prior Living Arrangements/Services       Do you feel safe going back to the place where you live?: Yes               Activities of Daily Living      Permission Sought/Granted                  Emotional Assessment       Orientation: : Oriented to Self, Oriented to Place, Oriented to  Time, Oriented to Situation Alcohol / Substance Use: Not Applicable Psych Involvement: No (comment)  Admission diagnosis:  Acute on chronic heart failure (HCC) [I50.9] Acute congestive heart failure, unspecified heart failure type [facility]) [I50.9] Patient Active Problem List   Diagnosis Date Noted   Acute on chronic heart failure ([place]) 07/20/2021   Renal mass 06/11/2021   OSA on CPAP    Renal cell carcinoma (HCC)    CHF (congestive heart failure) ([place]) 03/20/2021   LBBB (left bundle branch block) 03/20/2021   AKI (acute kidney injury) ([place]) 50/09/3816   Chronic systolic heart failure ([place]) 03/02/2021   Acute on chronic congestive heart failure ([place]) 02/14/2021   PCP:  [name], [name], MD Pharmacy:   [facility] [place], [place] [address] [place] [address] Phone: [phone] Fax: [phone]  [facility] [address] [address] Phone: [phone] Fax: [phone]     Social Determinants of Health (SDOH) Interventions    Readmission Risk Interventions    07/22/2021   10:22 AM  Readmission Risk Prevention Plan  Transportation Screening Complete  PCP or Specialist Appt within 3-5 Days Complete  HRI or [place] Complete  Social Work Consult for [place] Planning/Counseling Complete  Palliative Care Screening Not Applicable  Medication Review Press photographer) Complete

## 2021-07-22 NOTE — Progress Notes (Signed)
Reds clip 39%

## 2021-07-22 NOTE — TOC Initial Note (Addendum)
Transition of Care [facility]) - Initial/Assessment Note    Patient Details  Name: Bryce Cain MRN: [id number] Date of Birth: [birth year]  [facility]) CM/SW Contact:    Erenest Rasher, RN Phone Number: [phone] 07/22/2021, 10:38 AM  Clinical Narrative:                 HF TOC CM spoke to pt extensively at bedside. Pt had a complaint against the [employer] rep and wanted CM to follow up. States he refused device at dc, but rep was insistent that he take device. States after he got home returned the device a week a later. And states he is disputing bill of $4000. Explained CM will follow up [employer] rep on 07/22/2021. Pt reports he has scale at home. Wife at home to assist with care.   Meds to come up from [place] at dc.    Expected Discharge Plan: Home/Self Care Barriers to Discharge: No Barriers Identified   Patient Goals and CMS Choice   Expected Discharge Plan and Services Expected Discharge Plan: Home/Self Care   Discharge Planning Services: CM Consult   Living arrangements for the past 2 months: Single Family Home Expected Discharge Date: 07/22/21                                    Prior Living Arrangements/Services Living arrangements for the past 2 months: Single Family Home Lives with:: Spouse Patient language and need for interpreter reviewed:: Yes Do you feel safe going back to the place where you live?: Yes      Need for Family Participation in Patient Care: No (Comment) Care giver support system in place?: No (comment)   Criminal Activity/Legal Involvement Pertinent to Current Situation/Hospitalization: No - Comment as needed  Activities of Daily Living      Permission Sought/Granted Permission sought to share information with : Case Manager, PCP, Family Supports Permission granted to share information with : Yes, Verbal Permission Granted  Share Information with NAME: Oddie Bottger     Permission granted to share info w Relationship:  wife  Permission granted to share info w Contact Information: [phone]  Emotional Assessment Appearance:: Appears stated age Attitude/Demeanor/Rapport: Engaged Affect (typically observed): Accepting Orientation: : Oriented to Self, Oriented to Place, Oriented to  Time, Oriented to Situation Alcohol / Substance Use: Not Applicable Psych Involvement: No (comment)  Admission diagnosis:  Acute on chronic heart failure (HCC) [I50.9] Acute congestive heart failure, unspecified heart failure type [facility]) [I50.9] Patient Active Problem List   Diagnosis Date Noted   Acute on chronic heart failure ([place]) 07/20/2021   Renal mass 06/11/2021   OSA on CPAP    Renal cell carcinoma (HCC)    CHF (congestive heart failure) ([place]) 03/20/2021   LBBB (left bundle branch block) 03/20/2021   AKI (acute kidney injury) ([place]) 32/67/1245   Chronic systolic heart failure ([place]) 03/02/2021   Acute on chronic congestive heart failure ([place]) 02/14/2021   PCP:  [name], [name], MD Pharmacy:   [facility] [place], [place] [address] [place] [address] Phone: [phone] Fax: [phone]  [facility] [address] [place] [address] Phone: [phone] Fax: [phone]     Social Determinants of Health (SDOH) Interventions    Readmission Risk Interventions    07/22/2021  10:22 AM  Readmission Risk Prevention Plan  Transportation Screening Complete  PCP or Specialist Appt within 3-5 Days Complete  HRI or [place] Complete  Social Work Consult for [place] Planning/Counseling Complete  Palliative Care Screening Not Applicable  Medication Review Press photographer) Complete

## 2021-07-23 ENCOUNTER — Other Ambulatory Visit: Payer: Self-pay

## 2021-07-23 ENCOUNTER — Telehealth (HOSPITAL_COMMUNITY): Payer: Self-pay | Admitting: Pharmacy Technician

## 2021-07-23 NOTE — Telephone Encounter (Signed)
Advanced Heart Failure Patient Advocate Encounter  Prior Authorization for Bidil (generic) has been approved.    Effective dates: 07/21/21 through 01/03/22  Charlann Boxer, CPhT

## 2021-07-23 NOTE — TOC CM/SW Note (Addendum)
07/24/2021  100 pm Attempted call to pt and left voice message. Spoke to [place] and they have reimbursed [employer] for [employer] and pt has a $0 account. She attempted to reach out to pt and left message for him to return call.   07/23/2021 903 am HF TOC CM contacted pt and left voice message for return call. Jonnie Finner RN3 CCM, Heart Failure TOC CM [phone]   07/22/2021 1400 HF TOC CM received call back from [employer], Ginger Carne, provided her with information about pt's complaint regarding Life Vest. Explained she will follow up patient and their billing department. States CM can provide pt with her contact number also. [place], Heart Failure TOC CM [phone]

## 2021-07-23 NOTE — Patient Outreach (Signed)
[facility]) Care Management  07/23/2021  Bryce Cain 12/13/1950 [id number]  [place] [facility] [ACO] Patient: [name] Medicare  Primary Care Provider:  [name], [name], MD, [place] at [facility], is an embedded provider with a Chronic Care Management team and program, and is listed for the transition of care follow up and appointments.  Patient was screened for Embedded practice service needs for chronic care management/care coordination for post hospital needs.  A call was placed for follow up review for ongoing post hospital needs.  Was able to leave a HIPAA approved voicemail message.  Plan: Follow up if patient's returns call TOC needs for post hospital needs.  Please contact for further questions,  Natividad Brood, RN BSN [facility] Liaison  [phone] business mobile phone Toll free office [phone]  Fax number: [phone] [email or web address]

## 2021-07-29 NOTE — Progress Notes (Signed)
ADVANCED HF CLINIC CONSULT NOTE   Primary Care: [name], [name], MD HF Cardiologist: Dr. Haroldine Laws  HPI: Dr. 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   Admitted 2/23 with new onset systolic heart failure. Echo EF 20-25% with anterior/apical WMA. Moderate MR. RV normal.Cath with minimal nonobstructive CAD.  cMRI LVEF 16%, RVEF 52%, LGE basal septal midwall (scar pattern seen with NICM). Cardiomyopathy felt to be likely due to LBBB +/- frequent PVCs . EP consulted -> need to wait 3 months for CRT. Discharged home with LifeVest, weight 219 lbs.  Readmitted in 03/20/21 with low output HF and transient shock. Treated with milrinone and improved. Repeat echo EF 20% . Milrinone weaned off. Seen by EP regarding CRT but recommended waiting 3 months for GDMT   Renal u/s found large right renal mass and CT confirmed RCC.  Seen in Urology Clinic and felt to need relatively urgent right nephrectomy. s/p R nephrectomy d/t RCC 06/2021. Post nephrectomy HF meds stopped due to hypotension and AKI. Entresto, spironolactone, and torsemide held at discharge. He had f/u in HF clinic and creatinine remained elevated so meds remained on hold.   Admitted 07/19/21 with A/C HFrEF. Diuresed with IV lasix and transitioned to torsemide 20 mg daily. Overall diuresed 4 pounds. Renal function followed closely.  He will remain off spiro or entresto with elevated creatinine. Started on bidil for afterload reduction. Discharged 7/119/23   Today he returns for HF follow up.Overall feeling fine. Denies SOB/PND/Orthopnea. Able to walk up steps. He takes his time walking up steps. He does have some fatigue after walking up the steps. Appetite is good.  Drinking No fever or chills. Weight at home  208-211 pounds. Taking all medications.  Cardiac Studies: - Echo (2/23): EF 20-25% with anterior/apical WMA suggestive of previous anterior MI. Moderate  MR. RV normal    - R/LHC (2/23):   Prox RCA lesion is 30% stenosed.   1st Diag lesion is 20/% stenosed.   Prox Cx to Mid Cx lesion is 20% stenosed.  Ao = 102/70 (85) LV = 104/24 RA =  10 RV = 47/17 PA = 52/22 (33) PCW = 25 Fick cardiac output/index = 5.5/2.5 PVR =1.5 WU Ao sat = 98% PA sat = 65%, 66%   Assessment: 1. 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 Elevated filling pressures with normal cardiac output   - cMRI (2/23): LVEF 16%, RVEF 52%, LGE basal septal midwall (scar pattern seen with NICM).     Past Medical History:  Diagnosis Date   CHF (congestive heart failure) (HCC)    DM (diabetes mellitus), type 2 (HCC)    OSA on CPAP    Current Outpatient Medications  Medication Sig Dispense Refill   acetaminophen (TYLENOL) 500 MG tablet Take 500 mg by mouth every 6 (six) hours as needed.     aspirin EC 81 MG tablet Take 1 tablet (81 mg total) by mouth daily. Swallow whole. 90 tablet 3   docusate sodium (COLACE) 100 MG capsule Take 100 mg by mouth daily as needed for mild constipation.     empagliflozin (JARDIANCE) 10 MG TABS tablet Take 1 tablet (10 mg total) by mouth daily. 90 tablet 3   HYDROcodone-acetaminophen (NORCO) 5-325 MG tablet Take 1-2 tablets by mouth every 6 (six) hours as needed for moderate pain or severe pain. 20 tablet 0  isosorbide-hydrALAZINE (BIDIL) 20-37.5 MG tablet Take 1 tablet by mouth 3 (three) times daily. 90 tablet 6   Multiple Vitamin (MULTIVITAMIN) capsule Take 1 capsule by mouth daily.     potassium chloride (KLOR-CON M) 10 MEQ tablet Take 1 tablet (10 mEq total) by mouth daily. 30 tablet 3   PREVIDENT 5000 ENAMEL PROTECT 1.1-5 % GEL Place 1 application. onto teeth at bedtime.     promethazine (PHENERGAN) 12.5 MG tablet Take 1 tablet (12.5 mg total) by mouth every 4 (four) hours as needed for nausea or vomiting. 15 tablet 0   rosuvastatin (CRESTOR) 40 MG tablet Take 1 tablet (40 mg  total) by mouth daily. 90 tablet 3   torsemide (DEMADEX) 20 MG tablet Take 1 tablet (20 mg total) by mouth daily. 30 tablet 6   No current facility-administered medications for this encounter.   No Known Allergies  Social History   Socioeconomic History   Marital status: Married    Spouse name: Not on file   Number of children: Not on file   Years of education: Not on file   Highest education level: Not on file  Occupational History   Not on file  Tobacco Use   Smoking status: Former    Types: Pipe    Quit date: 02/05/1991    Years since quitting: 30.5   Smokeless tobacco: Not on file  Substance and Sexual Activity   Alcohol use: Not on file   Drug use: Not on file   Sexual activity: Not on file  Other Topics Concern   Not on file  Social History Narrative   Not on file   Social Determinants of Health   Financial Resource Strain: Low Risk  (04/02/2021)   Overall Financial Resource Strain (CARDIA)    Difficulty of Paying Living Expenses: Not hard at all  Food Insecurity: No Food Insecurity (04/02/2021)   Hunger Vital Sign    Worried About Running Out of Food in the Last Year: Never true    [place] in the Last Year: Never true  Transportation Needs: No Transportation Needs (04/02/2021)   PRAPARE - Hydrologist (Medical): No    Lack of Transportation (Non-Medical): No  Physical Activity: Not on file  Stress: Not on file  Social Connections: Not on file  Intimate Partner Violence: Not on file   Family History  Problem Relation Age of Onset   Heart attack Father    Wt Readings from Last 3 Encounters:  07/30/21 97.1 kg (214 lb)  07/22/21 94.7 kg (208 lb 11.2 oz)  07/03/21 98.4 kg (217 lb)   BP 118/78   Pulse 79   Wt 97.1 kg (214 lb)   SpO2 98%   BMI 30.71 kg/m   PHYSICAL EXAM: General:  Walked in the clinic.  No resp difficulty HEENT: normal Neck: supple. no JVD. Carotids 2+ bilat; no bruits. No lymphadenopathy or  thryomegaly appreciated. Cor: PMI nondisplaced. Regular rate & rhythm. No rubs, gallops or murmurs. Lungs: clear Abdomen: soft, nontender, nondistended. No hepatosplenomegaly. No bruits or masses. Good bowel sounds. Extremities: no cyanosis, clubbing, rash, edema Neuro: alert & orientedx3, cranial nerves grossly intact. moves all 4 extremities w/o difficulty. Affect pleasant   ASSESSMENT & PLAN:  1. Chronic systolic HF - Echo 1/93 EF 20-25% with anterior/apical WMA - R/LHC: Minimal non-obstructive CAD with separate left coronary ostia, severe NICM suspect due to LBBB, and elevated filling pressures with normal cardiac output.  - cMRI: LVEF  48%, RV systolic function  25%, basal septal midwall late gadolinium enhancement (scar pattern seen in NICM and associated with worse prognosis), RV insertion LGE  - Readmit ADHF/shock 3/23. Echo EF 20% Briefly required milrinone. Seen by EP again -> wait 3 months for CRT-D - Echo today 05/29/21 EF <20% LV markedly dilated. RV normal. Mild MR.  NYHA II. Volume status stable.  - Continue torsemide '20mg'$  daily  - Continue Jardiance 10 mg daily.  - Hold spiro and entresto due to elevated creatinine  - Continue digoxin 0.125 - Continue BIDI 1 tab tid.  - Check BMET    2. PVCs/NSVT - He has decided not to wear LifeVest   3. DM2 - A1c 5.4% - On SGLT2i.   4. CKD 3a - Continue Jardiance Check BMET    5. OSA - Continue CPAP. - Follows with Dr. Maxwell Caul at [facility].   6. Minimal non-obstructive CAD - On ASA/statin. - No s/s angina - LDL 74.   7. LBBB  - EF remains depressed despite maximal GDMT - Will refer back to EP for CRT-D  8. Large right renal cell CA 06/2021 S/P R Nephrectomy   Follow up with Dr Haroldine Laws in August.    Darrick Grinder, NP  12:24 PM

## 2021-07-30 ENCOUNTER — Ambulatory Visit (HOSPITAL_COMMUNITY)
Admission: RE | Admit: 2021-07-30 | Discharge: 2021-07-30 | Disposition: A | Discharge status: Home / Self Care | Payer: Medicare PPO | Source: Ambulatory Visit | Attending: Adult Health | Admitting: Adult Health

## 2021-07-30 ENCOUNTER — Encounter (HOSPITAL_COMMUNITY): Payer: Self-pay

## 2021-07-30 VITALS — BP 118/78 | HR 79 | Wt 214.0 lb

## 2021-07-30 DIAGNOSIS — E1122 Type 2 diabetes mellitus with diabetic chronic kidney disease: Secondary | ICD-10-CM | POA: Insufficient documentation

## 2021-07-30 DIAGNOSIS — I447 Left bundle-branch block, unspecified: Secondary | ICD-10-CM | POA: Diagnosis not present

## 2021-07-30 DIAGNOSIS — I2510 Atherosclerotic heart disease of native coronary artery without angina pectoris: Secondary | ICD-10-CM | POA: Insufficient documentation

## 2021-07-30 DIAGNOSIS — Z7984 Long term (current) use of oral hypoglycemic drugs: Secondary | ICD-10-CM | POA: Insufficient documentation

## 2021-07-30 DIAGNOSIS — N183 Chronic kidney disease, stage 3 unspecified: Secondary | ICD-10-CM

## 2021-07-30 DIAGNOSIS — I493 Ventricular premature depolarization: Secondary | ICD-10-CM | POA: Diagnosis not present

## 2021-07-30 DIAGNOSIS — I130 Hypertensive heart and chronic kidney disease with heart failure and stage 1 through stage 4 chronic kidney disease, or unspecified chronic kidney disease: Secondary | ICD-10-CM | POA: Insufficient documentation

## 2021-07-30 DIAGNOSIS — I5022 Chronic systolic (congestive) heart failure: Secondary | ICD-10-CM | POA: Diagnosis not present

## 2021-07-30 DIAGNOSIS — C641 Malignant neoplasm of right kidney, except renal pelvis: Secondary | ICD-10-CM | POA: Insufficient documentation

## 2021-07-30 DIAGNOSIS — I472 Ventricular tachycardia, unspecified: Secondary | ICD-10-CM | POA: Insufficient documentation

## 2021-07-30 DIAGNOSIS — Z905 Acquired absence of kidney: Secondary | ICD-10-CM | POA: Insufficient documentation

## 2021-07-30 DIAGNOSIS — I428 Other cardiomyopathies: Secondary | ICD-10-CM | POA: Diagnosis not present

## 2021-07-30 DIAGNOSIS — G4733 Obstructive sleep apnea (adult) (pediatric): Secondary | ICD-10-CM | POA: Insufficient documentation

## 2021-07-30 DIAGNOSIS — N1831 Chronic kidney disease, stage 3a: Secondary | ICD-10-CM | POA: Insufficient documentation

## 2021-07-30 LAB — BASIC METABOLIC PANEL
Anion gap: 8 (ref 5–15)
BUN: 39 mg/dL — ABNORMAL HIGH (ref 8–23)
CO2: 25 mmol/L (ref 22–32)
Calcium: 9.7 mg/dL (ref 8.9–10.3)
Chloride: 103 mmol/L (ref 98–111)
Creatinine, Ser: 2.52 mg/dL — ABNORMAL HIGH (ref 0.61–1.24)
GFR, Estimated: 26 mL/min — ABNORMAL LOW (ref >60)
Glucose, Bld: 104 mg/dL — ABNORMAL HIGH (ref 70–99)
Potassium: 3.9 mmol/L (ref 3.5–5.1)
Sodium: 136 mmol/L (ref 135–145)

## 2021-07-30 NOTE — Progress Notes (Signed)
ReDS Vest / Clip - 07/30/21 1200       ReDS Vest / Clip   Station Marker C    Ruler Value 30.5    ReDS Value Range Moderate volume overload    ReDS Actual Value 39

## 2021-07-30 NOTE — Patient Instructions (Addendum)
Labs done today. We will contact you only if your labs are abnormal.  No medication changes were made. Please continue all current medications as prescribed.  Your physician recommends that you keep scheduled follow-up appointment in August.  If you have any questions or concerns before your next appointment please send [place] a message through [place] or call our office at [phone].    TO LEAVE A MESSAGE FOR THE NURSE SELECT OPTION 2, PLEASE LEAVE A MESSAGE INCLUDING: YOUR NAME DATE OF BIRTH CALL BACK NUMBER REASON FOR CALL**this is important as we prioritize the call backs  YOU WILL RECEIVE A CALL BACK THE SAME DAY AS LONG AS YOU CALL BEFORE 4:00 PM   Do the following things EVERYDAY: Weigh yourself in the morning before breakfast. Write it down and keep it in a log. Take your medicines as prescribed Eat low salt foods--Limit salt (sodium) to 2000 mg per day.  Stay as active as you can everyday Limit all fluids for the day to less than 2 liters   At the [facility], you and your health needs are our priority. As part of our continuing mission to provide you with exceptional heart care, we have created designated Provider Care Teams. These Care Teams include your primary Cardiologist (physician) and Advanced Practice Providers (APPs- Physician Assistants and Nurse Practitioners) who all work together to provide you with the care you need, when you need it.   You may see any of the following providers on your designated Care Team at your next follow up: Dr Glori Bickers Dr Haynes Kerns, NP Lyda Jester, [place] Audry Riles, PharmD   Please be sure to bring in all your medications bottles to every appointment.

## 2021-08-07 ENCOUNTER — Other Ambulatory Visit: Payer: Self-pay

## 2021-08-07 ENCOUNTER — Inpatient Hospital Stay: Payer: Medicare PPO | Attending: Oncology | Admitting: Oncology

## 2021-08-07 VITALS — BP 108/69 | HR 78 | Temp 97.9°F | Resp 15 | Wt 214.0 lb

## 2021-08-07 DIAGNOSIS — Z7984 Long term (current) use of oral hypoglycemic drugs: Secondary | ICD-10-CM | POA: Insufficient documentation

## 2021-08-07 DIAGNOSIS — G4733 Obstructive sleep apnea (adult) (pediatric): Secondary | ICD-10-CM | POA: Diagnosis not present

## 2021-08-07 DIAGNOSIS — E119 Type 2 diabetes mellitus without complications: Secondary | ICD-10-CM | POA: Diagnosis not present

## 2021-08-07 DIAGNOSIS — Z7982 Long term (current) use of aspirin: Secondary | ICD-10-CM | POA: Diagnosis not present

## 2021-08-07 DIAGNOSIS — C641 Malignant neoplasm of right kidney, except renal pelvis: Secondary | ICD-10-CM | POA: Insufficient documentation

## 2021-08-07 DIAGNOSIS — I509 Heart failure, unspecified: Secondary | ICD-10-CM | POA: Insufficient documentation

## 2021-08-07 DIAGNOSIS — Z79899 Other long term (current) drug therapy: Secondary | ICD-10-CM | POA: Diagnosis not present

## 2021-08-07 DIAGNOSIS — Z5112 Encounter for antineoplastic immunotherapy: Secondary | ICD-10-CM | POA: Insufficient documentation

## 2021-08-07 DIAGNOSIS — Z905 Acquired absence of kidney: Secondary | ICD-10-CM | POA: Insufficient documentation

## 2021-08-07 MED ORDER — PROCHLORPERAZINE MALEATE 10 MG PO TABS: 10.0000 mg | ORAL_TABLET | Freq: Four times a day (QID) | ORAL | 0 refills | Status: DC | PRN

## 2021-08-07 NOTE — Progress Notes (Signed)
Reason for the request:    Kidney cancer  HPI: I was asked by Dr. Lovena Neighbours to evaluate Bryce Cain for the evaluation of kidney cancer.  He is a 71 year old gentleman with history of diabetes and congestive heart failure who was diagnosed with kidney cancer in June 2023.  At that time he was found to have 10.2 cm right renal mass that is concerning for renal neoplasm that was found incidentally during evaluation.  He was evaluated by Dr. Lovena Neighbours and underwent robotic assisted laparoscopic right radical nephrectomy on June 23, 2021.  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  Imaging studies including initial MRI in 2021/05/17 detected the solid right enhancing kidney mass without evidence of any adenopathy.  CT scan of the chest obtained in March 2023 did not show any evidence of pulmonary metastatic disease.  He recovered reasonably well postoperatively but was hospitalized again for congestive heart failure.  Since his discharge, he reports feeling well without any major complaints.  He denies any shortness of breath difficulty breathing or discomfort.    He does not report any headaches, blurry vision, syncope or seizures. Does not report any fevers, chills or sweats.  Does not report any cough, wheezing or hemoptysis.  Does not report any chest pain, palpitation, orthopnea or leg edema.  Does not report any nausea, vomiting or abdominal pain.  Does not report any constipation or diarrhea.  Does not report any skeletal complaints.    Does not report frequency, urgency or hematuria.  Does not report any skin rashes or lesions. Does not report any heat or cold intolerance.  Does not report any lymphadenopathy or petechiae.  Does not report any anxiety or depression.  Remaining review of systems is negative.     Past Medical History:  Diagnosis Date   CHF (congestive heart failure) (HCC)    DM (diabetes mellitus), type 2  (HCC)    OSA on CPAP   :   Past Surgical History:  Procedure Laterality Date   RIGHT/LEFT HEART CATH AND CORONARY ANGIOGRAPHY N/A 02/16/2021   Procedure: RIGHT/LEFT HEART CATH AND CORONARY ANGIOGRAPHY;  Surgeon: Jolaine Artist, MD;  Location: [facility];  Service: Cardiovascular;  Laterality: N/A;   ROBOT ASSISTED LAPAROSCOPIC NEPHRECTOMY Right 06/23/2021   Procedure: XI ROBOTIC ASSISTED LAPAROSCOPIC NEPHRECTOMY;  Surgeon: Ceasar Mons, MD;  Location: [facility];  Service: Urology;  Laterality: Right;  :   Current Outpatient Medications:    acetaminophen (TYLENOL) 500 MG tablet, Take 500 mg by mouth every 6 (six) hours as needed., Disp: , Rfl:    aspirin EC 81 MG tablet, Take 1 tablet (81 mg total) by mouth daily. Swallow whole., Disp: 90 tablet, Rfl: 3   docusate sodium (COLACE) 100 MG capsule, Take 100 mg by mouth daily as needed for mild constipation., Disp: , Rfl:    empagliflozin (JARDIANCE) 10 MG TABS tablet, Take 1 tablet (10 mg total) by mouth daily., Disp: 90 tablet, Rfl: 3   HYDROcodone-acetaminophen (NORCO) 5-325 MG tablet, Take 1-2 tablets by mouth every 6 (six) hours as needed for moderate pain or severe pain., Disp: 20 tablet, Rfl: 0   isosorbide-hydrALAZINE (BIDIL) 20-37.5 MG tablet, Take 1 tablet by mouth 3 (three) times daily., Disp: 90 tablet, Rfl: 6   Multiple Vitamin (MULTIVITAMIN) capsule, Take 1 capsule by mouth daily., Disp: , Rfl:    potassium chloride (KLOR-CON M) 10 MEQ tablet, Take 1 tablet (  10 mEq total) by mouth daily., Disp: 30 tablet, Rfl: 3   PREVIDENT 5000 ENAMEL PROTECT 1.1-5 % GEL, Place 1 application. onto teeth at bedtime., Disp: , Rfl:    promethazine (PHENERGAN) 12.5 MG tablet, Take 1 tablet (12.5 mg total) by mouth every 4 (four) hours as needed for nausea or vomiting., Disp: 15 tablet, Rfl: 0   rosuvastatin (CRESTOR) 40 MG tablet, Take 1 tablet (40 mg total) by mouth daily., Disp: 90 tablet, Rfl: 3   torsemide (DEMADEX) 20 MG  tablet, Take 1 tablet (20 mg total) by mouth daily., Disp: 30 tablet, Rfl: 6:  No Known Allergies:   Family History  Problem Relation Age of Onset   Heart attack Father   :   Social History   Socioeconomic History   Marital status: Married    Spouse name: Not on file   Number of children: Not on file   Years of education: Not on file   Highest education level: Not on file  Occupational History   Not on file  Tobacco Use   Smoking status: Former    Types: Pipe    Quit date: 02/05/1991    Years since quitting: 30.5   Smokeless tobacco: Not on file  Substance and Sexual Activity   Alcohol use: Not on file   Drug use: Not on file   Sexual activity: Not on file  Other Topics Concern   Not on file  Social History Narrative   Not on file   Social Determinants of Health   Financial Resource Strain: Low Risk  (04/02/2021)   Overall Financial Resource Strain (CARDIA)    Difficulty of Paying Living Expenses: Not hard at all  Food Insecurity: No Food Insecurity (04/02/2021)   Hunger Vital Sign    Worried About Running Out of Food in the Last Year: Never true    [place] in the Last Year: Never true  Transportation Needs: No Transportation Needs (04/02/2021)   PRAPARE - Hydrologist (Medical): No    Lack of Transportation (Non-Medical): No  Physical Activity: Not on file  Stress: Not on file  Social Connections: Not on file  Intimate Partner Violence: Not on file  :  Pertinent items are noted in HPI.  Exam:  General appearance: alert and cooperative appeared without distress. Head: atraumatic without any abnormalities. Eyes: conjunctivae/corneas clear. PERRL.  Sclera anicteric. Throat: lips, mucosa, and tongue normal; without oral thrush or ulcers. Resp: clear to auscultation bilaterally without rhonchi, wheezes or dullness to percussion. Cardio: regular rate and rhythm, S1, S2 normal, no murmur, click, rub or gallop GI: soft,  non-tender; bowel sounds normal; no masses,  no organomegaly Skin: Skin color, texture, turgor normal. No rashes or lesions Lymph nodes: Cervical, supraclavicular, and axillary nodes normal. Neurologic: Grossly normal without any motor, sensory or deep tendon reflexes. Musculoskeletal: No joint deformity or effusion.  DG Chest 2 View  Result Date: 07/19/2021 CLINICAL DATA:  Shortness of breath. EXAM: CHEST - 2 VIEW COMPARISON:  Chest radiograph dated 03/20/2021. FINDINGS: Bibasilar atelectasis. Pneumonia is not excluded. No pleural effusion pneumothorax. Mild cardiomegaly. No acute osseous pathology. IMPRESSION: Bibasilar atelectasis. Pneumonia is not excluded. Electronically Signed   By: Anner Crete M.D.   On: 07/19/2021 21:39    Assessment and Plan:   46 year old with:  1.  Kidney cancer diagnosed in June 2023.  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  Treatment options moving forward were reviewed at this time.  The role of adjuvant immunotherapy were discussed today in detail.  Pembrolizumab has been approved for this indication with 200 mg IV dosing every 3 weeks for a total of 17 cycles to complete 1 year.  This has resulted in improvement and progression free survival although no overall survival benefit has been detected yet.  Complication associated with immunotherapy were reviewed today in detail.  These include pneumonitis, colitis, thyroid disease as well as GI toxicity.  Alternative treatment option would be active surveillance only without any additional therapy.  The use of adjuvant immunotherapy has reduced the risk of relapse incrementally but certainly not a guarantee.  After discussion he is agreeable to proceed.   2.  IV access: Peripheral veins will be used   3.  Autoimmune complications: These will be monitored periodically including thyroid disease.  4.  Follow-up: We will be in the near  future to start therapy.   60  minutes were dedicated to this visit. The time was spent on reviewing pathology results, imaging studies, discussing treatment options, and answering questions regarding future plan.     A copy of this consult has been forwarded to the requesting physician.

## 2021-08-07 NOTE — Progress Notes (Signed)
START ON PATHWAY REGIMEN - Renal Cell     A cycle is every 21 days:     Pembrolizumab   **Always confirm dose/schedule in your pharmacy ordering system**  Patient Characteristics: Postoperative without Neoadjuvant Therapy, M0 (Pathologic Staging), Stage I, II, III, or Resected T4M0 Stage IV, Intermediate-High Risk Therapeutic Status: Postoperative without Neoadjuvant Therapy, M0 (Pathologic Staging) AJCC M Category: Staged < 8th Ed. AJCC 8 Stage Grouping: Unknown AJCC T Category: pT3 AJCC N Category: pN0 Risk Status: Intermediate-High Risk Intent of Therapy: Curative Intent, Discussed with Patient

## 2021-08-08 ENCOUNTER — Other Ambulatory Visit: Payer: Self-pay

## 2021-08-10 DIAGNOSIS — C641 Malignant neoplasm of right kidney, except renal pelvis: Secondary | ICD-10-CM | POA: Diagnosis not present

## 2021-08-11 ENCOUNTER — Telehealth: Payer: Self-pay | Admitting: Oncology

## 2021-08-11 ENCOUNTER — Other Ambulatory Visit: Payer: Self-pay

## 2021-08-11 NOTE — Telephone Encounter (Signed)
Scheduled per 08/04 los, patient has been called and voicemail was left.

## 2021-08-12 NOTE — Progress Notes (Signed)
Pharmacist Chemotherapy Monitoring - Initial Assessment    Anticipated start date: 08/19/21   The following has been reviewed per standard work regarding the patient's treatment regimen: The patient's diagnosis, treatment plan and drug doses, and organ/hematologic function Lab orders and baseline tests specific to treatment regimen  The treatment plan start date, drug sequencing, and pre-medications Prior authorization status  Patient's documented medication list, including drug-drug interaction screen and prescriptions for anti-emetics and supportive care specific to the treatment regimen The drug concentrations, fluid compatibility, administration routes, and timing of the medications to be used The patient's access for treatment and lifetime cumulative dose history, if applicable  The patient's medication allergies and previous infusion related reactions, if applicable   Changes made to treatment plan:  N/A  Follow up needed:  Pending authorization for treatment    Larene Beach, RPH, 08/12/2021  11:15 AM

## 2021-08-13 ENCOUNTER — Other Ambulatory Visit (HOSPITAL_COMMUNITY): Payer: Self-pay

## 2021-08-14 ENCOUNTER — Other Ambulatory Visit: Payer: Self-pay

## 2021-08-14 ENCOUNTER — Inpatient Hospital Stay: Payer: Medicare PPO

## 2021-08-19 ENCOUNTER — Inpatient Hospital Stay: Payer: Medicare PPO

## 2021-08-19 ENCOUNTER — Other Ambulatory Visit: Payer: Self-pay

## 2021-08-19 VITALS — BP 108/63 | HR 71 | Temp 97.8°F | Resp 16 | Wt 216.5 lb

## 2021-08-19 DIAGNOSIS — C641 Malignant neoplasm of right kidney, except renal pelvis: Secondary | ICD-10-CM

## 2021-08-19 DIAGNOSIS — Z7984 Long term (current) use of oral hypoglycemic drugs: Secondary | ICD-10-CM | POA: Diagnosis not present

## 2021-08-19 DIAGNOSIS — Z5112 Encounter for antineoplastic immunotherapy: Secondary | ICD-10-CM | POA: Diagnosis not present

## 2021-08-19 DIAGNOSIS — Z905 Acquired absence of kidney: Secondary | ICD-10-CM | POA: Diagnosis not present

## 2021-08-19 DIAGNOSIS — G4733 Obstructive sleep apnea (adult) (pediatric): Secondary | ICD-10-CM | POA: Diagnosis not present

## 2021-08-19 DIAGNOSIS — Z7982 Long term (current) use of aspirin: Secondary | ICD-10-CM | POA: Diagnosis not present

## 2021-08-19 DIAGNOSIS — Z79899 Other long term (current) drug therapy: Secondary | ICD-10-CM | POA: Diagnosis not present

## 2021-08-19 DIAGNOSIS — E119 Type 2 diabetes mellitus without complications: Secondary | ICD-10-CM | POA: Diagnosis not present

## 2021-08-19 DIAGNOSIS — I509 Heart failure, unspecified: Secondary | ICD-10-CM | POA: Diagnosis not present

## 2021-08-19 LAB — CBC WITH DIFFERENTIAL (CANCER CENTER ONLY)
Abs Immature Granulocytes: 0 10*3/uL (ref 0.00–0.07)
Basophils Absolute: 0 10*3/uL (ref 0.0–0.1)
Basophils Relative: 1 %
Eosinophils Absolute: 0.2 10*3/uL (ref 0.0–0.5)
Eosinophils Relative: 5 %
HCT: 31.4 % — ABNORMAL LOW (ref 39.0–52.0)
Hemoglobin: 11.4 g/dL — ABNORMAL LOW (ref 13.0–17.0)
Immature Granulocytes: 0 %
Lymphocytes Relative: 26 %
Lymphs Abs: 1.1 10*3/uL (ref 0.7–4.0)
MCH: 29 pg (ref 26.0–34.0)
MCHC: 36.3 g/dL — ABNORMAL HIGH (ref 30.0–36.0)
MCV: 79.9 fL — ABNORMAL LOW (ref 80.0–100.0)
Monocytes Absolute: 0.6 10*3/uL (ref 0.1–1.0)
Monocytes Relative: 13 %
Neutro Abs: 2.3 10*3/uL (ref 1.7–7.7)
Neutrophils Relative %: 55 %
Platelet Count: 145 10*3/uL — ABNORMAL LOW (ref 150–400)
RBC: 3.93 MIL/uL — ABNORMAL LOW (ref 4.22–5.81)
RDW: 15.3 % (ref 11.5–15.5)
WBC Count: 4.3 10*3/uL (ref 4.0–10.5)
nRBC: 0 % (ref 0.0–0.2)

## 2021-08-19 LAB — CMP (CANCER CENTER ONLY)
ALT: 15 U/L (ref 0–44)
AST: 21 U/L (ref 15–41)
Albumin: 4.1 g/dL (ref 3.5–5.0)
Alkaline Phosphatase: 41 U/L (ref 38–126)
Anion gap: 5 (ref 5–15)
BUN: 49 mg/dL — ABNORMAL HIGH (ref 8–23)
CO2: 23 mmol/L (ref 22–32)
Calcium: 10 mg/dL (ref 8.9–10.3)
Chloride: 110 mmol/L (ref 98–111)
Creatinine: 2.45 mg/dL — ABNORMAL HIGH (ref 0.61–1.24)
GFR, Estimated: 27 mL/min — ABNORMAL LOW (ref >60)
Glucose, Bld: 100 mg/dL — ABNORMAL HIGH (ref 70–99)
Potassium: 4.1 mmol/L (ref 3.5–5.1)
Sodium: 138 mmol/L (ref 135–145)
Total Bilirubin: 0.6 mg/dL (ref 0.3–1.2)
Total Protein: 7.7 g/dL (ref 6.5–8.1)

## 2021-08-19 LAB — TSH: TSH: 1.483 u[IU]/mL (ref 0.350–4.500)

## 2021-08-19 MED ORDER — SODIUM CHLORIDE 0.9 % IV SOLN
200.0000 mg | Freq: Once | INTRAVENOUS | Status: AC
  Administered 2021-08-19: 200 mg via INTRAVENOUS
  Filled 2021-08-19: qty 200

## 2021-08-19 MED ORDER — SODIUM CHLORIDE 0.9 % IV SOLN: Freq: Once | INTRAVENOUS | Status: AC

## 2021-08-19 NOTE — Patient Instructions (Addendum)
[facility]  Discharge Instructions: Thank you for choosing [facility] to provide your oncology and hematology care.   If you have a lab appointment with the [facility], please go directly to the [facility] and check in at the registration area.   Wear comfortable clothing and clothing appropriate for easy access to any Portacath or PICC line.   We strive to give you quality time with your provider. You may need to reschedule your appointment if you arrive late (15 or more minutes).  Arriving late affects you and other patients whose appointments are after yours.  Also, if you miss three or more appointments without notifying the office, you may be dismissed from the clinic at the provider's discretion.      For prescription refill requests, have your pharmacy contact our office and allow 72 hours for refills to be completed.    Today you received the following chemotherapy and/or immunotherapy agents: Keytruda (pembrolizumab)      To help prevent nausea and vomiting after your treatment, we encourage you to take your nausea medication as directed.  BELOW ARE SYMPTOMS THAT SHOULD BE REPORTED IMMEDIATELY: *FEVER GREATER THAN 100.4 F (38 C) OR HIGHER *CHILLS OR SWEATING *NAUSEA AND VOMITING THAT IS NOT CONTROLLED WITH YOUR NAUSEA MEDICATION *UNUSUAL SHORTNESS OF BREATH *UNUSUAL BRUISING OR BLEEDING *URINARY PROBLEMS (pain or burning when urinating, or frequent urination) *BOWEL PROBLEMS (unusual diarrhea, constipation, pain near the anus) TENDERNESS IN MOUTH AND THROAT WITH OR WITHOUT PRESENCE OF ULCERS (sore throat, sores in mouth, or a toothache) UNUSUAL RASH, SWELLING OR PAIN  UNUSUAL VAGINAL DISCHARGE OR ITCHING   Items with * indicate a potential emergency and should be followed up as soon as possible or go to the Emergency Department if any problems should occur.  Please show the CHEMOTHERAPY ALERT CARD or IMMUNOTHERAPY ALERT CARD  at check-in to the Emergency Department and triage nurse.  Should you have questions after your visit or need to cancel or reschedule your appointment, please contact [facility]  Dept: [phone]  and follow the prompts.  Office hours are 8:00 a.m. to 4:30 p.m. Monday - Friday. Please note that voicemails left after 4:00 p.m. may not be returned until the following business day.  We are closed weekends and major holidays. You have access to a nurse at all times for urgent questions. Please call the main number to the clinic Dept: [phone] and follow the prompts.   For any non-urgent questions, you may also contact your provider using MyChart. We now offer e-Visits for anyone 32 and older to request care online for non-urgent symptoms. For details visit [email or web address].   Also download the MyChart app! Go to the app store, search "MyChart", open the app, select [facility], and log in with your MyChart username and password.  Masks are optional in the cancer centers. If you would like for your care team to wear a mask while they are taking care of you, please let them know. You may have one support person who is at least 73 years old accompany you for your appointments. Pembrolizumab Injection What is this medication? PEMBROLIZUMAB (PEM broe LIZ ue mab) treats some types of cancer. It works by helping your immune system slow or stop the spread of cancer cells. It is a monoclonal antibody. This medicine may be used for other purposes; ask your health care provider or pharmacist if you have questions. COMMON BRAND NAME(S): Keytruda What  should I tell my care team before I take this medication? They need to know if you have any of these conditions: Allogeneic stem cell transplant (uses someone else's stem cells) Autoimmune diseases, such as Crohn disease, ulcerative colitis, lupus History of chest radiation Nervous system problems, such as Guillain-Barre  syndrome, myasthenia gravis Organ transplant An unusual or allergic reaction to pembrolizumab, other medications, foods, dyes, or preservatives Pregnant or trying to get pregnant Breast-feeding How should I use this medication? This medication is injected into a vein. It is given by your care team in a hospital or clinic setting. A special MedGuide will be given to you before each treatment. Be sure to read this information carefully each time. Talk to your care team about the use of this medication in children. While it may be prescribed for children as young as 6 months for selected conditions, precautions do apply. Overdosage: If you think you have taken too much of this medicine contact a poison control center or emergency room at once. NOTE: This medicine is only for you. Do not share this medicine with others. What if I miss a dose? Keep appointments for follow-up doses. It is important not to miss your dose. Call your care team if you are unable to keep an appointment. What may interact with this medication? Interactions have not been studied. This list may not describe all possible interactions. Give your health care provider a list of all the medicines, herbs, non-prescription drugs, or dietary supplements you use. Also tell them if you smoke, drink alcohol, or use illegal drugs. Some items may interact with your medicine. What should I watch for while using this medication? Your condition will be monitored carefully while you are receiving this medication. You may need blood work while taking this medication. This medication may cause serious skin reactions. They can happen weeks to months after starting the medication. Contact your care team right away if you notice fevers or flu-like symptoms with a rash. The rash may be red or purple and then turn into blisters or peeling of the skin. You may also notice a red rash with swelling of the face, lips, or lymph nodes in your neck or under  your arms. Tell your care team right away if you have any change in your eyesight. Talk to your care team if you may be pregnant. Serious birth defects can occur if you take this medication during pregnancy and for 4 months after the last dose. You will need a negative pregnancy test before starting this medication. Contraception is recommended while taking this medication and for 4 months after the last dose. Your care team can help you find the option that works for you. Do not breastfeed while taking this medication and for 4 months after the last dose. What side effects may I notice from receiving this medication? Side effects that you should report to your care team as soon as possible: Allergic reactions--skin rash, itching, hives, swelling of the face, lips, tongue, or throat Dry cough, shortness of breath or trouble breathing Eye pain, redness, irritation, or discharge with blurry or decreased vision Heart muscle inflammation--unusual weakness or fatigue, shortness of breath, chest pain, fast or irregular heartbeat, dizziness, swelling of the ankles, feet, or hands Hormone gland problems--headache, sensitivity to light, unusual weakness or fatigue, dizziness, fast or irregular heartbeat, increased sensitivity to cold or heat, excessive sweating, constipation, hair loss, increased thirst or amount of urine, tremors or shaking, irritability Infusion reactions--chest pain, shortness of breath  or trouble breathing, feeling faint or lightheaded Kidney injury (glomerulonephritis)--decrease in the amount of urine, red or dark brown urine, foamy or bubbly urine, swelling of the ankles, hands, or feet Liver injury--right upper belly pain, loss of appetite, nausea, light-colored stool, dark yellow or brown urine, yellowing skin or eyes, unusual weakness or fatigue Pain, tingling, or numbness in the hands or feet, muscle weakness, change in vision, confusion or trouble speaking, loss of balance or  coordination, trouble walking, seizures Rash, fever, and swollen lymph nodes Redness, blistering, peeling, or loosening of the skin, including inside the mouth Sudden or severe stomach pain, bloody diarrhea, fever, nausea, vomiting Side effects that usually do not require medical attention (report to your care team if they continue or are bothersome): Bone, joint, or muscle pain Diarrhea Fatigue Loss of appetite Nausea Skin rash This list may not describe all possible side effects. Call your doctor for medical advice about side effects. You may report side effects to FDA at 1-800-FDA-1088. Where should I keep my medication? This medication is given in a hospital or clinic. It will not be stored at home. NOTE: This sheet is a summary. It may not cover all possible information. If you have questions about this medicine, talk to your doctor, pharmacist, or health care provider.  2023 Elsevier/Gold Standard (2021-04-13 00:00:00)

## 2021-08-21 ENCOUNTER — Other Ambulatory Visit: Payer: Self-pay | Admitting: Oncology

## 2021-08-21 DIAGNOSIS — C641 Malignant neoplasm of right kidney, except renal pelvis: Secondary | ICD-10-CM

## 2021-08-31 ENCOUNTER — Other Ambulatory Visit (HOSPITAL_BASED_OUTPATIENT_CLINIC_OR_DEPARTMENT_OTHER): Payer: Self-pay

## 2021-09-03 ENCOUNTER — Ambulatory Visit (HOSPITAL_COMMUNITY)
Admission: RE | Admit: 2021-09-03 | Discharge: 2021-09-03 | Disposition: A | Discharge status: Home / Self Care | Payer: Medicare PPO | Source: Ambulatory Visit | Attending: Internal Medicine | Admitting: Internal Medicine

## 2021-09-03 ENCOUNTER — Encounter (HOSPITAL_COMMUNITY): Payer: Self-pay | Admitting: Internal Medicine

## 2021-09-03 VITALS — BP 110/60 | HR 77 | Wt 219.6 lb

## 2021-09-03 DIAGNOSIS — I130 Hypertensive heart and chronic kidney disease with heart failure and stage 1 through stage 4 chronic kidney disease, or unspecified chronic kidney disease: Secondary | ICD-10-CM | POA: Insufficient documentation

## 2021-09-03 DIAGNOSIS — I5022 Chronic systolic (congestive) heart failure: Secondary | ICD-10-CM

## 2021-09-03 DIAGNOSIS — I447 Left bundle-branch block, unspecified: Secondary | ICD-10-CM | POA: Diagnosis not present

## 2021-09-03 DIAGNOSIS — I2510 Atherosclerotic heart disease of native coronary artery without angina pectoris: Secondary | ICD-10-CM | POA: Insufficient documentation

## 2021-09-03 DIAGNOSIS — L905 Scar conditions and fibrosis of skin: Secondary | ICD-10-CM | POA: Diagnosis not present

## 2021-09-03 DIAGNOSIS — E1122 Type 2 diabetes mellitus with diabetic chronic kidney disease: Secondary | ICD-10-CM | POA: Insufficient documentation

## 2021-09-03 DIAGNOSIS — I472 Ventricular tachycardia, unspecified: Secondary | ICD-10-CM | POA: Diagnosis not present

## 2021-09-03 DIAGNOSIS — I428 Other cardiomyopathies: Secondary | ICD-10-CM | POA: Insufficient documentation

## 2021-09-03 DIAGNOSIS — Z7984 Long term (current) use of oral hypoglycemic drugs: Secondary | ICD-10-CM | POA: Insufficient documentation

## 2021-09-03 DIAGNOSIS — C641 Malignant neoplasm of right kidney, except renal pelvis: Secondary | ICD-10-CM | POA: Diagnosis not present

## 2021-09-03 DIAGNOSIS — Z905 Acquired absence of kidney: Secondary | ICD-10-CM | POA: Insufficient documentation

## 2021-09-03 DIAGNOSIS — N184 Chronic kidney disease, stage 4 (severe): Secondary | ICD-10-CM

## 2021-09-03 DIAGNOSIS — G4733 Obstructive sleep apnea (adult) (pediatric): Secondary | ICD-10-CM | POA: Insufficient documentation

## 2021-09-03 DIAGNOSIS — R7989 Other specified abnormal findings of blood chemistry: Secondary | ICD-10-CM | POA: Insufficient documentation

## 2021-09-03 NOTE — Patient Instructions (Signed)
You have been referred to follow up with Dr Curt Bears, his office will call you for an appointment  Your physician recommends that you schedule a follow-up appointment in: 3-4 months  If you have any questions or concerns before your next appointment please send [place] a message through [facility] or call our office at [phone].    TO LEAVE A MESSAGE FOR THE NURSE SELECT OPTION 2, PLEASE LEAVE A MESSAGE INCLUDING: YOUR NAME DATE OF BIRTH CALL BACK NUMBER REASON FOR CALL**this is important as we prioritize the call backs  YOU WILL RECEIVE A CALL BACK THE SAME DAY AS LONG AS YOU CALL BEFORE 4:00 PM   At the [facility], you and your health needs are our priority. As part of our continuing mission to provide you with exceptional heart care, we have created designated Provider Care Teams. These Care Teams include your primary Cardiologist (physician) and Advanced Practice Providers (APPs- Physician Assistants and Nurse Practitioners) who all work together to provide you with the care you need, when you need it.   You may see any of the following providers on your designated Care Team at your next follow up: Dr Glori Bickers Dr Loralie Champagne Dr. Roxana Hires, NP Lyda Jester, [place] [facility] [place], [place] Forestine Na, NP Audry Riles, PharmD   Please be sure to bring in all your medications bottles to every appointment.

## 2021-09-03 NOTE — Addendum Note (Signed)
Encounter addended by: Scarlette Calico, RN on: 09/03/2021 3:42 PM  Actions taken: Clinical Note Signed

## 2021-09-03 NOTE — Progress Notes (Signed)
ADVANCED HF CLINIC CONSULT NOTE   Primary Care: Bryce Cain, Bryce Halsted, MD HF Cardiologist: Dr. 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   Admitted 2/23 with new onset systolic heart failure. Echo EF 20-25% with anterior/apical WMA. Moderate MR. RV normal.Cath with minimal nonobstructive CAD.  cMRI LVEF 16%, RVEF 52%, LGE basal septal midwall (scar pattern seen with NICM). Cardiomyopathy felt to be likely due to LBBB +/- frequent PVCs . EP consulted -> need to wait 3 months for CRT. Discharged home with LifeVest, weight 219 lbs.  Readmitted in 03/20/21 with low output HF and transient shock. Treated with milrinone and improved. Repeat echo EF 20% . Milrinone weaned off. Seen by EP regarding CRT but recommended waiting 3 months for GDMT   Renal u/s found large right renal mass and CT confirmed RCC.  Seen in Urology Clinic and felt to need relatively urgent right nephrectomy. s/p R nephrectomy d/t RCC 06/2021. Post nephrectomy HF meds stopped due to hypotension and AKI. Entresto, spironolactone, and torsemide held at discharge. He had f/u in HF clinic and creatinine remained elevated so meds remained on hold.   Admitted 07/19/21 with A/C HFrEF. Diuresed with IV lasix and transitioned to torsemide 20 mg daily. Overall diuresed 4 pounds. Renal function followed closely.  He will remain off spiro or entresto with elevated creatinine. Started on bidil for afterload reduction. Discharged 7/119/23   Today he returns for HF follow up. Feels good. No SOB, orthopnea or PND. Remains active with daily activities but not exercising. Scr stuck around 2.5   Cardiac Studies: - Echo (2/23): EF 20-25% with anterior/apical WMA suggestive of previous anterior MI. Moderate MR. RV normal    - R/LHC (2/23):   Prox RCA lesion is 30% stenosed.   1st Diag lesion is 20/% stenosed.   Prox Cx to Mid Cx lesion is 20%  stenosed.  Ao = 102/70 (85) LV = 104/24 RA =  10 RV = 47/17 PA = 52/22 (33) PCW = 25 Fick cardiac output/index = 5.5/2.5 PVR =1.5 WU Ao sat = 98% PA sat = 65%, 66%   Assessment: 1. 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 Elevated filling pressures with normal cardiac output   - cMRI (2/23): LVEF 16%, RVEF 52%, LGE basal septal midwall (scar pattern seen with NICM).     Past Medical History:  Diagnosis Date   CHF (congestive heart failure) (HCC)    DM (diabetes mellitus), type 2 (HCC)    OSA on CPAP    Current Outpatient Medications  Medication Sig Dispense Refill   acetaminophen (TYLENOL) 500 MG tablet Take 500 mg by mouth every 6 (six) hours as needed.     aspirin EC 81 MG tablet Take 1 tablet (81 mg total) by mouth daily. Swallow whole. 90 tablet 3   docusate sodium (COLACE) 100 MG capsule Take 100 mg by mouth daily as needed for mild constipation.     empagliflozin (JARDIANCE) 10 MG TABS tablet Take 1 tablet (10 mg total) by mouth daily. 90 tablet 3   isosorbide-hydrALAZINE (BIDIL) 20-37.5 MG tablet Take 1 tablet by mouth 3 (three) times daily. 90 tablet 6   Multiple Vitamin (MULTIVITAMIN) capsule Take 1 capsule by mouth daily.     Omega-3 Fatty Acids (FISH OIL) 1000 MG CAPS Take by mouth.     Pembrolizumab (  KEYTRUDA IV) Inject 1 Dose into the vein once a week. Every 3  weeks     potassium chloride (KLOR-CON M) 10 MEQ tablet Take 1 tablet (10 mEq total) by mouth daily. 30 tablet 3   PREVIDENT 5000 ENAMEL PROTECT 1.1-5 % GEL Place 1 application. onto teeth at bedtime.     Probiotic Product (UP4 PROBIOTICS ADULT PO) Take by mouth.     prochlorperazine (COMPAZINE) 10 MG tablet Take 1 tablet (10 mg total) by mouth every 6 (six) hours as needed for nausea or vomiting. 30 tablet 0   promethazine (PHENERGAN) 12.5 MG tablet Take 1 tablet (12.5 mg total) by mouth every 4 (four) hours as needed for nausea or vomiting. 15  tablet 0   rosuvastatin (CRESTOR) 40 MG tablet Take 1 tablet (40 mg total) by mouth daily. 90 tablet 3   torsemide (DEMADEX) 20 MG tablet Take 1 tablet (20 mg total) by mouth daily. 30 tablet 6   No current facility-administered medications for this encounter.   No Known Allergies  Social History   Socioeconomic History   Marital status: Married    Spouse name: Not on file   Number of children: Not on file   Years of education: Not on file   Highest education level: Not on file  Occupational History   Not on file  Tobacco Use   Smoking status: Former    Types: Pipe    Quit date: 02/05/1991    Years since quitting: 30.5   Smokeless tobacco: Not on file  Substance and Sexual Activity   Alcohol use: Not on file   Drug use: Not on file   Sexual activity: Not on file  Other Topics Concern   Not on file  Social History Narrative   Not on file   Social Determinants of Health   Financial Resource Strain: Low Risk  (04/02/2021)   Overall Financial Resource Strain (CARDIA)    Difficulty of Paying Living Expenses: Not hard at all  Food Insecurity: No Food Insecurity (04/02/2021)   Hunger Vital Sign    Worried About Running Out of Food in the Last Year: Never true    [place] in the Last Year: Never true  Transportation Needs: No Transportation Needs (04/02/2021)   PRAPARE - Hydrologist (Medical): No    Lack of Transportation (Non-Medical): No  Physical Activity: Not on file  Stress: Not on file  Social Connections: Not on file  Intimate Partner Violence: Not on file   Family History  Problem Relation Age of Onset   Heart attack Father    Wt Readings from Last 3 Encounters:  09/03/21 99.6 kg (219 lb 9.6 oz)  08/19/21 98.2 kg (216 lb 8 oz)  08/07/21 97.1 kg (214 lb)   BP 110/60   Pulse 77   Wt 99.6 kg (219 lb 9.6 oz)   SpO2 95%   BMI 31.51 kg/m   PHYSICAL EXAM: General:  Well appearing. No resp difficulty HEENT: normal Neck:  supple. no JVD. Carotids 2+ bilat; no bruits. No lymphadenopathy or thryomegaly appreciated. Cor: PMI nondisplaced. Regular rate & rhythm. No rubs, gallops or murmurs. Lungs: clear Abdomen: soft, nontender, nondistended. No hepatosplenomegaly. No bruits or masses. Good bowel sounds. Extremities: no cyanosis, clubbing, rash, edema Neuro: alert & orientedx3, cranial nerves grossly intact. moves all 4 extremities w/o difficulty. Affect pleasant   ASSESSMENT & PLAN:  1. Chronic systolic HF - Echo 3/00 EF 20-25% with anterior/apical WMA -  R/LHC: Minimal non-obstructive CAD with separate left coronary ostia, severe NICM suspect due to LBBB, and elevated filling pressures with normal cardiac output.  - cMRI: LVEF 10%, RV systolic function  31%, basal septal midwall late gadolinium enhancement (scar pattern seen in NICM and associated with worse prognosis), RV insertion LGE  - Readmit ADHF/shock 3/23. Echo EF 20% Briefly required milrinone.  - Echo 05/29/21 EF <20% LV markedly dilated. RV normal. Mild MR.  - Stable NYHA II. Volume status stable  - Continue torsemide '20mg'$  daily  - Continue Jardiance 10 mg daily.  - Hold spiro and entresto due to elevated creatinine  - Continue digoxin 0.125 - Continue BIDI 1 tab tid. Avoid hypotension -  Refer to EP for CRT-D   2. PVCs/NSVT - He has decided not to wear LifeVest - Referring to EP for CRT-D   3. DM2 - A1c 5.4% - On SGLT2i. - Per PCP   4. CKD IV - Continue Jardiance - Scr now ~2.5 - Recent labs reviewed   5. OSA - Continue CPAP. - Follows with Dr. Maxwell Caul at [facility].   6. Minimal non-obstructive CAD - On ASA/statin. - No s/s angina - LDL 74.   7. LBBB  - EF remains depressed despite maximal GDMT - suspect LBBB-CM - Will refer back to EP for CRT-D  8. Large right renal cell CA - 06/2021 S/P R Nephrectomy    Glori Bickers, MD  3:28 PM

## 2021-09-04 ENCOUNTER — Other Ambulatory Visit: Payer: Self-pay

## 2021-09-09 ENCOUNTER — Inpatient Hospital Stay: Payer: Medicare PPO

## 2021-09-09 ENCOUNTER — Other Ambulatory Visit: Payer: Self-pay

## 2021-09-09 ENCOUNTER — Inpatient Hospital Stay: Payer: Medicare PPO | Attending: Oncology | Admitting: Oncology

## 2021-09-09 VITALS — BP 97/68 | HR 79 | Temp 97.8°F | Resp 18

## 2021-09-09 VITALS — BP 111/71 | HR 85 | Temp 97.9°F | Resp 17 | Ht 70.0 in | Wt 216.2 lb

## 2021-09-09 DIAGNOSIS — Z7982 Long term (current) use of aspirin: Secondary | ICD-10-CM | POA: Diagnosis not present

## 2021-09-09 DIAGNOSIS — C641 Malignant neoplasm of right kidney, except renal pelvis: Secondary | ICD-10-CM | POA: Insufficient documentation

## 2021-09-09 DIAGNOSIS — Z79899 Other long term (current) drug therapy: Secondary | ICD-10-CM | POA: Insufficient documentation

## 2021-09-09 DIAGNOSIS — Z5112 Encounter for antineoplastic immunotherapy: Secondary | ICD-10-CM | POA: Insufficient documentation

## 2021-09-09 DIAGNOSIS — Z905 Acquired absence of kidney: Secondary | ICD-10-CM | POA: Diagnosis not present

## 2021-09-09 DIAGNOSIS — Z7984 Long term (current) use of oral hypoglycemic drugs: Secondary | ICD-10-CM | POA: Insufficient documentation

## 2021-09-09 LAB — CBC WITH DIFFERENTIAL (CANCER CENTER ONLY)
Abs Immature Granulocytes: 0.01 10*3/uL (ref 0.00–0.07)
Basophils Absolute: 0 10*3/uL (ref 0.0–0.1)
Basophils Relative: 1 %
Eosinophils Absolute: 0.1 10*3/uL (ref 0.0–0.5)
Eosinophils Relative: 3 %
HCT: 33.3 % — ABNORMAL LOW (ref 39.0–52.0)
Hemoglobin: 12 g/dL — ABNORMAL LOW (ref 13.0–17.0)
Immature Granulocytes: 0 %
Lymphocytes Relative: 20 %
Lymphs Abs: 1 10*3/uL (ref 0.7–4.0)
MCH: 28.6 pg (ref 26.0–34.0)
MCHC: 36 g/dL (ref 30.0–36.0)
MCV: 79.3 fL — ABNORMAL LOW (ref 80.0–100.0)
Monocytes Absolute: 0.9 10*3/uL (ref 0.1–1.0)
Monocytes Relative: 19 %
Neutro Abs: 2.8 10*3/uL (ref 1.7–7.7)
Neutrophils Relative %: 57 %
Platelet Count: 156 10*3/uL (ref 150–400)
RBC: 4.2 MIL/uL — ABNORMAL LOW (ref 4.22–5.81)
RDW: 15 % (ref 11.5–15.5)
WBC Count: 4.9 10*3/uL (ref 4.0–10.5)
nRBC: 0 % (ref 0.0–0.2)

## 2021-09-09 LAB — CMP (CANCER CENTER ONLY)
ALT: 17 U/L (ref 0–44)
AST: 23 U/L (ref 15–41)
Albumin: 4.5 g/dL (ref 3.5–5.0)
Alkaline Phosphatase: 50 U/L (ref 38–126)
Anion gap: 8 (ref 5–15)
BUN: 58 mg/dL — ABNORMAL HIGH (ref 8–23)
CO2: 25 mmol/L (ref 22–32)
Calcium: 10.7 mg/dL — ABNORMAL HIGH (ref 8.9–10.3)
Chloride: 104 mmol/L (ref 98–111)
Creatinine: 2.36 mg/dL — ABNORMAL HIGH (ref 0.61–1.24)
GFR, Estimated: 28 mL/min — ABNORMAL LOW (ref >60)
Glucose, Bld: 137 mg/dL — ABNORMAL HIGH (ref 70–99)
Potassium: 4.3 mmol/L (ref 3.5–5.1)
Sodium: 137 mmol/L (ref 135–145)
Total Bilirubin: 0.8 mg/dL (ref 0.3–1.2)
Total Protein: 7.8 g/dL (ref 6.5–8.1)

## 2021-09-09 MED ORDER — SODIUM CHLORIDE 0.9 % IV SOLN: Freq: Once | INTRAVENOUS | Status: AC

## 2021-09-09 MED ORDER — SODIUM CHLORIDE 0.9 % IV SOLN
200.0000 mg | Freq: Once | INTRAVENOUS | Status: AC
  Administered 2021-09-09: 200 mg via INTRAVENOUS
  Filled 2021-09-09: qty 200

## 2021-09-09 NOTE — Progress Notes (Signed)
Hematology and Oncology Follow Up Visit  Bryce Cain [id number] Sep 03, 1950 71 y.o. 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  Current therapy: Pembrolizumab 200 mg every 3 weeks started on August 19, 2021.  He is here for cycle 2 of therapy with a plan to complete 17 cycles for 1 year of treatment.  Interim History: Mr. Bryce Cain returns today for a follow-up.  Since the last visit, he reports no major changes in his health.  He has tolerated treatment very well without any complaints.  He denies any nausea, vomiting or abdominal pain.  He denies any excessive fatigue or tiredness.  He denies any skin rash or changes in his bowels.     Medications: I have reviewed the patient's current medications.  Current Outpatient Medications  Medication Sig Dispense Refill   acetaminophen (TYLENOL) 500 MG tablet Take 500 mg by mouth every 6 (six) hours as needed.     aspirin EC 81 MG tablet Take 1 tablet (81 mg total) by mouth daily. Swallow whole. 90 tablet 3   docusate sodium (COLACE) 100 MG capsule Take 100 mg by mouth daily as needed for mild constipation.     empagliflozin (JARDIANCE) 10 MG TABS tablet Take 1 tablet (10 mg total) by mouth daily. 90 tablet 3   isosorbide-hydrALAZINE (BIDIL) 20-37.5 MG tablet Take 1 tablet by mouth 3 (three) times daily. 90 tablet 6   Multiple Vitamin (MULTIVITAMIN) capsule Take 1 capsule by mouth daily.     Omega-3 Fatty Acids (FISH OIL) 1000 MG CAPS Take by mouth.     Pembrolizumab (KEYTRUDA IV) Inject 1 Dose into the vein once a week. Every 3  weeks      potassium chloride (KLOR-CON M) 10 MEQ tablet Take 1 tablet (10 mEq total) by mouth daily. 30 tablet 3   PREVIDENT 5000 ENAMEL PROTECT 1.1-5 % GEL Place 1 application. onto teeth at bedtime.     Probiotic Product (UP4 PROBIOTICS ADULT PO) Take by mouth.     prochlorperazine (COMPAZINE) 10 MG tablet Take 1 tablet (10 mg total) by mouth every 6 (six) hours as needed for nausea or vomiting. 30 tablet 0   promethazine (PHENERGAN) 12.5 MG tablet Take 1 tablet (12.5 mg total) by mouth every 4 (four) hours as needed for nausea or vomiting. 15 tablet 0   rosuvastatin (CRESTOR) 40 MG tablet Take 1 tablet (40 mg total) by mouth daily. 90 tablet 3   torsemide (DEMADEX) 20 MG tablet Take 1 tablet (20 mg total) by mouth daily. 30 tablet 6   No current facility-administered medications for this visit.     Allergies: No Known Allergies    Physical Exam: Blood pressure 111/71, pulse 85, temperature 97.9 F (36.6 C), temperature source Temporal, resp. rate 17, height '5\' 10"'$  (1.778 m), weight 216 lb 3.2 oz (98.1 kg), SpO2 98 %.  ECOG: 1    General appearance: Comfortable appearing without any discomfort Head: Normocephalic without any trauma Oropharynx: Mucous membranes are moist and pink without any thrush or ulcers. Eyes: Pupils are equal and round reactive to light. Lymph nodes: No cervical, supraclavicular, inguinal or axillary lymphadenopathy.  Heart:regular rate and rhythm.  S1 and S2 without leg edema. Lung: Clear without any rhonchi or wheezes.  No dullness to percussion. Abdomin: Soft, nontender, nondistended with good bowel sounds.  No hepatosplenomegaly. Musculoskeletal: No joint deformity or effusion.  Full range of motion noted. Neurological: No deficits noted on motor, sensory and deep tendon reflex exam. Skin: No petechial rash or dryness.  Appeared moist.      Lab Results: Lab Results  Component Value Date   WBC 4.3 08/19/2021   HGB 11.4 (L) 08/19/2021   HCT 31.4 (L)  08/19/2021   MCV 79.9 (L) 08/19/2021   PLT 145 (L) 08/19/2021     Chemistry      Component Value Date/Time   NA 138 08/19/2021 1236   NA 138 07/10/2021 0000   K 4.1 08/19/2021 1236   CL 110 08/19/2021 1236   CO2 23 08/19/2021 1236   BUN 49 (H) 08/19/2021 1236   BUN 36 (A) 07/10/2021 0000   CREATININE 2.45 (H) 08/19/2021 1236   GLU 98 07/10/2021 0000      Component Value Date/Time   CALCIUM 10.0 08/19/2021 1236   ALKPHOS 41 08/19/2021 1236   AST 21 08/19/2021 1236   ALT 15 08/19/2021 1236   BILITOT 0.6 08/19/2021 1236          Impression and Plan:  71 year old man with with:  1.  Right kidney cancer diagnosed in June 2023.  He was found to have T3a clear-cell carcinoma, nuclear grade 4 without any evidence of metastatic disease.   He has received Pembrolizumab 200 mg without any major complications here for the second cycle of therapy.  Risks and benefits of proceeding with cycle 2 of therapy were discussed.  Complications that include autoimmune considerations, GI toxicity among others were reiterated.  The plan is to complete 17 cycles or 1 year of therapy.  We will continue to have surveillance imaging studies every 6 months.      2.  IV access: No complications noted to peripheral veins.  Port-A-Cath option will be deferred.     3.  Autoimmune complications: I continue to educate him about these complications including pneumonitis, colitis and thyroid disease.  4.  Congestive heart failure: His ejection fraction is between 20 to 25%.  No hospitalizations or decompensation noted recently.  He continues to follow with Dr. Haroldine Laws   5.  Follow-up: In 3 weeks for the next cycle of therapy.     30  minutes were spent on this encounter.  The time was dedicated to reviewing laboratory data, disease status update Future plan of care discussion.     Zola Button, MD 9/6/202311:30 AM

## 2021-09-09 NOTE — Patient Instructions (Signed)
[facility] MEDICAL [facility]COLOG[facility]  Discharge Instructions: Thank you for choosing [facility] to provide your oncology and hematology care.   If you have a lab appointment with the Cancer Center, please go directly to the Cancer Center and check in at the registration area.   Wear comfortable clothing and clothing appropriate for easy access to any Portacath or PICC line.   We strive to give you quality time with your provider. You may need to reschedule your appointment if you arrive late (15 or more minutes).  Arriving late affects you and other patients whose appointments are after yours.  Also, if you miss three or more appointments without notifying the office, you may be dismissed from the clinic at the provider's discretion.      For prescription refill requests, have your pharmacy contact our office and allow 72 hours for refills to be completed.    Today you received the following chemotherapy and/or immunotherapy agents: Keytruda.       To help prevent nausea and vomiting after your treatment, we encourage you to take your nausea medication as directed.  BELOW ARE SYMPTOMS THAT SHOULD BE REPORTED IMMEDIATELY: *FEVER GREATER THAN 100.4 F (38 C) OR HIGHER *CHILLS OR SWEATING *NAUSEA AND VOMITING THAT IS NOT CONTROLLED WITH YOUR NAUSEA MEDICATION *UNUSUAL SHORTNESS OF BREATH *UNUSUAL BRUISING OR BLEEDING *URINARY PROBLEMS (pain or burning when urinating, or frequent urination) *BOWEL PROBLEMS (unusual diarrhea, constipation, pain near the anus) TENDERNESS IN MOUTH AND THROAT WITH OR WITHOUT PRESENCE OF ULCERS (sore throat, sores in mouth, or a toothache) UNUSUAL RASH, SWELLING OR PAIN  UNUSUAL VAGINAL DISCHARGE OR ITCHING   Items with * indicate a potential emergency and should be followed up as soon as possible or go to the Emergency Department if any problems should occur.  Please show the CHEMOTHERAPY ALERT CARD or IMMUNOTHERAPY ALERT CARD at check-in to  the Emergency Department and triage nurse.  Should you have questions after your visit or need to cancel or reschedule your appointment, please contact [facility]  Dept: [phone]  and follow the prompts.  Office hours are 8:00 a.m. to 4:30 p.m. Monday - Friday. Please note that voicemails left after 4:00 p.m. may not be returned until the following business day.  We are closed weekends and major holidays. You have access to a nurse at all times for urgent questions. Please call the main number to the clinic Dept: [phone] and follow the prompts.   For any non-urgent questions, you may also contact your provider using MyChart. We now offer e-Visits for anyone 18 and older to request care online for non-urgent symptoms. For details visit [email or web address].   Also download the MyChart app! Go to the app store, search "MyChart", open the app, select [place], and log in with your MyChart username and password.  Masks are optional in the cancer centers. If you would like for your care team to wear a mask while they are taking care of you, please let them know. You may have one support person who is at least 73 years old accompany you for your appointments. 

## 2021-09-09 NOTE — Progress Notes (Signed)
Per Dr. Alen Blew OK to proceed with tx today with elevated SCR 2.63 mg/dL

## 2021-09-22 ENCOUNTER — Telehealth (HOSPITAL_COMMUNITY): Payer: Self-pay | Admitting: *Deleted

## 2021-09-22 ENCOUNTER — Telehealth: Payer: Self-pay | Admitting: *Deleted

## 2021-09-22 NOTE — Telephone Encounter (Signed)
Pt is scheduled to have his teeth cleaned tomorrow at 1pm. Dr.Hazelgrace Bonham is calling for clearance and see if he needs any pre med antibiotics.   Call back # [phone]  Routed to [place] for advice

## 2021-09-22 NOTE — Telephone Encounter (Signed)
PC to Jeannie Done, periodontist,  informed her Dr Alen Blew states patient may have dental cleaning & evaluation tomorrow.

## 2021-09-22 NOTE — Telephone Encounter (Signed)
-----   Message from Wyatt Portela, MD sent at 09/22/2021  4:19 PM EDT ----- Yes ----- Message ----- From: Rolene Course, RN Sent: 09/22/2021   4:07 PM EDT To: Wyatt Portela, MD  This patient's periodontist Jeannie Done) called & is asking if he can have dental clearance for a cleaning & evaluation, no oral surgery.  He is to have a pacemaker placed next month & definitely needs an oral cleaning first.  She has an appointment available tomorrow, are they ok to proceed?

## 2021-09-22 NOTE — Telephone Encounter (Signed)
Dr.Baily Hovanec aware that patient is cleared and no antibiotics needed per Dr.Bensimhon. note faxed to [phone]

## 2021-09-25 ENCOUNTER — Ambulatory Visit: Payer: Medicare PPO | Admitting: Physician Assistant

## 2021-09-25 NOTE — Progress Notes (Deleted)
Cardiology Office Note Date:  09/25/2021  Patient ID:  Bryce Cain, Bryce Cain [birth year], MRN [id number] PCP:  [name], [name], MD  Cardiologist:  Dr. Haroldine Laws Electrophysiologist: Dr. Quentin Ore  ***refresh   Chief Complaint: *** eval for ICD  History of Present Illness: Bryce Cain is a 71 y.o. male with history of HTN, DM, OSA (w/CPAP), LBBB, NICM, CKD (IIIa)  Feb - march 2023, a couple hospitalizations with HF, one requiring milrinone.  EP consulted, planned to follow out pt after GDMT was maximized and plan for device  He saw Dr. Quentin Ore 05/19/21 to discus CRT-D timing post hospitalization, though unfortunately had in the interim been found with RCC and was pending R nephrectomy. Was recommended that he get past this prior to implant to reduce infection risk.  He had his R nephrectomy on 06/23/21 did require briefly ow dose pressor post -op did well, discharged 06/26/21 on only jardiance and dig 2/2 his BP (held Thomson, spiro, and torsemide)  Hospitalized 07/19/21, with progressive SOB/DOE acute/chronic CHF exacerbation, AKIB/CKD limits GDMT with new baseline Creat >2 post nephrectomy. Diuresed and discharged 07/22/21  He has seen HF team a couple times, Dr. Vaughan Browner last on 09/03/21, was doing well, active but not exercising.  Creat stuck about 2.5, unable to keep Entresto, aldactone on board. Planned for EP re-visit to discuss ICD timing.   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 *** symptoms   Past Medical History:  Diagnosis Date   CHF (congestive heart failure) (HCC)    DM (diabetes mellitus), type 2 (HCC)    OSA on CPAP     Past Surgical History:  Procedure Laterality Date   RIGHT/LEFT HEART CATH AND CORONARY ANGIOGRAPHY N/A 02/16/2021   Procedure: RIGHT/LEFT HEART CATH AND CORONARY ANGIOGRAPHY;  Surgeon: Jolaine Artist, MD;  Location: [facility];  Service: Cardiovascular;  Laterality: N/A;   ROBOT ASSISTED LAPAROSCOPIC NEPHRECTOMY Right  06/23/2021   Procedure: XI ROBOTIC ASSISTED LAPAROSCOPIC NEPHRECTOMY;  Surgeon: Ceasar Mons, MD;  Location: [facility];  Service: Urology;  Laterality: Right;    Current Outpatient Medications  Medication Sig Dispense Refill   acetaminophen (TYLENOL) 500 MG tablet Take 500 mg by mouth every 6 (six) hours as needed.     aspirin EC 81 MG tablet Take 1 tablet (81 mg total) by mouth daily. Swallow whole. 90 tablet 3   docusate sodium (COLACE) 100 MG capsule Take 100 mg by mouth daily as needed for mild constipation.     empagliflozin (JARDIANCE) 10 MG TABS tablet Take 1 tablet (10 mg total) by mouth daily. 90 tablet 3   isosorbide-hydrALAZINE (BIDIL) 20-37.5 MG tablet Take 1 tablet by mouth 3 (three) times daily. 90 tablet 6   Multiple Vitamin (MULTIVITAMIN) capsule Take 1 capsule by mouth daily.     Omega-3 Fatty Acids (FISH OIL) 1000 MG CAPS Take by mouth.     Pembrolizumab (KEYTRUDA IV) Inject 1 Dose into the vein once a week. Every 3  weeks     potassium chloride (KLOR-CON M) 10 MEQ tablet Take 1 tablet (10 mEq total) by mouth daily. 30 tablet 3   PREVIDENT 5000 ENAMEL PROTECT 1.1-5 % GEL Place 1 application. onto teeth at bedtime.     Probiotic Product (UP4 PROBIOTICS ADULT PO) Take by mouth.     prochlorperazine (COMPAZINE) 10 MG tablet Take 1 tablet (10 mg total) by mouth every 6 (six) hours as needed for nausea or vomiting. 30 tablet 0  promethazine (PHENERGAN) 12.5 MG tablet Take 1 tablet (12.5 mg total) by mouth every 4 (four) hours as needed for nausea or vomiting. 15 tablet 0   rosuvastatin (CRESTOR) 40 MG tablet Take 1 tablet (40 mg total) by mouth daily. 90 tablet 3   torsemide (DEMADEX) 20 MG tablet Take 1 tablet (20 mg total) by mouth daily. 30 tablet 6   No current facility-administered medications for this visit.    Allergies:   Patient has no known allergies.   Social History:  The patient  reports that he quit smoking about 30 years ago. His smoking use included  pipe. He does not have any smokeless tobacco history on file.   Family History:  The patient's family history includes Heart attack in his father.  ROS:  Please see the history of present illness.    All other systems are reviewed and otherwise negative.   PHYSICAL EXAM:  VS:  There were no vitals taken for this visit. BMI: There is no height or weight on file to calculate BMI. 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  - cMRI 2/23: LVEF 03%, RV systolic function  49%, basal septal midwall late gadolinium enhancement (scar pattern seen in NICM and associated with worse prognosis), RV insertion LGE  - Echo (05/29/21): EF <20% LV markedly dilated. RV normal. Mild MR.  Recent Labs: 07/19/2021: B Natriuretic Peptide 3,130.5 07/22/2021: Magnesium 1.8 08/19/2021: TSH 1.483 09/09/2021: ALT 17; BUN 58; Creatinine 2.36; Hemoglobin 12.0; Platelet Count 156; Potassium 4.3; Sodium 137  02/15/2021: Cholesterol 137; HDL 55; LDL Cholesterol 74; Total CHOL/HDL Ratio 2.5; Triglycerides 41; VLDL 8   CrCl cannot be calculated (Unknown ideal weight.).   Wt Readings from Last 3 Encounters:  09/09/21 216 lb 3.2 oz (98.1 kg)  09/03/21 219 lb 9.6 oz (99.6 kg)  08/19/21 216 lb 8 oz (98.2 kg)     Other studies reviewed: Additional studies/records reviewed today include: summarized  above  ASSESSMENT AND PLAN:  ***  Disposition: F/u with ***  Current medicines are reviewed at length with the patient today.  The patient did not have any concerns regarding medicines.  Venetia Night, PA-C 09/25/2021 4:01 AM     [facility] [address] [place]  [id number] [phone] (office)  [phone] (fax)

## 2021-09-30 ENCOUNTER — Inpatient Hospital Stay: Payer: Medicare PPO

## 2021-09-30 ENCOUNTER — Other Ambulatory Visit: Payer: Self-pay

## 2021-09-30 ENCOUNTER — Inpatient Hospital Stay (HOSPITAL_BASED_OUTPATIENT_CLINIC_OR_DEPARTMENT_OTHER): Payer: Medicare PPO | Admitting: Oncology

## 2021-09-30 VITALS — BP 99/63 | HR 76 | Temp 97.8°F | Resp 18 | Ht 70.0 in | Wt 216.0 lb

## 2021-09-30 DIAGNOSIS — C641 Malignant neoplasm of right kidney, except renal pelvis: Secondary | ICD-10-CM

## 2021-09-30 DIAGNOSIS — Z7984 Long term (current) use of oral hypoglycemic drugs: Secondary | ICD-10-CM | POA: Diagnosis not present

## 2021-09-30 DIAGNOSIS — Z5112 Encounter for antineoplastic immunotherapy: Secondary | ICD-10-CM | POA: Diagnosis not present

## 2021-09-30 DIAGNOSIS — Z7982 Long term (current) use of aspirin: Secondary | ICD-10-CM | POA: Diagnosis not present

## 2021-09-30 DIAGNOSIS — Z79899 Other long term (current) drug therapy: Secondary | ICD-10-CM | POA: Diagnosis not present

## 2021-09-30 DIAGNOSIS — Z905 Acquired absence of kidney: Secondary | ICD-10-CM | POA: Diagnosis not present

## 2021-09-30 LAB — CMP (CANCER CENTER ONLY)
ALT: 26 U/L (ref 0–44)
AST: 28 U/L (ref 15–41)
Albumin: 3.9 g/dL (ref 3.5–5.0)
Alkaline Phosphatase: 42 U/L (ref 38–126)
Anion gap: 7 (ref 5–15)
BUN: 54 mg/dL — ABNORMAL HIGH (ref 8–23)
CO2: 23 mmol/L (ref 22–32)
Calcium: 9.8 mg/dL (ref 8.9–10.3)
Chloride: 108 mmol/L (ref 98–111)
Creatinine: 2.44 mg/dL — ABNORMAL HIGH (ref 0.61–1.24)
GFR, Estimated: 27 mL/min — ABNORMAL LOW (ref >60)
Glucose, Bld: 89 mg/dL (ref 70–99)
Potassium: 4.3 mmol/L (ref 3.5–5.1)
Sodium: 138 mmol/L (ref 135–145)
Total Bilirubin: 0.7 mg/dL (ref 0.3–1.2)
Total Protein: 7.2 g/dL (ref 6.5–8.1)

## 2021-09-30 LAB — CBC WITH DIFFERENTIAL (CANCER CENTER ONLY)
Abs Immature Granulocytes: 0.01 10*3/uL (ref 0.00–0.07)
Basophils Absolute: 0 10*3/uL (ref 0.0–0.1)
Basophils Relative: 1 %
Eosinophils Absolute: 0.2 10*3/uL (ref 0.0–0.5)
Eosinophils Relative: 4 %
HCT: 34.2 % — ABNORMAL LOW (ref 39.0–52.0)
Hemoglobin: 12.1 g/dL — ABNORMAL LOW (ref 13.0–17.0)
Immature Granulocytes: 0 %
Lymphocytes Relative: 20 %
Lymphs Abs: 0.9 10*3/uL (ref 0.7–4.0)
MCH: 27.9 pg (ref 26.0–34.0)
MCHC: 35.4 g/dL (ref 30.0–36.0)
MCV: 79 fL — ABNORMAL LOW (ref 80.0–100.0)
Monocytes Absolute: 0.8 10*3/uL (ref 0.1–1.0)
Monocytes Relative: 18 %
Neutro Abs: 2.6 10*3/uL (ref 1.7–7.7)
Neutrophils Relative %: 57 %
Platelet Count: 161 10*3/uL (ref 150–400)
RBC: 4.33 MIL/uL (ref 4.22–5.81)
RDW: 14.7 % (ref 11.5–15.5)
WBC Count: 4.6 10*3/uL (ref 4.0–10.5)
nRBC: 0 % (ref 0.0–0.2)

## 2021-09-30 LAB — TSH: TSH: 0.023 u[IU]/mL — ABNORMAL LOW (ref 0.350–4.500)

## 2021-09-30 MED ORDER — SODIUM CHLORIDE 0.9 % IV SOLN: Freq: Once | INTRAVENOUS | Status: AC

## 2021-09-30 MED ORDER — SODIUM CHLORIDE 0.9 % IV SOLN
200.0000 mg | Freq: Once | INTRAVENOUS | Status: AC
  Administered 2021-09-30: 200 mg via INTRAVENOUS
  Filled 2021-09-30: qty 200

## 2021-09-30 NOTE — Progress Notes (Signed)
Hematology and Oncology Follow Up Visit  Bryce Cain [id number] 23-Jun-1950 71 y.o. 09/30/2021 2:42 PM Bryce Cain, Bryce Cain, MDHernandez Lucilla Edin*   Principle Diagnosis: 71 year old man with kidney cancer diagnosed and June 2023.  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    Prior Therapy: Robotic assisted laparoscopic right radical nephrectomy on June 23, 2021.  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  Current therapy: Pembrolizumab 200 mg every 3 weeks started on August 19, 2021.  He is here for cycle 3 to complete 1 year in total 17 cycles.   Interim History: Bryce Cain returns today for a repeat evaluation.  Since the last visit, he reports feeling well without any major complaints.  He has tolerated treatment without any issues or considerations.  He denies any skin rashes or lesions.  He denies any excessive fatigue or tiredness.  He denies any worsening diarrhea or respiratory complaints.     Medications: Updated on review. Current Outpatient Medications  Medication Sig Dispense Refill   acetaminophen (TYLENOL) 500 MG tablet Take 500 mg by mouth every 6 (six) hours as needed.     aspirin EC 81 MG tablet Take 1 tablet (81 mg total) by mouth daily. Swallow whole. 90 tablet 3   docusate sodium (COLACE) 100 MG capsule Take 100 mg by mouth daily as needed for mild constipation.     empagliflozin (JARDIANCE) 10 MG TABS tablet Take 1 tablet (10 mg total) by mouth daily. 90 tablet 3   isosorbide-hydrALAZINE (BIDIL) 20-37.5 MG tablet Take 1 tablet by mouth 3 (three) times daily. 90 tablet 6   Multiple Vitamin (MULTIVITAMIN) capsule Take 1 capsule by mouth daily.     Omega-3 Fatty Acids (FISH OIL) 1000 MG CAPS Take by mouth.     Pembrolizumab (KEYTRUDA IV) Inject 1 Dose into the vein once a week. Every 3  weeks     potassium chloride (KLOR-CON M) 10 MEQ tablet Take 1  tablet (10 mEq total) by mouth daily. 30 tablet 3   PREVIDENT 5000 ENAMEL PROTECT 1.1-5 % GEL Place 1 application. onto teeth at bedtime.     Probiotic Product (UP4 PROBIOTICS ADULT PO) Take by mouth.     prochlorperazine (COMPAZINE) 10 MG tablet Take 1 tablet (10 mg total) by mouth every 6 (six) hours as needed for nausea or vomiting. 30 tablet 0   promethazine (PHENERGAN) 12.5 MG tablet Take 1 tablet (12.5 mg total) by mouth every 4 (four) hours as needed for nausea or vomiting. 15 tablet 0   rosuvastatin (CRESTOR) 40 MG tablet Take 1 tablet (40 mg total) by mouth daily. 90 tablet 3   torsemide (DEMADEX) 20 MG tablet Take 1 tablet (20 mg total) by mouth daily. 30 tablet 6   No current facility-administered medications for this visit.     Allergies: No Known Allergies    Physical Exam: Blood pressure 99/63, pulse 76, temperature 97.8 F (36.6 C), temperature source Temporal, resp. rate 18, height '5\' 10"'$  (1.778 m), weight 216 lb (98 kg), SpO2 97 %.   ECOG: 1   General appearance: Alert, awake without any distress. Head: Atraumatic without abnormalities Oropharynx: Without any thrush or ulcers. Eyes: No scleral icterus. Lymph nodes: No lymphadenopathy noted in the cervical, supraclavicular, or axillary nodes Heart:regular rate and rhythm, without any murmurs or gallops.   Lung: Clear to auscultation without any rhonchi, wheezes or dullness to percussion. Abdomin:  Soft, nontender without any shifting dullness or ascites. Musculoskeletal: No clubbing or cyanosis. Neurological: No motor or sensory deficits. Skin: No rashes or lesions.     Lab Results: Lab Results  Component Value Date   WBC 4.6 09/30/2021   HGB 12.1 (L) 09/30/2021   HCT 34.2 (L) 09/30/2021   MCV 79.0 (L) 09/30/2021   PLT 161 09/30/2021     Chemistry      Component Value Date/Time   NA 137 09/09/2021 1128   NA 138 07/10/2021 0000   K 4.3 09/09/2021 1128   CL 104 09/09/2021 1128   CO2 25 09/09/2021  1128   BUN 58 (H) 09/09/2021 1128   BUN 36 (A) 07/10/2021 0000   CREATININE 2.36 (H) 09/09/2021 1128   GLU 98 07/10/2021 0000      Component Value Date/Time   CALCIUM 10.7 (H) 09/09/2021 1128   ALKPHOS 50 09/09/2021 1128   AST 23 09/09/2021 1128   ALT 17 09/09/2021 1128   BILITOT 0.8 09/09/2021 1128          Impression and Plan:  71 year old man with with:  1.  Kidney cancer diagnosed in June 2023.  He was found to have T3a clear-cell carcinoma, nuclear grade 4 without any evidence of metastatic disease.   The natural course of his disease was reviewed and risks and benefits of continuing Pembrolizumab were discussed at this time.  Autoimmune complications as well as GI toxicity and dermatological issues were reiterated.  He is agreeable to proceed.  He will require staging scans in the future which likely will be scheduled under the care of Dr. Lovena Cain.     2.  IV access: Port-A-Cath option has been deferred for the time being.  Peripheral veins are currently in use.     3.  Autoimmune complications: He has not experienced any at this time.  These include pneumonitis, colitis and thyroid disease.  4.  Congestive heart failure: No signs symptoms of heart failure at this time.   5.  Follow-up: We will return in 3 weeks for a follow-up.     30  minutes were dedicated to this visit.  The time was spent on reviewing his disease status, treatment choices and addressing complication related to his therapy.     Bryce Button, MD 9/27/20232:42 PM

## 2021-09-30 NOTE — Patient Instructions (Signed)
[facility]  Discharge Instructions: Thank you for choosing [facility] to provide your oncology and hematology care.   If you have a lab appointment with the Cancer Center, please go directly to the Cancer Center and check in at the registration area.   Wear comfortable clothing and clothing appropriate for easy access to any Portacath or PICC line.   We strive to give you quality time with your provider. You may need to reschedule your appointment if you arrive late (15 or more minutes).  Arriving late affects you and other patients whose appointments are after yours.  Also, if you miss three or more appointments without notifying the office, you may be dismissed from the clinic at the provider's discretion.      For prescription refill requests, have your pharmacy contact our office and allow 72 hours for refills to be completed.    Today you received the following chemotherapy and/or immunotherapy agents: Keytruda.       To help prevent nausea and vomiting after your treatment, we encourage you to take your nausea medication as directed.  BELOW ARE SYMPTOMS THAT SHOULD BE REPORTED IMMEDIATELY: *FEVER GREATER THAN 100.4 F (38 C) OR HIGHER *CHILLS OR SWEATING *NAUSEA AND VOMITING THAT IS NOT CONTROLLED WITH YOUR NAUSEA MEDICATION *UNUSUAL SHORTNESS OF BREATH *UNUSUAL BRUISING OR BLEEDING *URINARY PROBLEMS (pain or burning when urinating, or frequent urination) *BOWEL PROBLEMS (unusual diarrhea, constipation, pain near the anus) TENDERNESS IN MOUTH AND THROAT WITH OR WITHOUT PRESENCE OF ULCERS (sore throat, sores in mouth, or a toothache) UNUSUAL RASH, SWELLING OR PAIN  UNUSUAL VAGINAL DISCHARGE OR ITCHING   Items with * indicate a potential emergency and should be followed up as soon as possible or go to the Emergency Department if any problems should occur.  Please show the CHEMOTHERAPY ALERT CARD or IMMUNOTHERAPY ALERT CARD at check-in to  the Emergency Department and triage nurse.  Should you have questions after your visit or need to cancel or reschedule your appointment, please contact [facility]  Dept: [phone]  and follow the prompts.  Office hours are 8:00 a.m. to 4:30 p.m. Monday - Friday. Please note that voicemails left after 4:00 p.m. may not be returned until the following business day.  We are closed weekends and major holidays. You have access to a nurse at all times for urgent questions. Please call the main number to the clinic Dept: [phone] and follow the prompts.   For any non-urgent questions, you may also contact your provider using MyChart. We now offer e-Visits for anyone 18 and older to request care online for non-urgent symptoms. For details visit [email or web address].   Also download the MyChart app! Go to the app store, search "MyChart", open the app, select [facility], and log in with your MyChart username and password.  Masks are optional in the cancer centers. If you would like for your care team to wear a mask while they are taking care of you, please let them know. You may have one support person who is at least 73 years old accompany you for your appointments. 

## 2021-09-30 NOTE — Progress Notes (Signed)
Per Dr. Alen Blew ok to treat with SCR of 2.44 mg/dL today.

## 2021-10-01 LAB — T4: T4, Total: 5.7 ug/dL (ref 4.5–12.0)

## 2021-10-20 NOTE — Progress Notes (Signed)
PCP:  [name], [name], MD Primary Cardiologist: None Electrophysiologist: Vickie Epley, MD   Bryce Cain is a 71 y.o. male seen today for Vickie Epley, MD for routine electrophysiology followup. Since last being seen in our clinic the patient reports doing well overall. He underwent nephrectomy and is to complete 1 year (17 cycles) of Keytruda.  he denies chest pain, palpitations, dyspnea, PND, orthopnea, nausea, vomiting, dizziness, syncope, edema, weight gain, or early satiety.   Past Medical History:  Diagnosis Date   CHF (congestive heart failure) (HCC)    DM (diabetes mellitus), type 2 (HCC)    OSA on CPAP    Past Surgical History:  Procedure Laterality Date   RIGHT/LEFT HEART CATH AND CORONARY ANGIOGRAPHY N/A 02/16/2021   Procedure: RIGHT/LEFT HEART CATH AND CORONARY ANGIOGRAPHY;  Surgeon: Jolaine Artist, MD;  Location: [facility];  Service: Cardiovascular;  Laterality: N/A;   ROBOT ASSISTED LAPAROSCOPIC NEPHRECTOMY Right 06/23/2021   Procedure: XI ROBOTIC ASSISTED LAPAROSCOPIC NEPHRECTOMY;  Surgeon: Ceasar Mons, MD;  Location: [facility];  Service: Urology;  Laterality: Right;    Current Outpatient Medications  Medication Sig Dispense Refill   acetaminophen (TYLENOL) 500 MG tablet Take 500 mg by mouth every 6 (six) hours as needed.     aspirin EC 81 MG tablet Take 1 tablet (81 mg total) by mouth daily. Swallow whole. 90 tablet 3   docusate sodium (COLACE) 100 MG capsule Take 100 mg by mouth daily as needed for mild constipation.     empagliflozin (JARDIANCE) 10 MG TABS tablet Take 1 tablet (10 mg total) by mouth daily. 90 tablet 3   isosorbide-hydrALAZINE (BIDIL) 20-37.5 MG tablet Take 1 tablet by mouth 3 (three) times daily. 90 tablet 6   Multiple Vitamin (MULTIVITAMIN) capsule Take 1 capsule by mouth daily.     Omega-3 Fatty Acids (FISH OIL) 1000 MG CAPS Take by mouth.     Pembrolizumab (KEYTRUDA IV) Inject 1 Dose into the vein  once a week. Every 3  weeks     potassium chloride (KLOR-CON M) 10 MEQ tablet Take 1 tablet (10 mEq total) by mouth daily. 30 tablet 3   PREVIDENT 5000 ENAMEL PROTECT 1.1-5 % GEL Place 1 application. onto teeth at bedtime.     Probiotic Product (UP4 PROBIOTICS ADULT PO) Take by mouth.     prochlorperazine (COMPAZINE) 10 MG tablet Take 1 tablet (10 mg total) by mouth every 6 (six) hours as needed for nausea or vomiting. 30 tablet 0   promethazine (PHENERGAN) 12.5 MG tablet Take 1 tablet (12.5 mg total) by mouth every 4 (four) hours as needed for nausea or vomiting. 15 tablet 0   rosuvastatin (CRESTOR) 40 MG tablet Take 1 tablet (40 mg total) by mouth daily. 90 tablet 3   torsemide (DEMADEX) 20 MG tablet Take 1 tablet (20 mg total) by mouth daily. 30 tablet 6   No current facility-administered medications for this visit.    No Known Allergies  Social History   Socioeconomic History   Marital status: Married    Spouse name: Not on file   Number of children: Not on file   Years of education: Not on file   Highest education level: Not on file  Occupational History   Not on file  Tobacco Use   Smoking status: Former    Types: Pipe    Quit date: 02/05/1991    Years since quitting: 30.7   Smokeless tobacco: Not on file  Substance and Sexual Activity   Alcohol use: Not on file   Drug use: Not on file   Sexual activity: Not on file  Other Topics Concern   Not on file  Social History Narrative   Not on file   Social Determinants of Health   Financial Resource Strain: Low Risk  (04/02/2021)   Overall Financial Resource Strain (CARDIA)    Difficulty of Paying Living Expenses: Not hard at all  Food Insecurity: No Food Insecurity (04/02/2021)   Hunger Vital Sign    Worried About Running Out of Food in the Last Year: Never true    Ran Out of Food in the Last Year: Never true  Transportation Needs: No Transportation Needs (04/02/2021)   PRAPARE - Hydrologist  (Medical): No    Lack of Transportation (Non-Medical): No  Physical Activity: Not on file  Stress: Not on file  Social Connections: Not on file  Intimate Partner Violence: Not on file     Review of Systems: All other systems reviewed and are otherwise negative except as noted above.  Physical Exam: Vitals:   10/27/21 1215  BP: 110/68  Pulse: 74  SpO2: 95%  Weight: 219 lb 12.8 oz (99.7 kg)  Height: '5\' 10"'$  (1.778 m)    GEN- The patient is well appearing, alert and oriented x 3 today.   HEENT: normocephalic, atraumatic; sclera clear, conjunctiva pink; hearing intact; oropharynx clear; neck supple, no JVP Lymph- no cervical lymphadenopathy Lungs- Clear to ausculation bilaterally, normal work of breathing.  No wheezes, rales, rhonchi Heart- Regular rate and rhythm, no murmurs, rubs or gallops, PMI not laterally displaced GI- soft, non-tender, non-distended, bowel sounds present, no hepatosplenomegaly Extremities- No peripheral edema. no clubbing or cyanosis; DP/PT/radial pulses 2+ bilaterally MS- no significant deformity or atrophy Skin- warm and dry, no rash or lesion Psych- euthymic mood, full affect Neuro- strength and sensation are intact  EKG is not ordered. Personal review of EKG from 07/19/2021 shows NSR at 94 bpm with LBBB > 170 ms  Additional studies reviewed include: Previous EP and HF office notes  Assessment and Plan:  1. Chronic systolic CHF 2. LBBB Explained risks, benefits, and alternatives to ICD implantation, including but not limited to bleeding, infection, pneumothorax, pericardial effusion, lead dislodgement, heart attack, stroke, or death.  Pt verbalized understanding and agrees to proceed.  3. RCC S/p nephrectomy 06/2021 Receiving Keytruda to complete 1 year in 17 cycles, started 08/19/2021  Follow up with Dr. Quentin Ore as usual post procedure.   Shirley Friar, PA-C  10/27/21 1:14 PM

## 2021-10-21 ENCOUNTER — Inpatient Hospital Stay (HOSPITAL_BASED_OUTPATIENT_CLINIC_OR_DEPARTMENT_OTHER): Payer: Medicare PPO | Admitting: Oncology

## 2021-10-21 ENCOUNTER — Inpatient Hospital Stay: Payer: Medicare PPO | Attending: Oncology

## 2021-10-21 ENCOUNTER — Inpatient Hospital Stay: Payer: Medicare PPO

## 2021-10-21 VITALS — BP 104/58 | HR 65 | Temp 98.4°F | Resp 18

## 2021-10-21 VITALS — BP 100/55 | HR 70 | Temp 98.0°F | Resp 18 | Ht 70.0 in | Wt 217.7 lb

## 2021-10-21 DIAGNOSIS — C641 Malignant neoplasm of right kidney, except renal pelvis: Secondary | ICD-10-CM

## 2021-10-21 DIAGNOSIS — I509 Heart failure, unspecified: Secondary | ICD-10-CM | POA: Insufficient documentation

## 2021-10-21 DIAGNOSIS — Z905 Acquired absence of kidney: Secondary | ICD-10-CM | POA: Diagnosis not present

## 2021-10-21 DIAGNOSIS — Z5112 Encounter for antineoplastic immunotherapy: Secondary | ICD-10-CM | POA: Diagnosis not present

## 2021-10-21 DIAGNOSIS — Z79899 Other long term (current) drug therapy: Secondary | ICD-10-CM | POA: Diagnosis not present

## 2021-10-21 LAB — CMP (CANCER CENTER ONLY)
ALT: 21 U/L (ref 0–44)
AST: 27 U/L (ref 15–41)
Albumin: 4.2 g/dL (ref 3.5–5.0)
Alkaline Phosphatase: 52 U/L (ref 38–126)
Anion gap: 8 (ref 5–15)
BUN: 59 mg/dL — ABNORMAL HIGH (ref 8–23)
CO2: 25 mmol/L (ref 22–32)
Calcium: 10.3 mg/dL (ref 8.9–10.3)
Chloride: 104 mmol/L (ref 98–111)
Creatinine: 2.83 mg/dL — ABNORMAL HIGH (ref 0.61–1.24)
GFR, Estimated: 23 mL/min — ABNORMAL LOW (ref >60)
Glucose, Bld: 140 mg/dL — ABNORMAL HIGH (ref 70–99)
Potassium: 4.1 mmol/L (ref 3.5–5.1)
Sodium: 137 mmol/L (ref 135–145)
Total Bilirubin: 0.7 mg/dL (ref 0.3–1.2)
Total Protein: 7.4 g/dL (ref 6.5–8.1)

## 2021-10-21 LAB — CBC WITH DIFFERENTIAL (CANCER CENTER ONLY)
Abs Immature Granulocytes: 0.01 10*3/uL (ref 0.00–0.07)
Basophils Absolute: 0.1 10*3/uL (ref 0.0–0.1)
Basophils Relative: 1 %
Eosinophils Absolute: 0.3 10*3/uL (ref 0.0–0.5)
Eosinophils Relative: 6 %
HCT: 37.2 % — ABNORMAL LOW (ref 39.0–52.0)
Hemoglobin: 13.4 g/dL (ref 13.0–17.0)
Immature Granulocytes: 0 %
Lymphocytes Relative: 32 %
Lymphs Abs: 1.4 10*3/uL (ref 0.7–4.0)
MCH: 27.9 pg (ref 26.0–34.0)
MCHC: 36 g/dL (ref 30.0–36.0)
MCV: 77.3 fL — ABNORMAL LOW (ref 80.0–100.0)
Monocytes Absolute: 0.5 10*3/uL (ref 0.1–1.0)
Monocytes Relative: 11 %
Neutro Abs: 2.2 10*3/uL (ref 1.7–7.7)
Neutrophils Relative %: 50 %
Platelet Count: 134 10*3/uL — ABNORMAL LOW (ref 150–400)
RBC: 4.81 MIL/uL (ref 4.22–5.81)
RDW: 15.1 % (ref 11.5–15.5)
WBC Count: 4.4 10*3/uL (ref 4.0–10.5)
nRBC: 0 % (ref 0.0–0.2)

## 2021-10-21 LAB — TSH: TSH: 16.231 u[IU]/mL — ABNORMAL HIGH (ref 0.350–4.500)

## 2021-10-21 MED ORDER — SODIUM CHLORIDE 0.9 % IV SOLN: Freq: Once | INTRAVENOUS | Status: AC

## 2021-10-21 MED ORDER — SODIUM CHLORIDE 0.9 % IV SOLN
200.0000 mg | Freq: Once | INTRAVENOUS | Status: AC
  Administered 2021-10-21: 200 mg via INTRAVENOUS
  Filled 2021-10-21: qty 200

## 2021-10-21 NOTE — Progress Notes (Signed)
Hematology and Oncology Follow Up Visit  Bryce Cain [id number] 1950-11-15 71 y.o. 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   Prior Therapy: Robotic assisted laparoscopic right radical nephrectomy on June 23, 2021.  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  Current therapy: Pembrolizumab 200 mg every 3 weeks started on August 19, 2021.  He is here for cycle 4 to complete 1 year in total 17 cycles.   Interim History: Bryce Cain presents today for a follow-up.  Since last visit, he reports no major changes in his health.  He has tolerated the last cycle of pembrolizumab without any complications.  He denies any skin rashes or lesions.  He denies any hospitalizations or illnesses.  He denies excessive fatigue, tiredness or weakness.     Medications: Reviewed without changes. Current Outpatient Medications  Medication Sig Dispense Refill   acetaminophen (TYLENOL) 500 MG tablet Take 500 mg by mouth every 6 (six) hours as needed.     aspirin EC 81 MG tablet Take 1 tablet (81 mg total) by mouth daily. Swallow whole. 90 tablet 3   docusate sodium (COLACE) 100 MG capsule Take 100 mg by mouth daily as needed for mild constipation.     empagliflozin (JARDIANCE) 10 MG TABS tablet Take 1 tablet (10 mg total) by mouth daily. 90 tablet 3   isosorbide-hydrALAZINE (BIDIL) 20-37.5 MG tablet Take 1 tablet by mouth 3 (three) times daily. 90 tablet 6   Multiple Vitamin (MULTIVITAMIN) capsule Take 1 capsule by mouth daily.     Omega-3 Fatty Acids (FISH OIL) 1000 MG CAPS Take by mouth.     Pembrolizumab (KEYTRUDA IV) Inject 1 Dose into the vein once a week. Every 3  weeks     potassium chloride (KLOR-CON M) 10 MEQ tablet Take 1 tablet (10 mEq total) by mouth daily. 30 tablet  3   PREVIDENT 5000 ENAMEL PROTECT 1.1-5 % GEL Place 1 application. onto teeth at bedtime.     Probiotic Product (UP4 PROBIOTICS ADULT PO) Take by mouth.     prochlorperazine (COMPAZINE) 10 MG tablet Take 1 tablet (10 mg total) by mouth every 6 (six) hours as needed for nausea or vomiting. 30 tablet 0   promethazine (PHENERGAN) 12.5 MG tablet Take 1 tablet (12.5 mg total) by mouth every 4 (four) hours as needed for nausea or vomiting. 15 tablet 0   rosuvastatin (CRESTOR) 40 MG tablet Take 1 tablet (40 mg total) by mouth daily. 90 tablet 3   torsemide (DEMADEX) 20 MG tablet Take 1 tablet (20 mg total) by mouth daily. 30 tablet 6   No current facility-administered medications for this visit.     Allergies: No Known Allergies    Physical Exam:  Blood pressure (!) 100/55, pulse 70, temperature 98 F (36.7 C), temperature source Temporal, resp. rate 18, height '5\' 10"'$  (1.778 m), weight 217 lb 11.2 oz (98.7 kg), SpO2 99 %.   ECOG: 1   General appearance: Comfortable appearing without any discomfort Head: Normocephalic without any trauma Oropharynx: Mucous membranes are moist and pink without any thrush or ulcers. Eyes: Pupils are equal and round reactive to light. Lymph nodes: No cervical, supraclavicular, inguinal or axillary lymphadenopathy.   Heart:regular rate and rhythm.  S1 and S2 without leg edema. Lung: Clear without any rhonchi  or wheezes.  No dullness to percussion. Abdomin: Soft, nontender, nondistended with good bowel sounds.  No hepatosplenomegaly. Musculoskeletal: No joint deformity or effusion.  Full range of motion noted. Neurological: No deficits noted on motor, sensory and deep tendon reflex exam. Skin: No petechial rash or dryness.  Appeared moist.       Lab Results: Lab Results  Component Value Date   WBC 4.6 09/30/2021   HGB 12.1 (L) 09/30/2021   HCT 34.2 (L) 09/30/2021   MCV 79.0 (L) 09/30/2021   PLT 161 09/30/2021     Chemistry      Component Value  Date/Time   NA 138 09/30/2021 1421   NA 138 07/10/2021 0000   K 4.3 09/30/2021 1421   CL 108 09/30/2021 1421   CO2 23 09/30/2021 1421   BUN 54 (H) 09/30/2021 1421   BUN 36 (A) 07/10/2021 0000   CREATININE 2.44 (H) 09/30/2021 1421   GLU 98 07/10/2021 0000      Component Value Date/Time   CALCIUM 9.8 09/30/2021 1421   ALKPHOS 42 09/30/2021 1421   AST 28 09/30/2021 1421   ALT 26 09/30/2021 1421   BILITOT 0.7 09/30/2021 1421          Impression and Plan:  71 year old man with with:  1.  T3a clear-cell renal cell diagnosed in June 2023.  He was found to have nuclear grade 4.  He continues to tolerate Pembrolizumab without any major complications at this time.  Risks and benefits of continuing this treatment were discussed.  Complications that include nausea, fatigue and autoimmune considerations were reiterated.  Staging CT scan options were discussed at this time and indication for it.  The does not appear to be scheduled under the care of Dr. Lovena Neighbours and we will set up a scan before the end of the year for surveillance purposes.     2.  IV access: Peripheral veins are currently in use without any issues.     3.  Autoimmune complications: I continue to educate him about these complications.  These include pneumonitis, colitis and thyroid disease.  4.  Congestive heart failure: No signs or symptoms of exacerbation noted at this time.   5.  Follow-up: In 3 weeks for follow-up evaluation.     30  minutes were spent on this encounter.  The time was dedicated to reviewing his laboratory data, disease status update and outlining future plan of care discussion.     Zola Button, MD 10/18/202312:39 PM

## 2021-10-21 NOTE — Progress Notes (Signed)
Okay to treat with creat 2.83 per Dr. Alen Blew

## 2021-10-27 ENCOUNTER — Encounter: Payer: Self-pay | Admitting: Student

## 2021-10-27 ENCOUNTER — Ambulatory Visit: Payer: Medicare PPO | Attending: Physician Assistant | Admitting: Student

## 2021-10-27 VITALS — BP 110/68 | HR 74 | Ht 70.0 in | Wt 219.8 lb

## 2021-10-27 DIAGNOSIS — I447 Left bundle-branch block, unspecified: Secondary | ICD-10-CM

## 2021-10-27 DIAGNOSIS — C641 Malignant neoplasm of right kidney, except renal pelvis: Secondary | ICD-10-CM

## 2021-10-27 DIAGNOSIS — I5022 Chronic systolic (congestive) heart failure: Secondary | ICD-10-CM | POA: Diagnosis not present

## 2021-10-27 NOTE — Patient Instructions (Signed)
Medication Instructions:  Your physician recommends that you continue on your current medications as directed. Please refer to the Current Medication list given to you today.  *If you need a refill on your cardiac medications before your next appointment, please call your pharmacy*   Lab Work: None If you have labs (blood work) drawn today and your tests are completely normal, you will receive your results only by: Mark (if you have MyChart) OR A paper copy in the mail If you have any lab test that is abnormal or we need to change your treatment, we will call you to review the results.   Follow-Up: At [facility], you and your health needs are our priority.  As part of our continuing mission to provide you with exceptional heart care, we have created designated Provider Care Teams.  These Care Teams include your primary Cardiologist (physician) and Advanced Practice Providers (APPs -  Physician Assistants and Nurse Practitioners) who all work together to provide you with the care you need, when you need it.   Your next appointment:   Our office will call you to schedule  Other Instructions See letter for procedure Instructions

## 2021-10-31 ENCOUNTER — Other Ambulatory Visit: Payer: Self-pay

## 2021-11-09 DIAGNOSIS — C641 Malignant neoplasm of right kidney, except renal pelvis: Secondary | ICD-10-CM | POA: Diagnosis not present

## 2021-11-11 ENCOUNTER — Inpatient Hospital Stay: Payer: Medicare PPO | Attending: Oncology

## 2021-11-11 ENCOUNTER — Inpatient Hospital Stay: Payer: Medicare PPO | Admitting: Oncology

## 2021-11-11 ENCOUNTER — Inpatient Hospital Stay: Payer: Medicare PPO

## 2021-11-11 ENCOUNTER — Other Ambulatory Visit: Payer: Self-pay

## 2021-11-11 VITALS — BP 116/67 | HR 68 | Temp 97.6°F | Resp 18

## 2021-11-11 VITALS — BP 108/66 | HR 69 | Temp 97.3°F | Resp 18 | Wt 223.8 lb

## 2021-11-11 DIAGNOSIS — Z905 Acquired absence of kidney: Secondary | ICD-10-CM | POA: Insufficient documentation

## 2021-11-11 DIAGNOSIS — C641 Malignant neoplasm of right kidney, except renal pelvis: Secondary | ICD-10-CM | POA: Insufficient documentation

## 2021-11-11 DIAGNOSIS — Z7982 Long term (current) use of aspirin: Secondary | ICD-10-CM | POA: Insufficient documentation

## 2021-11-11 DIAGNOSIS — Z7984 Long term (current) use of oral hypoglycemic drugs: Secondary | ICD-10-CM | POA: Diagnosis not present

## 2021-11-11 DIAGNOSIS — M549 Dorsalgia, unspecified: Secondary | ICD-10-CM | POA: Insufficient documentation

## 2021-11-11 DIAGNOSIS — I509 Heart failure, unspecified: Secondary | ICD-10-CM | POA: Diagnosis not present

## 2021-11-11 DIAGNOSIS — Z5112 Encounter for antineoplastic immunotherapy: Secondary | ICD-10-CM | POA: Diagnosis not present

## 2021-11-11 DIAGNOSIS — Z79899 Other long term (current) drug therapy: Secondary | ICD-10-CM | POA: Diagnosis not present

## 2021-11-11 LAB — CMP (CANCER CENTER ONLY)
ALT: 17 U/L (ref 0–44)
AST: 28 U/L (ref 15–41)
Albumin: 4.1 g/dL (ref 3.5–5.0)
Alkaline Phosphatase: 46 U/L (ref 38–126)
Anion gap: 6 (ref 5–15)
BUN: 59 mg/dL — ABNORMAL HIGH (ref 8–23)
CO2: 25 mmol/L (ref 22–32)
Calcium: 9.9 mg/dL (ref 8.9–10.3)
Chloride: 104 mmol/L (ref 98–111)
Creatinine: 2.73 mg/dL — ABNORMAL HIGH (ref 0.61–1.24)
GFR, Estimated: 24 mL/min — ABNORMAL LOW (ref >60)
Glucose, Bld: 94 mg/dL (ref 70–99)
Potassium: 4.7 mmol/L (ref 3.5–5.1)
Sodium: 135 mmol/L (ref 135–145)
Total Bilirubin: 0.6 mg/dL (ref 0.3–1.2)
Total Protein: 7.3 g/dL (ref 6.5–8.1)

## 2021-11-11 LAB — CBC WITH DIFFERENTIAL (CANCER CENTER ONLY)
Abs Immature Granulocytes: 0.01 10*3/uL (ref 0.00–0.07)
Basophils Absolute: 0 10*3/uL (ref 0.0–0.1)
Basophils Relative: 1 %
Eosinophils Absolute: 0.3 10*3/uL (ref 0.0–0.5)
Eosinophils Relative: 7 %
HCT: 35.6 % — ABNORMAL LOW (ref 39.0–52.0)
Hemoglobin: 13 g/dL (ref 13.0–17.0)
Immature Granulocytes: 0 %
Lymphocytes Relative: 24 %
Lymphs Abs: 0.9 10*3/uL (ref 0.7–4.0)
MCH: 28.1 pg (ref 26.0–34.0)
MCHC: 36.5 g/dL — ABNORMAL HIGH (ref 30.0–36.0)
MCV: 76.9 fL — ABNORMAL LOW (ref 80.0–100.0)
Monocytes Absolute: 0.7 10*3/uL (ref 0.1–1.0)
Monocytes Relative: 17 %
Neutro Abs: 2 10*3/uL (ref 1.7–7.7)
Neutrophils Relative %: 51 %
Platelet Count: 145 10*3/uL — ABNORMAL LOW (ref 150–400)
RBC: 4.63 MIL/uL (ref 4.22–5.81)
RDW: 15.9 % — ABNORMAL HIGH (ref 11.5–15.5)
WBC Count: 3.9 10*3/uL — ABNORMAL LOW (ref 4.0–10.5)
nRBC: 0 % (ref 0.0–0.2)

## 2021-11-11 LAB — TSH: TSH: 47.367 u[IU]/mL — ABNORMAL HIGH (ref 0.350–4.500)

## 2021-11-11 MED ORDER — SODIUM CHLORIDE 0.9 % IV SOLN: Freq: Once | INTRAVENOUS | Status: AC

## 2021-11-11 MED ORDER — SODIUM CHLORIDE 0.9 % IV SOLN
200.0000 mg | Freq: Once | INTRAVENOUS | Status: AC
  Administered 2021-11-11: 200 mg via INTRAVENOUS
  Filled 2021-11-11: qty 200

## 2021-11-11 NOTE — Patient Instructions (Signed)
[facility]  Discharge Instructions: Thank you for choosing [facility] to provide your oncology and hematology care.   If you have a lab appointment with the Cancer Center, please go directly to the Cancer Center and check in at the registration area.   Wear comfortable clothing and clothing appropriate for easy access to any Portacath or PICC line.   We strive to give you quality time with your provider. You may need to reschedule your appointment if you arrive late (15 or more minutes).  Arriving late affects you and other patients whose appointments are after yours.  Also, if you miss three or more appointments without notifying the office, you may be dismissed from the clinic at the provider's discretion.      For prescription refill requests, have your pharmacy contact our office and allow 72 hours for refills to be completed.    Today you received the following chemotherapy and/or immunotherapy agents: Keytruda.       To help prevent nausea and vomiting after your treatment, we encourage you to take your nausea medication as directed.  BELOW ARE SYMPTOMS THAT SHOULD BE REPORTED IMMEDIATELY: *FEVER GREATER THAN 100.4 F (38 C) OR HIGHER *CHILLS OR SWEATING *NAUSEA AND VOMITING THAT IS NOT CONTROLLED WITH YOUR NAUSEA MEDICATION *UNUSUAL SHORTNESS OF BREATH *UNUSUAL BRUISING OR BLEEDING *URINARY PROBLEMS (pain or burning when urinating, or frequent urination) *BOWEL PROBLEMS (unusual diarrhea, constipation, pain near the anus) TENDERNESS IN MOUTH AND THROAT WITH OR WITHOUT PRESENCE OF ULCERS (sore throat, sores in mouth, or a toothache) UNUSUAL RASH, SWELLING OR PAIN  UNUSUAL VAGINAL DISCHARGE OR ITCHING   Items with * indicate a potential emergency and should be followed up as soon as possible or go to the Emergency Department if any problems should occur.  Please show the CHEMOTHERAPY ALERT CARD or IMMUNOTHERAPY ALERT CARD at check-in to  the Emergency Department and triage nurse.  Should you have questions after your visit or need to cancel or reschedule your appointment, please contact [facility]  Dept: [phone]  and follow the prompts.  Office hours are 8:00 a.m. to 4:30 p.m. Monday - Friday. Please note that voicemails left after 4:00 p.m. may not be returned until the following business day.  We are closed weekends and major holidays. You have access to a nurse at all times for urgent questions. Please call the main number to the clinic Dept: [phone] and follow the prompts.   For any non-urgent questions, you may also contact your provider using MyChart. We now offer e-Visits for anyone 18 and older to request care online for non-urgent symptoms. For details visit [email or web address].   Also download the MyChart app! Go to the app store, search "MyChart", open the app, select [facility], and log in with your MyChart username and password.  Masks are optional in the cancer centers. If you would like for your care team to wear a mask while they are taking care of you, please let them know. You may have one support person who is at least 73 years old accompany you for your appointments. 

## 2021-11-11 NOTE — Progress Notes (Signed)
Per Dr Alen Blew, treat regardless of creat results

## 2021-11-11 NOTE — Progress Notes (Signed)
Hematology and Oncology Follow Up Visit  Bryce Cain [id number] [birth year] 71 y.o. 11/11/2021 12:45 PM Erline Hau, MDShadad, Mathis Dad, MD   Principle Diagnosis: 40 year old man with kidney cancer diagnosed in June 2023 he was found to have T3a clear-cell right kidney tumor with nuclear grade 4    Prior Therapy: Robotic assisted laparoscopic right radical nephrectomy on June 23, 2021.  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  Current therapy: Pembrolizumab 200 mg every 3 weeks started on August 19, 2021.  He is here for cycle 5 to complete one year in total 17 cycles.   Interim History: Bryce Cain is here for repeat evaluation.  Since last visit, he reports feeling well without any major complaints.  He denies any nausea, vomiting or abdominal pain.  He denies skin rashes or lesions.  He does report some occasional back pain discomfort.  Overall he has tolerated Pembrolizumab without any complaints.    Medications: Updated on review. Current Outpatient Medications  Medication Sig Dispense Refill   acetaminophen (TYLENOL) 500 MG tablet Take 500 mg by mouth every 6 (six) hours as needed.     aspirin EC 81 MG tablet Take 1 tablet (81 mg total) by mouth daily. Swallow whole. 90 tablet 3   docusate sodium (COLACE) 100 MG capsule Take 100 mg by mouth daily as needed for mild constipation.     empagliflozin (JARDIANCE) 10 MG TABS tablet Take 1 tablet (10 mg total) by mouth daily. 90 tablet 3   isosorbide-hydrALAZINE (BIDIL) 20-37.5 MG tablet Take 1 tablet by mouth 3 (three) times daily. 90 tablet 6   Multiple Vitamin (MULTIVITAMIN) capsule Take 1 capsule by mouth daily.     Omega-3 Fatty Acids (FISH OIL) 1000 MG CAPS Take by mouth.     Pembrolizumab (KEYTRUDA IV) Inject 1 Dose into the vein once a week. Every 3  weeks     potassium chloride (KLOR-CON M) 10 MEQ tablet Take 1 tablet (10 mEq total)  by mouth daily. 30 tablet 3   PREVIDENT 5000 ENAMEL PROTECT 1.1-5 % GEL Place 1 application. onto teeth at bedtime.     Probiotic Product (UP4 PROBIOTICS ADULT PO) Take by mouth.     prochlorperazine (COMPAZINE) 10 MG tablet Take 1 tablet (10 mg total) by mouth every 6 (six) hours as needed for nausea or vomiting. 30 tablet 0   promethazine (PHENERGAN) 12.5 MG tablet Take 1 tablet (12.5 mg total) by mouth every 4 (four) hours as needed for nausea or vomiting. 15 tablet 0   rosuvastatin (CRESTOR) 40 MG tablet Take 1 tablet (40 mg total) by mouth daily. 90 tablet 3   torsemide (DEMADEX) 20 MG tablet Take 1 tablet (20 mg total) by mouth daily. 30 tablet 6   No current facility-administered medications for this visit.     Allergies: No Known Allergies    Physical Exam:   Blood pressure 108/66, pulse 69, temperature (!) 97.3 F (36.3 C), temperature source Temporal, resp. rate 18, weight 223 lb 12.8 oz (101.5 kg), SpO2 95 %.   ECOG: 1   General appearance: Alert, awake without any distress. Head: Atraumatic without abnormalities Oropharynx: Without any thrush or ulcers. Eyes: No scleral icterus. Lymph nodes: No lymphadenopathy noted in the cervical, supraclavicular, or axillary nodes Heart:regular rate and rhythm, without any murmurs or gallops.   Lung: Clear to auscultation without any rhonchi, wheezes or dullness to percussion. Abdomin: Soft,  nontender without any shifting dullness or ascites. Musculoskeletal: No clubbing or cyanosis. Neurological: No motor or sensory deficits. Skin: No rashes or lesions.       Lab Results: Lab Results  Component Value Date   WBC 4.4 10/21/2021   HGB 13.4 10/21/2021   HCT 37.2 (L) 10/21/2021   MCV 77.3 (L) 10/21/2021   PLT 134 (L) 10/21/2021     Chemistry      Component Value Date/Time   NA 137 10/21/2021 1234   NA 138 07/10/2021 0000   K 4.1 10/21/2021 1234   CL 104 10/21/2021 1234   CO2 25 10/21/2021 1234   BUN 59 (H)  10/21/2021 1234   BUN 36 (A) 07/10/2021 0000   CREATININE 2.83 (H) 10/21/2021 1234   GLU 98 07/10/2021 0000      Component Value Date/Time   CALCIUM 10.3 10/21/2021 1234   ALKPHOS 52 10/21/2021 1234   AST 27 10/21/2021 1234   ALT 21 10/21/2021 1234   BILITOT 0.7 10/21/2021 1234          Impression and Plan:  71 year old man with with:  1.  Kidney cancer diagnosed in June 2023.  He was found to have T3a clear-cell involving the right kidney.    Risks and benefits of continuing adjuvant Pembrolizumab were discussed at this time.  The overall survival benefit has been proven with adjuvant therapy and I recommended continuing the same dose and schedule.  Complications that include immune mediated concerns, GI toxicity and dermatological issues were reviewed.  CT scan obtained on November 09, 2021 was personally reviewed and showed no evidence of metastatic disease.  He will have that repeated in 6 months under the care of Dr. Lovena Neighbours.     2.  IV access: No complications noted to using peripheral veins.     3.  Autoimmune complications: He has not experienced any complication clued pneumonitis, colitis and thyroid disease.  4.  Congestive heart failure: He is scheduled to have ICD insertion on November 30.  We will adjust the next infusion accordingly.   5.  Follow-up: In 4 weeks for the next Pembrolizumab infusion due to his cardiac procedure.     30  minutes were dedicated to this visit.  The time was spent on updating disease status, treatment choices and outlining future plan of care review.     Zola Button, MD 11/8/202312:45 PM

## 2021-11-16 DIAGNOSIS — C641 Malignant neoplasm of right kidney, except renal pelvis: Secondary | ICD-10-CM | POA: Diagnosis not present

## 2021-11-21 ENCOUNTER — Other Ambulatory Visit (HOSPITAL_COMMUNITY): Payer: Self-pay | Admitting: Family Medicine

## 2021-11-23 ENCOUNTER — Ambulatory Visit: Payer: Medicare PPO | Attending: Student

## 2021-11-23 DIAGNOSIS — I447 Left bundle-branch block, unspecified: Secondary | ICD-10-CM

## 2021-11-23 DIAGNOSIS — I5022 Chronic systolic (congestive) heart failure: Secondary | ICD-10-CM

## 2021-11-23 DIAGNOSIS — C641 Malignant neoplasm of right kidney, except renal pelvis: Secondary | ICD-10-CM

## 2021-11-24 ENCOUNTER — Encounter: Payer: Self-pay | Admitting: Oncology

## 2021-11-24 ENCOUNTER — Telehealth: Payer: Self-pay | Admitting: Oncology

## 2021-11-24 LAB — BASIC METABOLIC PANEL
BUN/Creatinine Ratio: 18 (ref 10–24)
BUN: 48 mg/dL — ABNORMAL HIGH (ref 8–27)
CO2: 23 mmol/L (ref 20–29)
Calcium: 10.2 mg/dL (ref 8.6–10.2)
Chloride: 100 mmol/L (ref 96–106)
Creatinine, Ser: 2.68 mg/dL — ABNORMAL HIGH (ref 0.76–1.27)
Glucose: 102 mg/dL — ABNORMAL HIGH (ref 70–99)
Potassium: 4.5 mmol/L (ref 3.5–5.2)
Sodium: 137 mmol/L (ref 134–144)
eGFR: 24 mL/min/{1.73_m2} — ABNORMAL LOW (ref >59)

## 2021-11-24 LAB — CBC
Hematocrit: 36.6 % — ABNORMAL LOW (ref 37.5–51.0)
Hemoglobin: 12.5 g/dL — ABNORMAL LOW (ref 13.0–17.7)
MCH: 27.7 pg (ref 26.6–33.0)
MCHC: 34.2 g/dL (ref 31.5–35.7)
MCV: 81 fL (ref 79–97)
Platelets: 133 10*3/uL — ABNORMAL LOW (ref 150–450)
RBC: 4.52 x10E6/uL (ref 4.14–5.80)
RDW: 16.3 % — ABNORMAL HIGH (ref 11.6–15.4)
WBC: 5.4 10*3/uL (ref 3.4–10.8)

## 2021-11-24 NOTE — Telephone Encounter (Signed)
Called patient regarding December appointments, patient is notified,

## 2021-12-02 ENCOUNTER — Ambulatory Visit: Payer: Medicare PPO | Admitting: Oncology

## 2021-12-02 ENCOUNTER — Ambulatory Visit: Payer: Medicare PPO

## 2021-12-02 ENCOUNTER — Other Ambulatory Visit: Payer: Medicare PPO

## 2021-12-02 NOTE — Pre-Procedure Instructions (Signed)
Attempted to call patient regarding procedure instructions.  Left voice mail on the following items: IArrival time 1130 Nothing to eat or drink after midnight No meds AM of procedure Responsible person to drive you home and stay with you for 24 hrs Wash with special soap night before and morning of procedure

## 2021-12-03 ENCOUNTER — Other Ambulatory Visit: Payer: Self-pay

## 2021-12-03 ENCOUNTER — Ambulatory Visit (HOSPITAL_COMMUNITY)
Admission: RE | Disposition: A | Discharge status: Home / Self Care | Payer: Self-pay | Source: Home / Self Care | Attending: Cardiology

## 2021-12-03 ENCOUNTER — Ambulatory Visit (HOSPITAL_COMMUNITY)
Admission: RE | Admit: 2021-12-03 | Discharge: 2021-12-04 | Disposition: A | Discharge status: Home / Self Care | Payer: Medicare PPO | Attending: Cardiology | Admitting: Cardiology

## 2021-12-03 DIAGNOSIS — I110 Hypertensive heart disease with heart failure: Secondary | ICD-10-CM | POA: Insufficient documentation

## 2021-12-03 DIAGNOSIS — I428 Other cardiomyopathies: Secondary | ICD-10-CM | POA: Diagnosis not present

## 2021-12-03 DIAGNOSIS — Z95810 Presence of automatic (implantable) cardiac defibrillator: Secondary | ICD-10-CM | POA: Diagnosis present

## 2021-12-03 DIAGNOSIS — I5022 Chronic systolic (congestive) heart failure: Secondary | ICD-10-CM | POA: Insufficient documentation

## 2021-12-03 DIAGNOSIS — Z87891 Personal history of nicotine dependence: Secondary | ICD-10-CM | POA: Diagnosis not present

## 2021-12-03 DIAGNOSIS — G4733 Obstructive sleep apnea (adult) (pediatric): Secondary | ICD-10-CM | POA: Insufficient documentation

## 2021-12-03 DIAGNOSIS — Z7984 Long term (current) use of oral hypoglycemic drugs: Secondary | ICD-10-CM | POA: Insufficient documentation

## 2021-12-03 DIAGNOSIS — E119 Type 2 diabetes mellitus without complications: Secondary | ICD-10-CM | POA: Insufficient documentation

## 2021-12-03 DIAGNOSIS — I447 Left bundle-branch block, unspecified: Secondary | ICD-10-CM | POA: Diagnosis not present

## 2021-12-03 DIAGNOSIS — I2510 Atherosclerotic heart disease of native coronary artery without angina pectoris: Secondary | ICD-10-CM | POA: Diagnosis not present

## 2021-12-03 DIAGNOSIS — I429 Cardiomyopathy, unspecified: Secondary | ICD-10-CM | POA: Diagnosis not present

## 2021-12-03 HISTORY — PX: BIV ICD INSERTION CRT-D: EP1195

## 2021-12-03 LAB — GLUCOSE, CAPILLARY
Glucose-Capillary: 97 mg/dL (ref 70–99)
Glucose-Capillary: 83 mg/dL (ref 70–99)

## 2021-12-03 SURGERY — BIV ICD INSERTION CRT-D

## 2021-12-03 MED ORDER — LIDOCAINE HCL (PF) 1 % IJ SOLN
INTRAMUSCULAR | Status: AC
  Filled 2021-12-03: qty 30

## 2021-12-03 MED ORDER — ONDANSETRON HCL 4 MG/2ML IJ SOLN: 4.0000 mg | Freq: Four times a day (QID) | INTRAMUSCULAR | Status: DC | PRN

## 2021-12-03 MED ORDER — LIDOCAINE HCL 1 % IJ SOLN
INTRAMUSCULAR | Status: AC
  Filled 2021-12-03: qty 20

## 2021-12-03 MED ORDER — ORAL CARE MOUTH RINSE: 15.0000 mL | OROMUCOSAL | Status: DC | PRN

## 2021-12-03 MED ORDER — SODIUM CHLORIDE 0.9 % IV SOLN
80.0000 mg | INTRAVENOUS | Status: AC
  Administered 2021-12-03: 80 mg

## 2021-12-03 MED ORDER — MIDAZOLAM HCL 5 MG/5ML IJ SOLN
INTRAMUSCULAR | Status: AC
  Filled 2021-12-03: qty 5

## 2021-12-03 MED ORDER — EMPAGLIFLOZIN 10 MG PO TABS
10.0000 mg | ORAL_TABLET | Freq: Every day | ORAL | Status: DC
  Filled 2021-12-03: qty 1

## 2021-12-03 MED ORDER — SODIUM CHLORIDE 0.9 % IV SOLN: INTRAVENOUS | Status: DC

## 2021-12-03 MED ORDER — CHLORHEXIDINE GLUCONATE 4 % EX LIQD
4.0000 | Freq: Once | CUTANEOUS | Status: AC
  Administered 2021-12-03: 4 via TOPICAL
  Filled 2021-12-03: qty 60

## 2021-12-03 MED ORDER — CEFAZOLIN SODIUM-DEXTROSE 2-4 GM/100ML-% IV SOLN
INTRAVENOUS | Status: AC
  Filled 2021-12-03: qty 100

## 2021-12-03 MED ORDER — LIDOCAINE HCL (PF) 1 % IJ SOLN
INTRAMUSCULAR | Status: DC | PRN
  Administered 2021-12-03: 60 mL

## 2021-12-03 MED ORDER — MIDAZOLAM HCL 5 MG/5ML IJ SOLN
INTRAMUSCULAR | Status: DC | PRN
  Administered 2021-12-03 (×3): .5 mg via INTRAVENOUS

## 2021-12-03 MED ORDER — ACETAMINOPHEN 325 MG PO TABS
325.0000 mg | ORAL_TABLET | ORAL | Status: DC | PRN
  Administered 2021-12-03 – 2021-12-04 (×4): 650 mg via ORAL
  Filled 2021-12-03 (×3): qty 2
  Filled 2021-12-03 (×2): qty 1

## 2021-12-03 MED ORDER — HEPARIN (PORCINE) IN NACL 1000-0.9 UT/500ML-% IV SOLN
INTRAVENOUS | Status: DC | PRN
  Administered 2021-12-03: 500 mL

## 2021-12-03 MED ORDER — SODIUM CHLORIDE 0.9 % IV SOLN
INTRAVENOUS | Status: AC
  Filled 2021-12-03: qty 2

## 2021-12-03 MED ORDER — FENTANYL CITRATE (PF) 100 MCG/2ML IJ SOLN
INTRAMUSCULAR | Status: AC
  Filled 2021-12-03: qty 2

## 2021-12-03 MED ORDER — CEFAZOLIN SODIUM-DEXTROSE 2-4 GM/100ML-% IV SOLN
2.0000 g | INTRAVENOUS | Status: AC
  Administered 2021-12-03: 2 g via INTRAVENOUS

## 2021-12-03 MED ORDER — FENTANYL CITRATE (PF) 100 MCG/2ML IJ SOLN
INTRAMUSCULAR | Status: DC | PRN
  Administered 2021-12-03 (×3): 12.5 ug via INTRAVENOUS

## 2021-12-03 MED ORDER — ROSUVASTATIN CALCIUM 20 MG PO TABS
40.0000 mg | ORAL_TABLET | Freq: Every day | ORAL | Status: DC
  Filled 2021-12-03: qty 2

## 2021-12-03 SURGICAL SUPPLY — 20 items
CABLE SURGICAL S-101-97-12 (CABLE) ×1 IMPLANT
CATH CPS DIRECT 135 DS2C020 (CATHETERS) IMPLANT
ICD GALLANT HFCRTD CDHFA500Q (ICD Generator) IMPLANT
KIT MICROPUNCTURE NIT STIFF (SHEATH) IMPLANT
LEAD DURATA 7122Q-65CM (Lead) IMPLANT
LEAD QUARTET 1458Q-86CM (Lead) IMPLANT
LEAD QUARTET 1458QL-86 (Lead) IMPLANT
LEAD ULTIPACE 58 LPA1231/58 (Lead) IMPLANT
PAD DEFIB RADIO PHYSIO CONN (PAD) ×1 IMPLANT
POUCH AIGIS-R ANTIBACT ICD (Mesh General) ×1 IMPLANT
POUCH AIGIS-R ANTIBACT ICD LRG (Mesh General) IMPLANT
QUARTET 1458QL-86 (Lead) ×1 IMPLANT
SHEATH 7FR PRELUDE SNAP 13 (SHEATH) IMPLANT
SHEATH 9.5FR PRELUDE SNAP 13 (SHEATH) IMPLANT
SHEATH PROBE COVER 6X72 (BAG) IMPLANT
SLITTER AGILIS HISPRO (INSTRUMENTS) IMPLANT
TRAY PACEMAKER INSERTION (PACKS) ×1 IMPLANT
WIRE ACUITY WHISPER EDS 4648 (WIRE) IMPLANT
WIRE HI TORQ VERSACORE-J 145CM (WIRE) IMPLANT
WIRE MAILMAN 182CM (WIRE) IMPLANT

## 2021-12-03 NOTE — Progress Notes (Signed)
Patient is very determined to do everything independently---I have reminded him not to use his left arm/sling for the next 24 hours, would prefer he call for assistance to stand when using urinal--he insists he needs no help and he will call if he changes his mind---I have reminded him again NOT to use left arm/sling for the next 24 hours

## 2021-12-03 NOTE — Plan of Care (Signed)
  Problem: Clinical Measurements: Goal: Respiratory complications will improve Outcome: Progressing Goal: Cardiovascular complication will be avoided Outcome: Progressing   Problem: Activity: Goal: Risk for activity intolerance will decrease Outcome: Progressing   Problem: Elimination: Goal: Will not experience complications related to urinary retention Outcome: Progressing   Problem: Pain Managment: Goal: General experience of comfort will improve Outcome: Progressing

## 2021-12-03 NOTE — H&P (Signed)
Electrophysiology Office Note:     Date:  12/03/2021    ID:  Bryce Cain, Bryce Cain Dec 10, 1950, MRN [id number]   PCP:  Teena Dunk, NP     [facility] Cardiologist:  None  [facility] Electrophysiologist:  None    Referring MD: Associates, Lady Gary *    Chief Complaint: CSHF   History of Present Illness:     Bryce Cain is a 72 y.o. male who presents for post hospital follow-up and to revisit consideration of CRT-D at the request of [facility]. Their medical history includes chronic systolic CHF, CAD, hypertension, type 2 diabetes mellitus, and OSA on CPAP.   He was admitted to the hospital in 02/2021 with new acute systolic CHF. Cardiac MRI revealed LVEF 16% and RVEF 52His cardiomyopathy was felt to be likely due to LBBB. Planned for CRT-D if his EF remained persistently reduced. Then he was readmitted 03/20/2021 with acute on chronic systolic CHF. Echo 03/21/21 showed EF 20%. EP evaluated for consideration of CRT-D due to recurrent admission for decompensated HF, recommended optimizing medical therapy and f/u as outpatient to discuss device.    He followed up with Dr. Haroldine Laws 04/02/2021. It was noted that after being seen in Urology Clinic it was felt he needed relatively urgent right nephrectomy for large right renal cell CA. They discussed his very high risk for peri-operative cardiac complications and possibly death. It was recommended to wait 2 weeks to ensure clinical stability and then admit pre-op for cardiac optimization prior to proceeding with surgery.   Overall, he has been feeling fine. However, he did suffer from severe intestinal flu about 2.5-3 weeks ago. At this time he has recovered well. His appetite has returned to normal.   He confirms that he is scheduled to be admitted 6/8 with his nephrectomy to be performed the following day.   He denies any palpitations, chest pain, shortness of breath, or peripheral edema. No lightheadedness,  headaches, syncope, orthopnea, or PND.   Presents for CRT-D implant.     Objective      Past Medical History:  Diagnosis Date   DM (diabetes mellitus), type 2 ([place])     OSA on CPAP             Past Surgical History:  Procedure Laterality Date   RIGHT/LEFT HEART CATH AND CORONARY ANGIOGRAPHY N/A 02/16/2021    Procedure: RIGHT/LEFT HEART CATH AND CORONARY ANGIOGRAPHY;  Surgeon: Jolaine Artist, MD;  Location: [facility];  Service: Cardiovascular;  Laterality: N/A;      Current Medications: Active Medications      Current Meds  Medication Sig   acidophilus (RISAQUAD) CAPS capsule Take 1 capsule by mouth daily.   aspirin EC 81 MG tablet Take 81 mg by mouth daily. Swallow whole.   digoxin (LANOXIN) 0.125 MG tablet Take 1 tablet (0.125 mg total) by mouth daily.   docusate sodium (COLACE) 100 MG capsule Take 100 mg by mouth as needed for mild constipation.   empagliflozin (JARDIANCE) 10 MG TABS tablet Take 1 tablet (10 mg total) by mouth daily.   Multiple Vitamins-Minerals (MULTIVITAMIN WITH MINERALS) tablet Take 1 tablet by mouth daily.   Omega-3 Fatty Acids (FISH OIL) 1000 MG CAPS Take 1,000 mg by mouth daily.   potassium chloride (KLOR-CON M) 10 MEQ tablet Take 1 tablet (10 mEq total) by mouth daily.   PREVIDENT 5000 ENAMEL PROTECT 1.1-5 % GEL Place 1 application. onto teeth at bedtime.   rosuvastatin (CRESTOR)  40 MG tablet Take 1 tablet (40 mg total) by mouth daily.   sacubitril-valsartan (ENTRESTO) 24-26 MG Take 1 tablet by mouth 2 (two) times daily.   spironolactone (ALDACTONE) 25 MG tablet Take 1 tablet (25 mg total) by mouth daily.   torsemide (DEMADEX) 20 MG tablet Take 1 tablet (20 mg total) by mouth daily.        Allergies:   Patient has no known allergies.    Social History         Socioeconomic History   Marital status: Married      Spouse name: Not on file   Number of children: Not on file   Years of education: Not on file   Highest education  level: Not on file  Occupational History   Not on file  Tobacco Use   Smoking status: Former      Types: Pipe      Quit date: 02/05/1991      Years since quitting: 30.3   Smokeless tobacco: Not on file  Substance and Sexual Activity   Alcohol use: Not on file   Drug use: Not on file   Sexual activity: Not on file  Other Topics Concern   Not on file  Social History Narrative   Not on file    Social Determinants of Health       Financial Resource Strain: Low Risk    Difficulty of Paying Living Expenses: Not hard at all  Food Insecurity: No Food Insecurity   Worried About [place] in the Last Year: Never true   [place] in the Last Year: Never true  Transportation Needs: No Transportation Needs   Lack of Transportation (Medical): No   Lack of Transportation (Non-Medical): No  Physical Activity: Not on file  Stress: Not on file  Social Connections: Not on file      Family History: The patient's family history includes Heart attack in his father.   ROS:   Please see the history of present illness.      All other systems reviewed and are negative.   EKGs/Labs/Other Studies Reviewed:     The following studies were reviewed today:         EKG:   EKG is personally reviewed.  05/19/2021: EKG was not ordered. 03/23/2021 (ED): Sinus tachycardia, LBBB.   Recent Labs: 02/14/2021: TSH 0.662 03/02/2021: Magnesium 2.1 04/02/2021: B Natriuretic Peptide 415.6; BUN 38; Creatinine, Ser 1.75; Hemoglobin 13.2; Platelets 174; Potassium 3.9; Sodium 136    Recent Lipid Panel Labs (Brief)          Component Value Date/Time    CHOL 137 02/15/2021 0333    TRIG 41 02/15/2021 0333    HDL 55 02/15/2021 0333    CHOLHDL 2.5 02/15/2021 0333    VLDL 8 02/15/2021 0333    LDLCALC 74 02/15/2021 0333        Physical Exam:     VS:  BP (!) 116/69   Pulse 69   Ht '5\' 10"'$  (1.778 m)   Wt 216 lb (98 kg)   SpO2 96%   BMI 30.99 kg/m         Wt Readings from Last 3  Encounters:  05/19/21 216 lb (98 kg)  04/07/21 210 lb (95.3 kg)  04/02/21 215 lb (97.5 kg)      GEN: Well nourished, well developed in no acute distress HEENT: Normal NECK: No JVD; No carotid bruits LYMPHATICS: No lymphadenopathy CARDIAC: RRR, no murmurs, rubs, gallops  RESPIRATORY:  Clear to auscultation without rales, wheezing or rhonchi  ABDOMEN: Soft, non-tender, non-distended MUSCULOSKELETAL:  No edema; No deformity  SKIN: Warm and dry NEUROLOGIC:  Alert and oriented x 3 PSYCHIATRIC:  Normal affect          Assessment ASSESSMENT:     1. Chronic systolic heart failure ([place])   2. LBBB (left bundle branch block)     PLAN:     In order of problems listed above:   #Chronic systolic heart failure Severely reduced EF.  NYHA class III symptoms.   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  He is referred by Dr Jeffie Pollock for risk stratification of sudden death and consideration of ICD implantation.  At this time, he meets criteria for ICD implantation for primary prevention of sudden death.  I have had a thorough discussion with the patient reviewing options.  The patient and their family (if available) have had opportunities to ask questions and have them answered. The patient and I have decided together through a shared decision making process to proceed with ICD implant at this time.    Risks, benefits, alternatives to ICD implantation were discussed in detail with the patient today. The patient understands that the risks include but are not limited to bleeding, infection, pneumothorax, perforation, tamponade, vascular damage, renal failure, MI, stroke, death, inappropriate shocks, and lead dislodgement and wishes to proceed.     Signed, Hilton Cork. Quentin Ore, MD, [facility], [facility] 12/03/2021 Electrophysiology [facility]

## 2021-12-04 ENCOUNTER — Encounter (HOSPITAL_COMMUNITY): Payer: Self-pay | Admitting: Cardiology

## 2021-12-04 ENCOUNTER — Ambulatory Visit (HOSPITAL_COMMUNITY): Payer: Medicare PPO

## 2021-12-04 DIAGNOSIS — I700 Atherosclerosis of aorta: Secondary | ICD-10-CM | POA: Diagnosis not present

## 2021-12-04 DIAGNOSIS — E119 Type 2 diabetes mellitus without complications: Secondary | ICD-10-CM | POA: Diagnosis not present

## 2021-12-04 DIAGNOSIS — Z7984 Long term (current) use of oral hypoglycemic drugs: Secondary | ICD-10-CM | POA: Diagnosis not present

## 2021-12-04 DIAGNOSIS — I428 Other cardiomyopathies: Secondary | ICD-10-CM | POA: Diagnosis not present

## 2021-12-04 DIAGNOSIS — I110 Hypertensive heart disease with heart failure: Secondary | ICD-10-CM | POA: Diagnosis not present

## 2021-12-04 DIAGNOSIS — G4733 Obstructive sleep apnea (adult) (pediatric): Secondary | ICD-10-CM | POA: Diagnosis not present

## 2021-12-04 DIAGNOSIS — I77810 Thoracic aortic ectasia: Secondary | ICD-10-CM | POA: Diagnosis not present

## 2021-12-04 DIAGNOSIS — Z87891 Personal history of nicotine dependence: Secondary | ICD-10-CM | POA: Diagnosis not present

## 2021-12-04 DIAGNOSIS — Z95810 Presence of automatic (implantable) cardiac defibrillator: Secondary | ICD-10-CM

## 2021-12-04 DIAGNOSIS — I5022 Chronic systolic (congestive) heart failure: Secondary | ICD-10-CM | POA: Diagnosis not present

## 2021-12-04 DIAGNOSIS — I2510 Atherosclerotic heart disease of native coronary artery without angina pectoris: Secondary | ICD-10-CM | POA: Diagnosis not present

## 2021-12-04 DIAGNOSIS — I517 Cardiomegaly: Secondary | ICD-10-CM | POA: Diagnosis not present

## 2021-12-04 DIAGNOSIS — J9811 Atelectasis: Secondary | ICD-10-CM | POA: Diagnosis not present

## 2021-12-04 DIAGNOSIS — I447 Left bundle-branch block, unspecified: Secondary | ICD-10-CM | POA: Diagnosis not present

## 2021-12-04 MED ORDER — ACETAMINOPHEN 325 MG PO TABS: 325.0000 mg | ORAL_TABLET | ORAL | Status: AC | PRN

## 2021-12-04 NOTE — Discharge Summary (Signed)
ELECTROPHYSIOLOGY PROCEDURE DISCHARGE SUMMARY    Patient ID: Bryce Cain,  MRN: [id number], DOB/AGE: 1950/04/24 71 y.o.  Admit date: 12/03/2021 Discharge date: 12/04/2021  Primary Care Physician: [name], [name], MD  Primary Cardiologist: None  Electrophysiologist: Dr. Quentin Ore    Primary Diagnosis:  Chronic systolic CHF   Secondary Diagnosis: LBBB  No Known Allergies   Procedures This Admission:  1.  Implantation of a Abbott BiV ICD on 12/03/2021 by Dr. Quentin Ore.  The patient received a Abbot Gallant CDVHRA500Q (BiV)  with Abbott Ultipace 1231-58 right atrial lead and Abbott Durata 7122 right ventricular lead. Pt received a Abbot Quartet 1458Q LV lead.  There were no post procedure complications 2.  CXR on 12/04/21 demonstrated no pneumothorax status post device implantation.      Brief HPI: Bryce Cain is a 71 y.o. male was referred to electrophysiology in the outpatient setting  for consideration of ICD implantation.  Past medical history includes above.  The patient has persistent LV dysfunction despite guideline directed therapy.  Risks, benefits, and alternatives to ICD implantation were reviewed with the patient who wished to proceed.   Hospital Course:  The patient was admitted and underwent implantation of a Abbot/St. Jude BiV ICD with details as outlined above. They were monitored on telemetry overnight which demonstrated appropriate pacing .  Left chest was without hematoma or ecchymosis.  The device was interrogated and found to be functioning normally.  CXR was obtained and demonstrated no pneumothorax status post device implantation..  Wound care, arm mobility, and restrictions were reviewed with the patient.  The patient was examined and considered stable for discharge to home.   Beta blocker has been deferred by HF team, so will plan to address as outpatient now s/p CRT. ACE/ARB/ARNi have been contraindicated due to renal disease.   Physical  Exam: Vitals:   12/03/21 1900 12/03/21 2327 12/04/21 0411 12/04/21 0809  BP:  114/68 122/72 130/78  Pulse:   76 76  Resp:  (!) '21 20 19  '$ Temp: 97.6 F (36.4 C) 98.5 F (36.9 C) 98.3 F (36.8 C) 98.5 F (36.9 C)  TempSrc: Oral Oral Oral Oral  SpO2:  97% 97% 97%  Weight:      Height:        GEN- The patient is well appearing, alert and oriented x 3 today.   HEENT: normocephalic, atraumatic; sclera clear, conjunctiva pink; hearing intact; oropharynx clear; neck supple, no JVP Lymph- no cervical lymphadenopathy Lungs- Clear to ausculation bilaterally, normal work of breathing.  No wheezes, rales, rhonchi Heart- Regular rate and rhythm, no murmurs, rubs or gallops, PMI not laterally displaced GI- soft, non-tender, non-distended, bowel sounds present, no hepatosplenomegaly Extremities- no clubbing, cyanosis, or edema; DP/PT/radial pulses 2+ bilaterally MS- no significant deformity or atrophy Skin- warm and dry, no rash or lesion. ICD site stable. Pressure dressing applied.  Psych- euthymic mood, full affect Neuro- strength and sensation are intact   Labs:   Lab Results  Component Value Date   WBC 5.4 11/23/2021   HGB 12.5 (L) 11/23/2021   HCT 36.6 (L) 11/23/2021   MCV 81 11/23/2021   PLT 133 (L) 11/23/2021   No results for input(s): "NA", "K", "CL", "CO2", "BUN", "CREATININE", "CALCIUM", "PROT", "BILITOT", "ALKPHOS", "ALT", "AST", "GLUCOSE" in the last 168 hours.  Invalid input(s): "LABALBU"  Discharge Medications:  Allergies as of 12/04/2021   No Known Allergies      Medication List     TAKE these medications  acetaminophen 325 MG tablet Commonly known as: TYLENOL Take 1-2 tablets (325-650 mg total) by mouth every 4 (four) hours as needed for mild pain.   aspirin EC 81 MG tablet Take 1 tablet (81 mg total) by mouth daily. Swallow whole.   empagliflozin 10 MG Tabs tablet Commonly known as: JARDIANCE Take 1 tablet (10 mg total) by mouth daily.   Fish Oil  1000 MG Caps Take 1,000 mg by mouth daily.   isosorbide-hydrALAZINE 20-37.5 MG tablet Commonly known as: BIDIL Take 1 tablet by mouth 3 (three) times daily.   KEYTRUDA IV Inject 1 Dose into the vein once a week. Every 3  weeks   multivitamin capsule Take 1 capsule by mouth daily.   potassium chloride 10 MEQ tablet Commonly known as: KLOR-CON M TAKE 1 TABLET(10 MEQ) BY MOUTH DAILY   PreviDent 5000 Enamel Protect 1.1-5 % Gel Generic drug: Sod Fluoride-Potassium Nitrate Place 1 application. onto teeth at bedtime.   prochlorperazine 10 MG tablet Commonly known as: COMPAZINE Take 1 tablet (10 mg total) by mouth every 6 (six) hours as needed for nausea or vomiting.   promethazine 12.5 MG tablet Commonly known as: PHENERGAN Take 1 tablet (12.5 mg total) by mouth every 4 (four) hours as needed for nausea or vomiting.   rosuvastatin 40 MG tablet Commonly known as: CRESTOR Take 1 tablet (40 mg total) by mouth daily.   torsemide 20 MG tablet Commonly known as: DEMADEX Take 1 tablet (20 mg total) by mouth daily.   UP4 PROBIOTICS ADULT PO Take 1 capsule by mouth daily.        Disposition:    Follow-up Information     Shirley Friar, PA-C Follow up.   Specialty: Physician Assistant Why: on 12/5 at 1130 am for pressure dressing removal Contact information: [address] [place] [address] [phone]                 Duration of Discharge Encounter: Greater than 30 minutes including physician time.  Jacalyn Lefevre, PA-C  12/04/2021 9:53 AM

## 2021-12-04 NOTE — Discharge Instructions (Addendum)
After Your ICD (Implantable Cardiac Defibrillator)   You have a Abbott ICD  ACTIVITY Do not lift your arm above shoulder height for 1 week after your procedure. After 7 days, you may progress as below.  You should remove your sling 24 hours after your procedure, unless otherwise instructed by your provider.     Friday December 11, 2021  Saturday December 12, 2021 Sunday December 13, 2021 Monday December 14, 2021   Do not lift, push, pull, or carry anything over 10 pounds with the affected arm until 6 weeks (Friday January 15, 2022 ) after your procedure.   You may drive AFTER your wound check, unless you have been told otherwise by your provider.   Ask your healthcare provider when you can go back to work   INCISION/Dressing If you are on a blood thinner such as Coumadin, Xarelto, Eliquis, Plavix, or Pradaxa please confirm with your provider when this should be resumed.   Monitor your defibrillator site for redness, swelling, and drainage. Call the device clinic at [phone] if you experience these symptoms or fever/chills.  If your incision is sealed with Steri-strips or staples, you may shower 7 days after your procedure or when told by your provider. Do not remove the steri-strips or let the shower hit directly on your site. You may wash around your site with soap and water.    If you were discharged in a sling, please do not wear this during the day more than 48 hours after your surgery unless otherwise instructed. This may increase the risk of stiffness and soreness in your shoulder.   Avoid lotions, ointments, or perfumes over your incision until it is well-healed.  You may use a hot tub or a pool AFTER your wound check appointment if the incision is completely closed.  Your ICD is designed to protect you from life threatening heart rhythms. Because of this, you may receive a shock.   1 shock with no symptoms:  Call the office during business hours. 1 shock with symptoms  (chest pain, chest pressure, dizziness, lightheadedness, shortness of breath, overall feeling unwell):  Call 911. If you experience 2 or more shocks in 24 hours:  Call 911. If you receive a shock, you should not drive for 6 months per the [place] DMV IF you receive appropriate therapy from your ICD.   ICD Alerts:  Some alerts are vibratory and others beep. These are NOT emergencies. Please call our office to let us know. If this occurs at night or on weekends, it can wait until the next business day. Send a remote transmission.  If your device is capable of reading fluid status (for heart failure), you will be offered monthly monitoring to review this with you.   DEVICE MANAGEMENT Remote monitoring is used to monitor your ICD from home. This monitoring is scheduled every 91 days by our office. It allows [place] to keep an eye on the functioning of your device to ensure it is working properly. You will routinely see your Electrophysiologist annually (more often if necessary).   You should receive your ID card for your new device in 4-8 weeks. Keep this card with you at all times once received. Consider wearing a medical alert bracelet or necklace.  Your ICD  may be MRI compatible. This will be discussed at your next office visit/wound check.  You should avoid contact with strong electric or magnetic fields.   Do not use amateur (ham) radio equipment or electric (arc) welding torches. MP3  player headphones with magnets should not be used. Some devices are safe to use if held at least 12 inches (30 cm) from your defibrillator. These include power tools, lawn mowers, and speakers. If you are unsure if something is safe to use, ask your health care provider.  When using your cell phone, hold it to the ear that is on the opposite side from the defibrillator. Do not leave your cell phone in a pocket over the defibrillator.  You may safely use electric blankets, heating pads, computers, and microwave ovens.  Call  the office right away if: You have chest pain. You feel more than one shock. You feel more short of breath than you have felt before. You feel more light-headed than you have felt before. Your incision starts to open up.  This information is not intended to replace advice given to you by your health care provider. Make sure you discuss any questions you have with your health care provider.

## 2021-12-04 NOTE — TOC Progression Note (Signed)
Transition of Care [facility]) - Progression Note    Patient Details  Name: BRAIDAN RICCIARDI MRN: [id number] Date of Birth: [birth year]  Transition of Care [facility]) CM/SW Contact  Zenon Mayo, RN Phone Number: 12/04/2021, 8:37 AM  Clinical Narrative:    From home, for ICD implant.  TOC following.        Expected Discharge Plan and Services                                                 Social Determinants of Health (SDOH) Interventions    Readmission Risk Interventions    07/22/2021   10:22 AM  Readmission Risk Prevention Plan  Transportation Screening Complete  PCP or Specialist Appt within 3-5 Days Complete  HRI or [place] Complete  Social Work Consult for [place] Planning/Counseling Complete  Palliative Care Screening Not Applicable  Medication Review [employer]) Complete

## 2021-12-04 NOTE — TOC Transition Note (Signed)
Transition of Care [facility]) - CM/SW Discharge Note   Patient Details  Name: Bryce Cain MRN: [id number] Date of Birth: 07-05-1950  Transition of Care [facility] CM/SW Contact:  Zenon Mayo, RN Phone Number: 12/04/2021, 10:12 AM   Clinical Narrative:    Patient for dc today, he has no needs.  Wife will transport him home.          Patient Goals and CMS Choice        Discharge Placement                       Discharge Plan and Services                                     Social Determinants of Health (SDOH) Interventions     Readmission Risk Interventions    07/22/2021   10:22 AM  Readmission Risk Prevention Plan  Transportation Screening Complete  PCP or Specialist Appt within 3-5 Days Complete  HRI or [place] Complete  Social Work Consult for [place] Planning/Counseling Complete  Palliative Care Screening Not Applicable  Medication Review Press photographer) Complete

## 2021-12-05 ENCOUNTER — Other Ambulatory Visit: Payer: Self-pay

## 2021-12-08 ENCOUNTER — Ambulatory Visit: Payer: Medicare PPO | Attending: Student | Admitting: Student

## 2021-12-08 ENCOUNTER — Encounter: Payer: Self-pay | Admitting: Student

## 2021-12-08 VITALS — Ht 70.0 in | Wt 230.8 lb

## 2021-12-08 DIAGNOSIS — I5022 Chronic systolic (congestive) heart failure: Secondary | ICD-10-CM

## 2021-12-08 NOTE — Patient Instructions (Signed)
Medication Instructions:  Your physician recommends that you continue on your current medications as directed. Please refer to the Current Medication list given to you today.  *If you need a refill on your cardiac medications before your next appointment, please call your pharmacy*   Lab Work: None  If you have labs (blood work) drawn today and your tests are completely normal, you will receive your results only by: MyChart Message (if you have MyChart) OR A paper copy in the mail If you have any lab test that is abnormal or we need to change your treatment, we will call you to review the results.   Follow-Up: At [facility], you and your health needs are our priority.  As part of our continuing mission to provide you with exceptional heart care, we have created designated Provider Care Teams.  These Care Teams include your primary Cardiologist (physician) and Advanced Practice Providers (APPs -  Physician Assistants and Nurse Practitioners) who all work together to provide you with the care you need, when you need it.   Your next appointment:   As scheduled  Important Information About [place]       

## 2021-12-08 NOTE — Progress Notes (Signed)
Pressure dressing removed. Site stable with minimal ecchymosis. No bleeding or discharge.   Capture thresholds stable from implant, LV threshold 1.375 V @ 0.5 ms  Reviewed on-going wound care and arm restrictions.    Keep regular wound check next week.   Pt not on [place].   Legrand Como [address]" [place], PA-C  12/08/2021 11:57 AM

## 2021-12-09 ENCOUNTER — Other Ambulatory Visit: Payer: Self-pay

## 2021-12-09 ENCOUNTER — Inpatient Hospital Stay (HOSPITAL_BASED_OUTPATIENT_CLINIC_OR_DEPARTMENT_OTHER): Payer: Medicare PPO

## 2021-12-09 ENCOUNTER — Inpatient Hospital Stay: Payer: Medicare PPO | Attending: Oncology

## 2021-12-09 ENCOUNTER — Inpatient Hospital Stay: Payer: Medicare PPO | Admitting: Oncology

## 2021-12-09 VITALS — BP 107/70 | HR 72 | Temp 98.0°F | Resp 19 | Ht 70.0 in | Wt 231.1 lb

## 2021-12-09 DIAGNOSIS — Z5112 Encounter for antineoplastic immunotherapy: Secondary | ICD-10-CM | POA: Diagnosis not present

## 2021-12-09 DIAGNOSIS — C641 Malignant neoplasm of right kidney, except renal pelvis: Secondary | ICD-10-CM | POA: Insufficient documentation

## 2021-12-09 DIAGNOSIS — I509 Heart failure, unspecified: Secondary | ICD-10-CM | POA: Insufficient documentation

## 2021-12-09 DIAGNOSIS — Z905 Acquired absence of kidney: Secondary | ICD-10-CM | POA: Diagnosis not present

## 2021-12-09 LAB — CMP (CANCER CENTER ONLY)
ALT: 20 U/L (ref 0–44)
AST: 41 U/L (ref 15–41)
Albumin: 4.2 g/dL (ref 3.5–5.0)
Alkaline Phosphatase: 51 U/L (ref 38–126)
Anion gap: 7 (ref 5–15)
BUN: 48 mg/dL — ABNORMAL HIGH (ref 8–23)
CO2: 26 mmol/L (ref 22–32)
Calcium: 10.6 mg/dL — ABNORMAL HIGH (ref 8.9–10.3)
Chloride: 102 mmol/L (ref 98–111)
Creatinine: 2.58 mg/dL — ABNORMAL HIGH (ref 0.61–1.24)
GFR, Estimated: 25 mL/min — ABNORMAL LOW (ref >60)
Glucose, Bld: 95 mg/dL (ref 70–99)
Potassium: 4.4 mmol/L (ref 3.5–5.1)
Sodium: 135 mmol/L (ref 135–145)
Total Bilirubin: 0.9 mg/dL (ref 0.3–1.2)
Total Protein: 7.5 g/dL (ref 6.5–8.1)

## 2021-12-09 LAB — CBC WITH DIFFERENTIAL (CANCER CENTER ONLY)
Abs Immature Granulocytes: 0.01 10*3/uL (ref 0.00–0.07)
Basophils Absolute: 0.1 10*3/uL (ref 0.0–0.1)
Basophils Relative: 1 %
Eosinophils Absolute: 0.4 10*3/uL (ref 0.0–0.5)
Eosinophils Relative: 8 %
HCT: 34 % — ABNORMAL LOW (ref 39.0–52.0)
Hemoglobin: 12.2 g/dL — ABNORMAL LOW (ref 13.0–17.0)
Immature Granulocytes: 0 %
Lymphocytes Relative: 16 %
Lymphs Abs: 0.9 10*3/uL (ref 0.7–4.0)
MCH: 28.1 pg (ref 26.0–34.0)
MCHC: 35.9 g/dL (ref 30.0–36.0)
MCV: 78.3 fL — ABNORMAL LOW (ref 80.0–100.0)
Monocytes Absolute: 0.8 10*3/uL (ref 0.1–1.0)
Monocytes Relative: 13 %
Neutro Abs: 3.6 10*3/uL (ref 1.7–7.7)
Neutrophils Relative %: 62 %
Platelet Count: 148 10*3/uL — ABNORMAL LOW (ref 150–400)
RBC: 4.34 MIL/uL (ref 4.22–5.81)
RDW: 16 % — ABNORMAL HIGH (ref 11.5–15.5)
WBC Count: 5.8 10*3/uL (ref 4.0–10.5)
nRBC: 0 % (ref 0.0–0.2)

## 2021-12-09 LAB — TSH: TSH: 155.715 u[IU]/mL — ABNORMAL HIGH (ref 0.350–4.500)

## 2021-12-09 MED ORDER — SODIUM CHLORIDE 0.9 % IV SOLN
200.0000 mg | Freq: Once | INTRAVENOUS | Status: AC
  Administered 2021-12-09: 200 mg via INTRAVENOUS
  Filled 2021-12-09: qty 200

## 2021-12-09 MED ORDER — SODIUM CHLORIDE 0.9 % IV SOLN: Freq: Once | INTRAVENOUS | Status: AC

## 2021-12-09 MED FILL — Lidocaine HCl Local Preservative Free (PF) Inj 1%: INTRAMUSCULAR | Qty: 30 | Status: AC

## 2021-12-09 MED FILL — Lidocaine HCl Local Inj 1%: INTRAMUSCULAR | Qty: 60 | Status: AC

## 2021-12-09 NOTE — Progress Notes (Signed)
Hematology and Oncology Follow Up Visit  Bryce Cain [id number] 03-07-1950 71 y.o. 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   Prior Therapy: Robotic assisted laparoscopic right radical nephrectomy on June 23, 2021.  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  Current therapy: Pembrolizumab 200 mg every 3 weeks started on August 19, 2021.  He is here for cycle 6 to complete one year in total 17 cycles.   Interim History: Mr. Rounds presents today for repeat evaluation.  Since the last visit, he underwent ICD implantation on November 30 without any major complications.  He still have some pain around the surgical site without any bleeding.  He denied any complications related to Pembrolizumab.  He denies any nausea, vomiting or fatigue.  He denies any worsening neuropathy or GI complaints.  He denies shortness of breath or headaches.    Medications: Reviewed without changes. Current Outpatient Medications  Medication Sig Dispense Refill   acetaminophen (TYLENOL) 325 MG tablet Take 1-2 tablets (325-650 mg total) by mouth every 4 (four) hours as needed for mild pain.     aspirin EC 81 MG tablet Take 1 tablet (81 mg total) by mouth daily. Swallow whole. 90 tablet 3   empagliflozin (JARDIANCE) 10 MG TABS tablet Take 1 tablet (10 mg total) by mouth daily. 90 tablet 3   isosorbide-hydrALAZINE (BIDIL) 20-37.5 MG tablet Take 1 tablet by mouth 3 (three) times daily. 90 tablet 6   Multiple Vitamin (MULTIVITAMIN) capsule Take 1 capsule by mouth daily.     Omega-3 Fatty Acids (FISH OIL) 1000 MG CAPS Take 1,000 mg by mouth daily.     Pembrolizumab (KEYTRUDA IV) Inject 1 Dose into the vein once a week. Every 3  weeks     potassium chloride  (KLOR-CON M) 10 MEQ tablet TAKE 1 TABLET(10 MEQ) BY MOUTH DAILY 30 tablet 3   PREVIDENT 5000 ENAMEL PROTECT 1.1-5 % GEL Place 1 application. onto teeth at bedtime.     Probiotic Product (UP4 PROBIOTICS ADULT PO) Take 1 capsule by mouth daily.     prochlorperazine (COMPAZINE) 10 MG tablet Take 1 tablet (10 mg total) by mouth every 6 (six) hours as needed for nausea or vomiting. 30 tablet 0   promethazine (PHENERGAN) 12.5 MG tablet Take 1 tablet (12.5 mg total) by mouth every 4 (four) hours as needed for nausea or vomiting. 15 tablet 0   rosuvastatin (CRESTOR) 40 MG tablet Take 1 tablet (40 mg total) by mouth daily. 90 tablet 3   torsemide (DEMADEX) 20 MG tablet Take 1 tablet (20 mg total) by mouth daily. 30 tablet 6   No current facility-administered medications for this visit.     Allergies: No Known Allergies    Physical Exam:   Blood pressure 107/70, pulse 72, temperature 98 F (36.7 C), temperature source Temporal, resp. rate 19, height '5\' 10"'$  (1.778 m), weight 231 lb 1.6 oz (104.8 kg), SpO2 97 %.   ECOG: 1    General appearance: Comfortable appearing without any discomfort Head: Normocephalic without any trauma Oropharynx: Mucous membranes are moist and pink without any thrush or ulcers. Eyes: Pupils are equal and round reactive to light. Lymph nodes: No cervical, supraclavicular, inguinal or axillary lymphadenopathy.   Heart:regular rate and rhythm.  S1 and  S2 without leg edema. Lung: Clear without any rhonchi or wheezes.  No dullness to percussion. Abdomin: Soft, nontender, nondistended with good bowel sounds.  No hepatosplenomegaly. Musculoskeletal: No joint deformity or effusion.  Full range of motion noted. Neurological: No deficits noted on motor, sensory and deep tendon reflex exam. Skin: No petechial rash or dryness.  Appeared moist.         Lab Results: Lab Results  Component Value Date   WBC 5.8 12/09/2021   HGB 12.2 (L) 12/09/2021   HCT 34.0 (L)  12/09/2021   MCV 78.3 (L) 12/09/2021   PLT 148 (L) 12/09/2021     Chemistry      Component Value Date/Time   NA 135 12/09/2021 1350   NA 137 11/23/2021 1145   K 4.4 12/09/2021 1350   CL 102 12/09/2021 1350   CO2 26 12/09/2021 1350   BUN 48 (H) 12/09/2021 1350   BUN 48 (H) 11/23/2021 1145   CREATININE 2.58 (H) 12/09/2021 1350   GLU 98 07/10/2021 0000      Component Value Date/Time   CALCIUM 10.6 (H) 12/09/2021 1350   ALKPHOS 51 12/09/2021 1350   AST 41 12/09/2021 1350   ALT 20 12/09/2021 1350   BILITOT 0.9 12/09/2021 1350          Impression and Plan:  71 year old man with with:  1. 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   He is currently on adjuvant Pembrolizumab without any major complications.  Risks and benefits associated with this treatment were discussed at this time.  Autoimmune considerations, GI toxicity as well as dermatological issues were reiterated.  The plan is to continue and complete 17 cycles of therapy if possible.  Repeat imaging studies in the future will be conducted in the care of Dr. Lovena Neighbours.     2.  IV access: Port-A-Cath has been deferred for the time being.  Peripheral veins are currently in use.   3.  Autoimmune complications: I continue to educate him about these complications including pneumonitis, colitis and thyroid disease.  He has not experienced any at this time.  4.  Congestive heart failure: ICD has been inserted without any issues.   5.  Follow-up: He will return in 4 weeks for the next cycle of therapy based on his wishes to proceed with treatment after the new year.     30  minutes were dedicated to this encounter.  The time was spent on updating disease status, treatment choices and outlining future plan of care review.     Zola Button, MD 12/6/20232:51 PM

## 2021-12-10 LAB — T4: T4, Total: 1.6 ug/dL — ABNORMAL LOW (ref 4.5–12.0)

## 2021-12-15 ENCOUNTER — Encounter (HOSPITAL_COMMUNITY): Payer: Self-pay | Admitting: Internal Medicine

## 2021-12-15 ENCOUNTER — Ambulatory Visit (HOSPITAL_COMMUNITY)
Admission: RE | Admit: 2021-12-15 | Discharge: 2021-12-15 | Disposition: A | Discharge status: Home / Self Care | Payer: Medicare PPO | Source: Ambulatory Visit | Attending: Internal Medicine | Admitting: Internal Medicine

## 2021-12-15 VITALS — BP 110/68 | HR 68 | Wt 230.0 lb

## 2021-12-15 DIAGNOSIS — Z7984 Long term (current) use of oral hypoglycemic drugs: Secondary | ICD-10-CM | POA: Insufficient documentation

## 2021-12-15 DIAGNOSIS — I2510 Atherosclerotic heart disease of native coronary artery without angina pectoris: Secondary | ICD-10-CM | POA: Insufficient documentation

## 2021-12-15 DIAGNOSIS — G4733 Obstructive sleep apnea (adult) (pediatric): Secondary | ICD-10-CM | POA: Diagnosis not present

## 2021-12-15 DIAGNOSIS — I428 Other cardiomyopathies: Secondary | ICD-10-CM | POA: Diagnosis not present

## 2021-12-15 DIAGNOSIS — E1122 Type 2 diabetes mellitus with diabetic chronic kidney disease: Secondary | ICD-10-CM | POA: Diagnosis not present

## 2021-12-15 DIAGNOSIS — I5022 Chronic systolic (congestive) heart failure: Secondary | ICD-10-CM

## 2021-12-15 DIAGNOSIS — I493 Ventricular premature depolarization: Secondary | ICD-10-CM | POA: Diagnosis not present

## 2021-12-15 DIAGNOSIS — Z95810 Presence of automatic (implantable) cardiac defibrillator: Secondary | ICD-10-CM | POA: Diagnosis not present

## 2021-12-15 DIAGNOSIS — I130 Hypertensive heart and chronic kidney disease with heart failure and stage 1 through stage 4 chronic kidney disease, or unspecified chronic kidney disease: Secondary | ICD-10-CM | POA: Diagnosis not present

## 2021-12-15 DIAGNOSIS — I472 Ventricular tachycardia, unspecified: Secondary | ICD-10-CM | POA: Diagnosis not present

## 2021-12-15 DIAGNOSIS — I447 Left bundle-branch block, unspecified: Secondary | ICD-10-CM | POA: Diagnosis not present

## 2021-12-15 DIAGNOSIS — L905 Scar conditions and fibrosis of skin: Secondary | ICD-10-CM | POA: Diagnosis not present

## 2021-12-15 DIAGNOSIS — C641 Malignant neoplasm of right kidney, except renal pelvis: Secondary | ICD-10-CM | POA: Diagnosis not present

## 2021-12-15 DIAGNOSIS — N184 Chronic kidney disease, stage 4 (severe): Secondary | ICD-10-CM | POA: Diagnosis not present

## 2021-12-15 DIAGNOSIS — Z905 Acquired absence of kidney: Secondary | ICD-10-CM | POA: Diagnosis not present

## 2021-12-15 NOTE — Patient Instructions (Signed)
Medication Changes:  None, continue current medications  Lab Work:  none  Testing/Procedures:  Your physician has requested that you have an echocardiogram. Echocardiography is a painless test that uses sound waves to create images of your heart. It provides your doctor with information about the size and shape of your heart and how well your heart's chambers and valves are working. This procedure takes approximately one hour. There are no restrictions for this procedure. Please do NOT wear cologne, perfume, aftershave, or lotions (deodorant is allowed). Please arrive 15 minutes prior to your appointment time. IN 3 MONTHS  Referrals:  none  Special Instructions // Education:  Do the following things EVERYDAY: Weigh yourself in the morning before breakfast. Write it down and keep it in a log. Take your medicines as prescribed Eat low salt foods--Limit salt (sodium) to 2000 mg per day.  Stay as active as you can everyday Limit all fluids for the day to less than 2 liters   Follow-Up in: 3 months with echocardiogram  At the [facility], you and your health needs are our priority. We have a designated team specialized in the treatment of Heart Failure. This Care Team includes your primary Heart Failure Specialized Cardiologist (physician), Advanced Practice Providers (APPs- Physician Assistants and Nurse Practitioners), and Pharmacist who all work together to provide you with the care you need, when you need it.   You may see any of the following providers on your designated Care Team at your next follow up:  Dr. Glori Bickers Dr. Loralie Champagne Dr. Roxana Hires, NP Lyda Jester, [place] [facility] [place], [place] Forestine Na, NP Audry Riles, PharmD   Please be sure to bring in all your medications bottles to every appointment.   Need to Contact us:  If you have any questions or concerns before your next appointment please send  [place] a message through [place] or call our office at [phone].    TO LEAVE A MESSAGE FOR THE NURSE SELECT OPTION 2, PLEASE LEAVE A MESSAGE INCLUDING: YOUR NAME DATE OF BIRTH CALL BACK NUMBER REASON FOR CALL**this is important as we prioritize the call backs  YOU WILL RECEIVE A CALL BACK THE SAME DAY AS LONG AS YOU CALL BEFORE 4:00 PM

## 2021-12-15 NOTE — Progress Notes (Signed)
ADVANCED HF CLINIC CONSULT NOTE   Primary Care: [name], [name], MD HF Cardiologist: Dr. 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   Admitted 2/23 with new onset systolic heart failure. Echo EF 20-25% with anterior/apical WMA. Moderate MR. RV normal.Cath with minimal nonobstructive CAD.  cMRI LVEF 16%, RVEF 52%, LGE basal septal midwall (scar pattern seen with NICM). Cardiomyopathy felt to be likely due to LBBB +/- frequent PVCs . EP consulted -> need to wait 3 months for CRT. Discharged home with LifeVest, weight 219 lbs.  Readmitted in 03/20/21 with low output HF and transient shock. Treated with milrinone and improved. Repeat echo EF 20% . Milrinone weaned off. Seen by EP regarding CRT but recommended waiting 3 months for GDMT   Renal u/s found large right renal mass and CT confirmed RCC.  Seen in Urology Clinic and felt to need relatively urgent right nephrectomy. s/p R nephrectomy d/t RCC 06/2021. Post nephrectomy HF meds stopped due to hypotension and AKI. Entresto, spironolactone, and torsemide held at discharge. He had f/u in HF clinic and creatinine remained elevated so meds remained on hold.   Admitted 07/19/21 with A/C HFrEF. Diuresed with IV lasix and transitioned to torsemide 20 mg daily. Overall diuresed 4 pounds. Renal function followed closely.  He will remain off spiro or entresto with elevated creatinine. Started on bidil for afterload reduction. Discharged 7/119/23   S/p Abbott CRT 10/27/21. Had site hematoma which resolved.  Today he returns for HF follow up. Feels ok. Says he is still trying to bounce back from ICD implant. Remains Keytruda q 3 weeks for RCC. Feels like breathing is improved after CRT. Able to do ADLs without problem. No edema, orthopnea or PND.   Cardiac Studies: - Echo (2/23): EF 20-25% with anterior/apical WMA suggestive of previous anterior MI.  Moderate MR. RV normal    - R/LHC (2/23):   Prox RCA lesion is 30% stenosed.   1st Diag lesion is 20/% stenosed.   Prox Cx to Mid Cx lesion is 20% stenosed.  Ao = 102/70 (85) LV = 104/24 RA =  10 RV = 47/17 PA = 52/22 (33) PCW = 25 Fick cardiac output/index = 5.5/2.5 PVR =1.5 WU Ao sat = 98% PA sat = 65%, 66%   Assessment: 1. 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 Elevated filling pressures with normal cardiac output   - cMRI (2/23): LVEF 16%, RVEF 52%, LGE basal septal midwall (scar pattern seen with NICM).     Past Medical History:  Diagnosis Date   CHF (congestive heart failure) (HCC)    DM (diabetes mellitus), type 2 (HCC)    OSA on CPAP    Current Outpatient Medications  Medication Sig Dispense Refill   acetaminophen (TYLENOL) 500 MG tablet Take 500 mg by mouth every 6 (six) hours as needed.     aspirin EC 81 MG tablet Take 1 tablet (81 mg total) by mouth daily. Swallow whole. 90 tablet 3   docusate sodium (COLACE) 100 MG capsule Take 100 mg by mouth daily as needed for mild constipation.     empagliflozin (JARDIANCE) 10 MG TABS tablet Take 1 tablet (10 mg total) by mouth daily. 90 tablet 3   isosorbide-hydrALAZINE (BIDIL) 20-37.5 MG tablet Take 1 tablet by mouth 3 (three) times daily. 90 tablet 6   Multiple  Vitamin (MULTIVITAMIN) capsule Take 1 capsule by mouth daily.     Omega-3 Fatty Acids (FISH OIL) 1000 MG CAPS Take by mouth.     Pembrolizumab (KEYTRUDA IV) Inject 1 Dose into the vein once a week. Every 3  weeks     potassium chloride (KLOR-CON M) 10 MEQ tablet Take 1 tablet (10 mEq total) by mouth daily. 30 tablet 3   PREVIDENT 5000 ENAMEL PROTECT 1.1-5 % GEL Place 1 application. onto teeth at bedtime.     Probiotic Product (UP4 PROBIOTICS ADULT PO) Take by mouth.     prochlorperazine (COMPAZINE) 10 MG tablet Take 1 tablet (10 mg total) by mouth every 6 (six) hours as needed for nausea or vomiting.  30 tablet 0   promethazine (PHENERGAN) 12.5 MG tablet Take 1 tablet (12.5 mg total) by mouth every 4 (four) hours as needed for nausea or vomiting. 15 tablet 0   rosuvastatin (CRESTOR) 40 MG tablet Take 1 tablet (40 mg total) by mouth daily. 90 tablet 3   torsemide (DEMADEX) 20 MG tablet Take 1 tablet (20 mg total) by mouth daily. 30 tablet 6   No current facility-administered medications for this encounter.   No Known Allergies  Social History   Socioeconomic History   Marital status: Married    Spouse name: Not on file   Number of children: Not on file   Years of education: Not on file   Highest education level: Not on file  Occupational History   Not on file  Tobacco Use   Smoking status: Former    Types: Pipe    Quit date: 02/05/1991    Years since quitting: 30.5   Smokeless tobacco: Not on file  Substance and Sexual Activity   Alcohol use: Not on file   Drug use: Not on file   Sexual activity: Not on file  Other Topics Concern   Not on file  Social History Narrative   Not on file   Social Determinants of Health   Financial Resource Strain: Low Risk  (04/02/2021)   Overall Financial Resource Strain (CARDIA)    Difficulty of Paying Living Expenses: Not hard at all  Food Insecurity: No Food Insecurity (04/02/2021)   Hunger Vital Sign    Worried About Running Out of Food in the Last Year: Never true    Waterproof in the Last Year: Never true  Transportation Needs: No Transportation Needs (04/02/2021)   PRAPARE - Hydrologist (Medical): No    Lack of Transportation (Non-Medical): No  Physical Activity: Not on file  Stress: Not on file  Social Connections: Not on file  Intimate Partner Violence: Not on file   Family History  Problem Relation Age of Onset   Heart attack Father    Wt Readings from Last 3 Encounters:  09/03/21 99.6 kg (219 lb 9.6 oz)  08/19/21 98.2 kg (216 lb 8 oz)  08/07/21 97.1 kg (214 lb)   BP 110/60   Pulse 77    Wt 99.6 kg (219 lb 9.6 oz)   SpO2 95%   BMI 31.51 kg/m   PHYSICAL EXAM: General:  Well appearing. No resp difficulty HEENT: normal Neck: supple. no JVD. Carotids 2+ bilat; no bruits. No lymphadenopathy or thryomegaly appreciated. Cor: PMI nondisplaced. Regular rate & rhythm. ICD site healing well  Lungs: clear Abdomen: obese soft, nontender, nondistended. No hepatosplenomegaly. No bruits or masses. Good bowel sounds. Extremities: no cyanosis, clubbing, rash, edema Neuro: alert & orientedx3,  cranial nerves grossly intact. moves all 4 extremities w/o difficulty. Affect pleasant  ASSESSMENT & PLAN:  1. 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  - cMRI: LVEF 62%, RV systolic function  13%, basal septal midwall late gadolinium enhancement (scar pattern seen in NICM and associated with worse prognosis), RV insertion LGE  - Readmit ADHF/shock 3/23. Echo EF 20% Briefly required milrinone.  - Echo 05/29/21 EF <20% LV markedly dilated. RV normal. Mild MR.  - s/p Abbott CRT-D 10/23 - Stable/improved NYHA II. Volume status looks good - Continue torsemide '20mg'$  daily  - Continue Jardiance 10 mg daily.  Arlyce Harman and entresto on hold due to elevated creatinine  - Continue BIDI 1 tab tid. Avoid hypotension with CKD IV  - ICD interrogated today in clinic today No AF/VT. 98% BiV pacing - Return in 2-3 months with repeat echo to assess for EF improvement after CRT  2. 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 DM2 - A1c 5.4% - On SGLT2i. - Per PCP   4. CKD IV - Continue Jardiance - Scr now ~2.5 - Recent labs reviewed and stable with Scr 2.58 on 12/09/21   5. OSA - Continue CPAP. - Follows with Dr. Maxwell Caul at [facility].   6. Minimal non-obstructive CAD - On ASA/statin. - No s/s angina  - LDL 74.   7. 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 Post ECG QRS 174 -> 164  8. Large right renal cell CA - 06/2021 S/P R Nephrectomy - Now on Keytruda for 17 doses    Glori Bickers, MD  3:28 PM

## 2021-12-15 NOTE — Addendum Note (Signed)
Encounter addended by: Scarlette Calico, RN on: 12/15/2021 2:56 PM  Actions taken: Order list changed, Diagnosis association updated, Clinical Note Signed

## 2021-12-15 NOTE — Addendum Note (Signed)
Encounter addended by: Stanford Scotland, RN on: 12/15/2021 2:54 PM  Actions taken: Care Plan modified, Order list changed, Diagnosis association updated

## 2021-12-16 ENCOUNTER — Ambulatory Visit: Payer: Medicare PPO | Attending: Cardiology

## 2021-12-16 DIAGNOSIS — I5022 Chronic systolic (congestive) heart failure: Secondary | ICD-10-CM

## 2021-12-16 DIAGNOSIS — G4733 Obstructive sleep apnea (adult) (pediatric): Secondary | ICD-10-CM | POA: Diagnosis not present

## 2021-12-16 DIAGNOSIS — I447 Left bundle-branch block, unspecified: Secondary | ICD-10-CM

## 2021-12-16 DIAGNOSIS — I5043 Acute on chronic combined systolic (congestive) and diastolic (congestive) heart failure: Secondary | ICD-10-CM | POA: Diagnosis not present

## 2021-12-16 LAB — CUP PACEART INCLINIC DEVICE CHECK
Battery Remaining Longevity: 94 mo
Brady Statistic RA Percent Paced: 1.5 %
Brady Statistic RV Percent Paced: 98 %
Date Time Interrogation Session: 20231213172111
HighPow Impedance: 60.75 Ohm
Implantable Lead Connection Status: 753985
Implantable Lead Connection Status: 753985
Implantable Lead Connection Status: 753985
Implantable Lead Implant Date: 20231130
Implantable Lead Implant Date: 20231130
Implantable Lead Implant Date: 20231130
Implantable Lead Location: 753858
Implantable Lead Location: 753859
Implantable Lead Location: 753860
Implantable Pulse Generator Implant Date: 20231130
Lead Channel Impedance Value: 462.5 Ohm
Lead Channel Impedance Value: 537.5 Ohm
Lead Channel Impedance Value: 587.5 Ohm
Lead Channel Pacing Threshold Amplitude: 0.5 V
Lead Channel Pacing Threshold Amplitude: 0.5 V
Lead Channel Pacing Threshold Amplitude: 0.75 V
Lead Channel Pacing Threshold Amplitude: 0.75 V
Lead Channel Pacing Threshold Amplitude: 1.625 V
Lead Channel Pacing Threshold Amplitude: 2.25 V
Lead Channel Pacing Threshold Pulse Width: 0.5 ms
Lead Channel Pacing Threshold Pulse Width: 0.5 ms
Lead Channel Pacing Threshold Pulse Width: 0.5 ms
Lead Channel Pacing Threshold Pulse Width: 0.5 ms
Lead Channel Pacing Threshold Pulse Width: 0.5 ms
Lead Channel Pacing Threshold Pulse Width: 0.8 ms
Lead Channel Sensing Intrinsic Amplitude: 12 mV
Lead Channel Sensing Intrinsic Amplitude: 5 mV
Lead Channel Setting Pacing Amplitude: 1.5 V
Lead Channel Setting Pacing Amplitude: 1.625
Lead Channel Setting Pacing Amplitude: 2.125
Lead Channel Setting Pacing Pulse Width: 0.5 ms
Lead Channel Setting Pacing Pulse Width: 0.8 ms
Lead Channel Setting Sensing Sensitivity: 0.5 mV
Pulse Gen Serial Number: 211010483
Zone Setting Status: 755011

## 2021-12-16 NOTE — Progress Notes (Signed)
Wound check appointment. Steri-strips removed. Wound without redness or edema. Incision edges approximated, wound well healed. Normal device function. Thresholds, sensing, and impedances consistent with implant measurements. Device programmed at 3.5V for extra safety margin until 3 month visit. Histogram distribution appropriate for patient and level of activity. No mode switches or ventricular arrhythmias noted. Patient educated about wound care, arm mobility, lifting restrictions, shock plan. ROV in 3 months with implanting physician. LV threshold programmed D1-M2 with threshold 2.25V at 0.34m.  Tested at 0.8 ms with improvement in threshold to 1.75V. Few other vectors tested with AT-see attachment.  Left programming at D1-M2 configuration with change to 0.840m

## 2021-12-16 NOTE — Patient Instructions (Signed)

## 2021-12-30 ENCOUNTER — Ambulatory Visit: Payer: Medicare PPO

## 2021-12-30 ENCOUNTER — Other Ambulatory Visit: Payer: Medicare PPO

## 2021-12-30 ENCOUNTER — Ambulatory Visit: Payer: Medicare PPO | Admitting: Oncology

## 2021-12-31 ENCOUNTER — Other Ambulatory Visit: Payer: Medicare PPO

## 2021-12-31 ENCOUNTER — Ambulatory Visit: Payer: Medicare PPO

## 2021-12-31 ENCOUNTER — Ambulatory Visit: Payer: Medicare PPO | Admitting: Physician Assistant

## 2022-01-06 ENCOUNTER — Inpatient Hospital Stay: Payer: Medicare PPO | Admitting: Oncology

## 2022-01-06 ENCOUNTER — Ambulatory Visit: Payer: Medicare PPO

## 2022-01-06 ENCOUNTER — Other Ambulatory Visit: Payer: Self-pay

## 2022-01-06 ENCOUNTER — Inpatient Hospital Stay: Payer: Medicare PPO | Attending: Oncology

## 2022-01-06 VITALS — BP 111/69 | HR 76 | Temp 97.8°F | Resp 17 | Ht 70.0 in | Wt 229.1 lb

## 2022-01-06 DIAGNOSIS — E032 Hypothyroidism due to medicaments and other exogenous substances: Secondary | ICD-10-CM | POA: Diagnosis not present

## 2022-01-06 DIAGNOSIS — Z5112 Encounter for antineoplastic immunotherapy: Secondary | ICD-10-CM | POA: Insufficient documentation

## 2022-01-06 DIAGNOSIS — Z7984 Long term (current) use of oral hypoglycemic drugs: Secondary | ICD-10-CM | POA: Insufficient documentation

## 2022-01-06 DIAGNOSIS — Z905 Acquired absence of kidney: Secondary | ICD-10-CM | POA: Insufficient documentation

## 2022-01-06 DIAGNOSIS — N189 Chronic kidney disease, unspecified: Secondary | ICD-10-CM | POA: Diagnosis not present

## 2022-01-06 DIAGNOSIS — E039 Hypothyroidism, unspecified: Secondary | ICD-10-CM | POA: Diagnosis not present

## 2022-01-06 DIAGNOSIS — C641 Malignant neoplasm of right kidney, except renal pelvis: Secondary | ICD-10-CM | POA: Insufficient documentation

## 2022-01-06 DIAGNOSIS — Z79899 Other long term (current) drug therapy: Secondary | ICD-10-CM | POA: Diagnosis not present

## 2022-01-06 DIAGNOSIS — Z7982 Long term (current) use of aspirin: Secondary | ICD-10-CM | POA: Insufficient documentation

## 2022-01-06 DIAGNOSIS — E1122 Type 2 diabetes mellitus with diabetic chronic kidney disease: Secondary | ICD-10-CM | POA: Insufficient documentation

## 2022-01-06 LAB — CMP (CANCER CENTER ONLY)
ALT: 23 U/L (ref 0–44)
AST: 50 U/L — ABNORMAL HIGH (ref 15–41)
Albumin: 4.4 g/dL (ref 3.5–5.0)
Alkaline Phosphatase: 49 U/L (ref 38–126)
Anion gap: 9 (ref 5–15)
BUN: 47 mg/dL — ABNORMAL HIGH (ref 8–23)
CO2: 24 mmol/L (ref 22–32)
Calcium: 10.9 mg/dL — ABNORMAL HIGH (ref 8.9–10.3)
Chloride: 99 mmol/L (ref 98–111)
Creatinine: 3.16 mg/dL (ref 0.61–1.24)
GFR, Estimated: 20 mL/min — ABNORMAL LOW (ref >60)
Glucose, Bld: 89 mg/dL (ref 70–99)
Potassium: 4.2 mmol/L (ref 3.5–5.1)
Sodium: 132 mmol/L — ABNORMAL LOW (ref 135–145)
Total Bilirubin: 0.9 mg/dL (ref 0.3–1.2)
Total Protein: 7.4 g/dL (ref 6.5–8.1)

## 2022-01-06 LAB — CBC WITH DIFFERENTIAL (CANCER CENTER ONLY)
Abs Immature Granulocytes: 0.02 10*3/uL (ref 0.00–0.07)
Basophils Absolute: 0 10*3/uL (ref 0.0–0.1)
Basophils Relative: 1 %
Eosinophils Absolute: 0.4 10*3/uL (ref 0.0–0.5)
Eosinophils Relative: 7 %
HCT: 33 % — ABNORMAL LOW (ref 39.0–52.0)
Hemoglobin: 12.3 g/dL — ABNORMAL LOW (ref 13.0–17.0)
Immature Granulocytes: 0 %
Lymphocytes Relative: 20 %
Lymphs Abs: 1 10*3/uL (ref 0.7–4.0)
MCH: 28.7 pg (ref 26.0–34.0)
MCHC: 37.3 g/dL — ABNORMAL HIGH (ref 30.0–36.0)
MCV: 77.1 fL — ABNORMAL LOW (ref 80.0–100.0)
Monocytes Absolute: 0.7 10*3/uL (ref 0.1–1.0)
Monocytes Relative: 14 %
Neutro Abs: 3 10*3/uL (ref 1.7–7.7)
Neutrophils Relative %: 58 %
Platelet Count: 135 10*3/uL — ABNORMAL LOW (ref 150–400)
RBC: 4.28 MIL/uL (ref 4.22–5.81)
RDW: 16.7 % — ABNORMAL HIGH (ref 11.5–15.5)
WBC Count: 5.2 10*3/uL (ref 4.0–10.5)
nRBC: 0 % (ref 0.0–0.2)

## 2022-01-06 LAB — TSH: TSH: 284.687 u[IU]/mL — ABNORMAL HIGH (ref 0.350–4.500)

## 2022-01-06 MED ORDER — SODIUM CHLORIDE 0.9 % IV SOLN: Freq: Once | INTRAVENOUS | Status: AC

## 2022-01-06 MED ORDER — SODIUM CHLORIDE 0.9 % IV SOLN
200.0000 mg | Freq: Once | INTRAVENOUS | Status: AC
  Administered 2022-01-06: 200 mg via INTRAVENOUS
  Filled 2022-01-06: qty 200

## 2022-01-06 NOTE — Progress Notes (Signed)
Hematology and Oncology Follow Up Visit  Bryce Cain [id number] Mar 13, 1950 72 y.o. 01/06/2022 12:56 PM Bryce Cain, Bryce Cain, MDShadad, Bryce Dad, MD   Principle Diagnosis: 62 year old man with kidney cancer diagnosed in June 2023.  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   Prior Therapy: Robotic assisted laparoscopic right radical nephrectomy on June 23, 2021.  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  Current therapy: Pembrolizumab 200 mg every 3 weeks started on August 19, 2021.  He is here for cycle 7 to complete one year in total 17 cycles.   Interim History: Mr. Bryce Cain returns today for a follow-up.  Since the last visit, he reports no major changes in his health.  He has reported more dry mouth and lost of taste however.  He denies any skin rashes or lesions.  He denies shortness of breath cough or difficulty breathing.  He denies any hospitalizations or illnesses.  He denies any excessive fatigue, myalgias or falls.    Medications: Updated on review. Current Outpatient Medications  Medication Sig Dispense Refill   acetaminophen (TYLENOL) 325 MG tablet Take 1-2 tablets (325-650 mg total) by mouth every 4 (four) hours as needed for mild pain.     aspirin EC 81 MG tablet Take 1 tablet (81 mg total) by mouth daily. Swallow whole. 90 tablet 3   empagliflozin (JARDIANCE) 10 MG TABS tablet Take 1 tablet (10 mg total) by mouth daily. 90 tablet 3   isosorbide-hydrALAZINE (BIDIL) 20-37.5 MG tablet Take 1 tablet by mouth 3 (three) times daily. 90 tablet 6   Multiple Vitamin (MULTIVITAMIN) capsule Take 1 capsule by mouth daily.     Omega-3 Fatty Acids (FISH OIL) 1000 MG CAPS Take 1,000 mg by mouth daily.     Pembrolizumab (KEYTRUDA IV) Inject 1 Dose into the vein every 21 ( twenty-one) days.     potassium chloride (KLOR-CON M) 10 MEQ tablet TAKE 1  TABLET(10 MEQ) BY MOUTH DAILY 30 tablet 3   PREVIDENT 5000 ENAMEL PROTECT 1.1-5 % GEL Place 1 application. onto teeth at bedtime.     Probiotic Product (UP4 PROBIOTICS ADULT PO) Take 1 capsule by mouth daily.     rosuvastatin (CRESTOR) 40 MG tablet Take 1 tablet (40 mg total) by mouth daily. 90 tablet 3   torsemide (DEMADEX) 20 MG tablet Take 1 tablet (20 mg total) by mouth daily. 30 tablet 6   No current facility-administered medications for this visit.     Allergies: No Known Allergies    Physical Exam:   Blood pressure 111/69, pulse 76, temperature 97.8 F (36.6 C), temperature source Temporal, resp. rate 17, height '5\' 10"'$  (1.778 m), weight 229 lb 1.6 oz (103.9 kg), SpO2 99 %.    ECOG: 1    General appearance: Alert, awake without any distress. Head: Atraumatic without abnormalities Oropharynx: Without any thrush or ulcers. Eyes: No scleral icterus. Lymph nodes: No lymphadenopathy noted in the cervical, supraclavicular, or axillary nodes Heart:regular rate and rhythm, without any murmurs or gallops.   Lung: Clear to auscultation without any rhonchi, wheezes or dullness to percussion. Abdomin: Soft, nontender without any shifting dullness or ascites. Musculoskeletal: No clubbing or cyanosis. Neurological: No motor or sensory deficits. Skin: No rashes or lesions.         Lab Results: Lab Results  Component Value Date   WBC 5.8 12/09/2021   HGB 12.2 (  L) 12/09/2021   HCT 34.0 (L) 12/09/2021   MCV 78.3 (L) 12/09/2021   PLT 148 (L) 12/09/2021     Chemistry      Component Value Date/Time   NA 135 12/09/2021 1350   NA 137 11/23/2021 1145   K 4.4 12/09/2021 1350   CL 102 12/09/2021 1350   CO2 26 12/09/2021 1350   BUN 48 (H) 12/09/2021 1350   BUN 48 (H) 11/23/2021 1145   CREATININE 2.58 (H) 12/09/2021 1350   GLU 98 07/10/2021 0000      Component Value Date/Time   CALCIUM 10.6 (H) 12/09/2021 1350   ALKPHOS 51 12/09/2021 1350   AST 41 12/09/2021 1350    ALT 20 12/09/2021 1350   BILITOT 0.9 12/09/2021 1350          Impression and Plan:  72 year old man with with:  1.  Right kidney cancer diagnosed in June 2023.  He was found to have T3a clear-cell without any evidence of metastatic disease.  He is currently on Pembrolizumab with few complaints.  He has reported dry mouth and lack of taste but no other major complications.  Risks and benefits of continuing this treatment including the overall survival benefit that has been documented after 1 year of adjuvant Pembrolizumab were discussed.  Therapy discontinuation was also entertained today given some of the bothersome side effects.  It is unclear how much benefit he would get after 6 months of treatment that is reasonable that he has some benefit.  After discussion today, he is agreeable to continuing this treatment and will be evaluated before any treatment at this time whether he is willing to continue or not.  Imaging studies will be continued into the care of [facility] and Dr. Lovena Cain.     2.  IV access: Peripheral veins are currently in use.   3.  Autoimmune complications: He has experienced predominantly dry mouth and altered taste.  No other complications including pneumonitis, colitis and hepatitis.  Continue to educate him about these issues.  4.  Congestive heart failure: No decompensation noted at this time.   5.  Follow-up: In 3 weeks for the next cycle of therapy.     30  minutes were spent on this encounter.  Time was dedicated to updating disease status, treatment choices and outlining future plan of care review.     Bryce Button, MD 1/3/202412:56 PM  His TSH continues to rise with low T4 indicating hypothyroidism.  Will start Synthroid therapy and monitor his thyroid panel regularly.

## 2022-01-06 NOTE — Patient Instructions (Signed)
[facility]  Discharge Instructions: Thank you for choosing [facility] to provide your oncology and hematology care.   If you have a lab appointment with the Cancer Center, please go directly to the Cancer Center and check in at the registration area.   Wear comfortable clothing and clothing appropriate for easy access to any Portacath or PICC line.   We strive to give you quality time with your provider. You may need to reschedule your appointment if you arrive late (15 or more minutes).  Arriving late affects you and other patients whose appointments are after yours.  Also, if you miss three or more appointments without notifying the office, you may be dismissed from the clinic at the provider's discretion.      For prescription refill requests, have your pharmacy contact our office and allow 72 hours for refills to be completed.    Today you received the following chemotherapy and/or immunotherapy agents keytruda      To help prevent nausea and vomiting after your treatment, we encourage you to take your nausea medication as directed.  BELOW ARE SYMPTOMS THAT SHOULD BE REPORTED IMMEDIATELY: *FEVER GREATER THAN 100.4 F (38 C) OR HIGHER *CHILLS OR SWEATING *NAUSEA AND VOMITING THAT IS NOT CONTROLLED WITH YOUR NAUSEA MEDICATION *UNUSUAL SHORTNESS OF BREATH *UNUSUAL BRUISING OR BLEEDING *URINARY PROBLEMS (pain or burning when urinating, or frequent urination) *BOWEL PROBLEMS (unusual diarrhea, constipation, pain near the anus) TENDERNESS IN MOUTH AND THROAT WITH OR WITHOUT PRESENCE OF ULCERS (sore throat, sores in mouth, or a toothache) UNUSUAL RASH, SWELLING OR PAIN  UNUSUAL VAGINAL DISCHARGE OR ITCHING   Items with * indicate a potential emergency and should be followed up as soon as possible or go to the Emergency Department if any problems should occur.  Please show the CHEMOTHERAPY ALERT CARD or IMMUNOTHERAPY ALERT CARD at check-in to  the Emergency Department and triage nurse.  Should you have questions after your visit or need to cancel or reschedule your appointment, please contact [facility]  Dept: [phone]  and follow the prompts.  Office hours are 8:00 a.m. to 4:30 p.m. Monday - Friday. Please note that voicemails left after 4:00 p.m. may not be returned until the following business day.  We are closed weekends and major holidays. You have access to a nurse at all times for urgent questions. Please call the main number to the clinic Dept: [phone] and follow the prompts.   For any non-urgent questions, you may also contact your provider using MyChart. We now offer e-Visits for anyone 18 and older to request care online for non-urgent symptoms. For details visit [email or web address].   Also download the MyChart app! Go to the app store, search "MyChart", open the app, select [facility], and log in with your MyChart username and password.   

## 2022-01-06 NOTE — Progress Notes (Signed)
Per Dr. Alen Blew, okay to treat today with SCr 3.19.

## 2022-01-06 NOTE — Progress Notes (Signed)
CRITICAL VALUE STICKER  CRITICAL VALUE: Cr 3.16  RECEIVER Sharlynn Oliphant, RN  MD NOTIFIED: Dr Alen Blew   TIME OF NOTIFICATION: [phone]

## 2022-01-07 ENCOUNTER — Other Ambulatory Visit: Payer: Self-pay | Admitting: Oncology

## 2022-01-07 MED ORDER — LEVOTHYROXINE SODIUM 50 MCG PO TABS: 50.0000 ug | ORAL_TABLET | Freq: Every day | ORAL | 1 refills | Status: DC

## 2022-01-07 NOTE — Progress Notes (Signed)
The results of his TSH and thyroid panel were reviewed and discussed with the patient today via phone.  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  I recommended starting Synthroid 50 mcg and follow his thyroid function closely.  Dose adjustment may be needed in the future.  All his questions were answered today to his satisfaction.

## 2022-01-07 NOTE — Addendum Note (Signed)
Addended by: Wyatt Portela on: 01/07/2022 09:10 AM   Modules accepted: Orders

## 2022-01-08 ENCOUNTER — Telehealth: Payer: Self-pay | Admitting: Internal Medicine

## 2022-01-08 NOTE — Telephone Encounter (Signed)
Called patient regarding upcoming January appointments, patient is notified. Patient is also aware of providers departure.

## 2022-01-13 DIAGNOSIS — G4733 Obstructive sleep apnea (adult) (pediatric): Secondary | ICD-10-CM | POA: Diagnosis not present

## 2022-01-19 ENCOUNTER — Telehealth: Payer: Self-pay | Admitting: Cardiology

## 2022-01-19 ENCOUNTER — Telehealth (HOSPITAL_COMMUNITY): Payer: Self-pay

## 2022-01-19 NOTE — Telephone Encounter (Signed)
error 

## 2022-01-19 NOTE — Telephone Encounter (Signed)
Pt states that he recently had a device put in. He says that on thi past Saturday he fell to his knees while in the shower due to dizziness and what felt like heaviness. Pt states that he was SOB at the time. Pt says he was told at last visit to call and make nurse aware if this was to happen. Pt would like a callback regarding this matter. Please advise

## 2022-01-19 NOTE — Telephone Encounter (Signed)
Spoke with pt who reiterates information as below.  Pt reports he took his BP after the event with reading of 153/83 and HR - 80. Requested pt send a device transmission for review.  Reviewed ED precautions with pt.  Pt verbalizes understanding and agrees with current plan.

## 2022-01-20 ENCOUNTER — Other Ambulatory Visit: Payer: Medicare PPO

## 2022-01-20 ENCOUNTER — Ambulatory Visit: Payer: Medicare PPO

## 2022-01-20 ENCOUNTER — Ambulatory Visit: Payer: Medicare PPO | Admitting: Physician Assistant

## 2022-01-20 NOTE — Telephone Encounter (Signed)
Normal device function no episodes noted.

## 2022-01-23 NOTE — Progress Notes (Signed)
[name]  Bryce Cain, Bryce Halsted, MD [place] [address]  DIAGNOSIS: Kidney cancer diagnosed in June 2023.  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   PRIOR THERAPY Robotic assisted laparoscopic right radical nephrectomy on June 23, 2021.  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   CURRENT THERAPY: Pembrolizumab 200 mg every 3 weeks started on August 19, 2021.  He is here for cycle 8 to complete one year in total 17 cycles.   INTERVAL HISTORY: Bryce Cain 72 y.o. male returns to the  clinic today for a follow-up visit.  The patient was last seen by Dr. Alen Cain on 01/06/22.  The patient will be transferring care to Dr. Julien Cain and myself.  The patient is currently being treated with immunotherapy for the patient's history of renal cell carcinoma.  The patient is status post 7 cycles and has been tolerating it fair except for dry mouth, taste alterations, and thyroid dysfunction. Of note, he is also on torsemide. He had CHF, last EF 20%. He sees Dr. Gilford Cain from urology. Today he denies any changes in his health.  Denies any fever, chills, night sweats, unexplained weight loss.  He reports good appetite.  Denies any chest pain, shortness of breath, cough, or hemoptysis.  Denies any new bone pain.  Denies any nausea, vomiting, diarrhea, or constipation.  Denies any dysuria, foul smelling urine, or blood in the urine.  He denies any prohibitive myalgias and arthralgias.  The patient was recently started on Synthroid for hypothyroidism secondary to his immunotherapy.  He is here today for evaluation and repeat blood work before undergoing cycle #8.   MEDICAL HISTORY: Past Medical History:  Diagnosis Date   CHF (congestive heart failure) (HCC)    DM (diabetes mellitus), type 2 (HCC)    OSA on CPAP      ALLERGIES:  has No Known Allergies.  MEDICATIONS:  Current Outpatient Medications  Medication Sig Dispense Refill   acetaminophen (TYLENOL) 325 MG tablet Take 1-2 tablets (325-650 mg total) by mouth every 4 (four) hours as needed for mild pain.     aspirin EC 81 MG tablet Take 1 tablet (81 mg total) by mouth daily. Swallow whole. 90 tablet 3   empagliflozin (JARDIANCE) 10 MG TABS tablet Take 1 tablet (10 mg total) by mouth daily. 90 tablet 3   isosorbide-hydrALAZINE (BIDIL) 20-37.5 MG tablet Take 1 tablet by mouth 3 (three) times daily. 90 tablet 6   levothyroxine (SYNTHROID) 50 MCG tablet TAKE 1 TABLET(50 MCG) BY MOUTH DAILY BEFORE BREAKFAST 90 tablet 1   Multiple Vitamin (MULTIVITAMIN) capsule Take 1 capsule by mouth daily.     nystatin (MYCOSTATIN) 100000 UNIT/ML suspension Take 5 mLs (500,000 Units total) by mouth 4 (four) times daily. 60 mL 0   Omega-3 Fatty Acids (FISH OIL) 1000 MG CAPS Take 1,000 mg by mouth daily.     Pembrolizumab (KEYTRUDA IV) Inject 1 Dose into the vein every 21 ( twenty-one) days.     potassium chloride (KLOR-CON M) 10 MEQ tablet TAKE 1 TABLET(10 MEQ) BY MOUTH DAILY 30 tablet 3   PREVIDENT 5000 ENAMEL PROTECT 1.1-5 % GEL Place 1 application. onto teeth at bedtime.     Probiotic Product (UP4 PROBIOTICS ADULT PO) Take 1 capsule by mouth daily.     rosuvastatin (CRESTOR) 40 MG tablet Take  1 tablet (40 mg total) by mouth daily. 90 tablet 3   torsemide (DEMADEX) 20 MG tablet Take 1 tablet (20 mg total) by mouth daily. 30 tablet 6   No current facility-administered medications for this visit.    SURGICAL HISTORY:  Past Surgical History:  Procedure Laterality Date   BIV ICD INSERTION CRT-D N/A 12/03/2021   Procedure: BIV ICD INSERTION CRT-D;  Surgeon: Vickie Epley, MD;  Location: [facility];  Service: Cardiovascular;  Laterality: N/A;   RIGHT/LEFT HEART CATH AND CORONARY ANGIOGRAPHY N/A 02/16/2021   Procedure: RIGHT/LEFT HEART CATH AND CORONARY  ANGIOGRAPHY;  Surgeon: Jolaine Artist, MD;  Location: [facility];  Service: Cardiovascular;  Laterality: N/A;   ROBOT ASSISTED LAPAROSCOPIC NEPHRECTOMY Right 06/23/2021   Procedure: XI ROBOTIC ASSISTED LAPAROSCOPIC NEPHRECTOMY;  Surgeon: Ceasar Mons, MD;  Location: [facility];  Service: Urology;  Laterality: Right;    REVIEW OF SYSTEMS:   Review of Systems  Constitutional: Negative for appetite change, chills, fatigue, fever and unexpected weight change.  HENT: Positive for dry mouth and taste alterations. Negative for mouth sores, nosebleeds, sore throat and trouble swallowing.   Eyes: Negative for eye problems and icterus.  Respiratory: Negative for cough, hemoptysis, shortness of breath and wheezing.   Cardiovascular: Negative for chest pain and leg swelling.  Gastrointestinal: Negative for abdominal pain, constipation, diarrhea, nausea and vomiting.  Genitourinary: Negative for bladder incontinence, difficulty urinating, dysuria, frequency and hematuria.   Musculoskeletal: Negative for back pain, gait problem, neck pain and neck stiffness.  Skin: Negative for itching and rash.  Neurological: Negative for dizziness, extremity weakness, gait problem, headaches, light-headedness and seizures.  Hematological: Negative for adenopathy. Does not bruise/bleed easily.  Psychiatric/Behavioral: Negative for confusion, depression and sleep disturbance. The patient is not nervous/anxious.     PHYSICAL EXAMINATION:  Blood pressure 103/63, pulse 74, temperature 97.8 F (36.6 C), temperature source Oral, resp. rate 16, weight 229 lb 4.8 oz (104 kg), SpO2 98 %.  ECOG PERFORMANCE STATUS: 1  Physical Exam  Constitutional: Oriented to person, place, and time and well-developed, well-nourished, and in no distress.  HENT:  Head: Normocephalic and atraumatic.  Mouth/Throat: Oropharynx has some mild thrush. Dry appearing. No oropharyngeal exudate.  Eyes: Conjunctivae are normal.  Right eye exhibits no discharge. Left eye exhibits no discharge. No scleral icterus.  Neck: Normal range of motion. Neck supple.  Cardiovascular: Normal rate, regular rhythm, normal heart sounds and intact distal pulses.   Pulmonary/Chest: Effort normal and breath sounds normal. No respiratory distress. No wheezes. No rales.  Abdominal: Soft. Bowel sounds are normal. Exhibits no distension and no mass. There is no tenderness.  Musculoskeletal: Normal range of motion. Exhibits no edema.  Lymphadenopathy:    No cervical adenopathy.  Neurological: Alert and oriented to person, place, and time. Exhibits normal muscle tone. Gait normal. Coordination normal.  Skin: Skin is warm and dry. No rash noted. Not diaphoretic. No erythema. No pallor.  Psychiatric: Mood, memory and judgment normal.  Vitals reviewed.  LABORATORY DATA: Lab Results  Component Value Date   WBC 4.8 01/26/2022   HGB 11.0 (L) 01/26/2022   HCT 29.7 (L) 01/26/2022   MCV 78.6 (L) 01/26/2022   PLT 133 (L) 01/26/2022      Chemistry      Component Value Date/Time   NA 133 (L) 01/26/2022 1429   NA 137 11/23/2021 1145   K 4.3 01/26/2022 1429   CL 103 01/26/2022 1429   CO2 23 01/26/2022 1429  BUN 59 (H) 01/26/2022 1429   BUN 48 (H) 11/23/2021 1145   CREATININE 3.35 (HH) 01/26/2022 1429   GLU 98 07/10/2021 0000      Component Value Date/Time   CALCIUM 10.3 01/26/2022 1429   ALKPHOS 43 01/26/2022 1429   AST 38 01/26/2022 1429   ALT 17 01/26/2022 1429   BILITOT 0.7 01/26/2022 1429       RADIOGRAPHIC STUDIES:  No results found.   ASSESSMENT/PLAN:  This is a very pleasant 72 year old African American male diagnosed with right sided renal cell carcinoma in June 2023.  He was found to have a T3a clear-cell without any evidence of metastatic disease.  He underwent robotic assisted laparoscopic right radical nephrectomy on 06/23/21.  He is currently on Keytruda 200 mg IV every 3 weeks.  He is tolerating this well.  He  is status post 7 cycles.  The plan is to complete 1 year of treatment which will be 17 cycles.  The patient was seen with Dr. Julien Cain today.  Labs were reviewed.  Recommend that he proceed with cycle #8 today's schedule.  The patient has CKD.  Okay to treat with a creatinine of 3.35 today. We will give only give him 1/2 L of fluid today due to his CHF and low EF. Dr. Julien Cain instructed the patient to hydrate.   He will continue taking Synthroid for his hypothyroidism.  We will see him back for follow-up visit in 3 weeks for evaluation repeat blood work before undergoing cycle #9.  He has some thrush on exam. I sent in Rx for nystatin. He was also encouraged to do salt water rinses, biotene, and make sure he brushes his tongue. Discussed OTC products for dry mouth such as salivasure.   The patient was advised to call immediately if she has any concerning symptoms in the interval. The patient voices understanding of current disease status and treatment options and is in agreement with the current care plan. All questions were answered. The patient knows to call the clinic with any problems, questions or concerns. We can certainly see the patient much sooner if necessary   No orders of the defined types were placed in this encounter.     Norlan Rann L Seletha Zimmermann, PA-C 01/26/22  ADDENDUM: Hematology/Oncology Attending: I had a face-to-face encounter with the patient today.  I reviewed his record, labs and recommended his care plan.  This is a very pleasant 72 years old African-American male who came to the clinic today to establish care with me after Dr. Alen Cain left the practice.  The patient has a history of stage III (T38, N0, M0) clear-cell nuclear grade 4 renal cell carcinoma status post laparoscopic right radical nephrectomy on June 23, 2021 and his tumor size was 8.5 cm.  The patient started by Dr. Alen Cain on adjuvant immunotherapy with Keytruda 200 Mg IV every 3 weeks.  First dose was  August 19, 2021.  He is status post 8 cycles of this treatment. The patient has been tolerating this treatment well with no concerning adverse effects.  He continues to have renal insufficiency and dryness of his mouth as well as poor ejection fraction with congestive heart failure. He is here today for evaluation before starting cycle #9.  The patient is feeling fine except for the dryness. I recommended for him to proceed with cycle #9 today as planned. For the dehydration, will give the patient 500 cc of normal saline in the clinic today. We advised him to follow-up with his cardiologist  as well as his nephrologist for the congestive heart failure and renal insufficiency. The patient will come back for follow-up visit in 3 weeks for evaluation before the next cycle of his treatment. He was advised to call immediately if he has any other concerning symptoms in the interval. The total time spent in the appointment was 30 minutes. Disclaimer: This note was dictated with voice recognition software. Similar sounding words can inadvertently be transcribed and may be missed upon review. Eilleen Kempf, MD

## 2022-01-26 ENCOUNTER — Other Ambulatory Visit: Payer: Self-pay

## 2022-01-26 ENCOUNTER — Inpatient Hospital Stay: Payer: Medicare PPO

## 2022-01-26 ENCOUNTER — Ambulatory Visit: Payer: Medicare PPO | Admitting: Internal Medicine

## 2022-01-26 ENCOUNTER — Ambulatory Visit: Payer: Medicare PPO

## 2022-01-26 ENCOUNTER — Other Ambulatory Visit: Payer: Medicare PPO

## 2022-01-26 ENCOUNTER — Inpatient Hospital Stay: Payer: Medicare PPO | Admitting: Physician Assistant

## 2022-01-26 VITALS — BP 103/63 | HR 74 | Temp 97.8°F | Resp 16 | Wt 229.3 lb

## 2022-01-26 VITALS — BP 112/58 | HR 65 | Resp 17

## 2022-01-26 DIAGNOSIS — E1122 Type 2 diabetes mellitus with diabetic chronic kidney disease: Secondary | ICD-10-CM | POA: Diagnosis not present

## 2022-01-26 DIAGNOSIS — C641 Malignant neoplasm of right kidney, except renal pelvis: Secondary | ICD-10-CM | POA: Diagnosis not present

## 2022-01-26 DIAGNOSIS — B370 Candidal stomatitis: Secondary | ICD-10-CM

## 2022-01-26 DIAGNOSIS — E039 Hypothyroidism, unspecified: Secondary | ICD-10-CM | POA: Diagnosis not present

## 2022-01-26 DIAGNOSIS — Z905 Acquired absence of kidney: Secondary | ICD-10-CM | POA: Diagnosis not present

## 2022-01-26 DIAGNOSIS — N189 Chronic kidney disease, unspecified: Secondary | ICD-10-CM | POA: Diagnosis not present

## 2022-01-26 DIAGNOSIS — Z79899 Other long term (current) drug therapy: Secondary | ICD-10-CM | POA: Diagnosis not present

## 2022-01-26 DIAGNOSIS — Z7982 Long term (current) use of aspirin: Secondary | ICD-10-CM | POA: Diagnosis not present

## 2022-01-26 DIAGNOSIS — Z5112 Encounter for antineoplastic immunotherapy: Secondary | ICD-10-CM | POA: Diagnosis not present

## 2022-01-26 DIAGNOSIS — Z7984 Long term (current) use of oral hypoglycemic drugs: Secondary | ICD-10-CM | POA: Diagnosis not present

## 2022-01-26 LAB — CMP (CANCER CENTER ONLY)
ALT: 17 U/L (ref 0–44)
AST: 38 U/L (ref 15–41)
Albumin: 4.1 g/dL (ref 3.5–5.0)
Alkaline Phosphatase: 43 U/L (ref 38–126)
Anion gap: 7 (ref 5–15)
BUN: 59 mg/dL — ABNORMAL HIGH (ref 8–23)
CO2: 23 mmol/L (ref 22–32)
Calcium: 10.3 mg/dL (ref 8.9–10.3)
Chloride: 103 mmol/L (ref 98–111)
Creatinine: 3.35 mg/dL (ref 0.61–1.24)
GFR, Estimated: 19 mL/min — ABNORMAL LOW (ref >60)
Glucose, Bld: 84 mg/dL (ref 70–99)
Potassium: 4.3 mmol/L (ref 3.5–5.1)
Sodium: 133 mmol/L — ABNORMAL LOW (ref 135–145)
Total Bilirubin: 0.7 mg/dL (ref 0.3–1.2)
Total Protein: 7.3 g/dL (ref 6.5–8.1)

## 2022-01-26 LAB — CBC WITH DIFFERENTIAL (CANCER CENTER ONLY)
Abs Immature Granulocytes: 0.01 10*3/uL (ref 0.00–0.07)
Basophils Absolute: 0 10*3/uL (ref 0.0–0.1)
Basophils Relative: 1 %
Eosinophils Absolute: 0.2 10*3/uL (ref 0.0–0.5)
Eosinophils Relative: 5 %
HCT: 29.7 % — ABNORMAL LOW (ref 39.0–52.0)
Hemoglobin: 11 g/dL — ABNORMAL LOW (ref 13.0–17.0)
Immature Granulocytes: 0 %
Lymphocytes Relative: 16 %
Lymphs Abs: 0.8 10*3/uL (ref 0.7–4.0)
MCH: 29.1 pg (ref 26.0–34.0)
MCHC: 37 g/dL — ABNORMAL HIGH (ref 30.0–36.0)
MCV: 78.6 fL — ABNORMAL LOW (ref 80.0–100.0)
Monocytes Absolute: 0.9 10*3/uL (ref 0.1–1.0)
Monocytes Relative: 18 %
Neutro Abs: 2.9 10*3/uL (ref 1.7–7.7)
Neutrophils Relative %: 60 %
Platelet Count: 133 10*3/uL — ABNORMAL LOW (ref 150–400)
RBC: 3.78 MIL/uL — ABNORMAL LOW (ref 4.22–5.81)
RDW: 16.3 % — ABNORMAL HIGH (ref 11.5–15.5)
WBC Count: 4.8 10*3/uL (ref 4.0–10.5)
nRBC: 0 % (ref 0.0–0.2)

## 2022-01-26 LAB — TSH: TSH: 124.07 u[IU]/mL — ABNORMAL HIGH (ref 0.350–4.500)

## 2022-01-26 MED ORDER — SODIUM CHLORIDE 0.9 % IV SOLN
200.0000 mg | Freq: Once | INTRAVENOUS | Status: AC
  Administered 2022-01-26: 200 mg via INTRAVENOUS
  Filled 2022-01-26: qty 200

## 2022-01-26 MED ORDER — SODIUM CHLORIDE 0.9 % IV SOLN: Freq: Once | INTRAVENOUS | Status: AC

## 2022-01-26 MED ORDER — NYSTATIN 100000 UNIT/ML MT SUSP: 5.0000 mL | Freq: Four times a day (QID) | OROMUCOSAL | 0 refills | Status: DC

## 2022-02-08 ENCOUNTER — Other Ambulatory Visit: Payer: Self-pay | Admitting: Oncology

## 2022-02-09 ENCOUNTER — Telehealth: Payer: Self-pay | Admitting: Internal Medicine

## 2022-02-09 NOTE — Telephone Encounter (Signed)
Called patient regarding upcoming February - April appointments, left a voicemail.

## 2022-02-10 ENCOUNTER — Ambulatory Visit: Payer: Medicare PPO

## 2022-02-10 ENCOUNTER — Ambulatory Visit: Payer: Medicare PPO | Admitting: Internal Medicine

## 2022-02-10 ENCOUNTER — Other Ambulatory Visit: Payer: Medicare PPO

## 2022-02-13 DIAGNOSIS — G4733 Obstructive sleep apnea (adult) (pediatric): Secondary | ICD-10-CM | POA: Diagnosis not present

## 2022-02-13 NOTE — Progress Notes (Deleted)
[facility] OFFICE PROGRESS NOTE  Bryce Cain, [name], MD [place] [address]  DIAGNOSIS: Kidney cancer diagnosed in June 2023.  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    PRIOR THERAPY: Robotic assisted laparoscopic right radical nephrectomy on June 23, 2021.  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    CURRENT THERAPY:  Pembrolizumab 200 mg every 3 weeks started on August 19, 2021.  He is here for cycle 9 to complete one year in total 17 cycles.     INTERVAL HISTORY: Bryce Cain 72 y.o. male returns to the  clinic today for a follow-up visit.  The patient was last seen by Dr. Alen Blew on 01/06/22.  The patient will be transferring care to Dr. Julien Nordmann and myself.  The patient is currently being treated with immunotherapy for the patient's history of renal cell carcinoma.  The patient is status post 7 cycles and has been tolerating it fair except for dry mouth, taste alterations, and thyroid dysfunction. At his last appointment we recommended biotene, salt water rinses, and OTC saliva substitutes. Of note, he is also on torsemide. He had CHF, last EF 20%. He sees Dr. Gilford Rile from urology. Today he denies any changes in his health.  Denies any fever, chills, night sweats, unexplained weight loss.  He reports good appetite.  Denies any chest pain, shortness of breath, cough, or hemoptysis.  Denies any new bone pain.  Denies any nausea, vomiting, diarrhea, or constipation.  Denies any dysuria, foul smelling urine, or blood in the urine.  He denies any prohibitive myalgias and arthralgias.  The patient was recently started on Synthroid for hypothyroidism secondary to his immunotherapy.  He is here today for evaluation and repeat blood work before undergoing cycle #9.   MEDICAL HISTORY: Past Medical History:  Diagnosis  Date   CHF (congestive heart failure) (HCC)    DM (diabetes mellitus), type 2 (HCC)    OSA on CPAP     ALLERGIES:  has No Known Allergies.  MEDICATIONS:  Current Outpatient Medications  Medication Sig Dispense Refill   acetaminophen (TYLENOL) 325 MG tablet Take 1-2 tablets (325-650 mg total) by mouth every 4 (four) hours as needed for mild pain.     aspirin EC 81 MG tablet Take 1 tablet (81 mg total) by mouth daily. Swallow whole. 90 tablet 3   empagliflozin (JARDIANCE) 10 MG TABS tablet Take 1 tablet (10 mg total) by mouth daily. 90 tablet 3   isosorbide-hydrALAZINE (BIDIL) 20-37.5 MG tablet Take 1 tablet by mouth 3 (three) times daily. 90 tablet 6   levothyroxine (SYNTHROID) 50 MCG tablet TAKE 1 TABLET(50 MCG) BY MOUTH DAILY BEFORE BREAKFAST 90 tablet 1   Multiple Vitamin (MULTIVITAMIN) capsule Take 1 capsule by mouth daily.     nystatin (MYCOSTATIN) 100000 UNIT/ML suspension Take 5 mLs (500,000 Units total) by mouth 4 (four) times daily. 60 mL 0   Omega-3 Fatty Acids (FISH OIL) 1000 MG CAPS Take 1,000 mg by mouth daily.     Pembrolizumab (KEYTRUDA IV) Inject 1 Dose into the vein every 21 ( twenty-one) days.     potassium chloride (KLOR-CON M) 10 MEQ tablet TAKE 1 TABLET(10 MEQ) BY MOUTH DAILY 30 tablet 3   PREVIDENT 5000 ENAMEL PROTECT 1.1-5 % GEL Place 1 application. onto teeth at bedtime.     Probiotic Product (UP4  PROBIOTICS ADULT PO) Take 1 capsule by mouth daily.     rosuvastatin (CRESTOR) 40 MG tablet Take 1 tablet (40 mg total) by mouth daily. 90 tablet 3   torsemide (DEMADEX) 20 MG tablet Take 1 tablet (20 mg total) by mouth daily. 30 tablet 6   No current facility-administered medications for this visit.    SURGICAL HISTORY:  Past Surgical History:  Procedure Laterality Date   BIV ICD INSERTION CRT-D N/A 12/03/2021   Procedure: BIV ICD INSERTION CRT-D;  Surgeon: Vickie Epley, MD;  Location: [facility];  Service: Cardiovascular;  Laterality: N/A;    RIGHT/LEFT HEART CATH AND CORONARY ANGIOGRAPHY N/A 02/16/2021   Procedure: RIGHT/LEFT HEART CATH AND CORONARY ANGIOGRAPHY;  Surgeon: Jolaine Artist, MD;  Location: [facility];  Service: Cardiovascular;  Laterality: N/A;   ROBOT ASSISTED LAPAROSCOPIC NEPHRECTOMY Right 06/23/2021   Procedure: XI ROBOTIC ASSISTED LAPAROSCOPIC NEPHRECTOMY;  Surgeon: Ceasar Mons, MD;  Location: [facility];  Service: Urology;  Laterality: Right;    REVIEW OF SYSTEMS:   Review of Systems  Constitutional: Negative for appetite change, chills, fatigue, fever and unexpected weight change.  HENT:   Negative for mouth sores, nosebleeds, sore throat and trouble swallowing.   Eyes: Negative for eye problems and icterus.  Respiratory: Negative for cough, hemoptysis, shortness of breath and wheezing.   Cardiovascular: Negative for chest pain and leg swelling.  Gastrointestinal: Negative for abdominal pain, constipation, diarrhea, nausea and vomiting.  Genitourinary: Negative for bladder incontinence, difficulty urinating, dysuria, frequency and hematuria.   Musculoskeletal: Negative for back pain, gait problem, neck pain and neck stiffness.  Skin: Negative for itching and rash.  Neurological: Negative for dizziness, extremity weakness, gait problem, headaches, light-headedness and seizures.  Hematological: Negative for adenopathy. Does not bruise/bleed easily.  Psychiatric/Behavioral: Negative for confusion, depression and sleep disturbance. The patient is not nervous/anxious.     PHYSICAL EXAMINATION:  There were no vitals taken for this visit.  ECOG PERFORMANCE STATUS: {CHL ONC ECOG [id number]  Physical Exam  Constitutional: Oriented to person, place, and time and well-developed, well-nourished, and in no distress. No distress.  HENT:  Head: Normocephalic and atraumatic.  Mouth/Throat: Oropharynx is clear and moist. No oropharyngeal exudate.  Eyes: Conjunctivae are normal. Right eye  exhibits no discharge. Left eye exhibits no discharge. No scleral icterus.  Neck: Normal range of motion. Neck supple.  Cardiovascular: Normal rate, regular rhythm, normal heart sounds and intact distal pulses.   Pulmonary/Chest: Effort normal and breath sounds normal. No respiratory distress. No wheezes. No rales.  Abdominal: Soft. Bowel sounds are normal. Exhibits no distension and no mass. There is no tenderness.  Musculoskeletal: Normal range of motion. Exhibits no edema.  Lymphadenopathy:    No cervical adenopathy.  Neurological: Alert and oriented to person, place, and time. Exhibits normal muscle tone. Gait normal. Coordination normal.  Skin: Skin is warm and dry. No rash noted. Not diaphoretic. No erythema. No pallor.  Psychiatric: Mood, memory and judgment normal.  Vitals reviewed.  LABORATORY DATA: Lab Results  Component Value Date   WBC 4.8 01/26/2022   HGB 11.0 (L) 01/26/2022   HCT 29.7 (L) 01/26/2022   MCV 78.6 (L) 01/26/2022   PLT 133 (L) 01/26/2022      Chemistry      Component Value Date/Time   NA 133 (L) 01/26/2022 1429   NA 137 11/23/2021 1145   K 4.3 01/26/2022 1429   CL 103 01/26/2022 1429   CO2 23 01/26/2022 1429  BUN 59 (H) 01/26/2022 1429   BUN 48 (H) 11/23/2021 1145   CREATININE 3.35 (HH) 01/26/2022 1429   GLU 98 07/10/2021 0000      Component Value Date/Time   CALCIUM 10.3 01/26/2022 1429   ALKPHOS 43 01/26/2022 1429   AST 38 01/26/2022 1429   ALT 17 01/26/2022 1429   BILITOT 0.7 01/26/2022 1429       RADIOGRAPHIC STUDIES:  No results found.   ASSESSMENT/PLAN:  This is a very pleasant 72 year old African American male diagnosed with right sided renal cell carcinoma in June 2023.  He was found to have a T3a clear-cell without any evidence of metastatic disease.   He underwent robotic assisted laparoscopic right radical nephrectomy on 06/23/21.   He is currently on Keytruda 200 mg IV every 3 weeks.  He is tolerating this well.  He is  status post 8 cycles.  The plan is to complete 1 year of treatment which will be 17 cycles.   The patient was seen with Dr. Julien Nordmann today.  Labs were reviewed.  Recommend that he proceed with cycle #9 today's schedule.  The patient has CKD.  Okay to treat with a creatinine of *** today. We will give only give him 1/2 L of fluid today due to his CHF and low EF***. Dr. Julien Nordmann instructed the patient to hydrate.    He will continue taking Synthroid for his hypothyroidism.   We will see him back for follow-up visit in 3 weeks for evaluation repeat blood work before undergoing cycle #10.   For his taste alterations, He was also encouraged to do salt water rinses, biotene, and make sure he brushes his tongue. Discussed OTC products for dry mouth such as salivasure.      No orders of the defined types were placed in this encounter.    I spent {CHL ONC TIME VISIT - [id number] counseling the patient face to face. The total time spent in the appointment was {CHL ONC TIME VISIT - [id number].  Jenavi Beedle L Lelania Bia, PA-C 02/13/22

## 2022-02-16 ENCOUNTER — Inpatient Hospital Stay: Payer: Medicare PPO | Attending: Internal Medicine

## 2022-02-16 ENCOUNTER — Inpatient Hospital Stay: Payer: Medicare PPO

## 2022-02-16 ENCOUNTER — Inpatient Hospital Stay: Payer: Medicare PPO | Admitting: Physician Assistant

## 2022-02-16 DIAGNOSIS — Z79899 Other long term (current) drug therapy: Secondary | ICD-10-CM | POA: Insufficient documentation

## 2022-02-16 DIAGNOSIS — Z905 Acquired absence of kidney: Secondary | ICD-10-CM | POA: Insufficient documentation

## 2022-02-16 DIAGNOSIS — Z7982 Long term (current) use of aspirin: Secondary | ICD-10-CM | POA: Insufficient documentation

## 2022-02-16 DIAGNOSIS — N189 Chronic kidney disease, unspecified: Secondary | ICD-10-CM | POA: Insufficient documentation

## 2022-02-16 DIAGNOSIS — E039 Hypothyroidism, unspecified: Secondary | ICD-10-CM | POA: Insufficient documentation

## 2022-02-16 DIAGNOSIS — E1122 Type 2 diabetes mellitus with diabetic chronic kidney disease: Secondary | ICD-10-CM | POA: Insufficient documentation

## 2022-02-16 DIAGNOSIS — Z7984 Long term (current) use of oral hypoglycemic drugs: Secondary | ICD-10-CM | POA: Insufficient documentation

## 2022-02-16 DIAGNOSIS — C641 Malignant neoplasm of right kidney, except renal pelvis: Secondary | ICD-10-CM | POA: Insufficient documentation

## 2022-02-16 DIAGNOSIS — Z5112 Encounter for antineoplastic immunotherapy: Secondary | ICD-10-CM | POA: Insufficient documentation

## 2022-02-18 NOTE — Progress Notes (Signed)
[facility] OFFICE PROGRESS NOTE  Bryce Cain, Bryce Halsted, MD [place] [address]  DIAGNOSIS: Kidney cancer diagnosed in June 2023.  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    PRIOR THERAPY: Robotic assisted laparoscopic right radical nephrectomy on June 23, 2021.  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    CURRENT THERAPY: Pembrolizumab 200 mg every 3 weeks started on August 19, 2021.  He is here for cycle 9 to complete one year in total 17 cycles.   INTERVAL HISTORY: Bryce Cain 72 y.o. male returns to the clinic today for a follow-up visit.  The patient is currently being treated with immunotherapy for the patient's history of renal cell carcinoma.  The patient is status post 7 cycles and has been tolerating it fair except for dry mouth, taste alterations, and thyroid dysfunction. At his last appointment we recommended biotene, salt water rinses, and OTC saliva substitutes. Of note, he is also on torsemide. He had CHF, last EF 20%. He sees Dr. Gilford Rile from urology. He had not picked up the saliva substitute or the nystatin. Today he denies any changes in his health.  Denies any fever, chills, night sweats, unexplained weight loss.  He reports good appetite.  Denies any chest pain, shortness of breath, cough, or hemoptysis.  Denies any new bone pain.  Denies any nausea, vomiting, diarrhea, or constipation.  Denies any dysuria, foul smelling urine, or blood in the urine.  He denies any prohibitive myalgias and arthralgias.  The patient was recently started on Synthroid for hypothyroidism secondary to his immunotherapy.  He is here today for evaluation and repeat blood work before undergoing cycle #9.    MEDICAL HISTORY: Past Medical History:  Diagnosis Date   CHF (congestive heart failure) (HCC)    DM (diabetes  mellitus), type 2 (HCC)    OSA on CPAP     ALLERGIES:  has No Known Allergies.  MEDICATIONS:  Current Outpatient Medications  Medication Sig Dispense Refill   acetaminophen (TYLENOL) 325 MG tablet Take 1-2 tablets (325-650 mg total) by mouth every 4 (four) hours as needed for mild pain.     aspirin EC 81 MG tablet Take 1 tablet (81 mg total) by mouth daily. Swallow whole. 90 tablet 3   empagliflozin (JARDIANCE) 10 MG TABS tablet Take 1 tablet (10 mg total) by mouth daily. 90 tablet 3   isosorbide-hydrALAZINE (BIDIL) 20-37.5 MG tablet Take 1 tablet by mouth 3 (three) times daily. 90 tablet 6   levothyroxine (SYNTHROID) 50 MCG tablet TAKE 1 TABLET(50 MCG) BY MOUTH DAILY BEFORE BREAKFAST 90 tablet 1   Multiple Vitamin (MULTIVITAMIN) capsule Take 1 capsule by mouth daily.     nystatin (MYCOSTATIN) 100000 UNIT/ML suspension Take 5 mLs (500,000 Units total) by mouth 4 (four) times daily. 60 mL 0   Omega-3 Fatty Acids (FISH OIL) 1000 MG CAPS Take 1,000 mg by mouth daily.     Pembrolizumab (KEYTRUDA IV) Inject 1 Dose into the vein every 21 ( twenty-one) days.     potassium chloride (KLOR-CON M) 10 MEQ tablet TAKE 1 TABLET(10 MEQ) BY MOUTH DAILY 30 tablet 3   PREVIDENT 5000 ENAMEL PROTECT 1.1-5 % GEL Place 1 application. onto teeth at bedtime.     Probiotic Product (UP4 PROBIOTICS ADULT PO) Take 1 capsule by mouth daily.     rosuvastatin (CRESTOR)  40 MG tablet Take 1 tablet (40 mg total) by mouth daily. 90 tablet 3   torsemide (DEMADEX) 20 MG tablet Take 1 tablet (20 mg total) by mouth daily. 30 tablet 6   No current facility-administered medications for this visit.    SURGICAL HISTORY:  Past Surgical History:  Procedure Laterality Date   BIV ICD INSERTION CRT-D N/A 12/03/2021   Procedure: BIV ICD INSERTION CRT-D;  Surgeon: Vickie Epley, MD;  Location: [facility];  Service: Cardiovascular;  Laterality: N/A;   RIGHT/LEFT HEART CATH AND CORONARY ANGIOGRAPHY N/A 02/16/2021    Procedure: RIGHT/LEFT HEART CATH AND CORONARY ANGIOGRAPHY;  Surgeon: Jolaine Artist, MD;  Location: [facility];  Service: Cardiovascular;  Laterality: N/A;   ROBOT ASSISTED LAPAROSCOPIC NEPHRECTOMY Right 06/23/2021   Procedure: XI ROBOTIC ASSISTED LAPAROSCOPIC NEPHRECTOMY;  Surgeon: Ceasar Mons, MD;  Location: [facility];  Service: Urology;  Laterality: Right;    REVIEW OF SYSTEMS:   Constitutional: Negative for appetite change, chills, fatigue, fever and unexpected weight change.  HENT: Positive for dry mouth and taste alterations. Negative for mouth sores, nosebleeds, sore throat and trouble swallowing.   Eyes: Negative for eye problems and icterus.  Respiratory: Negative for cough, hemoptysis, shortness of breath and wheezing.   Cardiovascular: Negative for chest pain and leg swelling.  Gastrointestinal: Negative for abdominal pain, constipation, diarrhea, nausea and vomiting.  Genitourinary: Negative for bladder incontinence, difficulty urinating, dysuria, frequency and hematuria.   Musculoskeletal: Negative for back pain, gait problem, neck pain and neck stiffness.  Skin: Negative for itching and rash.  Neurological: Negative for dizziness, extremity weakness, gait problem, headaches, light-headedness and seizures.  Hematological: Negative for adenopathy. Does not bruise/bleed easily.  Psychiatric/Behavioral: Negative for confusion, depression and sleep disturbance. The patient is not nervous/anxious.        PHYSICAL EXAMINATION:  Blood pressure 92/62, pulse 73, temperature 98.1 F (36.7 C), temperature source Oral, resp. rate 14, weight 228 lb 8 oz (103.6 kg), SpO2 97 %.  ECOG PERFORMANCE STATUS: 1  Physical Exam  Constitutional: Oriented to person, place, and time and well-developed, well-nourished, and in no distress.  HENT:  Head: Normocephalic and atraumatic.  Mouth/Throat: Oropharynx has some thrush. Dry appearing. No oropharyngeal exudate.  Eyes:  Conjunctivae are normal. Right eye exhibits no discharge. Left eye exhibits no discharge. No scleral icterus.  Neck: Normal range of motion. Neck supple.  Cardiovascular: Normal rate, regular rhythm, normal heart sounds and intact distal pulses.   Pulmonary/Chest: Effort normal and breath sounds normal. No respiratory distress. No wheezes. No rales.  Abdominal: Soft. Bowel sounds are normal. Exhibits no distension and no mass. There is no tenderness.  Musculoskeletal: Normal range of motion. Exhibits no edema.  Lymphadenopathy:    No cervical adenopathy.  Neurological: Alert and oriented to person, place, and time. Exhibits normal muscle tone. Gait normal. Coordination normal.  Skin: Skin is warm and dry. No rash noted. Not diaphoretic. No erythema. No pallor.  Psychiatric: Mood, memory and judgment normal.  Vitals reviewed.  LABORATORY DATA: Lab Results  Component Value Date   WBC 5.0 02/22/2022   HGB 11.6 (L) 02/22/2022   HCT 31.6 (L) 02/22/2022   MCV 80.6 02/22/2022   PLT 159 02/22/2022      Chemistry      Component Value Date/Time   NA 134 (L) 02/22/2022 1018   NA 137 11/23/2021 1145   K 4.2 02/22/2022 1018   CL 102 02/22/2022 1018   CO2 23 02/22/2022 1018  BUN 61 (H) 02/22/2022 1018   BUN 48 (H) 11/23/2021 1145   CREATININE 3.08 (H) 02/22/2022 1018   GLU 98 07/10/2021 0000      Component Value Date/Time   CALCIUM 10.5 (H) 02/22/2022 1018   ALKPHOS 34 (L) 02/22/2022 1018   AST 32 02/22/2022 1018   ALT 15 02/22/2022 1018   BILITOT 0.7 02/22/2022 1018       RADIOGRAPHIC STUDIES:  No results found.   ASSESSMENT/PLAN:  This is a very pleasant 72 year old African American male diagnosed with right sided renal cell carcinoma in June 2023.  He was found to have a T3a clear-cell without any evidence of metastatic disease.   He underwent robotic assisted laparoscopic right radical nephrectomy on 06/23/21.   He is currently on Keytruda 200 mg IV every 3 weeks.  He is  tolerating this well.  He is status post 8 cycles.  The plan is to complete 1 year of treatment which will be 17 cycles.    Labs were reviewed.  Recommend that he proceed with cycle #9 today's schedule.  The patient has CKD.  Okay to treat with a creatinine of 3.08 today. He is hypotensive today with a BP of 96/57. We will give only give him 1/2 L of fluid over 2 hours today due to his CHF and low EF.    He will continue taking Synthroid for his hypothyroidism.   We will see him back for follow-up visit in 3 weeks for evaluation repeat blood work before undergoing cycle #10.   For his taste alterations, He was also encouraged to do salt water rinses, biotene, and make sure he brushes his tongue. Discussed OTC products for dry mouth such as salivasure or other saliva substitutes. I also recommended he pick up nystatin.   Of note, the patient has trouble with morning appointments. I will ask scheduling to make sure his appointments are in the afternoon moving forward.   The patient was advised to call immediately if he has any concerning symptoms in the interval. The patient voices understanding of current disease status and treatment options and is in agreement with the current care plan. All questions were answered. The patient knows to call the clinic with any problems, questions or concerns. We can certainly see the patient much sooner if necessary   No orders of the defined types were placed in this encounter.    The total time spent in the appointment was 20-29 minutes.   Mitra Duling L Guillermo Nehring, PA-C 02/22/22

## 2022-02-22 ENCOUNTER — Other Ambulatory Visit: Payer: Self-pay | Admitting: Internal Medicine

## 2022-02-22 ENCOUNTER — Other Ambulatory Visit: Payer: Self-pay

## 2022-02-22 ENCOUNTER — Inpatient Hospital Stay (HOSPITAL_BASED_OUTPATIENT_CLINIC_OR_DEPARTMENT_OTHER): Payer: Medicare PPO | Admitting: Physician Assistant

## 2022-02-22 ENCOUNTER — Inpatient Hospital Stay: Payer: Medicare PPO

## 2022-02-22 VITALS — BP 92/62 | HR 73 | Temp 98.1°F | Resp 14 | Wt 228.5 lb

## 2022-02-22 DIAGNOSIS — Z79899 Other long term (current) drug therapy: Secondary | ICD-10-CM | POA: Diagnosis not present

## 2022-02-22 DIAGNOSIS — Z5112 Encounter for antineoplastic immunotherapy: Secondary | ICD-10-CM | POA: Diagnosis not present

## 2022-02-22 DIAGNOSIS — C641 Malignant neoplasm of right kidney, except renal pelvis: Secondary | ICD-10-CM

## 2022-02-22 DIAGNOSIS — E039 Hypothyroidism, unspecified: Secondary | ICD-10-CM | POA: Diagnosis not present

## 2022-02-22 DIAGNOSIS — Z7982 Long term (current) use of aspirin: Secondary | ICD-10-CM | POA: Diagnosis not present

## 2022-02-22 DIAGNOSIS — Z905 Acquired absence of kidney: Secondary | ICD-10-CM | POA: Diagnosis not present

## 2022-02-22 DIAGNOSIS — E1122 Type 2 diabetes mellitus with diabetic chronic kidney disease: Secondary | ICD-10-CM | POA: Diagnosis not present

## 2022-02-22 DIAGNOSIS — N189 Chronic kidney disease, unspecified: Secondary | ICD-10-CM | POA: Diagnosis not present

## 2022-02-22 DIAGNOSIS — Z7984 Long term (current) use of oral hypoglycemic drugs: Secondary | ICD-10-CM | POA: Diagnosis not present

## 2022-02-22 LAB — CBC WITH DIFFERENTIAL (CANCER CENTER ONLY)
Abs Immature Granulocytes: 0.01 10*3/uL (ref 0.00–0.07)
Basophils Absolute: 0.1 10*3/uL (ref 0.0–0.1)
Basophils Relative: 1 %
Eosinophils Absolute: 0.3 10*3/uL (ref 0.0–0.5)
Eosinophils Relative: 6 %
HCT: 31.6 % — ABNORMAL LOW (ref 39.0–52.0)
Hemoglobin: 11.6 g/dL — ABNORMAL LOW (ref 13.0–17.0)
Immature Granulocytes: 0 %
Lymphocytes Relative: 18 %
Lymphs Abs: 0.9 10*3/uL (ref 0.7–4.0)
MCH: 29.6 pg (ref 26.0–34.0)
MCHC: 36.7 g/dL — ABNORMAL HIGH (ref 30.0–36.0)
MCV: 80.6 fL (ref 80.0–100.0)
Monocytes Absolute: 0.8 10*3/uL (ref 0.1–1.0)
Monocytes Relative: 16 %
Neutro Abs: 3 10*3/uL (ref 1.7–7.7)
Neutrophils Relative %: 59 %
Platelet Count: 159 10*3/uL (ref 150–400)
RBC: 3.92 MIL/uL — ABNORMAL LOW (ref 4.22–5.81)
RDW: 14.9 % (ref 11.5–15.5)
WBC Count: 5 10*3/uL (ref 4.0–10.5)
nRBC: 0 % (ref 0.0–0.2)

## 2022-02-22 LAB — CMP (CANCER CENTER ONLY)
ALT: 15 U/L (ref 0–44)
AST: 32 U/L (ref 15–41)
Albumin: 4.5 g/dL (ref 3.5–5.0)
Alkaline Phosphatase: 34 U/L — ABNORMAL LOW (ref 38–126)
Anion gap: 9 (ref 5–15)
BUN: 61 mg/dL — ABNORMAL HIGH (ref 8–23)
CO2: 23 mmol/L (ref 22–32)
Calcium: 10.5 mg/dL — ABNORMAL HIGH (ref 8.9–10.3)
Chloride: 102 mmol/L (ref 98–111)
Creatinine: 3.08 mg/dL — ABNORMAL HIGH (ref 0.61–1.24)
GFR, Estimated: 21 mL/min — ABNORMAL LOW (ref >60)
Glucose, Bld: 103 mg/dL — ABNORMAL HIGH (ref 70–99)
Potassium: 4.2 mmol/L (ref 3.5–5.1)
Sodium: 134 mmol/L — ABNORMAL LOW (ref 135–145)
Total Bilirubin: 0.7 mg/dL (ref 0.3–1.2)
Total Protein: 7.9 g/dL (ref 6.5–8.1)

## 2022-02-22 LAB — TSH: TSH: 95.306 u[IU]/mL — ABNORMAL HIGH (ref 0.350–4.500)

## 2022-02-22 MED ORDER — SODIUM CHLORIDE 0.9 % IV SOLN
200.0000 mg | Freq: Once | INTRAVENOUS | Status: AC
  Administered 2022-02-22: 200 mg via INTRAVENOUS
  Filled 2022-02-22: qty 8

## 2022-02-22 MED ORDER — SODIUM CHLORIDE 0.9 % IV SOLN: Freq: Once | INTRAVENOUS | Status: AC

## 2022-02-22 NOTE — Patient Instructions (Signed)
[facility]   Discharge Instructions: Thank you for choosing [facility] to provide your oncology and hematology care.   If you have a lab appointment with the [facility], please go directly to the [facility] and check in at the registration area.   Wear comfortable clothing and clothing appropriate for easy access to any Portacath or PICC line.   We strive to give you quality time with your provider. You may need to reschedule your appointment if you arrive late (15 or more minutes).  Arriving late affects you and other patients whose appointments are after yours.  Also, if you miss three or more appointments without notifying the office, you may be dismissed from the clinic at the provider's discretion.      For prescription refill requests, have your pharmacy contact our office and allow 72 hours for refills to be completed.    Today you received the following chemotherapy and/or immunotherapy agents: Pembrolizumab Beryle Flock)       To help prevent nausea and vomiting after your treatment, we encourage you to take your nausea medication as directed.  BELOW ARE SYMPTOMS THAT SHOULD BE REPORTED IMMEDIATELY: *FEVER GREATER THAN 100.4 F (38 C) OR HIGHER *CHILLS OR SWEATING *NAUSEA AND VOMITING THAT IS NOT CONTROLLED WITH YOUR NAUSEA MEDICATION *UNUSUAL SHORTNESS OF BREATH *UNUSUAL BRUISING OR BLEEDING *URINARY PROBLEMS (pain or burning when urinating, or frequent urination) *BOWEL PROBLEMS (unusual diarrhea, constipation, pain near the anus) TENDERNESS IN MOUTH AND THROAT WITH OR WITHOUT PRESENCE OF ULCERS (sore throat, sores in mouth, or a toothache) UNUSUAL RASH, SWELLING OR PAIN  UNUSUAL VAGINAL DISCHARGE OR ITCHING   Items with * indicate a potential emergency and should be followed up as soon as possible or go to the Emergency Department if any problems should occur.  Please show the CHEMOTHERAPY ALERT CARD or IMMUNOTHERAPY  ALERT CARD at check-in to the Emergency Department and triage nurse.  Should you have questions after your visit or need to cancel or reschedule your appointment, please contact [facility]  Dept: [phone]  and follow the prompts.  Office hours are 8:00 a.m. to 4:30 p.m. Monday - Friday. Please note that voicemails left after 4:00 p.m. may not be returned until the following business day.  We are closed weekends and major holidays. You have access to a nurse at all times for urgent questions. Please call the main number to the clinic Dept: [phone] and follow the prompts.   For any non-urgent questions, you may also contact your provider using MyChart. We now offer e-Visits for anyone 45 and older to request care online for non-urgent symptoms. For details visit [email or web address].   Also download the MyChart app! Go to the app store, search "MyChart", open the app, select [facility], and log in with your MyChart username and password.

## 2022-02-22 NOTE — Progress Notes (Signed)
Per Cassie Heilingoetter, PA-C - okay to proceed with treatment with creatinine of 3.08.  Patient to receive IVF with treatment.

## 2022-02-23 ENCOUNTER — Other Ambulatory Visit: Payer: Medicare PPO

## 2022-02-25 ENCOUNTER — Telehealth: Payer: Self-pay | Admitting: Internal Medicine

## 2022-02-25 NOTE — Telephone Encounter (Signed)
Scheduled per 02/19 work-queue, patient has been called and voicemail was left.

## 2022-02-26 ENCOUNTER — Other Ambulatory Visit (HOSPITAL_COMMUNITY): Payer: Self-pay | Admitting: Family Medicine

## 2022-02-26 ENCOUNTER — Other Ambulatory Visit (HOSPITAL_COMMUNITY): Payer: Self-pay | Admitting: Adult Health

## 2022-03-02 ENCOUNTER — Telehealth: Payer: Self-pay | Admitting: *Deleted

## 2022-03-05 ENCOUNTER — Ambulatory Visit: Payer: Medicare PPO

## 2022-03-05 DIAGNOSIS — I447 Left bundle-branch block, unspecified: Secondary | ICD-10-CM | POA: Diagnosis not present

## 2022-03-05 LAB — CUP PACEART REMOTE DEVICE CHECK
Battery Remaining Longevity: 94 mo
Battery Remaining Percentage: 95 %
Battery Voltage: 3.04 V
Brady Statistic AP VP Percent: 6 %
Brady Statistic AP VS Percent: 1 %
Brady Statistic AS VP Percent: 91 %
Brady Statistic AS VS Percent: 1.5 %
Brady Statistic RA Percent Paced: 4.8 %
Date Time Interrogation Session: 20240301021010
HighPow Impedance: 60 Ohm
Implantable Lead Connection Status: 753985
Implantable Lead Connection Status: 753985
Implantable Lead Connection Status: 753985
Implantable Lead Implant Date: 20231130
Implantable Lead Implant Date: 20231130
Implantable Lead Implant Date: 20231130
Implantable Lead Location: 753858
Implantable Lead Location: 753859
Implantable Lead Location: 753860
Implantable Pulse Generator Implant Date: 20231130
Lead Channel Impedance Value: 430 Ohm
Lead Channel Impedance Value: 600 Ohm
Lead Channel Impedance Value: 680 Ohm
Lead Channel Pacing Threshold Amplitude: 0.5 V
Lead Channel Pacing Threshold Amplitude: 0.5 V
Lead Channel Pacing Threshold Amplitude: 1.125 V
Lead Channel Pacing Threshold Pulse Width: 0.5 ms
Lead Channel Pacing Threshold Pulse Width: 0.5 ms
Lead Channel Pacing Threshold Pulse Width: 0.8 ms
Lead Channel Sensing Intrinsic Amplitude: 12 mV
Lead Channel Sensing Intrinsic Amplitude: 5 mV
Lead Channel Setting Pacing Amplitude: 1.5 V
Lead Channel Setting Pacing Amplitude: 1.5 V
Lead Channel Setting Pacing Amplitude: 1.625
Lead Channel Setting Pacing Pulse Width: 0.5 ms
Lead Channel Setting Pacing Pulse Width: 0.8 ms
Lead Channel Setting Sensing Sensitivity: 0.5 mV
Pulse Gen Serial Number: 211010483
Zone Setting Status: 755011

## 2022-03-06 NOTE — Progress Notes (Deleted)
[facility] OFFICE PROGRESS NOTE  Bryce Cain, Bryce Halsted, MD [place] [address]  DIAGNOSIS:  Kidney cancer diagnosed in June 2023.  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     PRIOR THERAPY: Robotic assisted laparoscopic right radical nephrectomy on June 23, 2021.  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     CURRENT THERAPY: Pembrolizumab 200 mg every 3 weeks started on August 19, 2021.  He is here for cycle 10 to complete one year in total 17 cycles.     INTERVAL HISTORY: Bryce Cain 72 y.o. male returns to the clinic today for a follow-up visit.  The patient is currently being treated with immunotherapy for the patient's history of renal cell carcinoma.  The patient is status post 9 cycles and has been tolerating it fair except for dry mouth, taste alterations, and thyroid dysfunction. At his last appointment we recommended biotene, salt water rinses, and OTC saliva substitutes. Of note, he is also on torsemide. He had CHF, last EF 20%. He sees Dr. Gilford Rile from urology. He had not picked up the saliva substitute or the nystatin. Today he denies any changes in his health.  Denies any fever, chills, night sweats, unexplained weight loss.  He reports good appetite.  Denies any chest pain, shortness of breath, cough, or hemoptysis.  Denies any new bone pain.  Denies any nausea, vomiting, diarrhea, or constipation.  Denies any dysuria, foul smelling urine, or blood in the urine.  He denies any prohibitive myalgias and arthralgias.  The patient was recently started on Synthroid for hypothyroidism secondary to his immunotherapy.  He is here today for evaluation and repeat blood work before undergoing cycle #10.   MEDICAL HISTORY: Past Medical History:  Diagnosis Date   CHF (congestive heart failure) (HCC)    DM  (diabetes mellitus), type 2 (HCC)    OSA on CPAP     ALLERGIES:  has No Known Allergies.  MEDICATIONS:  Current Outpatient Medications  Medication Sig Dispense Refill   acetaminophen (TYLENOL) 325 MG tablet Take 1-2 tablets (325-650 mg total) by mouth every 4 (four) hours as needed for mild pain.     aspirin EC 81 MG tablet Take 1 tablet (81 mg total) by mouth daily. Swallow whole. 90 tablet 3   empagliflozin (JARDIANCE) 10 MG TABS tablet Take 1 tablet (10 mg total) by mouth daily. 90 tablet 3   isosorbide-hydrALAZINE (BIDIL) 20-37.5 MG tablet TAKE 1 TABLET BY MOUTH THREE TIMES DAILY 90 tablet 6   levothyroxine (SYNTHROID) 50 MCG tablet TAKE 1 TABLET(50 MCG) BY MOUTH DAILY BEFORE BREAKFAST 90 tablet 1   Multiple Vitamin (MULTIVITAMIN) capsule Take 1 capsule by mouth daily.     nystatin (MYCOSTATIN) 100000 UNIT/ML suspension Take 5 mLs (500,000 Units total) by mouth 4 (four) times daily. 60 mL 0   Omega-3 Fatty Acids (FISH OIL) 1000 MG CAPS Take 1,000 mg by mouth daily.     Pembrolizumab (KEYTRUDA IV) Inject 1 Dose into the vein every 21 ( twenty-one) days.     potassium chloride (KLOR-CON M) 10 MEQ tablet TAKE 1 TABLET(10 MEQ) BY MOUTH DAILY 90 tablet 0   PREVIDENT 5000 ENAMEL PROTECT 1.1-5 % GEL Place 1 application. onto teeth at bedtime.     Probiotic Product (UP4 PROBIOTICS ADULT PO) Take 1 capsule by mouth daily.  rosuvastatin (CRESTOR) 40 MG tablet Take 1 tablet (40 mg total) by mouth daily. 90 tablet 3   torsemide (DEMADEX) 20 MG tablet TAKE 1 TABLET BY MOUTH DAILY 30 tablet 6   No current facility-administered medications for this visit.    SURGICAL HISTORY:  Past Surgical History:  Procedure Laterality Date   BIV ICD INSERTION CRT-D N/A 12/03/2021   Procedure: BIV ICD INSERTION CRT-D;  Surgeon: Vickie Epley, MD;  Location: [facility];  Service: Cardiovascular;  Laterality: N/A;   RIGHT/LEFT HEART CATH AND CORONARY ANGIOGRAPHY N/A 02/16/2021   Procedure:  RIGHT/LEFT HEART CATH AND CORONARY ANGIOGRAPHY;  Surgeon: Jolaine Artist, MD;  Location: [facility];  Service: Cardiovascular;  Laterality: N/A;   ROBOT ASSISTED LAPAROSCOPIC NEPHRECTOMY Right 06/23/2021   Procedure: XI ROBOTIC ASSISTED LAPAROSCOPIC NEPHRECTOMY;  Surgeon: Ceasar Mons, MD;  Location: [facility];  Service: Urology;  Laterality: Right;    REVIEW OF SYSTEMS:   Review of Systems  Constitutional: Negative for appetite change, chills, fatigue, fever and unexpected weight change.  HENT:   Negative for mouth sores, nosebleeds, sore throat and trouble swallowing.   Eyes: Negative for eye problems and icterus.  Respiratory: Negative for cough, hemoptysis, shortness of breath and wheezing.   Cardiovascular: Negative for chest pain and leg swelling.  Gastrointestinal: Negative for abdominal pain, constipation, diarrhea, nausea and vomiting.  Genitourinary: Negative for bladder incontinence, difficulty urinating, dysuria, frequency and hematuria.   Musculoskeletal: Negative for back pain, gait problem, neck pain and neck stiffness.  Skin: Negative for itching and rash.  Neurological: Negative for dizziness, extremity weakness, gait problem, headaches, light-headedness and seizures.  Hematological: Negative for adenopathy. Does not bruise/bleed easily.  Psychiatric/Behavioral: Negative for confusion, depression and sleep disturbance. The patient is not nervous/anxious.     PHYSICAL EXAMINATION:  There were no vitals taken for this visit.  ECOG PERFORMANCE STATUS: {CHL ONC ECOG [id number]  Physical Exam  Constitutional: Oriented to person, place, and time and well-developed, well-nourished, and in no distress. No distress.  HENT:  Head: Normocephalic and atraumatic.  Mouth/Throat: Oropharynx is clear and moist. No oropharyngeal exudate.  Eyes: Conjunctivae are normal. Right eye exhibits no discharge. Left eye exhibits no discharge. No scleral icterus.  Neck:  Normal range of motion. Neck supple.  Cardiovascular: Normal rate, regular rhythm, normal heart sounds and intact distal pulses.   Pulmonary/Chest: Effort normal and breath sounds normal. No respiratory distress. No wheezes. No rales.  Abdominal: Soft. Bowel sounds are normal. Exhibits no distension and no mass. There is no tenderness.  Musculoskeletal: Normal range of motion. Exhibits no edema.  Lymphadenopathy:    No cervical adenopathy.  Neurological: Alert and oriented to person, place, and time. Exhibits normal muscle tone. Gait normal. Coordination normal.  Skin: Skin is warm and dry. No rash noted. Not diaphoretic. No erythema. No pallor.  Psychiatric: Mood, memory and judgment normal.  Vitals reviewed.  LABORATORY DATA: Lab Results  Component Value Date   WBC 5.0 02/22/2022   HGB 11.6 (L) 02/22/2022   HCT 31.6 (L) 02/22/2022   MCV 80.6 02/22/2022   PLT 159 02/22/2022      Chemistry      Component Value Date/Time   NA 134 (L) 02/22/2022 1018   NA 137 11/23/2021 1145   K 4.2 02/22/2022 1018   CL 102 02/22/2022 1018   CO2 23 02/22/2022 1018   BUN 61 (H) 02/22/2022 1018   BUN 48 (H) 11/23/2021 1145   CREATININE 3.08 (H)  02/22/2022 1018   GLU 98 07/10/2021 0000      Component Value Date/Time   CALCIUM 10.5 (H) 02/22/2022 1018   ALKPHOS 34 (L) 02/22/2022 1018   AST 32 02/22/2022 1018   ALT 15 02/22/2022 1018   BILITOT 0.7 02/22/2022 1018       RADIOGRAPHIC STUDIES:  CUP PACEART REMOTE DEVICE CHECK  Result Date: 03/05/2022 Scheduled remote reviewed. Normal device function.  Next remote 91 days. [place]    ASSESSMENT/PLAN:  This is a very pleasant 72 year old African American male diagnosed with right sided renal cell carcinoma in June 2023.  He was found to have a T3a clear-cell without any evidence of metastatic disease.   He underwent robotic assisted laparoscopic right radical nephrectomy on 06/23/21.   He is currently on Keytruda 200 mg IV every 3 weeks.  He is  tolerating this well.  He is status post 9 cycles.  The plan is to complete 1 year of treatment which will be 17 cycles.    Labs were reviewed.  Recommend that he proceed with cycle #9 today's schedule.  The patient has CKD.  Okay to treat with a creatinine of *** today. He is hypotensive today with a BP of ***. We will give only give him *** L of fluid over 2 hours today due to his CHF and low EF.    He will continue taking Synthroid for his hypothyroidism.  We will see him back for follow-up visit in 3 weeks for evaluation repeat blood work before undergoing cycle #10.   For his taste alterations, He was also encouraged to do salt water rinses, biotene, and make sure he brushes his tongue. Discussed OTC products for dry mouth such as salivasure or other saliva substitutes. I also recommended he pick up nystatin.    Of note, the patient has trouble with morning appointments. I will ask scheduling to make sure his appointments are in the afternoon moving forward.   The patient was advised to call immediately if she has any concerning symptoms in the interval. The patient voices understanding of current disease status and treatment options and is in agreement with the current care plan. All questions were answered. The patient knows to call the clinic with any problems, questions or concerns. We can certainly see the patient much sooner if necessary        No orders of the defined types were placed in this encounter.    I spent {CHL ONC TIME VISIT - [id number] counseling the patient face to face. The total time spent in the appointment was {CHL ONC TIME VISIT - [id number].  Cherlyn Syring L Zara Wendt, PA-C 03/06/22

## 2022-03-08 ENCOUNTER — Telehealth: Payer: Self-pay | Admitting: Physician Assistant

## 2022-03-08 NOTE — Progress Notes (Unsigned)
[facility] OFFICE PROGRESS NOTE  Bryce Cain, Bryce Halsted, MD [place] [address]  DIAGNOSIS: Kidney cancer diagnosed in June 2023.  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     PRIOR THERAPY: Robotic assisted laparoscopic right radical nephrectomy on June 23, 2021.  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     CURRENT THERAPY: Pembrolizumab 200 mg every 3 weeks started on August 19, 2021.  He is here for cycle 10 to complete one year in total 17 cycles.   INTERVAL HISTORY: Bryce Cain 72 y.o. male returns to the clinic today for a follow-up visit.  The patient is currently being treated with immunotherapy for the patient's history of renal cell carcinoma.  The patient is status post 9 cycles and has been tolerating it fair except for dry mouth, taste alterations, and thyroid dysfunction. At his last two appointment we recommended biotene, salt water rinses, and OTC saliva substitutes. He also was given nystatin for some oral thrush which he had not picked up. He still has not picked this up but is going to soon. Of note, he is also on torsemide. He had CHF, last EF 20%. He saw his cardiologist yesterday. Whenever he comes into the clinic, his BP reads as hypotensive with systolic Bps in the [id number]. He is asymptomatic and denies lightheadedness or dizziness. He does not check his BP at home.  He actually saw his cardiologist yesterday and his BP in the clinic read is 120/68.  No medication adjustments were made at that time.  He mentions he is expected to have an echocardiogram performed in the near future.  He sees Dr. Gilford Rile from urology.  He does not have a nephrologist for his CKD.  Today he denies any changes in his health.  Denies any fever, chills, night sweats, unexplained weight loss.  He reports good appetite.   He reports he takes a multivitamin every day with Centrum.  Denies any chest pain, shortness of breath, cough, or hemoptysis.  Denies any new bone pain.  Denies any nausea, vomiting, diarrhea, or constipation.  Denies any dysuria, foul smelling urine, or blood in the urine.  He denies any prohibitive myalgias and arthralgias.  The patient was recently started on Synthroid for hypothyroidism secondary to his immunotherapy.  He is here today for evaluation and repeat blood work before undergoing cycle #10.      MEDICAL HISTORY: Past Medical History:  Diagnosis Date   CHF (congestive heart failure) (HCC)    DM (diabetes mellitus), type 2 (HCC)    OSA on CPAP     ALLERGIES:  has No Known Allergies.  MEDICATIONS:  Current Outpatient Medications  Medication Sig Dispense Refill   acetaminophen (TYLENOL) 325 MG tablet Take 1-2 tablets (325-650 mg total) by mouth every 4 (four) hours as needed for mild pain.     aspirin EC 81 MG tablet Take 1 tablet (81 mg total) by mouth daily. Swallow whole. 90 tablet 3   empagliflozin (JARDIANCE) 10 MG TABS tablet Take 1 tablet (10 mg total) by mouth daily. 90 tablet 3   isosorbide-hydrALAZINE (BIDIL) 20-37.5 MG tablet TAKE 1 TABLET BY MOUTH THREE TIMES DAILY 90 tablet 6   levothyroxine (SYNTHROID) 50 MCG tablet TAKE 1 TABLET(50 MCG) BY MOUTH DAILY BEFORE BREAKFAST 90 tablet 1   Multiple Vitamin (MULTIVITAMIN) capsule Take 1  capsule by mouth daily.     nystatin (MYCOSTATIN) 100000 UNIT/ML suspension Take 5 mLs (500,000 Units total) by mouth 4 (four) times daily. 60 mL 0   Omega-3 Fatty Acids (FISH OIL) 1000 MG CAPS Take 1,000 mg by mouth daily.     Pembrolizumab (KEYTRUDA IV) Inject 1 Dose into the vein every 21 ( twenty-one) days.     potassium chloride (KLOR-CON M) 10 MEQ tablet TAKE 1 TABLET(10 MEQ) BY MOUTH DAILY 90 tablet 0   PREVIDENT 5000 ENAMEL PROTECT 1.1-5 % GEL Place 1 application. onto teeth at bedtime.     Probiotic Product (UP4 PROBIOTICS ADULT PO)  Take 1 capsule by mouth daily.     rosuvastatin (CRESTOR) 40 MG tablet Take 1 tablet (40 mg total) by mouth daily. 90 tablet 3   torsemide (DEMADEX) 20 MG tablet TAKE 1 TABLET BY MOUTH DAILY 30 tablet 6   No current facility-administered medications for this visit.    SURGICAL HISTORY:  Past Surgical History:  Procedure Laterality Date   BIV ICD INSERTION CRT-D N/A 12/03/2021   Procedure: BIV ICD INSERTION CRT-D;  Surgeon: Vickie Epley, MD;  Location: [facility];  Service: Cardiovascular;  Laterality: N/A;   RIGHT/LEFT HEART CATH AND CORONARY ANGIOGRAPHY N/A 02/16/2021   Procedure: RIGHT/LEFT HEART CATH AND CORONARY ANGIOGRAPHY;  Surgeon: Jolaine Artist, MD;  Location: [facility];  Service: Cardiovascular;  Laterality: N/A;   ROBOT ASSISTED LAPAROSCOPIC NEPHRECTOMY Right 06/23/2021   Procedure: XI ROBOTIC ASSISTED LAPAROSCOPIC NEPHRECTOMY;  Surgeon: Ceasar Mons, MD;  Location: [facility];  Service: Urology;  Laterality: Right;    REVIEW OF SYSTEMS:   Constitutional: Negative for appetite change, chills, fatigue, fever and unexpected weight change.  HENT: Positive for dry mouth and taste alterations. Negative for mouth sores, nosebleeds, sore throat and trouble swallowing.   Eyes: Negative for eye problems and icterus.  Respiratory: Negative for cough, hemoptysis, shortness of breath and wheezing.   Cardiovascular: Negative for chest pain and leg swelling.  Gastrointestinal: Negative for abdominal pain, constipation, diarrhea, nausea and vomiting.  Genitourinary: Negative for bladder incontinence, difficulty urinating, dysuria, frequency and hematuria.   Musculoskeletal: Negative for back pain, gait problem, neck pain and neck stiffness.  Skin: Negative for itching and rash.  Neurological: Negative for dizziness, extremity weakness, gait problem, headaches, light-headedness and seizures.  Hematological: Negative for adenopathy. Does not bruise/bleed  easily.  Psychiatric/Behavioral: Negative for confusion, depression and sleep disturbance. The patient is not nervous/anxious.   PHYSICAL EXAMINATION:  There were no vitals taken for this visit.  ECOG PERFORMANCE STATUS: 1  Physical Exam  Constitutional: Oriented to person, place, and time and well-developed, well-nourished, and in no distress.  HENT:  Head: Normocephalic and atraumatic.  Mouth/Throat: Oropharynx has some thrush. Dry appearing. No oropharyngeal exudate.  Eyes: Conjunctivae are normal. Right eye exhibits no discharge. Left eye exhibits no discharge. No scleral icterus.  Neck: Normal range of motion. Neck supple.  Cardiovascular: Normal rate, regular rhythm, normal heart sounds and intact distal pulses.   Pulmonary/Chest: Effort normal and breath sounds normal. No respiratory distress. No wheezes. No rales.  Abdominal: Soft. Bowel sounds are normal. Exhibits no distension and no mass. There is no tenderness.  Musculoskeletal: Normal range of motion. Exhibits no edema.  Lymphadenopathy:    No cervical adenopathy.  Neurological: Alert and oriented to person, place, and time. Exhibits normal muscle tone. Gait normal. Coordination normal.  Skin: Skin is warm and dry. No rash noted. Not diaphoretic.  No erythema. No pallor.  Psychiatric: Mood, memory and judgment normal.  Vitals reviewed.  LABORATORY DATA: Lab Results  Component Value Date   WBC 5.0 02/22/2022   HGB 11.6 (L) 02/22/2022   HCT 31.6 (L) 02/22/2022   MCV 80.6 02/22/2022   PLT 159 02/22/2022      Chemistry      Component Value Date/Time   NA 134 (L) 02/22/2022 1018   NA 137 11/23/2021 1145   K 4.2 02/22/2022 1018   CL 102 02/22/2022 1018   CO2 23 02/22/2022 1018   BUN 61 (H) 02/22/2022 1018   BUN 48 (H) 11/23/2021 1145   CREATININE 3.08 (H) 02/22/2022 1018   GLU 98 07/10/2021 0000      Component Value Date/Time   CALCIUM 10.5 (H) 02/22/2022 1018   ALKPHOS 34 (L) 02/22/2022 1018   AST 32  02/22/2022 1018   ALT 15 02/22/2022 1018   BILITOT 0.7 02/22/2022 1018       RADIOGRAPHIC STUDIES:  CUP PACEART REMOTE DEVICE CHECK  Result Date: 03/05/2022 Scheduled remote reviewed. Normal device function.  Next remote 91 days. [place]    ASSESSMENT/PLAN:  This is a very pleasant 72 year old African American male diagnosed with right sided renal cell carcinoma in June 2023.  He was found to have a T3a clear-cell without any evidence of metastatic disease.   He underwent robotic assisted laparoscopic right radical nephrectomy on 06/23/21.   He is currently on Keytruda 200 mg IV every 3 weeks.  He is tolerating this well.  He is status post 9 cycles.  The plan is to complete 1 year of treatment which will be 17 cycles.    Labs were reviewed.  I reviewed the patient's appointment with Dr. Julien Nordmann today.  Recommend that he proceed with cycle #10 today as schedule.  The patient has CKD.  Okay to treat with a creatinine of 3.29 today. However, we feel it is a good idea to establish care with a nephrologist for his CKD. He is hypotensive today with a BP of 92/62. We will give only give him 1/2 L of fluid over 2 hours today due to his CHF and low EF.   He will continue taking Synthroid for his hypothyroidism.   We will see him back for follow-up visit in 3 weeks for evaluation repeat blood work before undergoing cycle #11.   For his taste alterations, He was also encouraged to do salt water rinses, biotene, and make sure he brushes his tongue. Discussed OTC products for dry mouth such as salivasure or other saliva substitutes. I also recommended he pick up nystatin.   His calcium a little high. Advised to cut down on his multivitamin every other day instead of daily.    The patient was advised to call immediately if he has any concerning symptoms in the interval. The patient voices understanding of current disease status and treatment options and is in agreement with the current care plan. All  questions were answered. The patient knows to call the clinic with any problems, questions or concerns. We can certainly see the patient much sooner if necessary     No orders of the defined types were placed in this encounter.    The total time spent in the appointment was 20-29 minutes.   Lashawnta Burgert L Keari Miu, PA-C 03/08/22

## 2022-03-08 NOTE — Telephone Encounter (Signed)
Patient called to rescheduled appointment times. Patient rescheduled.

## 2022-03-09 ENCOUNTER — Encounter: Payer: Self-pay | Admitting: Cardiology

## 2022-03-09 ENCOUNTER — Other Ambulatory Visit: Payer: Medicare PPO

## 2022-03-09 ENCOUNTER — Ambulatory Visit: Payer: Medicare PPO

## 2022-03-09 ENCOUNTER — Ambulatory Visit: Payer: Medicare PPO | Admitting: Physician Assistant

## 2022-03-09 ENCOUNTER — Ambulatory Visit: Payer: Medicare PPO | Attending: Cardiology | Admitting: Cardiology

## 2022-03-09 VITALS — BP 120/68 | HR 78 | Ht 70.0 in | Wt 229.0 lb

## 2022-03-09 DIAGNOSIS — Z95810 Presence of automatic (implantable) cardiac defibrillator: Secondary | ICD-10-CM

## 2022-03-09 DIAGNOSIS — I447 Left bundle-branch block, unspecified: Secondary | ICD-10-CM | POA: Diagnosis not present

## 2022-03-09 DIAGNOSIS — I5022 Chronic systolic (congestive) heart failure: Secondary | ICD-10-CM | POA: Diagnosis not present

## 2022-03-09 NOTE — Patient Instructions (Signed)
Medication Instructions:  Your physician recommends that you continue on your current medications as directed. Please refer to the Current Medication list given to you today.  *If you need a refill on your cardiac medications before your next appointment, please call your pharmacy*  Testing/Procedures: Your physician has requested that you have an echocardiogram. Echocardiography is a painless test that uses sound waves to create images of your heart. It provides your doctor with information about the size and shape of your heart and how well your heart's chambers and valves are working. This procedure takes approximately one hour. There are no restrictions for this procedure. Please do NOT wear cologne, perfume, aftershave, or lotions (deodorant is allowed). Please arrive 15 minutes prior to your appointment time.  Follow-Up: At [facility], you and your health needs are our priority.  As part of our continuing mission to provide you with exceptional heart care, we have created designated Provider Care Teams.  These Care Teams include your primary Cardiologist (physician) and Advanced Practice Providers (APPs -  Physician Assistants and Nurse Practitioners) who all work together to provide you with the care you need, when you need it.  Your next appointment:   6 month(s)  Provider:   Lars Mage, MD

## 2022-03-09 NOTE — Progress Notes (Signed)
Electrophysiology Office Follow up Visit Note:    Date:  03/09/2022   ID:  Bryce Cain, DOB 1950/09/19, MRN [id number]  PCP:  [name], [name], MD  [place] Cardiologist:  None  [facility] Electrophysiologist:  Vickie Epley, MD    Interval History:    Bryce Cain is a 72 y.o. male who presents for a follow up visit.   He had a BiV ICD implanted December 03, 2021.  Remote interrogations since implant have shown stable device function.  Today, he states he is feeling well. He is frustrated that he has not been contacted regarding the results from his device transmission after his fall. We reviewed the event that occurred 01/16/22 when he had fallen to his knees in the shower. He had raised his arms to shampoo his hair, felt like he weighed 10,000 lbs. He then sensed that he was going down, and he fell to the shower floor. He did not lose consciousness. Initially he was unable to stand up. He crawled out of the shower and suffered a significant laceration on his left shin. When he reached the toilet he was able to push himself up and stand on his feet. He went back to rinse his hair and then lied down in bed.  He continues to experience what he labels as "tremors". These are sensations in his substernal chest; he motions in a jerking, rhythmic pattern over his sternum. Usually this occurs in the evenings, or as late as 1-2 AM. Episodes of these tremors last 8-12 seconds, which he states includes 4-5 heartbeats. He has tried to palpate the area, but he is never able to manipulate the discomfort as it feels deeper.  He endorses severe dehydration and dry mouth, as well as the loss of his sense of taste and smell. He states his taste is 25% back to baseline. He is not using Mycostatin. We reviewed proper techniques of using this medication.  He denies any issues with LE edema. At night he lies flat or is propped up as he may sleep restlessly.  He denies any chest pain,  shortness of breath, lightheadedness, headaches, syncope, orthopnea, or PND.     Past medical, surgical, social and family history were reviewed.  ROS:   Please see the history of present illness.    (+) Palpitations/Tremors (+) Dry mouth All other systems reviewed and are negative.  EKGs/Labs/Other Studies Reviewed:    The following studies were reviewed today:  May 29, 2021 echo showed an ejection fraction less than 20%, normal RV function  March 09, 2022 in clinic device interrogation personally reviewed Battery longevity 8 years Lead parameter stable BiV pacing 97% Atrial pacing 4.7% No changes made today   EKG:  The ekg ordered today demonstrates a sensed, BiV paced.  Infrequent PVCs   Physical Exam:    VS:  BP 120/68   Pulse 78   Ht '5\' 10"'$  (1.778 m)   Wt 229 lb (103.9 kg)   SpO2 96%   BMI 32.86 kg/m     Wt Readings from Last 3 Encounters:  03/09/22 229 lb (103.9 kg)  02/22/22 228 lb 8 oz (103.6 kg)  01/26/22 229 lb 4.8 oz (104 kg)     GEN: Well nourished, well developed in no acute distress CARDIAC: RRR, no murmurs, rubs, gallops.  ICD pocket well-healed RESPIRATORY:  Clear to auscultation without rales, wheezing or rhonchi       ASSESSMENT:    1. Chronic systolic heart  failure ([place])   2. Left bundle branch block   3. Presence of cardiac resynchronization therapy defibrillator (CRT-D)    PLAN:    In order of problems listed above:  #Chronic systolic heart failure #CRT-D in situ Device functioning appropriately.  Continue remote monitoring BiV pacing percentage at goal.  97% due to infrequent PVCs. Continue medical therapy including Jardiance, BiDil, torsemide  Repeat echo  Follow-up 6 months with APP in clinic.   I,Mathew Stumpf,acting as a Education administrator for Vickie Epley, MD.,have documented all relevant documentation on the behalf of Vickie Epley, MD,as directed by  Vickie Epley, MD while in the presence of Vickie Epley,  MD.  I, Vickie Epley, MD, have reviewed all documentation for this visit. The documentation on 03/09/22 for the exam, diagnosis, procedures, and orders are all accurate and complete.   Signed, Lars Mage, MD, [facility], [facility] 03/09/2022 3:05 PM    Electrophysiology [facility]

## 2022-03-10 ENCOUNTER — Ambulatory Visit: Payer: Medicare PPO | Admitting: Physician Assistant

## 2022-03-10 ENCOUNTER — Inpatient Hospital Stay: Payer: Medicare PPO | Admitting: Physician Assistant

## 2022-03-10 ENCOUNTER — Ambulatory Visit: Payer: Medicare PPO

## 2022-03-10 ENCOUNTER — Inpatient Hospital Stay: Payer: Medicare PPO

## 2022-03-10 ENCOUNTER — Other Ambulatory Visit: Payer: Medicare PPO

## 2022-03-10 ENCOUNTER — Other Ambulatory Visit: Payer: Self-pay

## 2022-03-10 ENCOUNTER — Inpatient Hospital Stay (HOSPITAL_BASED_OUTPATIENT_CLINIC_OR_DEPARTMENT_OTHER): Payer: Medicare PPO | Admitting: Physician Assistant

## 2022-03-10 ENCOUNTER — Inpatient Hospital Stay: Payer: Medicare PPO | Attending: Internal Medicine

## 2022-03-10 VITALS — BP 92/62 | HR 72 | Temp 97.9°F | Resp 15 | Wt 227.4 lb

## 2022-03-10 VITALS — BP 106/58 | HR 72 | Resp 16

## 2022-03-10 DIAGNOSIS — Z79899 Other long term (current) drug therapy: Secondary | ICD-10-CM | POA: Insufficient documentation

## 2022-03-10 DIAGNOSIS — C641 Malignant neoplasm of right kidney, except renal pelvis: Secondary | ICD-10-CM

## 2022-03-10 DIAGNOSIS — I509 Heart failure, unspecified: Secondary | ICD-10-CM | POA: Insufficient documentation

## 2022-03-10 DIAGNOSIS — Z5112 Encounter for antineoplastic immunotherapy: Secondary | ICD-10-CM | POA: Diagnosis not present

## 2022-03-10 DIAGNOSIS — E039 Hypothyroidism, unspecified: Secondary | ICD-10-CM | POA: Insufficient documentation

## 2022-03-10 DIAGNOSIS — R7989 Other specified abnormal findings of blood chemistry: Secondary | ICD-10-CM | POA: Diagnosis not present

## 2022-03-10 DIAGNOSIS — Z905 Acquired absence of kidney: Secondary | ICD-10-CM | POA: Insufficient documentation

## 2022-03-10 DIAGNOSIS — E1122 Type 2 diabetes mellitus with diabetic chronic kidney disease: Secondary | ICD-10-CM | POA: Diagnosis not present

## 2022-03-10 DIAGNOSIS — N189 Chronic kidney disease, unspecified: Secondary | ICD-10-CM | POA: Insufficient documentation

## 2022-03-10 DIAGNOSIS — Z7984 Long term (current) use of oral hypoglycemic drugs: Secondary | ICD-10-CM | POA: Insufficient documentation

## 2022-03-10 DIAGNOSIS — Z7982 Long term (current) use of aspirin: Secondary | ICD-10-CM | POA: Insufficient documentation

## 2022-03-10 LAB — CMP (CANCER CENTER ONLY)
ALT: 16 U/L (ref 0–44)
AST: 35 U/L (ref 15–41)
Albumin: 4.5 g/dL (ref 3.5–5.0)
Alkaline Phosphatase: 34 U/L — ABNORMAL LOW (ref 38–126)
Anion gap: 7 (ref 5–15)
BUN: 68 mg/dL — ABNORMAL HIGH (ref 8–23)
CO2: 25 mmol/L (ref 22–32)
Calcium: 10.7 mg/dL — ABNORMAL HIGH (ref 8.9–10.3)
Chloride: 103 mmol/L (ref 98–111)
Creatinine: 3.29 mg/dL — ABNORMAL HIGH (ref 0.61–1.24)
GFR, Estimated: 19 mL/min — ABNORMAL LOW (ref >60)
Glucose, Bld: 97 mg/dL (ref 70–99)
Potassium: 4.5 mmol/L (ref 3.5–5.1)
Sodium: 135 mmol/L (ref 135–145)
Total Bilirubin: 0.6 mg/dL (ref 0.3–1.2)
Total Protein: 8.1 g/dL (ref 6.5–8.1)

## 2022-03-10 LAB — CBC WITH DIFFERENTIAL (CANCER CENTER ONLY)
Abs Immature Granulocytes: 0.02 10*3/uL (ref 0.00–0.07)
Basophils Absolute: 0.1 10*3/uL (ref 0.0–0.1)
Basophils Relative: 1 %
Eosinophils Absolute: 0.3 10*3/uL (ref 0.0–0.5)
Eosinophils Relative: 6 %
HCT: 31.1 % — ABNORMAL LOW (ref 39.0–52.0)
Hemoglobin: 11.4 g/dL — ABNORMAL LOW (ref 13.0–17.0)
Immature Granulocytes: 0 %
Lymphocytes Relative: 16 %
Lymphs Abs: 0.8 10*3/uL (ref 0.7–4.0)
MCH: 29.9 pg (ref 26.0–34.0)
MCHC: 36.7 g/dL — ABNORMAL HIGH (ref 30.0–36.0)
MCV: 81.6 fL (ref 80.0–100.0)
Monocytes Absolute: 0.8 10*3/uL (ref 0.1–1.0)
Monocytes Relative: 16 %
Neutro Abs: 3.2 10*3/uL (ref 1.7–7.7)
Neutrophils Relative %: 61 %
Platelet Count: 143 10*3/uL — ABNORMAL LOW (ref 150–400)
RBC: 3.81 MIL/uL — ABNORMAL LOW (ref 4.22–5.81)
RDW: 14.6 % (ref 11.5–15.5)
WBC Count: 5.3 10*3/uL (ref 4.0–10.5)
nRBC: 0 % (ref 0.0–0.2)

## 2022-03-10 MED ORDER — SODIUM CHLORIDE 0.9 % IV SOLN
200.0000 mg | Freq: Once | INTRAVENOUS | Status: AC
  Administered 2022-03-10: 200 mg via INTRAVENOUS
  Filled 2022-03-10: qty 8

## 2022-03-10 MED ORDER — SODIUM CHLORIDE 0.9 % IV SOLN: Freq: Once | INTRAVENOUS | Status: AC

## 2022-03-10 NOTE — Patient Instructions (Signed)
[place] [facility]  Discharge Instructions: Thank you for choosing [facility] to provide your oncology and hematology care.   If you have a lab appointment with the [facility], please go directly to the [facility] and check in at the registration area.   Wear comfortable clothing and clothing appropriate for easy access to any Portacath or PICC line.   We strive to give you quality time with your provider. You may need to reschedule your appointment if you arrive late (15 or more minutes).  Arriving late affects you and other patients whose appointments are after yours.  Also, if you miss three or more appointments without notifying the office, you may be dismissed from the clinic at the provider's discretion.      For prescription refill requests, have your pharmacy contact our office and allow 72 hours for refills to be completed.    Today you received the following chemotherapy and/or immunotherapy agents: pembrolizumab      To help prevent nausea and vomiting after your treatment, we encourage you to take your nausea medication as directed.  BELOW ARE SYMPTOMS THAT SHOULD BE REPORTED IMMEDIATELY: *FEVER GREATER THAN 100.4 F (38 C) OR HIGHER *CHILLS OR SWEATING *NAUSEA AND VOMITING THAT IS NOT CONTROLLED WITH YOUR NAUSEA MEDICATION *UNUSUAL SHORTNESS OF BREATH *UNUSUAL BRUISING OR BLEEDING *URINARY PROBLEMS (pain or burning when urinating, or frequent urination) *BOWEL PROBLEMS (unusual diarrhea, constipation, pain near the anus) TENDERNESS IN MOUTH AND THROAT WITH OR WITHOUT PRESENCE OF ULCERS (sore throat, sores in mouth, or a toothache) UNUSUAL RASH, SWELLING OR PAIN  UNUSUAL VAGINAL DISCHARGE OR ITCHING   Items with * indicate a potential emergency and should be followed up as soon as possible or go to the Emergency Department if any problems should occur.  Please show the CHEMOTHERAPY ALERT CARD or IMMUNOTHERAPY ALERT CARD at  check-in to the Emergency Department and triage nurse.  Should you have questions after your visit or need to cancel or reschedule your appointment, please contact [facility]  Dept: [phone]  and follow the prompts.  Office hours are 8:00 a.m. to 4:30 p.m. Monday - Friday. Please note that voicemails left after 4:00 p.m. may not be returned until the following business day.  We are closed weekends and major holidays. You have access to a nurse at all times for urgent questions. Please call the main number to the clinic Dept: [phone] and follow the prompts.   For any non-urgent questions, you may also contact your provider using MyChart. We now offer e-Visits for anyone 81 and older to request care online for non-urgent symptoms. For details visit [email or web address].   Also download the MyChart app! Go to the app store, search "MyChart", open the app, select [facility], and log in with your MyChart username and password.

## 2022-03-10 NOTE — Progress Notes (Signed)
Per Cassie H, PA-C, ok to treat today with elevated creatinine level. Will give 500cc NS over 2 hours. Ok to run fluids concurrently per Cassie.

## 2022-03-11 NOTE — Progress Notes (Signed)
This nurse faxed referral, office notes, lab results and patient demographics to [facility] per provider request.  Fax number:  [phone].

## 2022-03-14 DIAGNOSIS — G4733 Obstructive sleep apnea (adult) (pediatric): Secondary | ICD-10-CM | POA: Diagnosis not present

## 2022-03-16 ENCOUNTER — Other Ambulatory Visit: Payer: Medicare PPO

## 2022-03-16 ENCOUNTER — Ambulatory Visit: Payer: Medicare PPO | Admitting: Physician Assistant

## 2022-03-16 ENCOUNTER — Ambulatory Visit: Payer: Medicare PPO

## 2022-03-18 ENCOUNTER — Ambulatory Visit: Payer: Medicare PPO | Admitting: Physician Assistant

## 2022-03-18 ENCOUNTER — Other Ambulatory Visit: Payer: Medicare PPO

## 2022-03-18 ENCOUNTER — Ambulatory Visit: Payer: Medicare PPO

## 2022-03-26 NOTE — Progress Notes (Signed)
Remote ICD transmission.   

## 2022-03-30 ENCOUNTER — Inpatient Hospital Stay: Payer: Medicare PPO

## 2022-03-30 ENCOUNTER — Encounter: Payer: Self-pay | Admitting: Medical Oncology

## 2022-03-30 ENCOUNTER — Other Ambulatory Visit: Payer: Self-pay

## 2022-03-30 ENCOUNTER — Inpatient Hospital Stay (HOSPITAL_BASED_OUTPATIENT_CLINIC_OR_DEPARTMENT_OTHER): Payer: Medicare PPO | Admitting: Internal Medicine

## 2022-03-30 VITALS — BP 99/60 | HR 75 | Temp 98.1°F | Resp 16 | Wt 229.5 lb

## 2022-03-30 VITALS — BP 112/62 | HR 71 | Temp 98.0°F | Resp 18

## 2022-03-30 DIAGNOSIS — E039 Hypothyroidism, unspecified: Secondary | ICD-10-CM | POA: Diagnosis not present

## 2022-03-30 DIAGNOSIS — C641 Malignant neoplasm of right kidney, except renal pelvis: Secondary | ICD-10-CM

## 2022-03-30 DIAGNOSIS — Z5112 Encounter for antineoplastic immunotherapy: Secondary | ICD-10-CM | POA: Diagnosis not present

## 2022-03-30 DIAGNOSIS — Z7984 Long term (current) use of oral hypoglycemic drugs: Secondary | ICD-10-CM | POA: Diagnosis not present

## 2022-03-30 DIAGNOSIS — N189 Chronic kidney disease, unspecified: Secondary | ICD-10-CM | POA: Diagnosis not present

## 2022-03-30 DIAGNOSIS — Z7982 Long term (current) use of aspirin: Secondary | ICD-10-CM | POA: Diagnosis not present

## 2022-03-30 DIAGNOSIS — I509 Heart failure, unspecified: Secondary | ICD-10-CM | POA: Diagnosis not present

## 2022-03-30 DIAGNOSIS — Z905 Acquired absence of kidney: Secondary | ICD-10-CM | POA: Diagnosis not present

## 2022-03-30 DIAGNOSIS — E1122 Type 2 diabetes mellitus with diabetic chronic kidney disease: Secondary | ICD-10-CM | POA: Diagnosis not present

## 2022-03-30 LAB — CMP (CANCER CENTER ONLY)
ALT: 18 U/L (ref 0–44)
AST: 35 U/L (ref 15–41)
Albumin: 4.6 g/dL (ref 3.5–5.0)
Alkaline Phosphatase: 34 U/L — ABNORMAL LOW (ref 38–126)
Anion gap: 7 (ref 5–15)
BUN: 61 mg/dL — ABNORMAL HIGH (ref 8–23)
CO2: 24 mmol/L (ref 22–32)
Calcium: 10.4 mg/dL — ABNORMAL HIGH (ref 8.9–10.3)
Chloride: 104 mmol/L (ref 98–111)
Creatinine: 2.9 mg/dL — ABNORMAL HIGH (ref 0.61–1.24)
GFR, Estimated: 22 mL/min — ABNORMAL LOW (ref >60)
Glucose, Bld: 97 mg/dL (ref 70–99)
Potassium: 4.4 mmol/L (ref 3.5–5.1)
Sodium: 135 mmol/L (ref 135–145)
Total Bilirubin: 0.7 mg/dL (ref 0.3–1.2)
Total Protein: 7.8 g/dL (ref 6.5–8.1)

## 2022-03-30 LAB — CBC WITH DIFFERENTIAL (CANCER CENTER ONLY)
Abs Immature Granulocytes: 0.01 10*3/uL (ref 0.00–0.07)
Basophils Absolute: 0.1 10*3/uL (ref 0.0–0.1)
Basophils Relative: 1 %
Eosinophils Absolute: 0.3 10*3/uL (ref 0.0–0.5)
Eosinophils Relative: 6 %
HCT: 33.5 % — ABNORMAL LOW (ref 39.0–52.0)
Hemoglobin: 12.2 g/dL — ABNORMAL LOW (ref 13.0–17.0)
Immature Granulocytes: 0 %
Lymphocytes Relative: 21 %
Lymphs Abs: 0.9 10*3/uL (ref 0.7–4.0)
MCH: 30 pg (ref 26.0–34.0)
MCHC: 36.4 g/dL — ABNORMAL HIGH (ref 30.0–36.0)
MCV: 82.3 fL (ref 80.0–100.0)
Monocytes Absolute: 0.7 10*3/uL (ref 0.1–1.0)
Monocytes Relative: 15 %
Neutro Abs: 2.6 10*3/uL (ref 1.7–7.7)
Neutrophils Relative %: 57 %
Platelet Count: 139 10*3/uL — ABNORMAL LOW (ref 150–400)
RBC: 4.07 MIL/uL — ABNORMAL LOW (ref 4.22–5.81)
RDW: 14.3 % (ref 11.5–15.5)
WBC Count: 4.5 10*3/uL (ref 4.0–10.5)
nRBC: 0 % (ref 0.0–0.2)

## 2022-03-30 LAB — TSH: TSH: 88.986 u[IU]/mL — ABNORMAL HIGH (ref 0.350–4.500)

## 2022-03-30 MED ORDER — SODIUM CHLORIDE 0.9 % IV SOLN: Freq: Once | INTRAVENOUS | Status: AC

## 2022-03-30 MED ORDER — SODIUM CHLORIDE 0.9 % IV SOLN
200.0000 mg | Freq: Once | INTRAVENOUS | Status: AC
  Administered 2022-03-30: 200 mg via INTRAVENOUS
  Filled 2022-03-30: qty 8

## 2022-03-30 NOTE — Progress Notes (Signed)
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      PRIOR THERAPY: Robotic assisted laparoscopic right radical nephrectomy on June 23, 2021.  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      CURRENT THERAPY: Pembrolizumab 200 mg every 3 weeks started on August 19, 2021.  Status post 10 cycles.  INTERVAL HISTORY: Bryce Cain 72 y.o. male returns to the clinic today for follow-up visit.  The patient is feeling fine today with no concerning complaints except for dry mouth and change of taste.  He denied having any current chest pain, shortness of breath, cough or hemoptysis.  He has no nausea, vomiting, diarrhea or constipation.  He has no headache or visual changes.  He has no fever or chills.  He continues to tolerate his treatment fairly well.  He is here for evaluation before starting cycle #11.  MEDICAL HISTORY: Past Medical History:  Diagnosis Date   CHF (congestive heart failure) (HCC)    DM (diabetes mellitus), type 2 (HCC)    OSA on CPAP     ALLERGIES:  has No Known Allergies.  MEDICATIONS:  Current Outpatient Medications  Medication Sig Dispense Refill   acetaminophen (TYLENOL) 325 MG tablet Take 1-2 tablets (325-650 mg total) by mouth every 4 (four) hours as needed for mild pain.     aspirin EC 81 MG tablet Take 1 tablet (81 mg total) by mouth daily. Swallow whole. 90 tablet 3   empagliflozin (JARDIANCE) 10 MG TABS tablet Take 1 tablet (10 mg total) by mouth daily. 90 tablet 3   isosorbide-hydrALAZINE (BIDIL) 20-37.5 MG tablet TAKE 1 TABLET BY MOUTH THREE TIMES DAILY 90 tablet 6    levothyroxine (SYNTHROID) 50 MCG tablet TAKE 1 TABLET(50 MCG) BY MOUTH DAILY BEFORE BREAKFAST 90 tablet 1   Multiple Vitamin (MULTIVITAMIN) capsule Take 1 capsule by mouth daily.     nystatin (MYCOSTATIN) 100000 UNIT/ML suspension Take 5 mLs (500,000 Units total) by mouth 4 (four) times daily. (Patient not taking: Reported on 03/09/2022) 60 mL 0   Omega-3 Fatty Acids (FISH OIL) 1000 MG CAPS Take 1,000 mg by mouth daily.     Pembrolizumab (KEYTRUDA IV) Inject 1 Dose into the vein every 21 ( twenty-one) days.     potassium chloride (KLOR-CON M) 10 MEQ tablet TAKE 1 TABLET(10 MEQ) BY MOUTH DAILY 90 tablet 0   PREVIDENT 5000 ENAMEL PROTECT 1.1-5 % GEL Place 1 application. onto teeth at bedtime.     Probiotic Product (UP4 PROBIOTICS ADULT PO) Take 1 capsule by mouth daily.     rosuvastatin (CRESTOR) 40 MG tablet Take 1 tablet (40 mg total) by mouth daily. 90 tablet 3   torsemide (DEMADEX) 20 MG tablet TAKE 1 TABLET BY MOUTH DAILY 30 tablet 6   No current facility-administered medications for this visit.    SURGICAL HISTORY:  Past Surgical History:  Procedure Laterality Date   BIV ICD INSERTION CRT-D N/A 12/03/2021   Procedure: BIV ICD INSERTION CRT-D;  Surgeon: Vickie Epley, MD;  Location: [facility];  Service: Cardiovascular;  Laterality: N/A;   RIGHT/LEFT HEART CATH AND CORONARY ANGIOGRAPHY  N/A 02/16/2021   Procedure: RIGHT/LEFT HEART CATH AND CORONARY ANGIOGRAPHY;  Surgeon: Jolaine Artist, MD;  Location: [facility];  Service: Cardiovascular;  Laterality: N/A;   ROBOT ASSISTED LAPAROSCOPIC NEPHRECTOMY Right 06/23/2021   Procedure: XI ROBOTIC ASSISTED LAPAROSCOPIC NEPHRECTOMY;  Surgeon: Ceasar Mons, MD;  Location: [facility];  Service: Urology;  Laterality: Right;    REVIEW OF SYSTEMS:  A comprehensive review of systems was negative except for: Ears, nose, mouth, throat, and face: positive for dry mouth    PHYSICAL EXAMINATION: General appearance: alert,  cooperative, and no distress Head: Normocephalic, without obvious abnormality, atraumatic Neck: no adenopathy, no JVD, supple, symmetrical, trachea midline, and thyroid not enlarged, symmetric, no tenderness/mass/nodules Lymph nodes: Cervical, supraclavicular, and axillary nodes normal. Resp: clear to auscultation bilaterally Back: symmetric, no curvature. ROM normal. No CVA tenderness. Cardio: regular rate and rhythm, S1, S2 normal, no murmur, click, rub or gallop GI: soft, non-tender; bowel sounds normal; no masses,  no organomegaly Extremities: extremities normal, atraumatic, no cyanosis or edema  ECOG PERFORMANCE STATUS: 1 - Symptomatic but completely ambulatory  Blood pressure 99/60, pulse 75, temperature 98.1 F (36.7 C), temperature source Temporal, resp. rate 16, weight 229 lb 8 oz (104.1 kg), SpO2 99 %.  LABORATORY DATA: Lab Results  Component Value Date   WBC 5.3 03/10/2022   HGB 11.4 (L) 03/10/2022   HCT 31.1 (L) 03/10/2022   MCV 81.6 03/10/2022   PLT 143 (L) 03/10/2022      Chemistry      Component Value Date/Time   NA 135 03/10/2022 1308   NA 137 11/23/2021 1145   K 4.5 03/10/2022 1308   CL 103 03/10/2022 1308   CO2 25 03/10/2022 1308   BUN 68 (H) 03/10/2022 1308   BUN 48 (H) 11/23/2021 1145   CREATININE 3.29 (H) 03/10/2022 1308   GLU 98 07/10/2021 0000      Component Value Date/Time   CALCIUM 10.7 (H) 03/10/2022 1308   ALKPHOS 34 (L) 03/10/2022 1308   AST 35 03/10/2022 1308   ALT 16 03/10/2022 1308   BILITOT 0.6 03/10/2022 1308       RADIOGRAPHIC STUDIES: CUP PACEART REMOTE DEVICE CHECK  Result Date: 03/05/2022 Scheduled remote reviewed. Normal device function.  Next remote 91 days. 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   He is a status  post robotic assisted laparoscopic right radical nephrectomy on June 23, 2021.  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 The patient is currently undergoing adjuvant treatment with immunotherapy with Keytruda 200 Mg IV every 3 weeks status post 10 cycles.  He has been tolerating this treatment well except for the dry mouth and lack of taste. I recommended for him to proceed with cycle #11 today as planned. I will see him back for follow-up visit in 3 weeks for evaluation before starting cycle #12 with repeat CT scan of the chest, abdomen and pelvis to rule out any disease recurrence. He was advised to call immediately if he has any concerning symptoms in the interval. The patient voices understanding of current disease status and treatment options and is in agreement with the current care plan.  All questions were answered. The patient knows to  call the clinic with any problems, questions or concerns. We can certainly see the patient much sooner if necessary.  The total time spent in the appointment was 20 minutes.  Disclaimer: This note was dictated with voice recognition software. Similar sounding words can inadvertently be transcribed and may not be corrected upon review.

## 2022-03-30 NOTE — Patient Instructions (Signed)
[facility]  Discharge Instructions: Thank you for choosing [facility] to provide your oncology and hematology care.   If you have a lab appointment with the [facility], please go directly to the [facility] and check in at the registration area.   Wear comfortable clothing and clothing appropriate for easy access to any Portacath or PICC line.   We strive to give you quality time with your provider. You may need to reschedule your appointment if you arrive late (15 or more minutes).  Arriving late affects you and other patients whose appointments are after yours.  Also, if you miss three or more appointments without notifying the office, you may be dismissed from the clinic at the provider's discretion.      For prescription refill requests, have your pharmacy contact our office and allow 72 hours for refills to be completed.    Today you received the following chemotherapy and/or immunotherapy agent: Pembrolizumab (Keytruda)   To help prevent nausea and vomiting after your treatment, we encourage you to take your nausea medication as directed.  BELOW ARE SYMPTOMS THAT SHOULD BE REPORTED IMMEDIATELY: *FEVER GREATER THAN 100.4 F (38 C) OR HIGHER *CHILLS OR SWEATING *NAUSEA AND VOMITING THAT IS NOT CONTROLLED WITH YOUR NAUSEA MEDICATION *UNUSUAL SHORTNESS OF BREATH *UNUSUAL BRUISING OR BLEEDING *URINARY PROBLEMS (pain or burning when urinating, or frequent urination) *BOWEL PROBLEMS (unusual diarrhea, constipation, pain near the anus) TENDERNESS IN MOUTH AND THROAT WITH OR WITHOUT PRESENCE OF ULCERS (sore throat, sores in mouth, or a toothache) UNUSUAL RASH, SWELLING OR PAIN  UNUSUAL VAGINAL DISCHARGE OR ITCHING   Items with * indicate a potential emergency and should be followed up as soon as possible or go to the Emergency Department if any problems should occur.  Please show the CHEMOTHERAPY ALERT CARD or IMMUNOTHERAPY ALERT  CARD at check-in to the Emergency Department and triage nurse.  Should you have questions after your visit or need to cancel or reschedule your appointment, please contact [facility]  Dept: [phone]  and follow the prompts.  Office hours are 8:00 a.m. to 4:30 p.m. Monday - Friday. Please note that voicemails left after 4:00 p.m. may not be returned until the following business day.  We are closed weekends and major holidays. You have access to a nurse at all times for urgent questions. Please call the main number to the clinic Dept: [phone] and follow the prompts.   For any non-urgent questions, you may also contact your provider using MyChart. We now offer e-Visits for anyone 85 and older to request care online for non-urgent symptoms. For details visit [email or web address].   Also download the MyChart app! Go to the app store, search "MyChart", open the app, select [facility], and log in with your MyChart username and password.  Pembrolizumab Injection What is this medication? PEMBROLIZUMAB (PEM broe LIZ ue mab) treats some types of cancer. It works by helping your immune system slow or stop the spread of cancer cells. It is a monoclonal antibody. This medicine may be used for other purposes; ask your health care provider or pharmacist if you have questions. COMMON BRAND NAME(S): Keytruda What should I tell my care team before I take this medication? They need to know if you have any of these conditions: Allogeneic stem cell transplant (uses someone else's stem cells) Autoimmune diseases, such as Crohn disease, ulcerative colitis, lupus History of chest radiation Nervous system problems,  such as Guillain-Barre syndrome, myasthenia gravis Organ transplant An unusual or allergic reaction to pembrolizumab, other medications, foods, dyes, or preservatives Pregnant or trying to get pregnant Breast-feeding How should I use this medication? This  medication is injected into a vein. It is given by your care team in a hospital or clinic setting. A special MedGuide will be given to you before each treatment. Be sure to read this information carefully each time. Talk to your care team about the use of this medication in children. While it may be prescribed for children as young as 6 months for selected conditions, precautions do apply. Overdosage: If you think you have taken too much of this medicine contact a poison control center or emergency room at once. NOTE: This medicine is only for you. Do not share this medicine with others. What if I miss a dose? Keep appointments for follow-up doses. It is important not to miss your dose. Call your care team if you are unable to keep an appointment. What may interact with this medication? Interactions have not been studied. This list may not describe all possible interactions. Give your health care provider a list of all the medicines, herbs, non-prescription drugs, or dietary supplements you use. Also tell them if you smoke, drink alcohol, or use illegal drugs. Some items may interact with your medicine. What should I watch for while using this medication? Your condition will be monitored carefully while you are receiving this medication. You may need blood work while taking this medication. This medication may cause serious skin reactions. They can happen weeks to months after starting the medication. Contact your care team right away if you notice fevers or flu-like symptoms with a rash. The rash may be red or purple and then turn into blisters or peeling of the skin. You may also notice a red rash with swelling of the face, lips, or lymph nodes in your neck or under your arms. Tell your care team right away if you have any change in your eyesight. Talk to your care team if you may be pregnant. Serious birth defects can occur if you take this medication during pregnancy and for 4 months after the last  dose. You will need a negative pregnancy test before starting this medication. Contraception is recommended while taking this medication and for 4 months after the last dose. Your care team can help you find the option that works for you. Do not breastfeed while taking this medication and for 4 months after the last dose. What side effects may I notice from receiving this medication? Side effects that you should report to your care team as soon as possible: Allergic reactions--skin rash, itching, hives, swelling of the face, lips, tongue, or throat Dry cough, shortness of breath or trouble breathing Eye pain, redness, irritation, or discharge with blurry or decreased vision Heart muscle inflammation--unusual weakness or fatigue, shortness of breath, chest pain, fast or irregular heartbeat, dizziness, swelling of the ankles, feet, or hands Hormone gland problems--headache, sensitivity to light, unusual weakness or fatigue, dizziness, fast or irregular heartbeat, increased sensitivity to cold or heat, excessive sweating, constipation, hair loss, increased thirst or amount of urine, tremors or shaking, irritability Infusion reactions--chest pain, shortness of breath or trouble breathing, feeling faint or lightheaded Kidney injury (glomerulonephritis)--decrease in the amount of urine, red or dark brown urine, foamy or bubbly urine, swelling of the ankles, hands, or feet Liver injury--right upper belly pain, loss of appetite, nausea, light-colored stool, dark yellow or brown urine,  yellowing skin or eyes, unusual weakness or fatigue Pain, tingling, or numbness in the hands or feet, muscle weakness, change in vision, confusion or trouble speaking, loss of balance or coordination, trouble walking, seizures Rash, fever, and swollen lymph nodes Redness, blistering, peeling, or loosening of the skin, including inside the mouth Sudden or severe stomach pain, bloody diarrhea, fever, nausea, vomiting Side effects  that usually do not require medical attention (report to your care team if they continue or are bothersome): Bone, joint, or muscle pain Diarrhea Fatigue Loss of appetite Nausea Skin rash This list may not describe all possible side effects. Call your doctor for medical advice about side effects. You may report side effects to FDA at 1-800-FDA-1088. Where should I keep my medication? This medication is given in a hospital or clinic. It will not be stored at home. NOTE: This sheet is a summary. It may not cover all possible information. If you have questions about this medicine, talk to your doctor, pharmacist, or health care provider.  2023 Elsevier/Gold Standard (2021-05-05 00:00:00)

## 2022-03-30 NOTE — Progress Notes (Signed)
Patient seen by MD today  Vitals are within treatment parameters.  Labs reviewed: and are within treatment parameters.  Per physician team, patient is ready for treatment and there are NO modifications to the treatment plan.  

## 2022-03-31 ENCOUNTER — Other Ambulatory Visit: Payer: Medicare PPO

## 2022-03-31 ENCOUNTER — Ambulatory Visit: Payer: Medicare PPO

## 2022-03-31 ENCOUNTER — Ambulatory Visit: Payer: Medicare PPO | Admitting: Internal Medicine

## 2022-04-01 ENCOUNTER — Other Ambulatory Visit: Payer: Self-pay | Admitting: *Deleted

## 2022-04-01 MED ORDER — ROSUVASTATIN CALCIUM 40 MG PO TABS: 40.0000 mg | ORAL_TABLET | Freq: Every day | ORAL | 3 refills | Status: DC

## 2022-04-05 ENCOUNTER — Ambulatory Visit: Payer: Medicare PPO | Admitting: Internal Medicine

## 2022-04-05 ENCOUNTER — Other Ambulatory Visit: Payer: Medicare PPO

## 2022-04-05 ENCOUNTER — Ambulatory Visit: Payer: Medicare PPO

## 2022-04-06 NOTE — Telephone Encounter (Signed)
Encounter opened in error

## 2022-04-07 ENCOUNTER — Other Ambulatory Visit: Payer: Medicare PPO

## 2022-04-07 ENCOUNTER — Ambulatory Visit: Payer: Medicare PPO

## 2022-04-07 ENCOUNTER — Ambulatory Visit: Payer: Medicare PPO | Admitting: Physician Assistant

## 2022-04-13 ENCOUNTER — Ambulatory Visit (HOSPITAL_COMMUNITY): Payer: Medicare PPO | Attending: Cardiology

## 2022-04-13 DIAGNOSIS — Z95810 Presence of automatic (implantable) cardiac defibrillator: Secondary | ICD-10-CM

## 2022-04-13 DIAGNOSIS — I5022 Chronic systolic (congestive) heart failure: Secondary | ICD-10-CM

## 2022-04-13 DIAGNOSIS — I447 Left bundle-branch block, unspecified: Secondary | ICD-10-CM

## 2022-04-14 LAB — ECHOCARDIOGRAM COMPLETE
Area-P 1/2: 5.23 cm2
Est EF: <20
P 1/2 time: 483 msec
S' Lateral: 5.45 cm

## 2022-04-19 ENCOUNTER — Other Ambulatory Visit: Payer: Medicare PPO

## 2022-04-19 ENCOUNTER — Telehealth: Payer: Self-pay | Admitting: Internal Medicine

## 2022-04-19 ENCOUNTER — Ambulatory Visit: Payer: Medicare PPO | Admitting: Internal Medicine

## 2022-04-19 ENCOUNTER — Ambulatory Visit: Payer: Medicare PPO

## 2022-04-19 NOTE — Progress Notes (Unsigned)
[facility] OFFICE PROGRESS NOTE  Philip Aspen, [name], MD [address] [place] [address]  DIAGNOSIS: Kidney cancer diagnosed in June 2023.  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    PRIOR THERAPY: Robotic assisted laparoscopic right radical nephrectomy on June 23, 2021.  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     CURRENT THERAPY: Pembrolizumab 200 mg every 3 weeks started on August 19, 2021.  He is here for cycle 11 to complete one year in total 17 cycles.   INTERVAL HISTORY: MONTEGO SEEDORF 72 y.o. male returns to the clinic today for a follow-up visit.  The patient is currently being treated with immunotherapy for the patient's history of renal cell carcinoma.  The patient is status post 11 cycles and has been tolerating it fair except for dry mouth, taste alterations, and thyroid dysfunction. At prior appointments, we recommended biotene, nystatin, salt water rinses, and OTC saliva substitutes. He did try all of these recommendations without appreciable improvement. He tries to drink a lot of water.   He sees Dr. Sande Brothers from urology.  He believes he may have missed a call from their office. He is not sure what this was regarding. He does not have a nephrologist for his CKD. I had referred him to nephrology on 03/10/22. He states he did not get a call from nephrology.   Today he denies any changes in his health.  Denies any fever, chills, night sweats, unexplained weight loss.  He reports good appetite. Denies any chest pain, shortness of breath, cough, or hemoptysis.  Denies any new bone pain.  Denies any nausea, vomiting, diarrhea, or constipation.  Denies any dysuria, foul smelling urine, or blood in the urine.  He does urinate frequently since he drinks so much water for his dry mouth. He denies any prohibitive myalgias  and arthralgias.  The patient was recently started on Synthroid for hypothyroidism secondary to his immunotherapy.  He was supposed to have a restaging CT scan prior to his appointment today; however, this is not scheduled for 04/27/22.  He is here today for evaluation and repeat blood work before undergoing cycle #12.      MEDICAL HISTORY: Past Medical History:  Diagnosis Date   CHF (congestive heart failure) (HCC)    DM (diabetes mellitus), type 2 (HCC)    OSA on CPAP     ALLERGIES:  has No Known Allergies.  MEDICATIONS:  Current Outpatient Medications  Medication Sig Dispense Refill   acetaminophen (TYLENOL) 325 MG tablet Take 1-2 tablets (325-650 mg total) by mouth every 4 (four) hours as needed for mild pain.     aspirin EC 81 MG tablet Take 1 tablet (81 mg total) by mouth daily. Swallow whole. 90 tablet 3   empagliflozin (JARDIANCE) 10 MG TABS tablet Take 1 tablet (10 mg total) by mouth daily. 90 tablet 3   isosorbide-hydrALAZINE (BIDIL) 20-37.5 MG tablet TAKE 1 TABLET BY MOUTH THREE TIMES DAILY 90 tablet 6   levothyroxine (SYNTHROID) 50 MCG tablet TAKE 1 TABLET(50 MCG) BY MOUTH DAILY BEFORE BREAKFAST 90 tablet 1   Multiple Vitamin (MULTIVITAMIN) capsule Take 1 capsule by mouth daily.     nystatin (MYCOSTATIN) 100000 UNIT/ML suspension Take 5 mLs (500,000 Units total) by mouth 4 (four) times daily. (Patient not taking: Reported on 03/09/2022) 60 mL 0   Omega-3 Fatty Acids (  FISH OIL) 1000 MG CAPS Take 1,000 mg by mouth daily.     Pembrolizumab (KEYTRUDA IV) Inject 1 Dose into the vein every 21 ( twenty-one) days.     potassium chloride (KLOR-CON M) 10 MEQ tablet TAKE 1 TABLET(10 MEQ) BY MOUTH DAILY 90 tablet 0   PREVIDENT 5000 ENAMEL PROTECT 1.1-5 % GEL Place 1 application. onto teeth at bedtime.     Probiotic Product (UP4 PROBIOTICS ADULT PO) Take 1 capsule by mouth daily.     rosuvastatin (CRESTOR) 40 MG tablet Take 1 tablet (40 mg total) by mouth daily. 90 tablet 3   torsemide  (DEMADEX) 20 MG tablet TAKE 1 TABLET BY MOUTH DAILY 30 tablet 6   No current facility-administered medications for this visit.    SURGICAL HISTORY:  Past Surgical History:  Procedure Laterality Date   BIV ICD INSERTION CRT-D N/A 12/03/2021   Procedure: BIV ICD INSERTION CRT-D;  Surgeon: Lanier Prude, MD;  Location: [facility] INVASIVE CV LAB;  Service: Cardiovascular;  Laterality: N/A;   RIGHT/LEFT HEART CATH AND CORONARY ANGIOGRAPHY N/A 02/16/2021   Procedure: RIGHT/LEFT HEART CATH AND CORONARY ANGIOGRAPHY;  Surgeon: Dolores Patty, MD;  Location: [facility] INVASIVE CV LAB;  Service: Cardiovascular;  Laterality: N/A;   ROBOT ASSISTED LAPAROSCOPIC NEPHRECTOMY Right 06/23/2021   Procedure: XI ROBOTIC ASSISTED LAPAROSCOPIC NEPHRECTOMY;  Surgeon: Rene Paci, MD;  Location: [facility] ORS;  Service: Urology;  Laterality: Right;    REVIEW OF SYSTEMS:   Constitutional: Negative for appetite change, chills, fatigue, fever and unexpected weight change.  HENT: Positive for dry mouth and taste alterations. Negative for mouth sores, nosebleeds, sore throat and trouble swallowing.   Eyes: Negative for eye problems and icterus.  Respiratory: Negative for cough, hemoptysis, shortness of breath and wheezing.   Cardiovascular: Negative for chest pain and leg swelling.  Gastrointestinal: Negative for abdominal pain, constipation, diarrhea, nausea and vomiting.  Genitourinary: Negative for bladder incontinence, difficulty urinating, dysuria, frequency and hematuria.   Musculoskeletal: Negative for back pain, gait problem, neck pain and neck stiffness.  Skin: Negative for itching and rash.  Neurological: Negative for dizziness, extremity weakness, gait problem, headaches, light-headedness and seizures.  Hematological: Negative for adenopathy. Does not bruise/bleed easily.  Psychiatric/Behavioral: Negative for confusion, depression and sleep disturbance. The patient is not nervous/anxious.     PHYSICAL  EXAMINATION:  Blood pressure 98/63, pulse 71, temperature 98.3 F (36.8 C), temperature source Oral, resp. rate 14, height  (1.778 m), weight 231 lb (104.8 kg), SpO2 96 %.  ECOG PERFORMANCE STATUS: 1  Physical Exam  Constitutional: Oriented to person, place, and time and well-developed, well-nourished, and in no distress.  HENT:  Head: Normocephalic and atraumatic.  Mouth/Throat: Oropharynx has some thrush. Dry appearing. No oropharyngeal exudate.  Eyes: Conjunctivae are normal. Right eye exhibits no discharge. Left eye exhibits no discharge. No scleral icterus.  Neck: Normal range of motion. Neck supple.  Cardiovascular: Normal rate, regular rhythm, normal heart sounds and intact distal pulses.   Pulmonary/Chest: Effort normal and breath sounds normal. No respiratory distress. No wheezes. No rales.  Abdominal: Soft. Bowel sounds are normal. Exhibits no distension and no mass. There is no tenderness.  Musculoskeletal: Normal range of motion. Exhibits no edema.  Lymphadenopathy:    No cervical adenopathy.  Neurological: Alert and oriented to person, place, and time. Exhibits normal muscle tone. Gait normal. Coordination normal.  Skin: Skin is warm and dry. No rash noted. Not diaphoretic. No erythema. No pallor.  Psychiatric: Mood, memory and judgment  normal.  Vitals reviewed.  LABORATORY DATA: Lab Results  Component Value Date   WBC 5.3 04/21/2022   HGB 12.3 (L) 04/21/2022   HCT 33.8 (L) 04/21/2022   MCV 81.3 04/21/2022   PLT 143 (L) 04/21/2022      Chemistry      Component Value Date/Time   NA 135 03/30/2022 1342   NA 137 11/23/2021 1145   K 4.4 03/30/2022 1342   CL 104 03/30/2022 1342   CO2 24 03/30/2022 1342   BUN 61 (H) 03/30/2022 1342   BUN 48 (H) 11/23/2021 1145   CREATININE 2.90 (H) 03/30/2022 1342   GLU 98 07/10/2021 0000      Component Value Date/Time   CALCIUM 10.4 (H) 03/30/2022 1342   ALKPHOS 34 (L) 03/30/2022 1342   AST 35 03/30/2022 1342   ALT 18  03/30/2022 1342   BILITOT 0.7 03/30/2022 1342       RADIOGRAPHIC STUDIES:  ECHOCARDIOGRAM COMPLETE  Result Date: 04/14/2022    ECHOCARDIOGRAM REPORT   Patient Name:   HERVEY WEDIG  Date of Exam: 04/13/2022 Medical Rec #:  [id number]     Height:       70.0 in Accession #:    [id number]    Weight:       229.5 lb Date of Birth:  1950-01-21     BSA:          2.213 m Patient Age:    71 years      BP:           120/68 mmHg Patient Gender: M             HR:           89 bpm. Exam Location:  [address] Procedure: 2D Echo, Cardiac Doppler and Color Doppler Indications:    I50.22 CHF  History:        Patient has prior history of Echocardiogram examinations, most                 recent 05/29/2021. CHF, Defibrillator, Arrythmias:LBBB; Risk                 Factors:Hypertension, Dyslipidemia, Diabetes and Former Smoker.  Sonographer:    Samule Ohm RDCS Referring Phys: [id number] Lanier Prude  Sonographer Comments: Technically difficult study due to poor echo windows and patient is obese. Image acquisition challenging due to patient body habitus. IMPRESSIONS  1. Left ventricular ejection fraction, by estimation, is <20%. The left ventricle has severely decreased function. The left ventricle demonstrates global hypokinesis. The left ventricular internal cavity size was severely dilated. There is moderate concentric left ventricular hypertrophy. Left ventricular diastolic function could not be evaluated.  2. Right ventricular systolic function was not well visualized. The right ventricular size is not well visualized.  3. Left atrial size was mild to moderately dilated.  4. The mitral valve is normal in structure. Trivial mitral valve regurgitation. No evidence of mitral stenosis.  5. The aortic valve was not well visualized. Aortic valve regurgitation is mild. Aortic valve sclerosis is present, with no evidence of aortic valve stenosis. Aortic regurgitation PHT measures 483 msec. Comparison(s): Prior images reviewed  side by side. EF remains severely reduced, but LV diameter slightly improved, apical wall motion improved. FINDINGS  Left Ventricle: Left ventricular ejection fraction, by estimation, is <20%. The left ventricle has severely decreased function. The left ventricle demonstrates global hypokinesis. The left ventricular internal cavity size was severely dilated. There is moderate concentric left  ventricular hypertrophy. Left ventricular diastolic function could not be evaluated due to paced rhythm. Left ventricular diastolic function could not be evaluated. Right Ventricle: The right ventricular size is not well visualized. Right vetricular wall thickness was not well visualized. Right ventricular systolic function was not well visualized. Left Atrium: Left atrial size was mild to moderately dilated. Right Atrium: Right atrial size was not well visualized. Pericardium: There is no evidence of pericardial effusion. Mitral Valve: The mitral valve is normal in structure. Trivial mitral valve regurgitation. No evidence of mitral valve stenosis. Tricuspid Valve: The tricuspid valve is not well visualized. Tricuspid valve regurgitation is not demonstrated. Aortic Valve: The aortic valve was not well visualized. Aortic valve regurgitation is mild. Aortic regurgitation PHT measures 483 msec. Aortic valve sclerosis is present, with no evidence of aortic valve stenosis. Pulmonic Valve: The pulmonic valve was not well visualized. Pulmonic valve regurgitation is mild. No evidence of pulmonic stenosis. Aorta: The aortic root and ascending aorta are structurally normal, with no evidence of dilitation. Venous: The inferior vena cava was not well visualized. IAS/Shunts: The interatrial septum was not well visualized. Additional Comments: A device lead is visualized.  LEFT VENTRICLE PLAX 2D LVIDd:         6.30 cm   Diastology LVIDs:         5.45 cm   LV e' medial:    3.81 cm/s LV PW:         1.30 cm   LV E/e' medial:  29.9 LV IVS:         1.35 cm   LV e' lateral:   8.92 cm/s LVOT diam:     2.10 cm   LV E/e' lateral: 12.8 LV SV:         34 LV SV Index:   15 LVOT Area:     3.46 cm  LEFT ATRIUM             Index        RIGHT ATRIUM LA diam:        3.90 cm 1.76 cm/m   RA Pressure: 3.00 mmHg LA Vol (A2C):   72.9 ml 32.94 ml/m LA Vol (A4C):   64.6 ml 29.19 ml/m LA Biplane Vol: 68.7 ml 31.04 ml/m  AORTIC VALVE LVOT Vmax:   64.70 cm/s LVOT Vmean:  44.400 cm/s LVOT VTI:    0.098 m AI PHT:      483 msec  AORTA Ao Asc diam: 3.60 cm MITRAL VALVE                TRICUSPID VALVE MV Area (PHT): 5.23 cm     Estimated RAP:  3.00 mmHg MV Decel Time: 145 msec MV E velocity: 114.00 cm/s  SHUNTS                             Systemic VTI:  0.10 m                             Systemic Diam: 2.10 cm Jodelle Red MD Electronically signed by Jodelle Red MD Signature Date/Time: 04/14/2022/8:03:46 PM    Final      ASSESSMENT/PLAN:  This is a very pleasant 72 year old African American male diagnosed with right sided renal cell carcinoma in June 2023.  He was found to have a T3a clear-cell without any evidence of metastatic disease.   He underwent robotic assisted laparoscopic right  radical nephrectomy on 06/23/21.   He is currently on Keytruda 200 mg IV every 3 weeks.  He is tolerating this well.  He is status post 11 cycles.  The plan is to complete 1 year of treatment which will be 17 cycles.  Labs were reviewed.  Recommend that he proceed with cycle #12 today as schedule.  The patient has CKD.  Okay to treat with a creatinine of 3.19 today. He was previously referred to nephrology. He does not have an upcoming appointment. We will follow up on this referral. His calcium is a little high. We will instruct him to hold his multivitamin.   He has hypotension due to his CHF. His BP is stable today. He is ok to treat with his BP of 98/63.    He will continue taking Synthroid for his hypothyroidism.   We will see him back for follow-up visit in 3  weeks for evaluation repeat blood work before undergoing cycle #12.  For his taste alterations, He was also encouraged to do salt water rinses, biotene, and make sure he brushes his tongue. Discussed OTC products for dry mouth such as salivasure or other saliva substitutes. I also recommended he pick up nystatin.    He is scheduled for CT scan on 04/27/22. He was advised to keep this appointment as scheduled.   If he missed a call from Dr. Sande Brothers office, I instructed him to call back to make sure he does not miss an appointment.   The patient was advised to call immediately if she has any concerning symptoms in the interval. The patient voices understanding of current disease status and treatment options and is in agreement with the current care plan. All questions were answered. The patient knows to call the clinic with any problems, questions or concerns. We can certainly see the patient much sooner if necessary    No orders of the defined types were placed in this encounter.    The total time spent in the appointment was 20-29 minutes  Alonnah Lampkins L Kaedin Hicklin, PA-C 04/21/22

## 2022-04-20 ENCOUNTER — Telehealth: Payer: Self-pay | Admitting: Internal Medicine

## 2022-04-20 NOTE — Telephone Encounter (Signed)
Called patient regarding upcoming appointments, patient is notified. 

## 2022-04-21 ENCOUNTER — Ambulatory Visit: Payer: Medicare PPO | Admitting: Physician Assistant

## 2022-04-21 ENCOUNTER — Inpatient Hospital Stay: Payer: Medicare PPO

## 2022-04-21 ENCOUNTER — Ambulatory Visit: Payer: Medicare PPO

## 2022-04-21 ENCOUNTER — Other Ambulatory Visit: Payer: Medicare PPO

## 2022-04-21 ENCOUNTER — Ambulatory Visit: Payer: Medicare PPO | Admitting: Internal Medicine

## 2022-04-21 ENCOUNTER — Inpatient Hospital Stay: Payer: Medicare PPO | Admitting: Physician Assistant

## 2022-04-21 ENCOUNTER — Other Ambulatory Visit: Payer: Self-pay

## 2022-04-21 ENCOUNTER — Inpatient Hospital Stay: Payer: Medicare PPO | Admitting: Internal Medicine

## 2022-04-21 ENCOUNTER — Inpatient Hospital Stay: Payer: Medicare PPO | Attending: Internal Medicine

## 2022-04-21 VITALS — BP 98/63 | HR 71 | Temp 98.3°F | Resp 14 | Ht 70.0 in | Wt 231.0 lb

## 2022-04-21 DIAGNOSIS — Z905 Acquired absence of kidney: Secondary | ICD-10-CM | POA: Diagnosis not present

## 2022-04-21 DIAGNOSIS — T451X5A Adverse effect of antineoplastic and immunosuppressive drugs, initial encounter: Secondary | ICD-10-CM | POA: Insufficient documentation

## 2022-04-21 DIAGNOSIS — R682 Dry mouth, unspecified: Secondary | ICD-10-CM | POA: Diagnosis not present

## 2022-04-21 DIAGNOSIS — C641 Malignant neoplasm of right kidney, except renal pelvis: Secondary | ICD-10-CM

## 2022-04-21 DIAGNOSIS — Z79890 Hormone replacement therapy (postmenopausal): Secondary | ICD-10-CM | POA: Insufficient documentation

## 2022-04-21 DIAGNOSIS — N189 Chronic kidney disease, unspecified: Secondary | ICD-10-CM | POA: Diagnosis not present

## 2022-04-21 DIAGNOSIS — Z79620 Long term (current) use of immunosuppressive biologic: Secondary | ICD-10-CM | POA: Diagnosis not present

## 2022-04-21 DIAGNOSIS — Z5112 Encounter for antineoplastic immunotherapy: Secondary | ICD-10-CM | POA: Diagnosis not present

## 2022-04-21 DIAGNOSIS — E032 Hypothyroidism due to medicaments and other exogenous substances: Secondary | ICD-10-CM | POA: Diagnosis not present

## 2022-04-21 DIAGNOSIS — I509 Heart failure, unspecified: Secondary | ICD-10-CM | POA: Insufficient documentation

## 2022-04-21 DIAGNOSIS — I959 Hypotension, unspecified: Secondary | ICD-10-CM | POA: Insufficient documentation

## 2022-04-21 LAB — CBC WITH DIFFERENTIAL (CANCER CENTER ONLY)
Abs Immature Granulocytes: 0.01 10*3/uL (ref 0.00–0.07)
Basophils Absolute: 0.1 10*3/uL (ref 0.0–0.1)
Basophils Relative: 1 %
Eosinophils Absolute: 0.2 10*3/uL (ref 0.0–0.5)
Eosinophils Relative: 4 %
HCT: 33.8 % — ABNORMAL LOW (ref 39.0–52.0)
Hemoglobin: 12.3 g/dL — ABNORMAL LOW (ref 13.0–17.0)
Immature Granulocytes: 0 %
Lymphocytes Relative: 16 %
Lymphs Abs: 0.9 10*3/uL (ref 0.7–4.0)
MCH: 29.6 pg (ref 26.0–34.0)
MCHC: 36.4 g/dL — ABNORMAL HIGH (ref 30.0–36.0)
MCV: 81.3 fL (ref 80.0–100.0)
Monocytes Absolute: 0.7 10*3/uL (ref 0.1–1.0)
Monocytes Relative: 14 %
Neutro Abs: 3.4 10*3/uL (ref 1.7–7.7)
Neutrophils Relative %: 65 %
Platelet Count: 143 10*3/uL — ABNORMAL LOW (ref 150–400)
RBC: 4.16 MIL/uL — ABNORMAL LOW (ref 4.22–5.81)
RDW: 14 % (ref 11.5–15.5)
WBC Count: 5.3 10*3/uL (ref 4.0–10.5)
nRBC: 0 % (ref 0.0–0.2)

## 2022-04-21 LAB — CMP (CANCER CENTER ONLY)
ALT: 17 U/L (ref 0–44)
AST: 34 U/L (ref 15–41)
Albumin: 4.5 g/dL (ref 3.5–5.0)
Alkaline Phosphatase: 35 U/L — ABNORMAL LOW (ref 38–126)
Anion gap: 7 (ref 5–15)
BUN: 73 mg/dL — ABNORMAL HIGH (ref 8–23)
CO2: 25 mmol/L (ref 22–32)
Calcium: 10.8 mg/dL — ABNORMAL HIGH (ref 8.9–10.3)
Chloride: 103 mmol/L (ref 98–111)
Creatinine: 3.19 mg/dL — ABNORMAL HIGH (ref 0.61–1.24)
GFR, Estimated: 20 mL/min — ABNORMAL LOW (ref >60)
Glucose, Bld: 98 mg/dL (ref 70–99)
Potassium: 4.3 mmol/L (ref 3.5–5.1)
Sodium: 135 mmol/L (ref 135–145)
Total Bilirubin: 0.6 mg/dL (ref 0.3–1.2)
Total Protein: 8.2 g/dL — ABNORMAL HIGH (ref 6.5–8.1)

## 2022-04-21 LAB — TSH: TSH: 92.053 u[IU]/mL — ABNORMAL HIGH (ref 0.350–4.500)

## 2022-04-21 MED ORDER — SODIUM CHLORIDE 0.9 % IV SOLN: Freq: Once | INTRAVENOUS | Status: AC

## 2022-04-21 MED ORDER — SODIUM CHLORIDE 0.9 % IV SOLN
200.0000 mg | Freq: Once | INTRAVENOUS | Status: AC
  Administered 2022-04-21: 200 mg via INTRAVENOUS
  Filled 2022-04-21: qty 200

## 2022-04-21 NOTE — Progress Notes (Signed)
Per Thayil PA, ok to treat today with SCR 3.19.

## 2022-04-22 NOTE — Progress Notes (Signed)
This nurse faxed new patient referral, office notes, labs  and patient demographics to [facility] per provider request.  No further concerns noted at this time.

## 2022-04-23 LAB — T4: T4, Total: 4.5 ug/dL (ref 4.5–12.0)

## 2022-04-27 ENCOUNTER — Ambulatory Visit (HOSPITAL_COMMUNITY)
Admission: RE | Admit: 2022-04-27 | Discharge: 2022-04-27 | Disposition: A | Discharge status: Home / Self Care | Payer: Medicare PPO | Source: Ambulatory Visit | Attending: Internal Medicine | Admitting: Internal Medicine

## 2022-04-27 DIAGNOSIS — C641 Malignant neoplasm of right kidney, except renal pelvis: Secondary | ICD-10-CM | POA: Diagnosis not present

## 2022-04-27 DIAGNOSIS — C649 Malignant neoplasm of unspecified kidney, except renal pelvis: Secondary | ICD-10-CM | POA: Diagnosis not present

## 2022-04-27 DIAGNOSIS — J479 Bronchiectasis, uncomplicated: Secondary | ICD-10-CM | POA: Diagnosis not present

## 2022-04-27 DIAGNOSIS — K429 Umbilical hernia without obstruction or gangrene: Secondary | ICD-10-CM | POA: Diagnosis not present

## 2022-05-05 DIAGNOSIS — N184 Chronic kidney disease, stage 4 (severe): Secondary | ICD-10-CM | POA: Diagnosis not present

## 2022-05-05 DIAGNOSIS — C641 Malignant neoplasm of right kidney, except renal pelvis: Secondary | ICD-10-CM | POA: Diagnosis not present

## 2022-05-05 DIAGNOSIS — E1122 Type 2 diabetes mellitus with diabetic chronic kidney disease: Secondary | ICD-10-CM | POA: Diagnosis not present

## 2022-05-05 DIAGNOSIS — I130 Hypertensive heart and chronic kidney disease with heart failure and stage 1 through stage 4 chronic kidney disease, or unspecified chronic kidney disease: Secondary | ICD-10-CM | POA: Diagnosis not present

## 2022-05-05 DIAGNOSIS — I5022 Chronic systolic (congestive) heart failure: Secondary | ICD-10-CM | POA: Diagnosis not present

## 2022-05-10 LAB — CBC AND DIFFERENTIAL
HCT: 36 — AB (ref 41–53)
Hemoglobin: 11.9 — AB (ref 13.5–17.5)
WBC: 4.6

## 2022-05-10 LAB — BASIC METABOLIC PANEL
BUN: 66 — AB (ref 4–21)
CO2: 18 (ref 13–22)
Chloride: 100 (ref 99–108)
Creatinine: 2.9 — AB (ref 0.6–1.3)
Glucose: 94
Potassium: 4.6 mEq/L (ref 3.5–5.1)
Sodium: 137 (ref 137–147)

## 2022-05-10 LAB — VITAMIN D 25 HYDROXY (VIT D DEFICIENCY, FRACTURES): Vit D, 25-Hydroxy: 45.6

## 2022-05-10 LAB — COMPREHENSIVE METABOLIC PANEL
Albumin: 4.5 (ref 3.5–5.0)
Calcium: 10.1 (ref 8.7–10.7)

## 2022-05-10 LAB — CBC: RBC: 4.13 (ref 3.87–5.11)

## 2022-05-11 ENCOUNTER — Telehealth: Payer: Self-pay

## 2022-05-11 NOTE — Telephone Encounter (Signed)
LVM instructions for pt to call office to schedule AWV with PCP.

## 2022-05-11 NOTE — Telephone Encounter (Signed)
Pt has been sch for 06-01-2022

## 2022-05-12 ENCOUNTER — Inpatient Hospital Stay (HOSPITAL_BASED_OUTPATIENT_CLINIC_OR_DEPARTMENT_OTHER): Payer: Medicare PPO | Admitting: Internal Medicine

## 2022-05-12 ENCOUNTER — Inpatient Hospital Stay: Payer: Medicare PPO

## 2022-05-12 ENCOUNTER — Inpatient Hospital Stay: Payer: Medicare PPO | Attending: Internal Medicine

## 2022-05-12 VITALS — BP 106/73 | HR 65 | Temp 98.1°F | Resp 17

## 2022-05-12 DIAGNOSIS — Z905 Acquired absence of kidney: Secondary | ICD-10-CM | POA: Insufficient documentation

## 2022-05-12 DIAGNOSIS — C641 Malignant neoplasm of right kidney, except renal pelvis: Secondary | ICD-10-CM

## 2022-05-12 DIAGNOSIS — Z5112 Encounter for antineoplastic immunotherapy: Secondary | ICD-10-CM | POA: Insufficient documentation

## 2022-05-12 LAB — CBC WITH DIFFERENTIAL (CANCER CENTER ONLY)
Abs Immature Granulocytes: 0.02 10*3/uL (ref 0.00–0.07)
Basophils Absolute: 0.1 10*3/uL (ref 0.0–0.1)
Basophils Relative: 1 %
Eosinophils Absolute: 0.3 10*3/uL (ref 0.0–0.5)
Eosinophils Relative: 6 %
HCT: 32.6 % — ABNORMAL LOW (ref 39.0–52.0)
Hemoglobin: 12 g/dL — ABNORMAL LOW (ref 13.0–17.0)
Immature Granulocytes: 0 %
Lymphocytes Relative: 21 %
Lymphs Abs: 1 10*3/uL (ref 0.7–4.0)
MCH: 30.1 pg (ref 26.0–34.0)
MCHC: 36.8 g/dL — ABNORMAL HIGH (ref 30.0–36.0)
MCV: 81.7 fL (ref 80.0–100.0)
Monocytes Absolute: 0.7 10*3/uL (ref 0.1–1.0)
Monocytes Relative: 15 %
Neutro Abs: 2.7 10*3/uL (ref 1.7–7.7)
Neutrophils Relative %: 57 %
Platelet Count: 116 10*3/uL — ABNORMAL LOW (ref 150–400)
RBC: 3.99 MIL/uL — ABNORMAL LOW (ref 4.22–5.81)
RDW: 13.6 % (ref 11.5–15.5)
WBC Count: 4.8 10*3/uL (ref 4.0–10.5)
nRBC: 0 % (ref 0.0–0.2)

## 2022-05-12 LAB — CMP (CANCER CENTER ONLY)
ALT: 15 U/L (ref 0–44)
AST: 32 U/L (ref 15–41)
Albumin: 4.3 g/dL (ref 3.5–5.0)
Alkaline Phosphatase: 34 U/L — ABNORMAL LOW (ref 38–126)
Anion gap: 6 (ref 5–15)
BUN: 62 mg/dL — ABNORMAL HIGH (ref 8–23)
CO2: 23 mmol/L (ref 22–32)
Calcium: 9.8 mg/dL (ref 8.9–10.3)
Chloride: 106 mmol/L (ref 98–111)
Creatinine: 3.02 mg/dL — ABNORMAL HIGH (ref 0.61–1.24)
GFR, Estimated: 21 mL/min — ABNORMAL LOW (ref >60)
Glucose, Bld: 127 mg/dL — ABNORMAL HIGH (ref 70–99)
Potassium: 4.2 mmol/L (ref 3.5–5.1)
Sodium: 135 mmol/L (ref 135–145)
Total Bilirubin: 0.6 mg/dL (ref 0.3–1.2)
Total Protein: 7.5 g/dL (ref 6.5–8.1)

## 2022-05-12 MED ORDER — SODIUM CHLORIDE 0.9% FLUSH: 10.0000 mL | INTRAVENOUS | Status: DC | PRN

## 2022-05-12 MED ORDER — SODIUM CHLORIDE 0.9 % IV SOLN
200.0000 mg | Freq: Once | INTRAVENOUS | Status: AC
  Administered 2022-05-12: 200 mg via INTRAVENOUS
  Filled 2022-05-12: qty 200

## 2022-05-12 MED ORDER — HEPARIN SOD (PORK) LOCK FLUSH 100 UNIT/ML IV SOLN: 500.0000 [IU] | Freq: Once | INTRAVENOUS | Status: DC | PRN

## 2022-05-12 MED ORDER — SODIUM CHLORIDE 0.9 % IV SOLN: Freq: Once | INTRAVENOUS | Status: AC

## 2022-05-12 NOTE — Progress Notes (Signed)
Patient seen by Dr. Gypsy Balsam are within treatment parameters.  Labs reviewed: and are not all within treatment parameters.   Scr 3.02  per provider patient is ok to treat.   Per physician team, patient is ready for treatment and there are NO modifications to the treatment plan.

## 2022-05-12 NOTE — Patient Instructions (Signed)
[facility]  Discharge Instructions: Thank you for choosing [facility] to provide your oncology and hematology care.   If you have a lab appointment with the Cancer Center, please go directly to the Cancer Center and check in at the registration area.   Wear comfortable clothing and clothing appropriate for easy access to any Portacath or PICC line.   We strive to give you quality time with your provider. You may need to reschedule your appointment if you arrive late (15 or more minutes).  Arriving late affects you and other patients whose appointments are after yours.  Also, if you miss three or more appointments without notifying the office, you may be dismissed from the clinic at the provider's discretion.      For prescription refill requests, have your pharmacy contact our office and allow 72 hours for refills to be completed.    Today you received the following chemotherapy and/or immunotherapy agents: Keytruda      To help prevent nausea and vomiting after your treatment, we encourage you to take your nausea medication as directed.  BELOW ARE SYMPTOMS THAT SHOULD BE REPORTED IMMEDIATELY: *FEVER GREATER THAN 100.4 F (38 C) OR HIGHER *CHILLS OR SWEATING *NAUSEA AND VOMITING THAT IS NOT CONTROLLED WITH YOUR NAUSEA MEDICATION *UNUSUAL SHORTNESS OF BREATH *UNUSUAL BRUISING OR BLEEDING *URINARY PROBLEMS (pain or burning when urinating, or frequent urination) *BOWEL PROBLEMS (unusual diarrhea, constipation, pain near the anus) TENDERNESS IN MOUTH AND THROAT WITH OR WITHOUT PRESENCE OF ULCERS (sore throat, sores in mouth, or a toothache) UNUSUAL RASH, SWELLING OR PAIN  UNUSUAL VAGINAL DISCHARGE OR ITCHING   Items with * indicate a potential emergency and should be followed up as soon as possible or go to the Emergency Department if any problems should occur.  Please show the CHEMOTHERAPY ALERT CARD or IMMUNOTHERAPY ALERT CARD at  check-in to the Emergency Department and triage nurse.  Should you have questions after your visit or need to cancel or reschedule your appointment, please contact [facility]  Dept: [phone]  and follow the prompts.  Office hours are 8:00 a.m. to 4:30 p.m. Monday - Friday. Please note that voicemails left after 4:00 p.m. may not be returned until the following business day.  We are closed weekends and major holidays. You have access to a nurse at all times for urgent questions. Please call the main number to the clinic Dept: [phone] and follow the prompts.   For any non-urgent questions, you may also contact your provider using MyChart. We now offer e-Visits for anyone 18 and older to request care online for non-urgent symptoms. For details visit [email or web address].   Also download the MyChart app! Go to the app store, search "MyChart", open the app, select [facility], and log in with your MyChart username and password.   

## 2022-05-12 NOTE — Progress Notes (Signed)
[facility] [phone]   Fax:([phone]  OFFICE PROGRESS NOTE  Philip Aspen, [name], MD [address] 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      PRIOR THERAPY: Robotic assisted laparoscopic right radical nephrectomy on June 23, 2021.  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      CURRENT THERAPY: Pembrolizumab 200 mg every 3 weeks started on August 19, 2021.  Status post 12 cycles.  INTERVAL HISTORY: CHAEL Cain 72 y.o. male returns to the clinic today for follow-up visit.  The patient is feeling fine today with no concerning complaints.  He denied having any chest pain, shortness of breath except with exertion with no cough or hemoptysis.  He has no nausea, vomiting, diarrhea or constipation.  He has no headache or visual changes.  He continues to have runny nose and dry mouth.  He denied having any fever or chills.  He has no recent weight loss or night sweats.  He has been tolerating his treatment with Keytruda fairly well.  He had repeat CT scan of the chest, abdomen and pelvis performed recently and he is here for evaluation and discussion of his scan results.  MEDICAL HISTORY: Past Medical History:  Diagnosis Date   CHF (congestive heart failure) (HCC)    DM (diabetes mellitus), type 2 (HCC)    OSA on CPAP     ALLERGIES:  has No Known Allergies.  MEDICATIONS:  Current Outpatient Medications  Medication Sig Dispense Refill   acetaminophen (TYLENOL) 325 MG tablet Take 1-2 tablets (325-650 mg total) by mouth every 4 (four) hours as needed for mild pain.     aspirin EC 81 MG tablet Take 1 tablet (81 mg total) by mouth daily. Swallow whole. 90 tablet 3   empagliflozin (JARDIANCE) 10 MG  TABS tablet Take 1 tablet (10 mg total) by mouth daily. 90 tablet 3   isosorbide-hydrALAZINE (BIDIL) 20-37.5 MG tablet TAKE 1 TABLET BY MOUTH THREE TIMES DAILY 90 tablet 6   levothyroxine (SYNTHROID) 50 MCG tablet TAKE 1 TABLET(50 MCG) BY MOUTH DAILY BEFORE BREAKFAST 90 tablet 1   Multiple Vitamin (MULTIVITAMIN) capsule Take 1 capsule by mouth daily.     nystatin (MYCOSTATIN) 100000 UNIT/ML suspension Take 5 mLs (500,000 Units total) by mouth 4 (four) times daily. (Patient not taking: Reported on 03/09/2022) 60 mL 0   Omega-3 Fatty Acids (FISH OIL) 1000 MG CAPS Take 1,000 mg by mouth daily.     Pembrolizumab (KEYTRUDA IV) Inject 1 Dose into the vein every 21 ( twenty-one) days.     potassium chloride (KLOR-CON M) 10 MEQ tablet TAKE 1 TABLET(10 MEQ) BY MOUTH DAILY 90 tablet 0   PREVIDENT 5000 ENAMEL PROTECT 1.1-5 % GEL Place 1 application. onto teeth at bedtime.     Probiotic Product (UP4 PROBIOTICS ADULT PO) Take 1 capsule by mouth daily.     rosuvastatin (CRESTOR) 40 MG tablet Take 1 tablet (40 mg total) by mouth daily. 90 tablet 3   torsemide (DEMADEX) 20 MG tablet TAKE 1 TABLET BY MOUTH DAILY 30 tablet 6   No current facility-administered medications for this visit.    SURGICAL HISTORY:  Past Surgical History:  Procedure Laterality Date   BIV ICD INSERTION CRT-D  N/A 12/03/2021   Procedure: BIV ICD INSERTION CRT-D;  Surgeon: Lanier Prude, MD;  Location: [facility] INVASIVE CV LAB;  Service: Cardiovascular;  Laterality: N/A;   RIGHT/LEFT HEART CATH AND CORONARY ANGIOGRAPHY N/A 02/16/2021   Procedure: RIGHT/LEFT HEART CATH AND CORONARY ANGIOGRAPHY;  Surgeon: Dolores Patty, MD;  Location: [facility] INVASIVE CV LAB;  Service: Cardiovascular;  Laterality: N/A;   ROBOT ASSISTED LAPAROSCOPIC NEPHRECTOMY Right 06/23/2021   Procedure: XI ROBOTIC ASSISTED LAPAROSCOPIC NEPHRECTOMY;  Surgeon: Rene Paci, MD;  Location: [facility] ORS;  Service: Urology;  Laterality: Right;    REVIEW OF SYSTEMS:   Constitutional: negative Eyes: negative Ears, nose, mouth, throat, and face: positive for dry mouth and running nose Respiratory: negative Cardiovascular: negative Gastrointestinal: negative Genitourinary:negative Integument/breast: negative Hematologic/lymphatic: negative Musculoskeletal:negative Neurological: negative Behavioral/Psych: negative Endocrine: negative Allergic/Immunologic: negative   PHYSICAL EXAMINATION: General appearance: alert, cooperative, and no distress Head: Normocephalic, without obvious abnormality, atraumatic Neck: no adenopathy, no JVD, supple, symmetrical, trachea midline, and thyroid not enlarged, symmetric, no tenderness/mass/nodules Lymph nodes: Cervical, supraclavicular, and axillary nodes normal. Resp: clear to auscultation bilaterally Back: symmetric, no curvature. ROM normal. No CVA tenderness. Cardio: regular rate and rhythm, S1, S2 normal, no murmur, click, rub or gallop GI: soft, non-tender; bowel sounds normal; no masses,  no organomegaly Extremities: extremities normal, atraumatic, no cyanosis or edema Neurologic: Alert and oriented X 3, normal strength and tone. Normal symmetric reflexes. Normal coordination and gait  ECOG PERFORMANCE STATUS: 1 - Symptomatic but completely ambulatory  Blood pressure (!) 99/56, pulse 76, temperature 97.9 F (36.6 C), temperature source Oral, resp. rate 17, weight 235 lb 1.6 oz (106.6 kg), SpO2 97 %.  LABORATORY DATA: Lab Results  Component Value Date   WBC 5.3 04/21/2022   HGB 12.3 (L) 04/21/2022   HCT 33.8 (L) 04/21/2022   MCV 81.3 04/21/2022   PLT 143 (L) 04/21/2022      Chemistry      Component Value Date/Time   NA 135 04/21/2022 1352   NA 137 11/23/2021 1145   K 4.3 04/21/2022 1352   CL 103 04/21/2022 1352   CO2 25 04/21/2022 1352   BUN 73 (H) 04/21/2022 1352   BUN 48 (H) 11/23/2021 1145   CREATININE 3.19 (H) 04/21/2022 1352   GLU 98 07/10/2021 0000      Component Value Date/Time    CALCIUM 10.8 (H) 04/21/2022 1352   ALKPHOS 35 (L) 04/21/2022 1352   AST 34 04/21/2022 1352   ALT 17 04/21/2022 1352   BILITOT 0.6 04/21/2022 1352       RADIOGRAPHIC STUDIES: CT Chest Wo Contrast  Result Date: 04/30/2022 CLINICAL DATA:  Renal cell carcinoma.  * Tracking Code: BO * EXAM: CT CHEST, ABDOMEN AND PELVIS WITHOUT CONTRAST TECHNIQUE: Multidetector CT imaging of the chest, abdomen and pelvis was performed following the standard protocol without IV contrast. RADIATION DOSE REDUCTION: This exam was performed according to the departmental dose-optimization program which includes automated exposure control, adjustment of the mA and/or kV according to patient size and/or use of iterative reconstruction technique. COMPARISON:  CT abdomen pelvis 11/09/2021, MR abdomen 04/20/2021, CT abdomen pelvis 03/24/2021 and CT chest 03/20/2021. FINDINGS: CT CHEST FINDINGS Cardiovascular: Atherosclerotic calcification of the aorta and coronary arteries. Enlarged pulmonic trunk and heart. No pericardial effusion. Mediastinum/Nodes: No pathologically enlarged mediastinal or axillary lymph nodes. Hilar regions are difficult to definitively evaluate without IV contrast. Esophagus is grossly unremarkable. Lungs/Pleura: 3 mm left lower lobe nodule (507/89), as on 03/24/2021. Vague area of subpleural ground-glass in  the anterior right upper lobe (5 7/50), unchanged. Bibasilar volume loss and bronchiectasis adjacent to elevated hemidiaphragms. No pleural fluid. Airway is unremarkable. Musculoskeletal: Degenerative changes in the spine. No worrisome lytic or sclerotic lesions. CT ABDOMEN PELVIS FINDINGS Hepatobiliary: Liver and gallbladder are unremarkable. No biliary ductal dilatation. Pancreas: Negative. Spleen: Negative. Adrenals/Urinary Tract: Right adrenalectomy and right nephrectomy. 4 mm nodule in the surgical bed (504/78), unchanged. Left adrenal gland is unremarkable. Subcentimeter lesions off the left kidney, stable  but too small to characterize. Recommend continued attention on follow-up. Low-attenuation cysts in the left kidney measure up to 4.3 cm, as before. No specific follow-up necessary. Left ureter is decompressed. Bladder is low in volume. Stomach/Bowel: Stomach, small bowel, appendix and colon are unremarkable. Vascular/Lymphatic: Atherosclerotic calcification of the aorta. No pathologically enlarged lymph nodes. Retroaortic left renal vein. Reproductive: Enlarged prostate. Other: No free fluid. Small umbilical hernia contains fat. Peripherally calcified left paramidline mesenteric nodule, unchanged and benign. Mesenteries and peritoneum are otherwise unremarkable. Musculoskeletal: Degenerative changes in the spine. Degenerative changes in the spine. No worrisome lytic or sclerotic lesions. IMPRESSION: 1. No evidence of recurrent or metastatic disease. 2. Small vague area of subpleural ground-glass in the right upper lobe, unchanged and likely due to scarring. Recommend attention on follow-up. 3. Enlarged prostate. 4. Aortic atherosclerosis (ICD10-I70.0). Coronary artery calcification. 5. Enlarged pulmonic trunk, indicative of pulmonary arterial hypertension. Electronically Signed   By: Leanna Battles M.D.   On: 04/30/2022 08:29   CT Abdomen Pelvis Wo Contrast  Result Date: 04/30/2022 CLINICAL DATA:  Renal cell carcinoma.  * Tracking Code: BO * EXAM: CT CHEST, ABDOMEN AND PELVIS WITHOUT CONTRAST TECHNIQUE: Multidetector CT imaging of the chest, abdomen and pelvis was performed following the standard protocol without IV contrast. RADIATION DOSE REDUCTION: This exam was performed according to the departmental dose-optimization program which includes automated exposure control, adjustment of the mA and/or kV according to patient size and/or use of iterative reconstruction technique. COMPARISON:  CT abdomen pelvis 11/09/2021, MR abdomen 04/20/2021, CT abdomen pelvis 03/24/2021 and CT chest 03/20/2021. FINDINGS: CT  CHEST FINDINGS Cardiovascular: Atherosclerotic calcification of the aorta and coronary arteries. Enlarged pulmonic trunk and heart. No pericardial effusion. Mediastinum/Nodes: No pathologically enlarged mediastinal or axillary lymph nodes. Hilar regions are difficult to definitively evaluate without IV contrast. Esophagus is grossly unremarkable. Lungs/Pleura: 3 mm left lower lobe nodule (507/89), as on 03/24/2021. Vague area of subpleural ground-glass in the anterior right upper lobe (5 7/50), unchanged. Bibasilar volume loss and bronchiectasis adjacent to elevated hemidiaphragms. No pleural fluid. Airway is unremarkable. Musculoskeletal: Degenerative changes in the spine. No worrisome lytic or sclerotic lesions. CT ABDOMEN PELVIS FINDINGS Hepatobiliary: Liver and gallbladder are unremarkable. No biliary ductal dilatation. Pancreas: Negative. Spleen: Negative. Adrenals/Urinary Tract: Right adrenalectomy and right nephrectomy. 4 mm nodule in the surgical bed (504/78), unchanged. Left adrenal gland is unremarkable. Subcentimeter lesions off the left kidney, stable but too small to characterize. Recommend continued attention on follow-up. Low-attenuation cysts in the left kidney measure up to 4.3 cm, as before. No specific follow-up necessary. Left ureter is decompressed. Bladder is low in volume. Stomach/Bowel: Stomach, small bowel, appendix and colon are unremarkable. Vascular/Lymphatic: Atherosclerotic calcification of the aorta. No pathologically enlarged lymph nodes. Retroaortic left renal vein. Reproductive: Enlarged prostate. Other: No free fluid. Small umbilical hernia contains fat. Peripherally calcified left paramidline mesenteric nodule, unchanged and benign. Mesenteries and peritoneum are otherwise unremarkable. Musculoskeletal: Degenerative changes in the spine. Degenerative changes in the spine. No worrisome lytic or sclerotic lesions. IMPRESSION: 1.  No evidence of recurrent or metastatic disease. 2.  Small vague area of subpleural ground-glass in the right upper lobe, unchanged and likely due to scarring. Recommend attention on follow-up. 3. Enlarged prostate. 4. Aortic atherosclerosis (ICD10-I70.0). Coronary artery calcification. 5. Enlarged pulmonic trunk, indicative of pulmonary arterial hypertension. Electronically Signed   By: Leanna Battles M.D.   On: 04/30/2022 08:29   ECHOCARDIOGRAM COMPLETE  Result Date: 04/14/2022    ECHOCARDIOGRAM REPORT   Patient Name:   Bryce Cain  Date of Exam: 04/13/2022 Medical Rec #:  [id number]     Height:       70.0 in Accession #:    [id number]    Weight:       229.5 lb Date of Birth:  24-Jan-1950     BSA:          2.213 m Patient Age:    71 years      BP:           120/68 mmHg Patient Gender: M             HR:           89 bpm. Exam Location:  [address] Procedure: 2D Echo, Cardiac Doppler and Color Doppler Indications:    I50.22 CHF  History:        Patient has prior history of Echocardiogram examinations, most                 recent 05/29/2021. CHF, Defibrillator, Arrythmias:LBBB; Risk                 Factors:Hypertension, Dyslipidemia, Diabetes and Former Smoker.  Sonographer:    Samule Ohm RDCS Referring Phys: [id number] Lanier Prude  Sonographer Comments: Technically difficult study due to poor echo windows and patient is obese. Image acquisition challenging due to patient body habitus. IMPRESSIONS  1. Left ventricular ejection fraction, by estimation, is <20%. The left ventricle has severely decreased function. The left ventricle demonstrates global hypokinesis. The left ventricular internal cavity size was severely dilated. There is moderate concentric left ventricular hypertrophy. Left ventricular diastolic function could not be evaluated.  2. Right ventricular systolic function was not well visualized. The right ventricular size is not well visualized.  3. Left atrial size was mild to moderately dilated.  4. The mitral valve is normal in structure.  Trivial mitral valve regurgitation. No evidence of mitral stenosis.  5. The aortic valve was not well visualized. Aortic valve regurgitation is mild. Aortic valve sclerosis is present, with no evidence of aortic valve stenosis. Aortic regurgitation PHT measures 483 msec. Comparison(s): Prior images reviewed side by side. EF remains severely reduced, but LV diameter slightly improved, apical wall motion improved. FINDINGS  Left Ventricle: Left ventricular ejection fraction, by estimation, is <20%. The left ventricle has severely decreased function. The left ventricle demonstrates global hypokinesis. The left ventricular internal cavity size was severely dilated. There is moderate concentric left ventricular hypertrophy. Left ventricular diastolic function could not be evaluated due to paced rhythm. Left ventricular diastolic function could not be evaluated. Right Ventricle: The right ventricular size is not well visualized. Right vetricular wall thickness was not well visualized. Right ventricular systolic function was not well visualized. Left Atrium: Left atrial size was mild to moderately dilated. Right Atrium: Right atrial size was not well visualized. Pericardium: There is no evidence of pericardial effusion. Mitral Valve: The mitral valve is normal in structure. Trivial mitral valve regurgitation. No evidence of mitral valve stenosis. Tricuspid Valve: The  tricuspid valve is not well visualized. Tricuspid valve regurgitation is not demonstrated. Aortic Valve: The aortic valve was not well visualized. Aortic valve regurgitation is mild. Aortic regurgitation PHT measures 483 msec. Aortic valve sclerosis is present, with no evidence of aortic valve stenosis. Pulmonic Valve: The pulmonic valve was not well visualized. Pulmonic valve regurgitation is mild. No evidence of pulmonic stenosis. Aorta: The aortic root and ascending aorta are structurally normal, with no evidence of dilitation. Venous: The inferior vena  cava was not well visualized. IAS/Shunts: The interatrial septum was not well visualized. Additional Comments: A device lead is visualized.  LEFT VENTRICLE PLAX 2D LVIDd:         6.30 cm   Diastology LVIDs:         5.45 cm   LV e' medial:    3.81 cm/s LV PW:         1.30 cm   LV E/e' medial:  29.9 LV IVS:        1.35 cm   LV e' lateral:   8.92 cm/s LVOT diam:     2.10 cm   LV E/e' lateral: 12.8 LV SV:         34 LV SV Index:   15 LVOT Area:     3.46 cm  LEFT ATRIUM             Index        RIGHT ATRIUM LA diam:        3.90 cm 1.76 cm/m   RA Pressure: 3.00 mmHg LA Vol (A2C):   72.9 ml 32.94 ml/m LA Vol (A4C):   64.6 ml 29.19 ml/m LA Biplane Vol: 68.7 ml 31.04 ml/m  AORTIC VALVE LVOT Vmax:   64.70 cm/s LVOT Vmean:  44.400 cm/s LVOT VTI:    0.098 m AI PHT:      483 msec  AORTA Ao Asc diam: 3.60 cm MITRAL VALVE                TRICUSPID VALVE MV Area (PHT): 5.23 cm     Estimated RAP:  3.00 mmHg MV Decel Time: 145 msec MV E velocity: 114.00 cm/s  SHUNTS                             Systemic VTI:  0.10 m                             Systemic Diam: 2.10 cm Jodelle Red MD Electronically signed by Jodelle Red MD Signature Date/Time: 04/14/2022/8:03:46 PM    Final     ASSESSMENT AND PLAN: This is a very pleasant 72 years old African-American male with 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   He is a status post robotic assisted laparoscopic right radical nephrectomy on June 23, 2021.  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 The patient is currently undergoing adjuvant treatment with immunotherapy with Keytruda 200 Mg IV every 3 weeks status post 12 cycles.  He has been tolerating this treatment well except for the dry mouth, running nose and lack of taste. The patient had repeat CT scan of  the chest, abdomen and pelvis performed recently.  I personally and independently reviewed the scan  and discussed the result with the patient today. His scan showed no concerning findings for disease recurrence or metastasis. I recommended for the patient to continue his current treatment with Keytruda every 3 weeks and he will proceed with cycle #13 today. The patient was advised to call immediately if he has any other concerning symptoms in the interval. The patient voices understanding of current disease status and treatment options and is in agreement with the current care plan.  All questions were answered. The patient knows to call the clinic with any problems, questions or concerns. We can certainly see the patient much sooner if necessary.  The total time spent in the appointment was 30 minutes.  Disclaimer: This note was dictated with voice recognition software. Similar sounding words can inadvertently be transcribed and may not be corrected upon review.

## 2022-05-12 NOTE — Progress Notes (Signed)
Per Dr Shirline Frees, ok to tx with elevated Scr.

## 2022-05-13 ENCOUNTER — Ambulatory Visit (HOSPITAL_COMMUNITY)
Admission: RE | Admit: 2022-05-13 | Discharge: 2022-05-13 | Disposition: A | Discharge status: Home / Self Care | Payer: Medicare PPO | Source: Ambulatory Visit | Attending: Internal Medicine | Admitting: Internal Medicine

## 2022-05-13 ENCOUNTER — Encounter (HOSPITAL_COMMUNITY): Payer: Self-pay | Admitting: Internal Medicine

## 2022-05-13 VITALS — BP 100/60 | HR 81 | Wt 234.2 lb

## 2022-05-13 DIAGNOSIS — E1122 Type 2 diabetes mellitus with diabetic chronic kidney disease: Secondary | ICD-10-CM | POA: Insufficient documentation

## 2022-05-13 DIAGNOSIS — I428 Other cardiomyopathies: Secondary | ICD-10-CM | POA: Diagnosis not present

## 2022-05-13 DIAGNOSIS — N184 Chronic kidney disease, stage 4 (severe): Secondary | ICD-10-CM | POA: Insufficient documentation

## 2022-05-13 DIAGNOSIS — I5022 Chronic systolic (congestive) heart failure: Secondary | ICD-10-CM | POA: Diagnosis not present

## 2022-05-13 DIAGNOSIS — Z8249 Family history of ischemic heart disease and other diseases of the circulatory system: Secondary | ICD-10-CM | POA: Insufficient documentation

## 2022-05-13 DIAGNOSIS — I447 Left bundle-branch block, unspecified: Secondary | ICD-10-CM

## 2022-05-13 DIAGNOSIS — Z905 Acquired absence of kidney: Secondary | ICD-10-CM | POA: Insufficient documentation

## 2022-05-13 DIAGNOSIS — I130 Hypertensive heart and chronic kidney disease with heart failure and stage 1 through stage 4 chronic kidney disease, or unspecified chronic kidney disease: Secondary | ICD-10-CM | POA: Diagnosis not present

## 2022-05-13 DIAGNOSIS — I2510 Atherosclerotic heart disease of native coronary artery without angina pectoris: Secondary | ICD-10-CM | POA: Insufficient documentation

## 2022-05-13 DIAGNOSIS — Z87891 Personal history of nicotine dependence: Secondary | ICD-10-CM | POA: Insufficient documentation

## 2022-05-13 DIAGNOSIS — Z95810 Presence of automatic (implantable) cardiac defibrillator: Secondary | ICD-10-CM | POA: Diagnosis not present

## 2022-05-13 DIAGNOSIS — C641 Malignant neoplasm of right kidney, except renal pelvis: Secondary | ICD-10-CM | POA: Diagnosis not present

## 2022-05-13 DIAGNOSIS — Z7982 Long term (current) use of aspirin: Secondary | ICD-10-CM | POA: Insufficient documentation

## 2022-05-13 DIAGNOSIS — G4733 Obstructive sleep apnea (adult) (pediatric): Secondary | ICD-10-CM | POA: Insufficient documentation

## 2022-05-13 DIAGNOSIS — Z85528 Personal history of other malignant neoplasm of kidney: Secondary | ICD-10-CM | POA: Diagnosis not present

## 2022-05-13 DIAGNOSIS — Z79899 Other long term (current) drug therapy: Secondary | ICD-10-CM | POA: Insufficient documentation

## 2022-05-13 DIAGNOSIS — R682 Dry mouth, unspecified: Secondary | ICD-10-CM | POA: Insufficient documentation

## 2022-05-13 DIAGNOSIS — I472 Ventricular tachycardia, unspecified: Secondary | ICD-10-CM | POA: Diagnosis not present

## 2022-05-13 DIAGNOSIS — Z7984 Long term (current) use of oral hypoglycemic drugs: Secondary | ICD-10-CM | POA: Diagnosis not present

## 2022-05-13 DIAGNOSIS — I340 Nonrheumatic mitral (valve) insufficiency: Secondary | ICD-10-CM | POA: Insufficient documentation

## 2022-05-13 MED ORDER — EMPAGLIFLOZIN 10 MG PO TABS: 10.0000 mg | ORAL_TABLET | Freq: Every day | ORAL | 3 refills | Status: DC

## 2022-05-13 NOTE — Progress Notes (Signed)
ADVANCED HF CLINIC NOTE   Primary Care: Philip Aspen, [name], MD HF Cardiologist: Dr. Gala Romney  HPI: Dr. 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   Admitted 2/23 with new onset systolic heart failure. Echo EF 20-25% with anterior/apical WMA. Moderate MR. RV normal.Cath with minimal nonobstructive CAD.  cMRI LVEF 16%, RVEF 52%, LGE basal septal midwall (scar pattern seen with NICM). Cardiomyopathy felt to be likely due to LBBB +/- frequent PVCs . EP consulted -> need to wait 3 months for CRT. Discharged home with LifeVest, weight 219 lbs.  Readmitted in 03/20/21 with low output HF and transient shock. Treated with milrinone and improved. Repeat echo EF 20% . Milrinone weaned off. Seen by EP regarding CRT but recommended waiting 3 months for GDMT   Renal u/s found large right renal mass and CT confirmed RCC.  Seen in Urology Clinic and felt to need relatively urgent right nephrectomy. s/p R nephrectomy d/t RCC 06/2021. Post nephrectomy HF meds stopped due to hypotension and AKI. Entresto, spironolactone, and torsemide held at discharge. He had f/u in HF clinic and creatinine remained elevated so meds remained on hold.   Now on Keytruda has had 13/17 treatments ever 3 weeks   S/p Abbott CRT 10/27/21.  Today he returns for HF follow up. Says he has a very dry mouth due to [facility]. Taking torsemide 20. Not overly active denies CP or SOB. Edema under control. Most recent Scr 3.0  ICD interrogated No VT/AV 98% bivpacing fluid ok Personally reviewed  Echo 04/13/22 EF < 20% RV ok. Personally reviewed  Cardiac Studies: - Echo (2/23): EF 20-25% with anterior/apical WMA suggestive of previous anterior MI. Moderate MR. RV normal    - R/LHC (2/23):   Prox RCA lesion is 30% stenosed.   1st Diag lesion is 20/% stenosed.   Prox Cx to Mid Cx lesion is 20% stenosed.  Ao = 102/70 (85) LV = 104/24 RA =  10 RV =  47/17 PA = 52/22 (33) PCW = 25 Fick cardiac output/index = 5.5/2.5 PVR =1.5 WU Ao sat = 98% PA sat = 65%, 66%   Assessment: 1. 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 Elevated filling pressures with normal cardiac output   - cMRI (2/23): LVEF 16%, RVEF 52%, LGE basal septal midwall (scar pattern seen with NICM).     Past Medical History:  Diagnosis Date   CHF (congestive heart failure) (HCC)    DM (diabetes mellitus), type 2 (HCC)    OSA on CPAP    Current Outpatient Medications  Medication Sig Dispense Refill   acetaminophen (TYLENOL) 325 MG tablet Take 1-2 tablets (325-650 mg total) by mouth every 4 (four) hours as needed for mild pain.     aspirin EC 81 MG tablet Take 1 tablet (81 mg total) by mouth daily. Swallow whole. 90 tablet 3   empagliflozin (JARDIANCE) 10 MG TABS tablet Take 1 tablet (10 mg total) by mouth daily. 90 tablet 3   isosorbide-hydrALAZINE (BIDIL) 20-37.5 MG tablet TAKE 1 TABLET BY MOUTH THREE TIMES DAILY 90 tablet 6   levothyroxine (SYNTHROID) 50 MCG tablet TAKE 1 TABLET(50 MCG) BY MOUTH DAILY BEFORE BREAKFAST 90 tablet 1   Omega-3 Fatty Acids (FISH OIL) 1000 MG CAPS Take 1,000 mg by mouth daily.     Pembrolizumab (KEYTRUDA IV) Inject 1 Dose into the vein every  21 ( twenty-one) days.     potassium chloride (KLOR-CON M) 10 MEQ tablet TAKE 1 TABLET(10 MEQ) BY MOUTH DAILY 90 tablet 0   PREVIDENT 5000 ENAMEL PROTECT 1.1-5 % GEL Place 1 application. onto teeth at bedtime.     Probiotic Product (UP4 PROBIOTICS ADULT PO) Take 1 capsule by mouth daily.     rosuvastatin (CRESTOR) 40 MG tablet Take 1 tablet (40 mg total) by mouth daily. 90 tablet 3   torsemide (DEMADEX) 20 MG tablet TAKE 1 TABLET BY MOUTH DAILY 30 tablet 6   nystatin (MYCOSTATIN) 100000 UNIT/ML suspension Take 5 mLs (500,000 Units total) by mouth 4 (four) times daily. (Patient not taking: Reported on 03/09/2022) 60 mL 0   sodium bicarbonate  650 MG tablet Take 650 mg by mouth 3 (three) times daily. (Patient not taking: Reported on 05/13/2022)     No current facility-administered medications for this encounter.   No Known Allergies  Social History   Socioeconomic History   Marital status: Married    Spouse name: Not on file   Number of children: Not on file   Years of education: Not on file   Highest education level: Not on file  Occupational History   Not on file  Tobacco Use   Smoking status: Former    Types: Pipe    Quit date: 02/05/1991    Years since quitting: 31.2   Smokeless tobacco: Not on file  Substance and Sexual Activity   Alcohol use: Not on file   Drug use: Not on file   Sexual activity: Not on file  Other Topics Concern   Not on file  Social History Narrative   Not on file   Social Determinants of Health   Financial Resource Strain: Low Risk  (04/02/2021)   Overall Financial Resource Strain (CARDIA)    Difficulty of Paying Living Expenses: Not hard at all  Food Insecurity: No Food Insecurity (04/02/2021)   Hunger Vital Sign    Worried About Running Out of Food in the Last Year: Never true    Ran Out of Food in the Last Year: Never true  Transportation Needs: No Transportation Needs (04/02/2021)   PRAPARE - Administrator, Civil Service (Medical): No    Lack of Transportation (Non-Medical): No  Physical Activity: Not on file  Stress: Not on file  Social Connections: Not on file  Intimate Partner Violence: Not on file   Family History  Problem Relation Age of Onset   Heart attack Father    Wt Readings from Last 3 Encounters:  05/13/22 106.2 kg (234 lb 3.2 oz)  05/12/22 106.6 kg (235 lb 1.6 oz)  04/21/22 104.8 kg (231 lb)   BP 100/60   Pulse 81   Wt 106.2 kg (234 lb 3.2 oz)   SpO2 95%   BMI 33.60 kg/m   PHYSICAL EXAM: General:  Well appearing. No resp difficulty HEENT: normal Neck: supple. no JVD. Carotids 2+ bilat; no bruits. No lymphadenopathy or thryomegaly  appreciated. Cor: PMI nondisplaced. Regular rate & rhythm. No rubs, gallops or murmurs. Lungs: clear Abdomen: obese soft, nontender, nondistended. No hepatosplenomegaly. No bruits or masses. Good bowel sounds. Extremities: no cyanosis, clubbing, rash, edema Neuro: alert & orientedx3, cranial nerves grossly intact. moves all 4 extremities w/o difficulty. Affect pleasant   ASSESSMENT & PLAN:  1. Chronic systolic HF - Echo 2/23 EF 20-25% with anterior/apical WMA - R/LHC: Minimal non-obstructive CAD with separate left coronary ostia, severe NICM suspect due to LBBB,  and elevated filling pressures with normal cardiac output.  - cMRI: LVEF 16%, RV systolic function  52%, basal septal midwall late gadolinium enhancement (scar pattern seen in NICM and associated with worse prognosis), RV insertion LGE  - Readmit ADHF/shock 3/23. Echo EF 20% Briefly required milrinone.  - Echo 05/29/21 EF <20% LV markedly dilated. RV normal. Mild MR.  - s/p Abbott CRT-D 10/23 - Echo 1/24 EF < 20% - Stable/improved NYHA II-III. Volume status ok - Continue torsemide 20mg  daily  - Continue Jardiance 10 mg daily.  Cleda Daub and entresto on hold due to elevated creatinine  - Continue BIDI 1 tab tid. Avoid hypotension with CKD IV  - ICD interrogated No VT/AV 98% bivpacing fluid ok Personally reviewed - Symptomatically stable despite no improvement in severely depressed EF with CRT. Will continue to follow closely. Given age and CKD IV options are very limited as not candidate for advanced therapies  2. PVCs/NSVT - ICD in place - interrogation as above  3. DM2 - A1c 5.4% - On SGLT2i. - Per PCP   4. CKD IV - Continue Jardiance - Scr now ~3.0 after recent nephrectomy - Follows with Renal   5. OSA - Continue CPAP. - [facility]   6. Minimal non-obstructive CAD - On ASA/statin. - No s/s angina - LDL 74.   7. LBBB  - EF remains depressed despite maximal GDMT - suspect  LBBB-CM - s/p CRT-D 10/23. Post ECG QRS 174 -> 164  8. Large right renal cell CA - 06/2021 S/P R Nephrectomy - Now on Keytruda for 17 doses    Arvilla Meres, MD  4:11 PM

## 2022-05-13 NOTE — Patient Instructions (Signed)
Good to see you today!   No medication changes   Your physician recommends that you schedule a follow-up appointment in: 6 months(November) Call office in September to schedule an appointment  If you have any questions or concerns before your next appointment please send us a message through mychart or call our office at [phone].    TO LEAVE A MESSAGE FOR THE NURSE SELECT OPTION 2, PLEASE LEAVE A MESSAGE INCLUDING: YOUR NAME DATE OF BIRTH CALL BACK NUMBER REASON FOR CALL**this is important as we prioritize the call backs  YOU WILL RECEIVE A CALL BACK THE SAME DAY AS LONG AS YOU CALL BEFORE 4:00 PM  At the [facility], you and your health needs are our priority. As part of our continuing mission to provide you with exceptional heart care, we have created designated Provider Care Teams. These Care Teams include your primary Cardiologist (physician) and Advanced Practice Providers (APPs- Physician Assistants and Nurse Practitioners) who all work together to provide you with the care you need, when you need it.   You may see any of the following providers on your designated Care Team at your next follow up: Dr Daniel Bensimhon Dr Dalton McLean Dr. Aditya Sabharwal Amy Clegg, NP Brittainy Simmons, PA Jessica Milford,NP Lindsay Finch, PA Alma Diaz, NP Lauren Kemp, PharmD   Please be sure to bring in all your medications bottles to every appointment.    Thank you for choosing [facility]    

## 2022-05-20 ENCOUNTER — Encounter: Payer: Self-pay | Admitting: Internal Medicine

## 2022-06-01 ENCOUNTER — Ambulatory Visit (INDEPENDENT_AMBULATORY_CARE_PROVIDER_SITE_OTHER): Payer: Medicare PPO | Admitting: Internal Medicine

## 2022-06-01 ENCOUNTER — Encounter: Payer: Self-pay | Admitting: Internal Medicine

## 2022-06-01 VITALS — BP 110/70 | HR 85 | Temp 97.3°F | Ht 67.5 in | Wt 236.4 lb

## 2022-06-01 DIAGNOSIS — I152 Hypertension secondary to endocrine disorders: Secondary | ICD-10-CM

## 2022-06-01 DIAGNOSIS — E785 Hyperlipidemia, unspecified: Secondary | ICD-10-CM | POA: Diagnosis not present

## 2022-06-01 DIAGNOSIS — Z0000 Encounter for general adult medical examination without abnormal findings: Secondary | ICD-10-CM

## 2022-06-01 DIAGNOSIS — Z95810 Presence of automatic (implantable) cardiac defibrillator: Secondary | ICD-10-CM

## 2022-06-01 DIAGNOSIS — E1159 Type 2 diabetes mellitus with other circulatory complications: Secondary | ICD-10-CM | POA: Diagnosis not present

## 2022-06-01 DIAGNOSIS — Z1159 Encounter for screening for other viral diseases: Secondary | ICD-10-CM

## 2022-06-01 DIAGNOSIS — Z1211 Encounter for screening for malignant neoplasm of colon: Secondary | ICD-10-CM

## 2022-06-01 DIAGNOSIS — Z7984 Long term (current) use of oral hypoglycemic drugs: Secondary | ICD-10-CM | POA: Diagnosis not present

## 2022-06-01 DIAGNOSIS — E1169 Type 2 diabetes mellitus with other specified complication: Secondary | ICD-10-CM | POA: Diagnosis not present

## 2022-06-01 NOTE — Progress Notes (Signed)
Established Patient Office Visit     CC/Reason for Visit: Annual preventive exam and subsequent Medicare wellness visit  HPI: Bryce Cain is a 72 y.o. male who is coming in today for the above mentioned reasons. 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  His only complaint today is an excessively dry mouth that he attributes to the [facility].  He states he had a colonoscopy remotely and was told he had benign polyps, does not remember his follow-up schedule.  He is overdue for an eye exam, has dental care.  Immunizations are up-to-date.   Past Medical/Surgical History: Past Medical History:  Diagnosis Date   CHF (congestive heart failure) (HCC)    DM (diabetes mellitus), type 2 (HCC)    OSA on CPAP     Past Surgical History:  Procedure Laterality Date   BIV ICD INSERTION CRT-D N/A 12/03/2021   Procedure: BIV ICD INSERTION CRT-D;  Surgeon: Lanier Prude, MD;  [facility] CV LAB;  Service: Cardiovascular;  Laterality: N/A;   RIGHT/LEFT HEART CATH AND CORONARY ANGIOGRAPHY N/A 02/16/2021   Procedure: RIGHT/LEFT HEART CATH AND CORONARY ANGIOGRAPHY;  Surgeon: Dolores Patty, MD;  Location: [facility] INVASIVE CV LAB;  Service: Cardiovascular;  Laterality: N/A;   ROBOT ASSISTED LAPAROSCOPIC NEPHRECTOMY Right 06/23/2021   Procedure: XI ROBOTIC ASSISTED LAPAROSCOPIC NEPHRECTOMY;  Surgeon: Rene Paci, MD;  Location: [facility] ORS;  Service: Urology;  Laterality: Right;    Social History:  reports that he quit smoking about 31 years ago. His smoking use included pipe. He does not have any smokeless tobacco history on file. No history on file for alcohol use and drug use.  Allergies: No Known Allergies  Family History:  Family History  Problem Relation Age of Onset   Heart  attack Father      Current Outpatient Medications:    acetaminophen (TYLENOL) 325 MG tablet, Take 1-2 tablets (325-650 mg total) by mouth every 4 (four) hours as needed for mild pain., Disp: , Rfl:    aspirin EC 81 MG tablet, Take 1 tablet (81 mg total) by mouth daily. Swallow whole., Disp: 90 tablet, Rfl: 3   empagliflozin (JARDIANCE) 10 MG TABS tablet, Take 1 tablet (10 mg total) by mouth daily., Disp: 90 tablet, Rfl: 3   isosorbide-hydrALAZINE (BIDIL) 20-37.5 MG tablet, TAKE 1 TABLET BY MOUTH THREE TIMES DAILY, Disp: 90 tablet, Rfl: 6   levothyroxine (SYNTHROID) 50 MCG tablet, TAKE 1 TABLET(50 MCG) BY MOUTH DAILY BEFORE BREAKFAST, Disp: 90 tablet, Rfl: 1   Omega-3 Fatty Acids (FISH OIL) 1000 MG CAPS, Take 1,000 mg by mouth daily., Disp: , Rfl:    Pembrolizumab (KEYTRUDA IV), Inject 1 Dose into the vein every 21 ( twenty-one) days., Disp: , Rfl:    potassium chloride (KLOR-CON M) 10 MEQ tablet, TAKE 1 TABLET(10 MEQ) BY MOUTH DAILY, Disp: 90 tablet, Rfl: 0   PREVIDENT 5000 ENAMEL PROTECT 1.1-5 % GEL, Place 1 application. onto teeth at bedtime., Disp: , Rfl:    Probiotic Product (UP4 PROBIOTICS ADULT PO), Take 1 capsule by mouth daily., Disp: , Rfl:    rosuvastatin (CRESTOR) 40 MG tablet, Take 1 tablet (40 mg total) by mouth daily., Disp: 90 tablet, Rfl: 3   sodium bicarbonate 650 MG tablet, Take 650 mg by mouth 3 (three) times daily., Disp: , Rfl:    torsemide (  DEMADEX) 20 MG tablet, TAKE 1 TABLET BY MOUTH DAILY, Disp: 30 tablet, Rfl: 6   nystatin (MYCOSTATIN) 100000 UNIT/ML suspension, Take 5 mLs (500,000 Units total) by mouth 4 (four) times daily. (Patient not taking: Reported on 06/01/2022), Disp: 60 mL, Rfl: 0  Review of Systems:  Negative unless indicated in HPI.   Physical Exam: Vitals:   06/01/22 1336  BP: 110/70  Pulse: 85  Temp: (!) 97.3 F (36.3 C)  TempSrc: Oral  SpO2: 98%  Weight: 236 lb 6.4 oz (107.2 kg)  Height: 5' 7.5" (1.715 m)    Body mass index is 36.48  kg/m.   Physical Exam Vitals reviewed.  Constitutional:      General: He is not in acute distress.    Appearance: Normal appearance. He is not ill-appearing, toxic-appearing or diaphoretic.  HENT:     Head: Normocephalic.     Right Ear: Tympanic membrane, ear canal and external ear normal. There is no impacted cerumen.     Left Ear: Tympanic membrane, ear canal and external ear normal. There is no impacted cerumen.     Nose: Nose normal.     Mouth/Throat:     Mouth: Mucous membranes are moist.     Pharynx: Oropharynx is clear. No oropharyngeal exudate or posterior oropharyngeal erythema.  Eyes:     General: No scleral icterus.       Right eye: No discharge.        Left eye: No discharge.     Conjunctiva/sclera: Conjunctivae normal.     Pupils: Pupils are equal, round, and reactive to light.  Neck:     Vascular: No carotid bruit.  Cardiovascular:     Rate and Rhythm: Normal rate and regular rhythm.     Pulses: Normal pulses.     Heart sounds: Normal heart sounds.  Pulmonary:     Effort: Pulmonary effort is normal. No respiratory distress.     Breath sounds: Normal breath sounds.  Abdominal:     General: Abdomen is flat. Bowel sounds are normal.     Palpations: Abdomen is soft.  Musculoskeletal:        General: Normal range of motion.     Cervical back: Normal range of motion.  Skin:    General: Skin is warm and dry.  Neurological:     General: No focal deficit present.     Mental Status: He is alert and oriented to person, place, and time. Mental status is at baseline.  Psychiatric:        Mood and Affect: Mood normal.        Behavior: Behavior normal.        Thought Content: Thought content normal.        Judgment: Judgment normal.      Subsequent Medicare wellness visit   1. Risk factors, based on past  M,S,F - Cardiac Risk Factors include: advanced age (>41men, >50 women);diabetes mellitus;dyslipidemia;hypertension   2.  Physical activities: Dietary issues  and exercise activities discussed:  Current Exercise Habits: Home exercise routine, Type of exercise: walking, Time (Minutes): 60, Frequency (Times/Week): 7, Weekly Exercise (Minutes/Week): 420, Intensity: Mild   3.  Depression/mood:  Flowsheet Row Office Visit from [facility]/01/2022 in [facility] Total Score 0        4.  ADL's:    06/01/2022    1:26 PM 06/22/2021    8:00 PM  In your present state of health, do you have any difficulty performing the  following activities:  Hearing? 0 0  Vision? 1 0  Difficulty concentrating or making decisions? 0 0  Walking or climbing stairs? 0 0  Dressing or bathing? 0 0  Doing errands, shopping? 0 0  Preparing Food and eating ? N   Using the Toilet? N   In the past six months, have you accidently leaked urine? N   Do you have problems with loss of bowel control? N   Managing your Medications? N   Managing your Finances? N   Housekeeping or managing your Housekeeping? N      5.  Fall risk:     12/03/2021    7:05 PM 12/04/2021    8:00 AM 12/09/2021    2:50 PM 01/06/2022    2:12 PM 06/01/2022    1:29 PM  Fall Risk  Falls in the past year?     0  Was there an injury with Fall?     0  Fall Risk Category Calculator     0  (RETIRED) Patient Fall Risk Level Moderate fall risk Moderate fall risk Low fall risk Low fall risk   Fall risk Follow up     Falls evaluation completed     6.  Home safety: No problems identified   7.  Height weight, and visual acuity: height and weight as above, vision/hearing: Vision Screening   Right eye Left eye Both eyes  Without correction     With correction 20/25 20/25 20/25      8.  Counseling: Counseling given: Not Answered    9. Lab orders based on risk factors: Laboratory update will be reviewed   10. Cognitive assessment:        06/01/2022    1:30 PM  6CIT Screen  What Year? 0 points  What month? 0 points  What time? 0 points  Count back from 20 0 points   Months in reverse 0 points  Repeat phrase 0 points  Total Score 0 points     11. Screening: Patient provided with a written and personalized 5-10 year screening schedule in the AVS. Health Maintenance  Topic Date Due   Yearly kidney health urinalysis for diabetes  Never done   Hepatitis C Screening  Never done   DTaP/Tdap/Td vaccine (1 - Tdap) Never done   Zoster (Shingles) Vaccine (1 of 2) Never done   Colon Cancer Screening  Never done   Pneumonia Vaccine (1 of 1 - PCV) Never done   COVID-19 Vaccine (3 - Pfizer risk series) 03/28/2019   Flu Shot  08/05/2022   Yearly kidney function blood test for diabetes  05/12/2023   Medicare Annual Wellness Visit  06/01/2023   HPV Vaccine  Aged Out    12. Provider List Update: Patient Care Team    Relationship Specialty Notifications Start End  Philip Aspen, [name], MD PCP - General Internal Medicine  05/25/21   Lanier Prude, MD PCP - Electrophysiology Cardiology  10/20/21      13. Advance Directives: Does Patient Have a Medical Advance Directive?: No Would patient like information on creating a medical advance directive?: No - Patient declined  14. Opioids: Patient is not on any opioid prescriptions and has no risk factors for a substance use disorder.   15.   Goals      other     Publication.          I have personally reviewed and noted the following in the patient's chart:   Medical and social  history Use of alcohol, tobacco or illicit drugs  Current medications and supplements Functional ability and status Nutritional status Physical activity Advanced directives List of other physicians Hospitalizations, surgeries, and ER visits in previous 12 months Vitals Screenings to include cognitive, depression, and falls Referrals and appointments  In addition, I have reviewed and discussed with patient certain preventive protocols, quality metrics, and best practice recommendations. A written personalized care  plan for preventive services as well as general preventive health recommendations were provided to patient.  Impression and Plan:  Medicare annual wellness visit, subsequent -     PSA; Future  Encounter for hepatitis C screening test for low risk patient -     Hepatitis C antibody; Future  Cardiac resynchronization therapy defibrillator (CRT-D) in place  Type 2 diabetes mellitus with other specified complication, without long-term current use of insulin (HCC) -     CBC with Differential/Platelet; Future -     Lipid panel; Future -     Microalbumin / creatinine urine ratio; Future -     Hemoglobin A1c; Future  Hypertension associated with diabetes (HCC)  Hyperlipidemia associated with type 2 diabetes mellitus (HCC)  Colon cancer screening -     Ambulatory referral to Gastroenterology   -Recommend routine eye and dental care. -Healthy lifestyle discussed in detail. -Labs to be updated today. -Prostate cancer screening: PSA today Health Maintenance  Topic Date Due   Yearly kidney health urinalysis for diabetes  Never done   Hepatitis C Screening  Never done   DTaP/Tdap/Td vaccine (1 - Tdap) Never done   Zoster (Shingles) Vaccine (1 of 2) Never done   Colon Cancer Screening  Never done   Pneumonia Vaccine (1 of 1 - PCV) Never done   COVID-19 Vaccine (3 - Pfizer risk series) 03/28/2019   Flu Shot  08/05/2022   Yearly kidney function blood test for diabetes  05/12/2023   Medicare Annual Wellness Visit  06/01/2023   HPV Vaccine  Aged Out   -All immunizations are up to date.     Chaya Jan, MD [facility]

## 2022-06-02 ENCOUNTER — Inpatient Hospital Stay (HOSPITAL_BASED_OUTPATIENT_CLINIC_OR_DEPARTMENT_OTHER): Payer: Medicare PPO | Admitting: Internal Medicine

## 2022-06-02 ENCOUNTER — Inpatient Hospital Stay: Payer: Medicare PPO

## 2022-06-02 ENCOUNTER — Other Ambulatory Visit (HOSPITAL_COMMUNITY): Payer: Self-pay | Admitting: Family Medicine

## 2022-06-02 DIAGNOSIS — Z5112 Encounter for antineoplastic immunotherapy: Secondary | ICD-10-CM | POA: Diagnosis not present

## 2022-06-02 DIAGNOSIS — C641 Malignant neoplasm of right kidney, except renal pelvis: Secondary | ICD-10-CM

## 2022-06-02 DIAGNOSIS — Z905 Acquired absence of kidney: Secondary | ICD-10-CM | POA: Diagnosis not present

## 2022-06-02 LAB — CBC WITH DIFFERENTIAL (CANCER CENTER ONLY)
Abs Immature Granulocytes: 0.02 10*3/uL (ref 0.00–0.07)
Basophils Absolute: 0.1 10*3/uL (ref 0.0–0.1)
Basophils Relative: 1 %
Eosinophils Absolute: 0.3 10*3/uL (ref 0.0–0.5)
Eosinophils Relative: 5 %
HCT: 33.6 % — ABNORMAL LOW (ref 39.0–52.0)
Hemoglobin: 11.9 g/dL — ABNORMAL LOW (ref 13.0–17.0)
Immature Granulocytes: 0 %
Lymphocytes Relative: 17 %
Lymphs Abs: 0.9 10*3/uL (ref 0.7–4.0)
MCH: 29 pg (ref 26.0–34.0)
MCHC: 35.4 g/dL (ref 30.0–36.0)
MCV: 81.8 fL (ref 80.0–100.0)
Monocytes Absolute: 0.7 10*3/uL (ref 0.1–1.0)
Monocytes Relative: 14 %
Neutro Abs: 3.2 10*3/uL (ref 1.7–7.7)
Neutrophils Relative %: 63 %
Platelet Count: 138 10*3/uL — ABNORMAL LOW (ref 150–400)
RBC: 4.11 MIL/uL — ABNORMAL LOW (ref 4.22–5.81)
RDW: 14.1 % (ref 11.5–15.5)
WBC Count: 5 10*3/uL (ref 4.0–10.5)
nRBC: 0 % (ref 0.0–0.2)

## 2022-06-02 LAB — CMP (CANCER CENTER ONLY)
ALT: 16 U/L (ref 0–44)
AST: 37 U/L (ref 15–41)
Albumin: 4.5 g/dL (ref 3.5–5.0)
Alkaline Phosphatase: 33 U/L — ABNORMAL LOW (ref 38–126)
Anion gap: 7 (ref 5–15)
BUN: 60 mg/dL — ABNORMAL HIGH (ref 8–23)
CO2: 25 mmol/L (ref 22–32)
Calcium: 10.1 mg/dL (ref 8.9–10.3)
Chloride: 104 mmol/L (ref 98–111)
Creatinine: 3.33 mg/dL — ABNORMAL HIGH (ref 0.61–1.24)
GFR, Estimated: 19 mL/min — ABNORMAL LOW (ref >60)
Glucose, Bld: 93 mg/dL (ref 70–99)
Potassium: 4.6 mmol/L (ref 3.5–5.1)
Sodium: 136 mmol/L (ref 135–145)
Total Bilirubin: 0.6 mg/dL (ref 0.3–1.2)
Total Protein: 7.9 g/dL (ref 6.5–8.1)

## 2022-06-02 MED ORDER — SODIUM CHLORIDE 0.9 % IV SOLN: Freq: Once | INTRAVENOUS | Status: AC

## 2022-06-02 MED ORDER — SODIUM CHLORIDE 0.9 % IV SOLN
200.0000 mg | Freq: Once | INTRAVENOUS | Status: AC
  Administered 2022-06-02: 200 mg via INTRAVENOUS
  Filled 2022-06-02: qty 200

## 2022-06-02 NOTE — Progress Notes (Signed)
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      PRIOR THERAPY: Robotic assisted laparoscopic right radical nephrectomy on June 23, 2021.  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      CURRENT THERAPY: Pembrolizumab 200 mg every 3 weeks started on August 19, 2021.  Status post 13 cycles.  INTERVAL HISTORY: Bryce Cain 72 y.o. male turns to the clinic today for follow-up visit.  The patient is feeling fine today with no concerning complaints.  He denied having any current chest pain, shortness of breath, cough or hemoptysis.  He has no nausea, vomiting, diarrhea or constipation.  He has no headache or visual changes.  He denied having any fever or chills.  He is here today for evaluation before starting cycle #14 of his treatment.   MEDICAL HISTORY: Past Medical History:  Diagnosis Date   CHF (congestive heart failure) (HCC)    DM (diabetes mellitus), type 2 (HCC)    OSA on CPAP     ALLERGIES:  has No Known Allergies.  MEDICATIONS:  Current Outpatient Medications  Medication Sig Dispense Refill   acetaminophen (TYLENOL) 325 MG tablet Take 1-2 tablets (325-650 mg total) by mouth every 4 (four) hours as needed for mild pain.     aspirin EC 81 MG tablet Take 1 tablet (81 mg total) by mouth daily. Swallow whole. 90 tablet 3   empagliflozin (JARDIANCE) 10 MG TABS tablet Take 1 tablet (10 mg total) by mouth daily. 90 tablet 3   isosorbide-hydrALAZINE (BIDIL) 20-37.5 MG tablet TAKE 1 TABLET BY MOUTH THREE TIMES DAILY 90 tablet 6   levothyroxine (SYNTHROID) 50 MCG tablet TAKE 1 TABLET(50 MCG) BY MOUTH  DAILY BEFORE BREAKFAST 90 tablet 1   nystatin (MYCOSTATIN) 100000 UNIT/ML suspension Take 5 mLs (500,000 Units total) by mouth 4 (four) times daily. (Patient not taking: Reported on 06/01/2022) 60 mL 0   Omega-3 Fatty Acids (FISH OIL) 1000 MG CAPS Take 1,000 mg by mouth daily.     Pembrolizumab (KEYTRUDA IV) Inject 1 Dose into the vein every 21 ( twenty-one) days.     potassium chloride (KLOR-CON M) 10 MEQ tablet TAKE 1 TABLET(10 MEQ) BY MOUTH DAILY 90 tablet 0   PREVIDENT 5000 ENAMEL PROTECT 1.1-5 % GEL Place 1 application. onto teeth at bedtime.     Probiotic Product (UP4 PROBIOTICS ADULT PO) Take 1 capsule by mouth daily.     rosuvastatin (CRESTOR) 40 MG tablet Take 1 tablet (40 mg total) by mouth daily. 90 tablet 3   sodium bicarbonate 650 MG tablet Take 650 mg by mouth 3 (three) times daily.     torsemide (DEMADEX) 20 MG tablet TAKE 1 TABLET BY MOUTH DAILY 30 tablet 6   No current facility-administered medications for this visit.    SURGICAL HISTORY:  Past Surgical History:  Procedure Laterality Date   BIV ICD INSERTION CRT-D N/A 12/03/2021   Procedure: BIV ICD INSERTION CRT-D;  Surgeon: Lanier Prude, MD;  Location: [facility] INVASIVE CV LAB;  Service: Cardiovascular;  Laterality: N/A;   RIGHT/LEFT HEART CATH AND CORONARY ANGIOGRAPHY N/A 02/16/2021   Procedure: RIGHT/LEFT HEART  CATH AND CORONARY ANGIOGRAPHY;  Surgeon: Dolores Patty, MD;  Location: [facility] INVASIVE CV LAB;  Service: Cardiovascular;  Laterality: N/A;   ROBOT ASSISTED LAPAROSCOPIC NEPHRECTOMY Right 06/23/2021   Procedure: XI ROBOTIC ASSISTED LAPAROSCOPIC NEPHRECTOMY;  Surgeon: Rene Paci, MD;  Location: [facility];  Service: Urology;  Laterality: Right;    REVIEW OF SYSTEMS:  A comprehensive review of systems was negative.   PHYSICAL EXAMINATION: General appearance: alert, cooperative, and no distress Head: Normocephalic, without obvious abnormality, atraumatic Neck: no adenopathy, no JVD, supple, symmetrical,  trachea midline, and thyroid not enlarged, symmetric, no tenderness/mass/nodules Lymph nodes: Cervical, supraclavicular, and axillary nodes normal. Resp: clear to auscultation bilaterally Back: symmetric, no curvature. ROM normal. No CVA tenderness. Cardio: regular rate and rhythm, S1, S2 normal, no murmur, click, rub or gallop GI: soft, non-tender; bowel sounds normal; no masses,  no organomegaly Extremities: extremities normal, atraumatic, no cyanosis or edema  ECOG PERFORMANCE STATUS: 1 - Symptomatic but completely ambulatory  Blood pressure 117/69, pulse 71, temperature 98 F (36.7 C), temperature source Oral, resp. rate 16, weight 235 lb 1.6 oz (106.6 kg), SpO2 95 %.  LABORATORY DATA: Lab Results  Component Value Date   WBC 5.0 06/02/2022   HGB 11.9 (L) 06/02/2022   HCT 33.6 (L) 06/02/2022   MCV 81.8 06/02/2022   PLT 138 (L) 06/02/2022      Chemistry      Component Value Date/Time   NA 135 05/12/2022 0955   NA 137 05/10/2022 0000   K 4.2 05/12/2022 0955   CL 106 05/12/2022 0955   CO2 23 05/12/2022 0955   BUN 62 (H) 05/12/2022 0955   BUN 66 (A) 05/10/2022 0000   CREATININE 3.02 (H) 05/12/2022 0955   GLU 94 05/10/2022 0000      Component Value Date/Time   CALCIUM 9.8 05/12/2022 0955   ALKPHOS 34 (L) 05/12/2022 0955   AST 32 05/12/2022 0955   ALT 15 05/12/2022 0955   BILITOT 0.6 05/12/2022 0955       RADIOGRAPHIC STUDIES: No results found.  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   He is a status post robotic assisted laparoscopic right radical nephrectomy on June 23, 2021.  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 The patient is currently undergoing adjuvant treatment with  immunotherapy with Keytruda 200 Mg IV every 3 weeks status post 13 cycles.   The patient has been tolerating his treatment fairly well with no concerning adverse effects. I recommended for him to proceed with cycle #14 today as planned. I will see him back for follow-up visit in 3 weeks for evaluation before the next cycle of his treatment. He was advised to call immediately if he has any other concerning symptoms in the interval. The patient voices understanding of current disease status and treatment options and is in agreement with the current care plan.  All questions were answered. The patient knows to call the clinic with any problems, questions or concerns. We can certainly see the patient much sooner if necessary.  The total time spent in the appointment was 20 minutes.  Disclaimer: This note was dictated with voice recognition software. Similar sounding words can inadvertently be transcribed and may not be corrected upon  review.

## 2022-06-02 NOTE — Patient Instructions (Signed)
[facility]  Discharge Instructions: Thank you for choosing [facility] to provide your oncology and hematology care.   If you have a lab appointment with the Cancer Center, please go directly to the Cancer Center and check in at the registration area.   Wear comfortable clothing and clothing appropriate for easy access to any Portacath or PICC line.   We strive to give you quality time with your provider. You may need to reschedule your appointment if you arrive late (15 or more minutes).  Arriving late affects you and other patients whose appointments are after yours.  Also, if you miss three or more appointments without notifying the office, you may be dismissed from the clinic at the provider's discretion.      For prescription refill requests, have your pharmacy contact our office and allow 72 hours for refills to be completed.    Today you received the following chemotherapy and/or immunotherapy agents keytruda      To help prevent nausea and vomiting after your treatment, we encourage you to take your nausea medication as directed.  BELOW ARE SYMPTOMS THAT SHOULD BE REPORTED IMMEDIATELY: *FEVER GREATER THAN 100.4 F (38 C) OR HIGHER *CHILLS OR SWEATING *NAUSEA AND VOMITING THAT IS NOT CONTROLLED WITH YOUR NAUSEA MEDICATION *UNUSUAL SHORTNESS OF BREATH *UNUSUAL BRUISING OR BLEEDING *URINARY PROBLEMS (pain or burning when urinating, or frequent urination) *BOWEL PROBLEMS (unusual diarrhea, constipation, pain near the anus) TENDERNESS IN MOUTH AND THROAT WITH OR WITHOUT PRESENCE OF ULCERS (sore throat, sores in mouth, or a toothache) UNUSUAL RASH, SWELLING OR PAIN  UNUSUAL VAGINAL DISCHARGE OR ITCHING   Items with * indicate a potential emergency and should be followed up as soon as possible or go to the Emergency Department if any problems should occur.  Please show the CHEMOTHERAPY ALERT CARD or IMMUNOTHERAPY ALERT CARD at  check-in to the Emergency Department and triage nurse.  Should you have questions after your visit or need to cancel or reschedule your appointment, please contact [facility]  Dept: [phone]  and follow the prompts.  Office hours are 8:00 a.m. to 4:30 p.m. Monday - Friday. Please note that voicemails left after 4:00 p.m. may not be returned until the following business day.  We are closed weekends and major holidays. You have access to a nurse at all times for urgent questions. Please call the main number to the clinic Dept: [phone] and follow the prompts.   For any non-urgent questions, you may also contact your provider using MyChart. We now offer e-Visits for anyone 18 and older to request care online for non-urgent symptoms. For details visit [email or web address].   Also download the MyChart app! Go to the app store, search "MyChart", open the app, select [facility], and log in with your MyChart username and password.   

## 2022-06-04 ENCOUNTER — Ambulatory Visit (INDEPENDENT_AMBULATORY_CARE_PROVIDER_SITE_OTHER): Payer: Medicare PPO

## 2022-06-04 DIAGNOSIS — I447 Left bundle-branch block, unspecified: Secondary | ICD-10-CM | POA: Diagnosis not present

## 2022-06-04 LAB — CUP PACEART REMOTE DEVICE CHECK
Battery Remaining Longevity: 91 mo
Battery Remaining Percentage: 93 %
Battery Voltage: 3.02 V
Brady Statistic AP VP Percent: 6 %
Brady Statistic AP VS Percent: 1 %
Brady Statistic AS VP Percent: 92 %
Brady Statistic AS VS Percent: 1 %
Brady Statistic RA Percent Paced: 4.9 %
Date Time Interrogation Session: 20240531031820
HighPow Impedance: 64 Ohm
Implantable Lead Connection Status: 753985
Implantable Lead Connection Status: 753985
Implantable Lead Connection Status: 753985
Implantable Lead Implant Date: 20231130
Implantable Lead Implant Date: 20231130
Implantable Lead Implant Date: 20231130
Implantable Lead Location: 753858
Implantable Lead Location: 753859
Implantable Lead Location: 753860
Implantable Pulse Generator Implant Date: 20231130
Lead Channel Impedance Value: 460 Ohm
Lead Channel Impedance Value: 560 Ohm
Lead Channel Impedance Value: 710 Ohm
Lead Channel Pacing Threshold Amplitude: 0.5 V
Lead Channel Pacing Threshold Amplitude: 0.625 V
Lead Channel Pacing Threshold Amplitude: 1.25 V
Lead Channel Pacing Threshold Pulse Width: 0.5 ms
Lead Channel Pacing Threshold Pulse Width: 0.5 ms
Lead Channel Pacing Threshold Pulse Width: 0.8 ms
Lead Channel Sensing Intrinsic Amplitude: 12 mV
Lead Channel Sensing Intrinsic Amplitude: 5 mV
Lead Channel Setting Pacing Amplitude: 1.5 V
Lead Channel Setting Pacing Amplitude: 1.625
Lead Channel Setting Pacing Amplitude: 1.75 V
Lead Channel Setting Pacing Pulse Width: 0.5 ms
Lead Channel Setting Pacing Pulse Width: 0.8 ms
Lead Channel Setting Sensing Sensitivity: 0.5 mV
Pulse Gen Serial Number: 211010483
Zone Setting Status: 755011

## 2022-06-07 DIAGNOSIS — C641 Malignant neoplasm of right kidney, except renal pelvis: Secondary | ICD-10-CM | POA: Diagnosis not present

## 2022-06-18 NOTE — Progress Notes (Deleted)
[facility] OFFICE PROGRESS NOTE  Bryce Cain, Bryce Patricia, MD [address] [place] [address]  DIAGNOSIS: Kidney cancer diagnosed in June 2023.  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    PRIOR THERAPY: Robotic assisted laparoscopic right radical nephrectomy on June 23, 2021.  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     CURRENT THERAPY: Pembrolizumab 200 mg every 3 weeks started on August 19, 2021.  He is here for cycle 15 to complete one year in total 17 cycles.   INTERVAL HISTORY: Bryce Cain 72 y.o. male returns  to the clinic today for a follow-up visit.  The patient is currently being treated with immunotherapy for the patient's history of renal cell carcinoma.  The patient is status post 11 cycles and has been tolerating it fair except for dry mouth, taste alterations, and thyroid dysfunction. At prior appointments, we recommended biotene, nystatin, salt water rinses, and OTC saliva substitutes. He did try all of these recommendations without appreciable improvement. He tries to drink a lot of water.   He sees Dr. Sande Brothers from urology.  He sees them next on *** He does not have a nephrologist for his CKD. I had referred him to nephrology on 03/10/22. He established care with them on ***  Today he denies any changes in his health.  Denies any fever, chills, night sweats, unexplained weight loss.  He reports good appetite. Denies any chest pain, shortness of breath, cough, or hemoptysis.  Denies any new bone pain.  Denies any nausea, vomiting, diarrhea, or constipation.  Denies any dysuria, foul smelling urine, or blood in the urine.  He does urinate frequently since he drinks so much water for his dry mouth. He denies any prohibitive myalgias and arthralgias.  The patient was recently started on Synthroid for hypothyroidism  secondary to his immunotherapy.   He is here today for evaluation and repeat blood work before undergoing cycle #15.     MEDICAL HISTORY: Past Medical History:  Diagnosis Date   CHF (congestive heart failure) (HCC)    DM (diabetes mellitus), type 2 (HCC)    OSA on CPAP     ALLERGIES:  has No Known Allergies.  MEDICATIONS:  Current Outpatient Medications  Medication Sig Dispense Refill   acetaminophen (TYLENOL) 325 MG tablet Take 1-2 tablets (325-650 mg total) by mouth every 4 (four) hours as needed for mild pain.     aspirin EC 81 MG tablet Take 1 tablet (81 mg total) by mouth daily. Swallow whole. 90 tablet 3   empagliflozin (JARDIANCE) 10 MG TABS tablet Take 1 tablet (10 mg total) by mouth daily. 90 tablet 3   isosorbide-hydrALAZINE (BIDIL) 20-37.5 MG tablet TAKE 1 TABLET BY MOUTH THREE TIMES DAILY 90 tablet 6   levothyroxine (SYNTHROID) 50 MCG tablet TAKE 1 TABLET(50 MCG) BY MOUTH DAILY BEFORE BREAKFAST 90 tablet 1   nystatin (MYCOSTATIN) 100000 UNIT/ML suspension Take 5 mLs (500,000 Units total) by mouth 4 (four) times daily. (Patient not taking: Reported on 06/01/2022) 60 mL 0   Omega-3 Fatty Acids (FISH OIL) 1000 MG CAPS Take 1,000 mg by mouth daily.     Pembrolizumab (KEYTRUDA IV) Inject 1 Dose into the vein every 21 ( twenty-one) days.     potassium chloride (KLOR-CON M) 10 MEQ tablet TAKE 1 TABLET(10 MEQ) BY MOUTH DAILY 90 tablet 0  PREVIDENT 5000 ENAMEL PROTECT 1.1-5 % GEL Place 1 application. onto teeth at bedtime.     Probiotic Product (UP4 PROBIOTICS ADULT PO) Take 1 capsule by mouth daily.     rosuvastatin (CRESTOR) 40 MG tablet Take 1 tablet (40 mg total) by mouth daily. 90 tablet 3   sodium bicarbonate 650 MG tablet Take 650 mg by mouth 3 (three) times daily.     torsemide (DEMADEX) 20 MG tablet TAKE 1 TABLET BY MOUTH DAILY 30 tablet 6   No current facility-administered medications for this visit.    SURGICAL HISTORY:  Past Surgical History:  Procedure Laterality  Date   BIV ICD INSERTION CRT-D N/A 12/03/2021   Procedure: BIV ICD INSERTION CRT-D;  Surgeon: Lanier Prude, MD;  Location: [facility] INVASIVE CV LAB;  Service: Cardiovascular;  Laterality: N/A;   RIGHT/LEFT HEART CATH AND CORONARY ANGIOGRAPHY N/A 02/16/2021   Procedure: RIGHT/LEFT HEART CATH AND CORONARY ANGIOGRAPHY;  Surgeon: Dolores Patty, MD;  Location: [facility] INVASIVE CV LAB;  Service: Cardiovascular;  Laterality: N/A;   ROBOT ASSISTED LAPAROSCOPIC NEPHRECTOMY Right 06/23/2021   Procedure: XI ROBOTIC ASSISTED LAPAROSCOPIC NEPHRECTOMY;  Surgeon: Rene Paci, MD;  Location: [facility];  Service: Urology;  Laterality: Right;    REVIEW OF SYSTEMS:   Review of Systems  Constitutional: Negative for appetite change, chills, fatigue, fever and unexpected weight change.  HENT:   Negative for mouth sores, nosebleeds, sore throat and trouble swallowing.   Eyes: Negative for eye problems and icterus.  Respiratory: Negative for cough, hemoptysis, shortness of breath and wheezing.   Cardiovascular: Negative for chest pain and leg swelling.  Gastrointestinal: Negative for abdominal pain, constipation, diarrhea, nausea and vomiting.  Genitourinary: Negative for bladder incontinence, difficulty urinating, dysuria, frequency and hematuria.   Musculoskeletal: Negative for back pain, gait problem, neck pain and neck stiffness.  Skin: Negative for itching and rash.  Neurological: Negative for dizziness, extremity weakness, gait problem, headaches, light-headedness and seizures.  Hematological: Negative for adenopathy. Does not bruise/bleed easily.  Psychiatric/Behavioral: Negative for confusion, depression and sleep disturbance. The patient is not nervous/anxious.     PHYSICAL EXAMINATION:  There were no vitals taken for this visit.  ECOG PERFORMANCE STATUS: {CHL ONC ECOG [id number]  Physical Exam  Constitutional: Oriented to person, place, and time and well-developed, well-nourished, and  in no distress. No distress.  HENT:  Head: Normocephalic and atraumatic.  Mouth/Throat: Oropharynx is clear and moist. No oropharyngeal exudate.  Eyes: Conjunctivae are normal. Right eye exhibits no discharge. Left eye exhibits no discharge. No scleral icterus.  Neck: Normal range of motion. Neck supple.  Cardiovascular: Normal rate, regular rhythm, normal heart sounds and intact distal pulses.   Pulmonary/Chest: Effort normal and breath sounds normal. No respiratory distress. No wheezes. No rales.  Abdominal: Soft. Bowel sounds are normal. Exhibits no distension and no mass. There is no tenderness.  Musculoskeletal: Normal range of motion. Exhibits no edema.  Lymphadenopathy:    No cervical adenopathy.  Neurological: Alert and oriented to person, place, and time. Exhibits normal muscle tone. Gait normal. Coordination normal.  Skin: Skin is warm and dry. No rash noted. Not diaphoretic. No erythema. No pallor.  Psychiatric: Mood, memory and judgment normal.  Vitals reviewed.  LABORATORY DATA: Lab Results  Component Value Date   WBC 5.0 06/02/2022   HGB 11.9 (L) 06/02/2022   HCT 33.6 (L) 06/02/2022   MCV 81.8 06/02/2022   PLT 138 (L) 06/02/2022      Chemistry  Component Value Date/Time   NA 136 06/02/2022 1348   NA 137 05/10/2022 0000   K 4.6 06/02/2022 1348   CL 104 06/02/2022 1348   CO2 25 06/02/2022 1348   BUN 60 (H) 06/02/2022 1348   BUN 66 (A) 05/10/2022 0000   CREATININE 3.33 (H) 06/02/2022 1348   GLU 94 05/10/2022 0000      Component Value Date/Time   CALCIUM 10.1 06/02/2022 1348   ALKPHOS 33 (L) 06/02/2022 1348   AST 37 06/02/2022 1348   ALT 16 06/02/2022 1348   BILITOT 0.6 06/02/2022 1348       RADIOGRAPHIC STUDIES:  CUP PACEART REMOTE DEVICE CHECK  Result Date: 06/04/2022 Scheduled remote reviewed. Normal device function.  Next remote 91 days. [place], [facility]    ASSESSMENT/PLAN:  This is a very pleasant 72 year old African American male diagnosed with  right sided renal cell carcinoma in June 2023.  He was found to have a T3a clear-cell without any evidence of metastatic disease.   He underwent robotic assisted laparoscopic right radical nephrectomy on 06/23/21.   He is currently on Keytruda 200 mg IV every 3 weeks.  He is tolerating this well.  He is status post 14 cycles.  The plan is to complete 1 year of treatment which will be 17 cycles.   Labs were reviewed.  Recommend that he proceed with cycle #15 today as schedule.  The patient has CKD.  Okay to treat with a creatinine of *** today. He was previously referred to nephrology. ***?? upcoming appointment.     He has hypotension due to his CHF. His BP is stable today. He is ok to treat with his BP of ***.    He will continue taking Synthroid for his hypothyroidism.   We will see him back for follow-up visit in 3 weeks for evaluation repeat blood work before undergoing cycle #12.   For his taste alterations, He was also encouraged to do salt water rinses, biotene, and make sure he brushes his tongue. Discussed OTC products for dry mouth such as salivasure or other saliva substitutes. ***nystatin  Urology?   The patient was advised to call immediately if he has any concerning symptoms in the interval. The patient voices understanding of current disease status and treatment options and is in agreement with the current care plan. All questions were answered. The patient knows to call the clinic with any problems, questions or concerns. We can certainly see the patient much sooner if necessary        No orders of the defined types were placed in this encounter.    I spent {CHL ONC TIME VISIT - [id number]} counseling the patient face to face. The total time spent in the appointment was {CHL ONC TIME VISIT - BJYNW:[id number]}.  Josephanthony Tindel L Noma Quijas, PA-C 06/18/22

## 2022-06-22 ENCOUNTER — Inpatient Hospital Stay: Payer: Medicare PPO

## 2022-06-22 ENCOUNTER — Inpatient Hospital Stay: Payer: Medicare PPO | Attending: Internal Medicine

## 2022-06-22 ENCOUNTER — Encounter: Payer: Self-pay | Admitting: Medical Oncology

## 2022-06-22 ENCOUNTER — Inpatient Hospital Stay (HOSPITAL_BASED_OUTPATIENT_CLINIC_OR_DEPARTMENT_OTHER): Payer: Medicare PPO | Admitting: Internal Medicine

## 2022-06-22 DIAGNOSIS — C641 Malignant neoplasm of right kidney, except renal pelvis: Secondary | ICD-10-CM

## 2022-06-22 DIAGNOSIS — Z5112 Encounter for antineoplastic immunotherapy: Secondary | ICD-10-CM | POA: Insufficient documentation

## 2022-06-22 DIAGNOSIS — Z79899 Other long term (current) drug therapy: Secondary | ICD-10-CM | POA: Diagnosis not present

## 2022-06-22 DIAGNOSIS — Z905 Acquired absence of kidney: Secondary | ICD-10-CM | POA: Diagnosis not present

## 2022-06-22 LAB — CBC WITH DIFFERENTIAL (CANCER CENTER ONLY)
Abs Immature Granulocytes: 0.03 10*3/uL (ref 0.00–0.07)
Basophils Absolute: 0.1 10*3/uL (ref 0.0–0.1)
Basophils Relative: 1 %
Eosinophils Absolute: 0.3 10*3/uL (ref 0.0–0.5)
Eosinophils Relative: 6 %
HCT: 34.8 % — ABNORMAL LOW (ref 39.0–52.0)
Hemoglobin: 12.2 g/dL — ABNORMAL LOW (ref 13.0–17.0)
Immature Granulocytes: 1 %
Lymphocytes Relative: 19 %
Lymphs Abs: 1 10*3/uL (ref 0.7–4.0)
MCH: 28.5 pg (ref 26.0–34.0)
MCHC: 35.1 g/dL (ref 30.0–36.0)
MCV: 81.3 fL (ref 80.0–100.0)
Monocytes Absolute: 0.7 10*3/uL (ref 0.1–1.0)
Monocytes Relative: 14 %
Neutro Abs: 3 10*3/uL (ref 1.7–7.7)
Neutrophils Relative %: 59 %
Platelet Count: 141 10*3/uL — ABNORMAL LOW (ref 150–400)
RBC: 4.28 MIL/uL (ref 4.22–5.81)
RDW: 14.3 % (ref 11.5–15.5)
WBC Count: 5.1 10*3/uL (ref 4.0–10.5)
nRBC: 0 % (ref 0.0–0.2)

## 2022-06-22 LAB — CMP (CANCER CENTER ONLY)
ALT: 14 U/L (ref 0–44)
AST: 34 U/L (ref 15–41)
Albumin: 4.3 g/dL (ref 3.5–5.0)
Alkaline Phosphatase: 34 U/L — ABNORMAL LOW (ref 38–126)
Anion gap: 9 (ref 5–15)
BUN: 65 mg/dL — ABNORMAL HIGH (ref 8–23)
CO2: 25 mmol/L (ref 22–32)
Calcium: 10.5 mg/dL — ABNORMAL HIGH (ref 8.9–10.3)
Chloride: 102 mmol/L (ref 98–111)
Creatinine: 3.26 mg/dL — ABNORMAL HIGH (ref 0.61–1.24)
GFR, Estimated: 19 mL/min — ABNORMAL LOW (ref >60)
Glucose, Bld: 100 mg/dL — ABNORMAL HIGH (ref 70–99)
Potassium: 4.4 mmol/L (ref 3.5–5.1)
Sodium: 136 mmol/L (ref 135–145)
Total Bilirubin: 0.6 mg/dL (ref 0.3–1.2)
Total Protein: 7.3 g/dL (ref 6.5–8.1)

## 2022-06-22 MED ORDER — SODIUM CHLORIDE 0.9 % IV SOLN
200.0000 mg | Freq: Once | INTRAVENOUS | Status: AC
  Administered 2022-06-22: 200 mg via INTRAVENOUS
  Filled 2022-06-22: qty 200

## 2022-06-22 MED ORDER — SODIUM CHLORIDE 0.9 % IV SOLN: Freq: Once | INTRAVENOUS | Status: AC

## 2022-06-22 NOTE — Progress Notes (Signed)
[facility] [phone]   [phone]  OFFICE PROGRESS NOTE  Philip Aspen, [name], MD [address] 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      PRIOR THERAPY: Robotic assisted laparoscopic right radical nephrectomy on June 23, 2021.  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      CURRENT THERAPY: Pembrolizumab 200 mg every 3 weeks started on August 19, 2021.  Status post 14 cycles.  INTERVAL HISTORY: Bryce Cain 72 y.o. male returns to the clinic today for follow-up visit.  The patient is feeling fine today with no concerning complaints except for the dry mouth and also some dryness in his hand with itching.  He denied having chest pain, shortness of breath, cough or hemoptysis.  He has no nausea, vomiting, diarrhea or constipation.  He has no headache or visual changes.  He has no recent weight loss or night sweats.  He is here today for evaluation before starting cycle #15 of his treatment.  MEDICAL HISTORY: Past Medical History:  Diagnosis Date   CHF (congestive heart failure) (HCC)    DM (diabetes mellitus), type 2 (HCC)    OSA on CPAP     ALLERGIES:  has No Known Allergies.  MEDICATIONS:  Current Outpatient Medications  Medication Sig Dispense Refill   acetaminophen (TYLENOL) 325 MG tablet Take 1-2 tablets (325-650 mg total) by mouth every 4 (four) hours as needed for mild pain.     aspirin EC 81 MG tablet Take 1 tablet (81 mg total) by mouth daily. Swallow whole. 90 tablet 3   empagliflozin (JARDIANCE) 10 MG TABS tablet Take 1 tablet (10 mg total) by mouth daily. 90 tablet 3   isosorbide-hydrALAZINE (BIDIL) 20-37.5 MG tablet TAKE 1 TABLET BY MOUTH THREE TIMES DAILY 90 tablet 6    levothyroxine (SYNTHROID) 50 MCG tablet TAKE 1 TABLET(50 MCG) BY MOUTH DAILY BEFORE BREAKFAST 90 tablet 1   nystatin (MYCOSTATIN) 100000 UNIT/ML suspension Take 5 mLs (500,000 Units total) by mouth 4 (four) times daily. (Patient not taking: Reported on 06/01/2022) 60 mL 0   Omega-3 Fatty Acids (FISH OIL) 1000 MG CAPS Take 1,000 mg by mouth daily.     Pembrolizumab (KEYTRUDA IV) Inject 1 Dose into the vein every 21 ( twenty-one) days.     potassium chloride (KLOR-CON M) 10 MEQ tablet TAKE 1 TABLET(10 MEQ) BY MOUTH DAILY 90 tablet 0   PREVIDENT 5000 ENAMEL PROTECT 1.1-5 % GEL Place 1 application. onto teeth at bedtime.     Probiotic Product (UP4 PROBIOTICS ADULT PO) Take 1 capsule by mouth daily.     rosuvastatin (CRESTOR) 40 MG tablet Take 1 tablet (40 mg total) by mouth daily. 90 tablet 3   sodium bicarbonate 650 MG tablet Take 650 mg by mouth 3 (three) times daily.     torsemide (DEMADEX) 20 MG tablet TAKE 1 TABLET BY MOUTH DAILY 30 tablet 6   No current facility-administered medications for this visit.    SURGICAL HISTORY:  Past Surgical History:  Procedure Laterality Date   BIV ICD INSERTION CRT-D N/A 12/03/2021   Procedure: BIV ICD INSERTION CRT-D;  Surgeon: Lanier Prude, MD;  Location: [facility] INVASIVE CV LAB;  Service: Cardiovascular;  Laterality: N/A;  RIGHT/LEFT HEART CATH AND CORONARY ANGIOGRAPHY N/A 02/16/2021   Procedure: RIGHT/LEFT HEART CATH AND CORONARY ANGIOGRAPHY;  Surgeon: Dolores Patty, MD;  Location: [facility] INVASIVE CV LAB;  Service: Cardiovascular;  Laterality: N/A;   ROBOT ASSISTED LAPAROSCOPIC NEPHRECTOMY Right 06/23/2021   Procedure: XI ROBOTIC ASSISTED LAPAROSCOPIC NEPHRECTOMY;  Surgeon: Rene Paci, MD;  Location: [facility];  Service: Urology;  Laterality: Right;    REVIEW OF SYSTEMS:  A comprehensive review of systems was negative except for: Ears, nose, mouth, throat, and face: positive for dry mouth    PHYSICAL EXAMINATION: General appearance: alert,  cooperative, and no distress Head: Normocephalic, without obvious abnormality, atraumatic Neck: no adenopathy, no JVD, supple, symmetrical, trachea midline, and thyroid not enlarged, symmetric, no tenderness/mass/nodules Lymph nodes: Cervical, supraclavicular, and axillary nodes normal. Resp: clear to auscultation bilaterally Back: symmetric, no curvature. ROM normal. No CVA tenderness. Cardio: regular rate and rhythm, S1, S2 normal, no murmur, click, rub or gallop GI: soft, non-tender; bowel sounds normal; no masses,  no organomegaly Extremities: extremities normal, atraumatic, no cyanosis or edema  ECOG PERFORMANCE STATUS: 1 - Symptomatic but completely ambulatory  Blood pressure 96/63, pulse 77, temperature 97.8 F (36.6 C), temperature source Oral, resp. rate 17, weight 235 lb 4.8 oz (106.7 kg), SpO2 100 %.  LABORATORY DATA: Lab Results  Component Value Date   WBC 5.0 06/02/2022   HGB 11.9 (L) 06/02/2022   HCT 33.6 (L) 06/02/2022   MCV 81.8 06/02/2022   PLT 138 (L) 06/02/2022      Chemistry      Component Value Date/Time   NA 136 06/02/2022 1348   NA 137 05/10/2022 0000   K 4.6 06/02/2022 1348   CL 104 06/02/2022 1348   CO2 25 06/02/2022 1348   BUN 60 (H) 06/02/2022 1348   BUN 66 (A) 05/10/2022 0000   CREATININE 3.33 (H) 06/02/2022 1348   GLU 94 05/10/2022 0000      Component Value Date/Time   CALCIUM 10.1 06/02/2022 1348   ALKPHOS 33 (L) 06/02/2022 1348   AST 37 06/02/2022 1348   ALT 16 06/02/2022 1348   BILITOT 0.6 06/02/2022 1348       RADIOGRAPHIC STUDIES: CUP PACEART REMOTE DEVICE CHECK  Result Date: 06/04/2022 Scheduled remote reviewed. Normal device function.  Next remote 91 days. 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   He is a status  post robotic assisted laparoscopic right radical nephrectomy on June 23, 2021.  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 The patient is currently undergoing adjuvant treatment with immunotherapy with Keytruda 200 Mg IV every 3 weeks status post 14 cycles.   The patient has been tolerating this treatment well except for the dry mouth and recently some itching and dryness in his hands. I recommended for him to proceed with cycle #15 today as planned. He will come back for follow-up visit in 3 weeks for evaluation before starting cycle #16. The patient was advised to call immediately if he has any other concerning symptoms in the interval. The patient voices understanding of current disease status and treatment options and is in agreement with the current care plan.  All questions were answered. The patient knows to call the  clinic with any problems, questions or concerns. We can certainly see the patient much sooner if necessary.  The total time spent in the appointment was 20 minutes.  Disclaimer: This note was dictated with voice recognition software. Similar sounding words can inadvertently be transcribed and may not be corrected upon review.

## 2022-06-22 NOTE — Progress Notes (Signed)
Patient seen by Dr. Gypsy Balsam are within treatment parameters.  Labs reviewed: and are not all within treatment parameters. Creatinine is 3.26. Per Dr. Arbutus Ped it is okay  to treat pt today with  Pembrolizumab and creatinine of 3.26.  Per physician team, patient is ready for treatment and there are NO modifications to the treatment plan.

## 2022-06-22 NOTE — Progress Notes (Signed)
Remote ICD transmission.   

## 2022-06-22 NOTE — Patient Instructions (Signed)
[facility]  Discharge Instructions: Thank you for choosing [facility] to provide your oncology and hematology care.   If you have a lab appointment with the Cancer Center, please go directly to the Cancer Center and check in at the registration area.   Wear comfortable clothing and clothing appropriate for easy access to any Portacath or PICC line.   We strive to give you quality time with your provider. You may need to reschedule your appointment if you arrive late (15 or more minutes).  Arriving late affects you and other patients whose appointments are after yours.  Also, if you miss three or more appointments without notifying the office, you may be dismissed from the clinic at the provider's discretion.      For prescription refill requests, have your pharmacy contact our office and allow 72 hours for refills to be completed.    Today you received the following chemotherapy and/or immunotherapy agents keytruda      To help prevent nausea and vomiting after your treatment, we encourage you to take your nausea medication as directed.  BELOW ARE SYMPTOMS THAT SHOULD BE REPORTED IMMEDIATELY: *FEVER GREATER THAN 100.4 F (38 C) OR HIGHER *CHILLS OR SWEATING *NAUSEA AND VOMITING THAT IS NOT CONTROLLED WITH YOUR NAUSEA MEDICATION *UNUSUAL SHORTNESS OF BREATH *UNUSUAL BRUISING OR BLEEDING *URINARY PROBLEMS (pain or burning when urinating, or frequent urination) *BOWEL PROBLEMS (unusual diarrhea, constipation, pain near the anus) TENDERNESS IN MOUTH AND THROAT WITH OR WITHOUT PRESENCE OF ULCERS (sore throat, sores in mouth, or a toothache) UNUSUAL RASH, SWELLING OR PAIN  UNUSUAL VAGINAL DISCHARGE OR ITCHING   Items with * indicate a potential emergency and should be followed up as soon as possible or go to the Emergency Department if any problems should occur.  Please show the CHEMOTHERAPY ALERT CARD or IMMUNOTHERAPY ALERT CARD at  check-in to the Emergency Department and triage nurse.  Should you have questions after your visit or need to cancel or reschedule your appointment, please contact [facility]  Dept: [phone]  and follow the prompts.  Office hours are 8:00 a.m. to 4:30 p.m. Monday - Friday. Please note that voicemails left after 4:00 p.m. may not be returned until the following business day.  We are closed weekends and major holidays. You have access to a nurse at all times for urgent questions. Please call the main number to the clinic Dept: [phone] and follow the prompts.   For any non-urgent questions, you may also contact your provider using MyChart. We now offer e-Visits for anyone 18 and older to request care online for non-urgent symptoms. For details visit [email or web address].   Also download the MyChart app! Go to the app store, search "MyChart", open the app, select [place], and log in with your MyChart username and password.   

## 2022-06-23 ENCOUNTER — Ambulatory Visit: Payer: Medicare PPO | Admitting: Internal Medicine

## 2022-06-23 ENCOUNTER — Telehealth: Payer: Self-pay | Admitting: Internal Medicine

## 2022-06-23 ENCOUNTER — Ambulatory Visit: Payer: Medicare PPO | Admitting: Physician Assistant

## 2022-06-23 ENCOUNTER — Inpatient Hospital Stay: Payer: Medicare PPO

## 2022-06-23 ENCOUNTER — Inpatient Hospital Stay: Payer: Medicare PPO | Admitting: Physician Assistant

## 2022-06-23 LAB — T4: T4, Total: 4.7 ug/dL (ref 4.5–12.0)

## 2022-06-23 NOTE — Telephone Encounter (Signed)
Called patient regarding July appointments, left a voicemail. 

## 2022-06-24 LAB — TSH: TSH: 47.923 u[IU]/mL — ABNORMAL HIGH (ref 0.350–4.500)

## 2022-07-06 ENCOUNTER — Encounter: Payer: Self-pay | Admitting: Internal Medicine

## 2022-07-07 ENCOUNTER — Other Ambulatory Visit: Payer: Self-pay | Admitting: Internal Medicine

## 2022-07-08 NOTE — Progress Notes (Signed)
[facility] OFFICE PROGRESS NOTE  Philip Aspen, [name], MD [address] [place] [address]  DIAGNOSIS: Kidney cancer diagnosed in June 2023.  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    PRIOR THERAPY:  Robotic assisted laparoscopic right radical nephrectomy on June 23, 2021.  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     CURRENT THERAPY:  Pembrolizumab 200 mg every 3 weeks started on August 19, 2021.  He is here for cycle 16 to complete one year in total 17 cycles.   INTERVAL HISTORY: Bryce Cain 72 y.o. male returns to the clinic today for a follow-up visit. The patient is currently being treated with immunotherapy for the patient's history of renal cell carcinoma. The patient is status post 15 cycles and has been tolerating it fair except for dry mouth, taste alterations, and thyroid dysfunction. At prior appointments, we recommended biotene, salt water rinses, and OTC saliva substitutes. He did try all of these recommendations without appreciable improvement. He tries to drink a lot of water, especially since several of his medications can cause dehydration. He is here for his second to last treatment today.   I previously referred him to nephrology for his CKD. He did establish care. They are going to monitor him every 6 months. He sees Dr. Sande Brothers from urology also sees them every 6 months.   He mentions sometimes he has itching in between his fingers without any rashes or dry skin. He also mentions that if his belt is too tight, it causes abdominal discomfort/cramping which improves with readjusting his belt or loosening his belt.   Denies any fever, chills, night sweats, unexplained weight loss.  He reports good appetite. Denies any chest pain, shortness of breath, cough, or hemoptysis.  Denies any new bone pain.   Denies any nausea, vomiting, diarrhea, or constipation.  Denies any dysuria, foul smelling urine, or blood in the urine.  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  He is here today for evaluation and repeat blood work before undergoing cycle #16.     MEDICAL HISTORY: Past Medical History:  Diagnosis Date   CHF (congestive heart failure) (HCC)    DM (diabetes mellitus), type 2 (HCC)    OSA on CPAP     ALLERGIES:  has No Known Allergies.  MEDICATIONS:  Current Outpatient Medications  Medication Sig Dispense Refill   acetaminophen (TYLENOL) 325 MG tablet Take 1-2 tablets (325-650 mg total) by mouth every 4 (four) hours as needed for mild pain.     aspirin EC 81 MG tablet Take 1 tablet (81 mg total) by mouth daily. Swallow whole. 90 tablet 3   empagliflozin (JARDIANCE) 10 MG TABS tablet Take 1 tablet (10 mg total) by mouth daily. 90 tablet 3   isosorbide-hydrALAZINE (BIDIL) 20-37.5 MG tablet TAKE 1 TABLET BY MOUTH THREE TIMES DAILY 90 tablet 6   levothyroxine (SYNTHROID) 50 MCG tablet TAKE 1 TABLET(50 MCG) BY MOUTH DAILY BEFORE BREAKFAST 90 tablet 1   nystatin (MYCOSTATIN) 100000 UNIT/ML suspension Take 5 mLs (500,000 Units total) by mouth 4 (four) times daily. (Patient not taking: Reported on 06/01/2022) 60 mL 0   Omega-3 Fatty Acids (FISH OIL) 1000 MG CAPS Take 1,000 mg by mouth daily.     Pembrolizumab (KEYTRUDA IV) Inject 1 Dose into the vein every 21 ( twenty-one)  days.     potassium chloride (KLOR-CON M) 10 MEQ tablet TAKE 1 TABLET(10 MEQ) BY MOUTH DAILY 90 tablet 0   PREVIDENT 5000 ENAMEL PROTECT 1.1-5 % GEL Place 1 application. onto teeth at bedtime.     Probiotic Product (UP4 PROBIOTICS ADULT PO) Take 1 capsule by mouth daily.     rosuvastatin (CRESTOR) 40 MG tablet Take 1 tablet (40 mg total) by mouth daily. 90 tablet 3   sodium bicarbonate 650 MG tablet Take 650 mg by mouth 3 (three) times daily.     torsemide (DEMADEX) 20 MG tablet  TAKE 1 TABLET BY MOUTH DAILY 30 tablet 6   No current facility-administered medications for this visit.    SURGICAL HISTORY:  Past Surgical History:  Procedure Laterality Date   BIV ICD INSERTION CRT-D N/A 12/03/2021   Procedure: BIV ICD INSERTION CRT-D;  Surgeon: Lanier Prude, MD;  Location: [facility] INVAS[facility] CV LAB;  Service: Cardiovascular;  Laterality: N/A;   RIGHT/LEFT HEART CATH AND CORONARY ANGIOGRAPHY N/A 02/16/2021   Procedure: RIGHT/LEFT HEART CATH AND CORONARY ANGIOGRAPHY;  Surgeon: Dolores Patty, MD;  Location: [facility] INVASIVE CV LAB;  Service: Cardiovascular;  Laterality: N/A;   ROBOT ASSISTED LAPAROSCOPIC NEPHRECTOMY Right 06/23/2021   Procedure: XI ROBOTIC ASSISTED LAPAROSCOPIC NEPHRECTOMY;  Surgeon: Rene Paci, MD;  Location: [facility];  Service: Urology;  Laterality: Right;    REVIEW OF SYSTEMS:   Review of Systems  Constitutional: Negative for appetite change, chills, fatigue, fever and unexpected weight change.  HENT: Positive for dry mouth and taste alterations. Negative for mouth sores, nosebleeds, sore throat and trouble swallowing.  Eyes: Negative for eye problems and icterus.  Respiratory: Negative for cough, hemoptysis, shortness of breath and wheezing.   Cardiovascular: Negative for chest pain and leg swelling.  Gastrointestinal: Negative for abdominal pain, constipation, diarrhea, nausea and vomiting.  Genitourinary: Negative for bladder incontinence, difficulty urinating, dysuria, frequency and hematuria.   Musculoskeletal: Negative for back pain, gait problem, neck pain and neck stiffness.  Skin: Positive for itching between his fingers occasionally. Negative for rash.  Neurological: Negative for dizziness, extremity weakness, gait problem, headaches, light-headedness and seizures.  Hematological: Negative for adenopathy. Does not bruise/bleed easily.  Psychiatric/Behavioral: Negative for confusion, depression and sleep disturbance. The patient is  not nervous/anxious.     PHYSICAL EXAMINATION:  There were no vitals taken for this visit.  ECOG PERFORMANCE STATUS: 1  Physical Exam  Constitutional: Oriented to person, place, and time and well-developed, well-nourished, and in no distress.  HENT:  Head: Normocephalic and atraumatic.  Mouth/Throat: Oropharynx is dry appearing. No oropharyngeal exudate.  Eyes: Conjunctivae are normal. Right eye exhibits no discharge. Left eye exhibits no discharge. No scleral icterus.  Neck: Normal range of motion. Neck supple.  Cardiovascular: Normal rate, regular rhythm, normal heart sounds and intact distal pulses.   Pulmonary/Chest: Effort normal and breath sounds normal. No respiratory distress. No wheezes. No rales.  Abdominal: Soft. Bowel sounds are normal. Exhibits no distension and no mass. There is no tenderness.  Musculoskeletal: Normal range of motion. Exhibits no edema.  Lymphadenopathy:    No cervical adenopathy.  Neurological: Alert and oriented to person, place, and time. Exhibits normal muscle tone. Gait normal. Coordination normal.  Skin: Skin is warm and dry. No rash noted. Not diaphoretic. No erythema. No pallor.  Psychiatric: Mood, memory and judgment normal.  Vitals reviewed.  LABORATORY DATA: Lab Results  Component Value Date   WBC 5.1 06/22/2022   HGB 12.2 (L) 06/22/2022  HCT 34.8 (L) 06/22/2022   MCV 81.3 06/22/2022   PLT 141 (L) 06/22/2022      Chemistry      Component Value Date/Time   NA 136 06/22/2022 1449   NA 137 05/10/2022 0000   K 4.4 06/22/2022 1449   CL 102 06/22/2022 1449   CO2 25 06/22/2022 1449   BUN 65 (H) 06/22/2022 1449   BUN 66 (A) 05/10/2022 0000   CREATININE 3.26 (H) 06/22/2022 1449   GLU 94 05/10/2022 0000      Component Value Date/Time   CALCIUM 10.5 (H) 06/22/2022 1449   ALKPHOS 34 (L) 06/22/2022 1449   AST 34 06/22/2022 1449   ALT 14 06/22/2022 1449   BILITOT 0.6 06/22/2022 1449       RADIOGRAPHIC STUDIES:  No results  found.   ASSESSMENT/PLAN:  This is a very pleasant 72 year old African American male diagnosed with right sided renal cell carcinoma in June 2023.  He was found to have a T3a clear-cell without any evidence of metastatic disease.   He underwent robotic assisted laparoscopic right radical nephrectomy on 06/23/21.   He is currently on Keytruda 200 mg IV every 3 weeks.  He is tolerating this well.  He is status post 15 cycles.  The plan is to complete 1 year of treatment which will be 17 cycles.   Labs were reviewed.  Recommend that he proceed with cycle #16 today as schedule.  The patient has CKD.  Okay to treat with a creatinine of 3.28 today, which is near his baseline. He was previously referred to nephrology and follows every 6 months.   He will continue taking Synthroid for his hypothyroidism.I have added TSH and T4 to his next lab draw so we can check to see if dose adjustment is needed of his synthroid.   We will see him back for follow-up visit in 3 weeks for evaluation repeat blood work before undergoing  his last cycle #17. I will likely arrange or repeat imaging studies when I see him at his next appointment.   Advised to use hydrocortisone cream for itching between the fingers if needed. Immunotherapy can cause itching without rash. Also advised to use Claritin or zyrtec. He cannot take benadryl due to drowsiness.   He will continue to try to hydrate for the dry mouth.   The patient was advised to call immediately if he has any concerning symptoms in the interval. The patient voices understanding of current disease status and treatment options and is in agreement with the current care plan. All questions were answered. The patient knows to call the clinic with any problems, questions or concerns. We can certainly see the patient much sooner if necessary         No orders of the defined types were placed in this encounter.    The total time spent in the appointment was 20-29  minutes.   Mikki Ziff L Jackelin Correia, PA-C 07/08/22

## 2022-07-13 ENCOUNTER — Inpatient Hospital Stay: Payer: Medicare PPO | Admitting: Physician Assistant

## 2022-07-13 ENCOUNTER — Inpatient Hospital Stay: Payer: Medicare PPO | Attending: Internal Medicine

## 2022-07-13 ENCOUNTER — Inpatient Hospital Stay: Payer: Medicare PPO

## 2022-07-13 ENCOUNTER — Other Ambulatory Visit: Payer: Self-pay

## 2022-07-13 VITALS — BP 110/67 | HR 83 | Temp 97.7°F | Resp 15 | Wt 239.1 lb

## 2022-07-13 VITALS — BP 116/71 | HR 73 | Resp 16

## 2022-07-13 DIAGNOSIS — Z5112 Encounter for antineoplastic immunotherapy: Secondary | ICD-10-CM | POA: Diagnosis not present

## 2022-07-13 DIAGNOSIS — Z7982 Long term (current) use of aspirin: Secondary | ICD-10-CM | POA: Diagnosis not present

## 2022-07-13 DIAGNOSIS — Z79899 Other long term (current) drug therapy: Secondary | ICD-10-CM | POA: Insufficient documentation

## 2022-07-13 DIAGNOSIS — E039 Hypothyroidism, unspecified: Secondary | ICD-10-CM | POA: Diagnosis not present

## 2022-07-13 DIAGNOSIS — R682 Dry mouth, unspecified: Secondary | ICD-10-CM | POA: Insufficient documentation

## 2022-07-13 DIAGNOSIS — C641 Malignant neoplasm of right kidney, except renal pelvis: Secondary | ICD-10-CM | POA: Diagnosis not present

## 2022-07-13 DIAGNOSIS — Z905 Acquired absence of kidney: Secondary | ICD-10-CM | POA: Diagnosis not present

## 2022-07-13 DIAGNOSIS — N189 Chronic kidney disease, unspecified: Secondary | ICD-10-CM | POA: Diagnosis not present

## 2022-07-13 LAB — CBC WITH DIFFERENTIAL (CANCER CENTER ONLY)
Abs Immature Granulocytes: 0.02 10*3/uL (ref 0.00–0.07)
Basophils Absolute: 0.1 10*3/uL (ref 0.0–0.1)
Basophils Relative: 1 %
Eosinophils Absolute: 0.2 10*3/uL (ref 0.0–0.5)
Eosinophils Relative: 4 %
HCT: 33.3 % — ABNORMAL LOW (ref 39.0–52.0)
Hemoglobin: 11.7 g/dL — ABNORMAL LOW (ref 13.0–17.0)
Immature Granulocytes: 0 %
Lymphocytes Relative: 14 %
Lymphs Abs: 0.7 10*3/uL (ref 0.7–4.0)
MCH: 28.7 pg (ref 26.0–34.0)
MCHC: 35.1 g/dL (ref 30.0–36.0)
MCV: 81.8 fL (ref 80.0–100.0)
Monocytes Absolute: 0.8 10*3/uL (ref 0.1–1.0)
Monocytes Relative: 15 %
Neutro Abs: 3.5 10*3/uL (ref 1.7–7.7)
Neutrophils Relative %: 66 %
Platelet Count: 124 10*3/uL — ABNORMAL LOW (ref 150–400)
RBC: 4.07 MIL/uL — ABNORMAL LOW (ref 4.22–5.81)
RDW: 14.3 % (ref 11.5–15.5)
WBC Count: 5.3 10*3/uL (ref 4.0–10.5)
nRBC: 0 % (ref 0.0–0.2)

## 2022-07-13 LAB — CMP (CANCER CENTER ONLY)
ALT: 14 U/L (ref 0–44)
AST: 34 U/L (ref 15–41)
Albumin: 4.1 g/dL (ref 3.5–5.0)
Alkaline Phosphatase: 35 U/L — ABNORMAL LOW (ref 38–126)
Anion gap: 7 (ref 5–15)
BUN: 67 mg/dL — ABNORMAL HIGH (ref 8–23)
CO2: 25 mmol/L (ref 22–32)
Calcium: 10.1 mg/dL (ref 8.9–10.3)
Chloride: 103 mmol/L (ref 98–111)
Creatinine: 3.28 mg/dL — ABNORMAL HIGH (ref 0.61–1.24)
GFR, Estimated: 19 mL/min — ABNORMAL LOW (ref >60)
Glucose, Bld: 105 mg/dL — ABNORMAL HIGH (ref 70–99)
Potassium: 4.1 mmol/L (ref 3.5–5.1)
Sodium: 135 mmol/L (ref 135–145)
Total Bilirubin: 0.6 mg/dL (ref 0.3–1.2)
Total Protein: 7.5 g/dL (ref 6.5–8.1)

## 2022-07-13 MED ORDER — SODIUM CHLORIDE 0.9 % IV SOLN: Freq: Once | INTRAVENOUS | Status: AC

## 2022-07-13 MED ORDER — SODIUM CHLORIDE 0.9 % IV SOLN
200.0000 mg | Freq: Once | INTRAVENOUS | Status: AC
  Administered 2022-07-13: 200 mg via INTRAVENOUS
  Filled 2022-07-13: qty 200

## 2022-07-13 NOTE — Patient Instructions (Signed)
[facility]  Discharge Instructions: Thank you for choosing [facility] to provide your oncology and hematology care.   If you have a lab appointment with the Cancer Center, please go directly to the Cancer Center and check in at the registration area.   Wear comfortable clothing and clothing appropriate for easy access to any Portacath or PICC line.   We strive to give you quality time with your provider. You may need to reschedule your appointment if you arrive late (15 or more minutes).  Arriving late affects you and other patients whose appointments are after yours.  Also, if you miss three or more appointments without notifying the office, you may be dismissed from the clinic at the provider's discretion.      For prescription refill requests, have your pharmacy contact our office and allow 72 hours for refills to be completed.    Today you received the following chemotherapy and/or immunotherapy agents keytruda      To help prevent nausea and vomiting after your treatment, we encourage you to take your nausea medication as directed.  BELOW ARE SYMPTOMS THAT SHOULD BE REPORTED IMMEDIATELY: *FEVER GREATER THAN 100.4 F (38 C) OR HIGHER *CHILLS OR SWEATING *NAUSEA AND VOMITING THAT IS NOT CONTROLLED WITH YOUR NAUSEA MEDICATION *UNUSUAL SHORTNESS OF BREATH *UNUSUAL BRUISING OR BLEEDING *URINARY PROBLEMS (pain or burning when urinating, or frequent urination) *BOWEL PROBLEMS (unusual diarrhea, constipation, pain near the anus) TENDERNESS IN MOUTH AND THROAT WITH OR WITHOUT PRESENCE OF ULCERS (sore throat, sores in mouth, or a toothache) UNUSUAL RASH, SWELLING OR PAIN  UNUSUAL VAGINAL DISCHARGE OR ITCHING   Items with * indicate a potential emergency and should be followed up as soon as possible or go to the Emergency Department if any problems should occur.  Please show the CHEMOTHERAPY ALERT CARD or IMMUNOTHERAPY ALERT CARD at  check-in to the Emergency Department and triage nurse.  Should you have questions after your visit or need to cancel or reschedule your appointment, please contact [facility]  Dept: [phone]  and follow the prompts.  Office hours are 8:00 a.m. to 4:30 p.m. Monday - Friday. Please note that voicemails left after 4:00 p.m. may not be returned until the following business day.  We are closed weekends and major holidays. You have access to a nurse at all times for urgent questions. Please call the main number to the clinic Dept: [phone] and follow the prompts.   For any non-urgent questions, you may also contact your provider using MyChart. We now offer e-Visits for anyone 18 and older to request care online for non-urgent symptoms. For details visit [email or web address].   Also download the MyChart app! Go to the app store, search "MyChart", open the app, select , and log in with your MyChart username and password.   

## 2022-07-13 NOTE — Progress Notes (Signed)
Per Cassie Heilingoetter, PA-C ok to treat today with a Scr of 3.28.

## 2022-07-14 ENCOUNTER — Other Ambulatory Visit: Payer: Medicare PPO

## 2022-07-14 ENCOUNTER — Ambulatory Visit: Payer: Medicare PPO

## 2022-07-14 ENCOUNTER — Ambulatory Visit: Payer: Medicare PPO | Admitting: Internal Medicine

## 2022-07-30 DIAGNOSIS — E119 Type 2 diabetes mellitus without complications: Secondary | ICD-10-CM | POA: Diagnosis not present

## 2022-07-30 NOTE — Progress Notes (Signed)
[facility] OFFICE PROGRESS NOTE  Bryce Cain, Bryce Patricia, MD [address] [place] [address]  DIAGNOSIS: Kidney cancer diagnosed in June 2023.  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    PRIOR THERAPY: Robotic assisted laparoscopic right radical nephrectomy on June 23, 2021.  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     CURRENT THERAPY: Pembrolizumab 200 mg every 3 weeks started on August 19, 2021.  He is here for cycle 17 to complete one year in total 17 cycles.   INTERVAL HISTORY: Bryce Cain 72 y.o. male returns to the clinic today for a follow-up visit. The patient is currently being treated with immunotherapy for the patient's history of renal cell carcinoma. The patient is status post 16 cycles and has been tolerating it fair except for dry mouth, taste alterations, and thyroid dysfunction. At prior appointments, we recommended biotene, salt water rinses, and OTC saliva substitutes. He did try all of these recommendations without appreciable improvement. He tries to drink a lot of water, especially since several of his medications can cause dehydration. He is here for his second to last treatment today and hopeful that this will improve in time.     I previously referred him to nephrology for his CKD. He did establish care. They are going to monitor him every 6 months. He sees Dr. Sande Brothers from urology also sees them every 6 months.     Denies any fever, chills, night sweats, unexplained weight loss.  He reports good appetite. Denies any chest pain, shortness of breath, cough, or hemoptysis.  Denies any new bone pain.  Denies any nausea, vomiting, diarrhea, or constipation.  Denies any dysuria, foul smelling urine, or blood in the urine.  The patient was started on Synthroid for hypothyroidism secondary to his  immunotherapy.  He is here today for evaluation and repeat blood work before undergoing cycle #17, which is his last cycle of adjuvant immunotherapy. He will have completed one year of treatment. He is hoping that he will not have to come here as frequently.       MEDICAL HISTORY: Past Medical History:  Diagnosis Date   CHF (congestive heart failure) (HCC)    DM (diabetes mellitus), type 2 (HCC)    OSA on CPAP     ALLERGIES:  has No Known Allergies.  MEDICATIONS:  Current Outpatient Medications  Medication Sig Dispense Refill   acetaminophen (TYLENOL) 325 MG tablet Take 1-2 tablets (325-650 mg total) by mouth every 4 (four) hours as needed for mild pain.     aspirin EC 81 MG tablet Take 1 tablet (81 mg total) by mouth daily. Swallow whole. 90 tablet 3   empagliflozin (JARDIANCE) 10 MG TABS tablet Take 1 tablet (10 mg total) by mouth daily. 90 tablet 3   isosorbide-hydrALAZINE (BIDIL) 20-37.5 MG tablet TAKE 1 TABLET BY MOUTH THREE TIMES DAILY 90 tablet 6   levothyroxine (SYNTHROID) 50 MCG tablet TAKE 1 TABLET(50 MCG) BY MOUTH DAILY BEFORE BREAKFAST 90 tablet 1   nystatin (MYCOSTATIN) 100000 UNIT/ML suspension Take 5 mLs (500,000 Units total) by mouth 4 (four) times daily. (Patient not taking: Reported on 06/01/2022) 60 mL 0   Omega-3 Fatty Acids (FISH OIL) 1000 MG CAPS Take 1,000 mg by mouth daily.     Pembrolizumab (KEYTRUDA IV) Inject 1 Dose into the vein every 21 ( twenty-one) days.  potassium chloride (KLOR-CON M) 10 MEQ tablet TAKE 1 TABLET(10 MEQ) BY MOUTH DAILY 90 tablet 0   PREVIDENT 5000 ENAMEL PROTECT 1.1-5 % GEL Place 1 application. onto teeth at bedtime.     Probiotic Product (UP4 PROBIOTICS ADULT PO) Take 1 capsule by mouth daily.     rosuvastatin (CRESTOR) 40 MG tablet Take 1 tablet (40 mg total) by mouth daily. 90 tablet 3   sodium bicarbonate 650 MG tablet Take 650 mg by mouth 3 (three) times daily.     torsemide (DEMADEX) 20 MG tablet TAKE 1 TABLET BY MOUTH DAILY 30  tablet 6   No current facility-administered medications for this visit.    SURGICAL HISTORY:  Past Surgical History:  Procedure Laterality Date   BIV ICD INSERTION CRT-D N/A 12/03/2021   Procedure: BIV ICD INSERTION CRT-D;  Surgeon: Lanier Prude, MD;  Location: [facility] INVASIVE CV LAB;  Service: Cardiovascular;  Laterality: N/A;   RIGHT/LEFT HEART CATH AND CORONARY ANGIOGRAPHY N/A 02/16/2021   Procedure: RIGHT/LEFT HEART CATH AND CORONARY ANGIOGRAPHY;  Surgeon: Dolores Patty, MD;  Location: [facility] INVASIVE CV LAB;  Service: Cardiovascular;  Laterality: N/A;   ROBOT ASSISTED LAPAROSCOPIC NEPHRECTOMY Right 06/23/2021   Procedure: XI ROBOTIC ASSISTED LAPAROSCOPIC NEPHRECTOMY;  Surgeon: Rene Paci, MD;  Location: [facility]S;  Service: Urology;  Laterality: Right;    REVIEW OF SYSTEMS:   Review of Systems  Constitutional: Negative for appetite change, chills, fatigue, fever and unexpected weight change.  HENT: Positive for dry mouth and taste alterations. Negative for mouth sores, nosebleeds, sore throat and trouble swallowing.  Eyes: Negative for eye problems and icterus.  Respiratory: Negative for cough, hemoptysis, shortness of breath and wheezing.   Cardiovascular: Negative for chest pain and leg swelling.  Gastrointestinal: Negative for abdominal pain, constipation, diarrhea, nausea and vomiting.  Genitourinary: Negative for bladder incontinence, difficulty urinating, dysuria, frequency and hematuria.   Musculoskeletal: Negative for back pain, gait problem, neck pain and neck stiffness.  Skin: Positive for itching between his fingers occasionally. Negative for rash.  Neurological: Negative for dizziness, extremity weakness, gait problem, headaches, light-headedness and seizures.  Hematological: Negative for adenopathy. Does not bruise/bleed easily.  Psychiatric/Behavioral: Negative for confusion, depression and sleep disturbance. The patient is not nervous/anxious.      PHYSICAL EXAMINATION:  There were no vitals taken for this visit.  ECOG PERFORMANCE STATUS: 1  Physical Exam  Constitutional: Oriented to person, place, and time and well-developed, well-nourished, and in no distress.  HENT:  Head: Normocephalic and atraumatic.  Mouth/Throat: Oropharynx is dry appearing. No oropharyngeal exudate.  Eyes: Conjunctivae are normal. Right eye exhibits no discharge. Left eye exhibits no discharge. No scleral icterus.  Neck: Normal range of motion. Neck supple.  Cardiovascular: Normal rate, regular rhythm, normal heart sounds and intact distal pulses.   Pulmonary/Chest: Effort normal and breath sounds normal. No respiratory distress. No wheezes. No rales.  Abdominal: Soft. Bowel sounds are normal. Exhibits no distension and no mass. There is no tenderness.  Musculoskeletal: Normal range of motion. Exhibits no edema.  Lymphadenopathy:    No cervical adenopathy.  Neurological: Alert and oriented to person, place, and time. Exhibits normal muscle tone. Gait normal. Coordination normal.  Skin: Skin is warm and dry. No rash noted. Not diaphoretic. No erythema. No pallor.  Psychiatric: Mood, memory and judgment normal.  Vitals reviewed.  LABORATORY DATA: Lab Results  Component Value Date   WBC 5.3 07/13/2022   HGB 11.7 (L) 07/13/2022   HCT 33.3 (L) 07/13/2022  MCV 81.8 07/13/2022   PLT 124 (L) 07/13/2022      Chemistry      Component Value Date/Time   NA 135 07/13/2022 1450   NA 137 05/10/2022 0000   K 4.1 07/13/2022 1450   CL 103 07/13/2022 1450   CO2 25 07/13/2022 1450   BUN 67 (H) 07/13/2022 1450   BUN 66 (A) 05/10/2022 0000   CREATININE 3.28 (H) 07/13/2022 1450   GLU 94 05/10/2022 0000      Component Value Date/Time   CALCIUM 10.1 07/13/2022 1450   ALKPHOS 35 (L) 07/13/2022 1450   AST 34 07/13/2022 1450   ALT 14 07/13/2022 1450   BILITOT 0.6 07/13/2022 1450       RADIOGRAPHIC STUDIES:  No results found.   ASSESSMENT/PLAN:   This is a very pleasant 72 year old African American male diagnosed with right sided renal cell carcinoma in June 2023.  He was found to have a T3a clear-cell without any evidence of metastatic disease.   He underwent robotic assisted laparoscopic right radical nephrectomy on 06/23/21.   He is currently on Keytruda 200 mg IV every 3 weeks.  He is tolerating this well.  He is status post 16 cycles.  The plan is to complete 1 year of treatment which will be 17 cycles.   Labs were reviewed.  Recommend that he proceed with cycle #17 today as schedule.  The patient has CKD.  Okay to treat with a creatinine of 3.17 today, which is near his baseline. He was previously referred to nephrology and follows every 6 months.  He will continue taking Synthroid for his hypothyroidism.I have added TSH and T4 to his next lab draw so we can check to see if dose adjustment is needed of his synthroid.  I will arrange for a restaging CT scan prior to his next appointment. I will order wo contrast due to his CKD.    We will see him back for follow-up visit in 3 weeks for evaluation and to review his scan. We will determine the next steps in his care based on the scan results.   He will continue to try to hydrate for the dry mouth.   He will use hydrocortisone for the itching.   The patient was advised to call immediately if he has any concerning symptoms in the interval. The patient voices understanding of current disease status and treatment options and is in agreement with the current care plan. All questions were answered. The patient knows to call the clinic with any problems, questions or concerns. We can certainly see the patient much sooner if necessary    No orders of the defined types were placed in this encounter.    The total time spent in the appointment was 20-29 minutes  Maresha Anastos L Sabeen Piechocki, PA-C 07/30/22

## 2022-08-04 ENCOUNTER — Ambulatory Visit: Payer: Medicare PPO | Admitting: Internal Medicine

## 2022-08-04 ENCOUNTER — Ambulatory Visit: Payer: Medicare PPO

## 2022-08-04 ENCOUNTER — Inpatient Hospital Stay: Payer: Medicare PPO | Admitting: Physician Assistant

## 2022-08-04 ENCOUNTER — Inpatient Hospital Stay: Payer: Medicare PPO

## 2022-08-04 ENCOUNTER — Other Ambulatory Visit: Payer: Medicare PPO

## 2022-08-04 ENCOUNTER — Other Ambulatory Visit: Payer: Self-pay

## 2022-08-04 DIAGNOSIS — C641 Malignant neoplasm of right kidney, except renal pelvis: Secondary | ICD-10-CM

## 2022-08-04 DIAGNOSIS — Z7982 Long term (current) use of aspirin: Secondary | ICD-10-CM | POA: Diagnosis not present

## 2022-08-04 DIAGNOSIS — Z5112 Encounter for antineoplastic immunotherapy: Secondary | ICD-10-CM | POA: Diagnosis not present

## 2022-08-04 DIAGNOSIS — E039 Hypothyroidism, unspecified: Secondary | ICD-10-CM | POA: Diagnosis not present

## 2022-08-04 DIAGNOSIS — Z79899 Other long term (current) drug therapy: Secondary | ICD-10-CM | POA: Diagnosis not present

## 2022-08-04 DIAGNOSIS — N189 Chronic kidney disease, unspecified: Secondary | ICD-10-CM | POA: Diagnosis not present

## 2022-08-04 DIAGNOSIS — R682 Dry mouth, unspecified: Secondary | ICD-10-CM | POA: Diagnosis not present

## 2022-08-04 DIAGNOSIS — Z905 Acquired absence of kidney: Secondary | ICD-10-CM | POA: Diagnosis not present

## 2022-08-04 LAB — CBC WITH DIFFERENTIAL (CANCER CENTER ONLY)
Abs Immature Granulocytes: 0.02 10*3/uL (ref 0.00–0.07)
Basophils Absolute: 0.1 10*3/uL (ref 0.0–0.1)
Basophils Relative: 1 %
Eosinophils Absolute: 0.3 10*3/uL (ref 0.0–0.5)
Eosinophils Relative: 6 %
HCT: 35.8 % — ABNORMAL LOW (ref 39.0–52.0)
Hemoglobin: 12.7 g/dL — ABNORMAL LOW (ref 13.0–17.0)
Immature Granulocytes: 0 %
Lymphocytes Relative: 18 %
Lymphs Abs: 0.9 10*3/uL (ref 0.7–4.0)
MCH: 28.6 pg (ref 26.0–34.0)
MCHC: 35.5 g/dL (ref 30.0–36.0)
MCV: 80.6 fL (ref 80.0–100.0)
Monocytes Absolute: 0.3 10*3/uL (ref 0.1–1.0)
Monocytes Relative: 7 %
Neutro Abs: 3.1 10*3/uL (ref 1.7–7.7)
Neutrophils Relative %: 68 %
Platelet Count: 135 10*3/uL — ABNORMAL LOW (ref 150–400)
RBC: 4.44 MIL/uL (ref 4.22–5.81)
RDW: 14.5 % (ref 11.5–15.5)
WBC Count: 4.6 10*3/uL (ref 4.0–10.5)
nRBC: 0 % (ref 0.0–0.2)

## 2022-08-04 LAB — CMP (CANCER CENTER ONLY)
ALT: 17 U/L (ref 0–44)
AST: 34 U/L (ref 15–41)
Albumin: 4.5 g/dL (ref 3.5–5.0)
Alkaline Phosphatase: 37 U/L — ABNORMAL LOW (ref 38–126)
Anion gap: 6 (ref 5–15)
BUN: 52 mg/dL — ABNORMAL HIGH (ref 8–23)
CO2: 26 mmol/L (ref 22–32)
Calcium: 10.4 mg/dL — ABNORMAL HIGH (ref 8.9–10.3)
Chloride: 103 mmol/L (ref 98–111)
Creatinine: 3.17 mg/dL — ABNORMAL HIGH (ref 0.61–1.24)
GFR, Estimated: 20 mL/min — ABNORMAL LOW (ref >60)
Glucose, Bld: 129 mg/dL — ABNORMAL HIGH (ref 70–99)
Potassium: 4.1 mmol/L (ref 3.5–5.1)
Sodium: 135 mmol/L (ref 135–145)
Total Bilirubin: 0.6 mg/dL (ref 0.3–1.2)
Total Protein: 7.9 g/dL (ref 6.5–8.1)

## 2022-08-04 LAB — TSH: TSH: 48.09 u[IU]/mL — ABNORMAL HIGH (ref 0.350–4.500)

## 2022-08-04 LAB — T4, FREE: Free T4: 0.65 ng/dL (ref 0.61–1.12)

## 2022-08-04 MED ORDER — SODIUM CHLORIDE 0.9 % IV SOLN
200.0000 mg | Freq: Once | INTRAVENOUS | Status: AC
  Administered 2022-08-04: 200 mg via INTRAVENOUS
  Filled 2022-08-04: qty 200

## 2022-08-04 MED ORDER — SODIUM CHLORIDE 0.9 % IV SOLN: Freq: Once | INTRAVENOUS | Status: AC

## 2022-08-04 NOTE — Progress Notes (Signed)
Ok to treat with elevated creatinine per Payette, PA.

## 2022-08-04 NOTE — Patient Instructions (Addendum)
Bryce Cain  Discharge Instructions: Thank you for choosing [facility] to provide your oncology and hematology care.   If you have a lab appointment with the [facility], please go directly to the [facility] and check in at the registration area.   Wear comfortable clothing and clothing appropriate for easy access to any Portacath or PICC line.   We strive to give you quality time with your provider. You may need to reschedule your appointment if you arrive late (15 or more minutes).  Arriving late affects you and other patients whose appointments are after yours.  Also, if you miss three or more appointments without notifying the office, you may be dismissed from the clinic at the provider's discretion.      For prescription refill requests, have your pharmacy contact our office and allow 72 hours for refills to be completed.    Today you received the following chemotherapy and/or immunotherapy agents: Keytruda      To help prevent nausea and vomiting after your treatment, we encourage you to take your nausea medication as directed.  BELOW ARE SYMPTOMS THAT SHOULD BE REPORTED IMMEDIATELY: *FEVER GREATER THAN 100.4 F (38 C) OR HIGHER *CHILLS OR SWEATING *NAUSEA AND VOMITING THAT IS NOT CONTROLLED WITH YOUR NAUSEA MEDICATION *UNUSUAL SHORTNESS OF BREATH *UNUSUAL BRUISING OR BLEEDING *URINARY PROBLEMS (pain or burning when urinating, or frequent urination) *BOWEL PROBLEMS (unusual diarrhea, constipation, pain near the anus) TENDERNESS IN MOUTH AND THROAT WITH OR WITHOUT PRESENCE OF ULCERS (sore throat, sores in mouth, or a toothache) UNUSUAL RASH, SWELLING OR PAIN  UNUSUAL VAGINAL DISCHARGE OR ITCHING   Items with * indicate a potential emergency and should be followed up as soon as possible or go to the Emergency Department if any problems should occur.  Please show the CHEMOTHERAPY ALERT CARD or IMMUNOTHERAPY ALERT CARD at  check-in to the Emergency Department and triage nurse.  Should you have questions after your visit or need to cancel or reschedule your appointment, please contact [facility]  Dept: [phone]  and follow the prompts.  Office hours are 8:00 a.m. to 4:30 p.m. Monday - Friday. Please note that voicemails left after 4:00 p.m. may not be returned until the following business day.  We are closed weekends and major holidays. You have access to a nurse at all times for urgent questions. Please call the main number to the clinic Dept: [phone] and follow the prompts.   For any non-urgent questions, you may also contact your provider using MyChart. We now offer e-Visits for anyone 14 and older to request care online for non-urgent symptoms. For details visit [email or web address].   Also download the MyChart app! Go to the app store, search "MyChart", open the app, select [facility], and log in with your MyChart username and password.

## 2022-08-23 ENCOUNTER — Encounter (HOSPITAL_COMMUNITY): Payer: Self-pay

## 2022-08-23 ENCOUNTER — Other Ambulatory Visit: Payer: Medicare PPO

## 2022-08-23 ENCOUNTER — Ambulatory Visit (HOSPITAL_COMMUNITY)
Admission: RE | Admit: 2022-08-23 | Discharge: 2022-08-23 | Disposition: A | Discharge status: Home / Self Care | Payer: Medicare PPO | Source: Ambulatory Visit | Attending: Physician Assistant | Admitting: Physician Assistant

## 2022-08-23 DIAGNOSIS — I700 Atherosclerosis of aorta: Secondary | ICD-10-CM | POA: Diagnosis not present

## 2022-08-23 DIAGNOSIS — Z905 Acquired absence of kidney: Secondary | ICD-10-CM | POA: Diagnosis not present

## 2022-08-23 DIAGNOSIS — R590 Localized enlarged lymph nodes: Secondary | ICD-10-CM | POA: Diagnosis not present

## 2022-08-23 DIAGNOSIS — C641 Malignant neoplasm of right kidney, except renal pelvis: Secondary | ICD-10-CM | POA: Insufficient documentation

## 2022-08-23 HISTORY — DX: Malignant (primary) neoplasm, unspecified: C80.1

## 2022-08-23 HISTORY — DX: Essential (primary) hypertension: I10

## 2022-08-26 ENCOUNTER — Ambulatory Visit: Payer: Medicare PPO | Admitting: Internal Medicine

## 2022-08-26 ENCOUNTER — Other Ambulatory Visit: Payer: Medicare PPO

## 2022-09-01 ENCOUNTER — Inpatient Hospital Stay: Payer: Medicare PPO | Attending: Internal Medicine | Admitting: Internal Medicine

## 2022-09-01 VITALS — BP 120/65 | HR 74 | Temp 98.2°F | Resp 17 | Ht 67.5 in | Wt 238.0 lb

## 2022-09-01 DIAGNOSIS — R040 Epistaxis: Secondary | ICD-10-CM | POA: Insufficient documentation

## 2022-09-01 DIAGNOSIS — Z905 Acquired absence of kidney: Secondary | ICD-10-CM | POA: Diagnosis not present

## 2022-09-01 DIAGNOSIS — Z85528 Personal history of other malignant neoplasm of kidney: Secondary | ICD-10-CM | POA: Insufficient documentation

## 2022-09-01 DIAGNOSIS — C641 Malignant neoplasm of right kidney, except renal pelvis: Secondary | ICD-10-CM | POA: Diagnosis not present

## 2022-09-01 DIAGNOSIS — Z9221 Personal history of antineoplastic chemotherapy: Secondary | ICD-10-CM | POA: Insufficient documentation

## 2022-09-01 NOTE — Progress Notes (Signed)
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      PRIOR THERAPY:  1) Robotic assisted laparoscopic right radical nephrectomy on June 23, 2021.  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    2) Pembrolizumab 200 mg every 3 weeks started on August 19, 2021.  Status post 17 cycles.  Last cycle was given on August 04, 2022.   CURRENT THERAPY: Observation.  INTERVAL HISTORY: Bryce Cain 72 y.o. male returns to the clinic today for follow-up visit.  The patient is feeling fine today with no concerning complaints except for some epistaxis from the left nostril having earlier today.  It did start when he blew his nose during the shower.  It is better now.  He denied having any current chest pain, shortness of breath, cough or hemoptysis.  He has no nausea, vomiting, diarrhea or constipation.  He has no headache or visual changes.  He denied having any recent weight loss or night sweats.  He completed a course of adjuvant immunotherapy.  He is here today for evaluation and repeat CT scan of the chest, abdomen and pelvis for restaging of his disease.   MEDICAL HISTORY: Past Medical History:  Diagnosis Date   CHF (congestive heart failure) (HCC)    DM (diabetes mellitus), type 2 (HCC)    Hypertension    OSA on CPAP    right renal ca     ALLERGIES:  has No Known Allergies.  MEDICATIONS:  Current Outpatient Medications  Medication Sig Dispense Refill   acetaminophen (TYLENOL) 325 MG tablet Take 1-2 tablets (325-650 mg total) by mouth every 4 (four) hours as needed for mild pain.     aspirin EC 81 MG tablet  Take 1 tablet (81 mg total) by mouth daily. Swallow whole. 90 tablet 3   empagliflozin (JARDIANCE) 10 MG TABS tablet Take 1 tablet (10 mg total) by mouth daily. 90 tablet 3   isosorbide-hydrALAZINE (BIDIL) 20-37.5 MG tablet TAKE 1 TABLET BY MOUTH THREE TIMES DAILY 90 tablet 6   levothyroxine (SYNTHROID) 50 MCG tablet TAKE 1 TABLET(50 MCG) BY MOUTH DAILY BEFORE BREAKFAST 90 tablet 1   nystatin (MYCOSTATIN) 100000 UNIT/ML suspension Take 5 mLs (500,000 Units total) by mouth 4 (four) times daily. (Patient not taking: Reported on 06/01/2022) 60 mL 0   Omega-3 Fatty Acids (FISH OIL) 1000 MG CAPS Take 1,000 mg by mouth daily.     Pembrolizumab (KEYTRUDA IV) Inject 1 Dose into the vein every 21 ( twenty-one) days.     potassium chloride (KLOR-CON M) 10 MEQ tablet TAKE 1 TABLET(10 MEQ) BY MOUTH DAILY 90 tablet 0   PREVIDENT 5000 ENAMEL PROTECT 1.1-5 % GEL Place 1 application. onto teeth at bedtime.     Probiotic Product (UP4 PROBIOTICS ADULT PO) Take 1 capsule by mouth daily.     rosuvastatin (CRESTOR) 40 MG tablet Take 1 tablet (40 mg total) by mouth daily. 90 tablet 3   sodium bicarbonate 650 MG tablet Take 650 mg by mouth 3 (three) times daily.     torsemide (DEMADEX) 20 MG tablet TAKE 1 TABLET BY MOUTH DAILY 30 tablet 6  No current facility-administered medications for this visit.    SURGICAL HISTORY:  Past Surgical History:  Procedure Laterality Date   BIV ICD INSERTION CRT-D N/A 12/03/2021   Procedure: BIV ICD INSERTION CRT-D;  Surgeon: Lanier Prude, MD;  Location: [facility] INVASIVE CV LAB;  Service: Cardiovascular;  Laterality: N/A;   RIGHT/LEFT HEART CATH AND CORONARY ANGIOGRAPHY N/A 02/16/2021   Procedure: RIGHT/LEFT HEART CATH AND CORONARY ANGIOGRAPHY;  Surgeon: Dolores Patty, MD;  Location: [facility] INVASIVE CV LAB;  Service: Cardiovascular;  Laterality: N/A;   ROBOT ASSISTED LAPAROSCOPIC NEPHRECTOMY Right 06/23/2021   Procedure: XI ROBOTIC ASSISTED LAPAROSCOPIC NEPHRECTOMY;  Surgeon:  Rene Paci, MD;  Location: [facility];  Service: Urology;  Laterality: Right;    REVIEW OF SYSTEMS:  Constitutional: negative Eyes: negative Ears, nose, mouth, throat, and face: positive for epistaxis Respiratory: negative Cardiovascular: negative Gastrointestinal: negative Genitourinary:negative Integument/breast: negative Hematologic/lymphatic: negative Musculoskeletal:negative Neurological: negative Behavioral/Psych: negative Endocrine: negative Allergic/Immunologic: negative   PHYSICAL EXAMINATION: General appearance: alert, cooperative, and no distress Head: Normocephalic, without obvious abnormality, atraumatic Neck: no adenopathy, no JVD, supple, symmetrical, trachea midline, and thyroid not enlarged, symmetric, no tenderness/mass/nodules Lymph nodes: Cervical, supraclavicular, and axillary nodes normal. Resp: clear to auscultation bilaterally Back: symmetric, no curvature. ROM normal. No CVA tenderness. Cardio: regular rate and rhythm, S1, S2 normal, no murmur, click, rub or gallop GI: soft, non-tender; bowel sounds normal; no masses,  no organomegaly Extremities: extremities normal, atraumatic, no cyanosis or edema Neurologic: Alert and oriented X 3, normal strength and tone. Normal symmetric reflexes. Normal coordination and gait  ECOG PERFORMANCE STATUS: 1 - Symptomatic but completely ambulatory  Blood pressure 120/65, pulse 74, temperature 98.2 F (36.8 C), temperature source Oral, resp. rate 17, height 5' 7.5" (1.715 m), weight 238 lb (108 kg), SpO2 99%.  LABORATORY DATA: Lab Results  Component Value Date   WBC 4.6 08/04/2022   HGB 12.7 (L) 08/04/2022   HCT 35.8 (L) 08/04/2022   MCV 80.6 08/04/2022   PLT 135 (L) 08/04/2022      Chemistry      Component Value Date/Time   NA 135 08/04/2022 1352   NA 137 05/10/2022 0000   K 4.1 08/04/2022 1352   CL 103 08/04/2022 1352   CO2 26 08/04/2022 1352   BUN 52 (H) 08/04/2022 1352   BUN 66 (A)  05/10/2022 0000   CREATININE 3.17 (H) 08/04/2022 1352   GLU 94 05/10/2022 0000      Component Value Date/Time   CALCIUM 10.4 (H) 08/04/2022 1352   ALKPHOS 37 (L) 08/04/2022 1352   AST 34 08/04/2022 1352   ALT 17 08/04/2022 1352   BILITOT 0.6 08/04/2022 1352       RADIOGRAPHIC STUDIES: CT CHEST ABDOMEN PELVIS WO CONTRAST  Result Date: 08/23/2022 CLINICAL DATA:  Right renal cancer status post right nephrectomy. Ongoing immunotherapy, most recent 1 week prior. Restaging. Dyspnea on exertion. Stabilized weight loss. * Tracking Code: BO * EXAM: CT CHEST, ABDOMEN AND PELVIS WITHOUT CONTRAST TECHNIQUE: Multidetector CT imaging of the chest, abdomen and pelvis was performed following the standard protocol without IV contrast. RADIATION DOSE REDUCTION: This exam was performed according to the departmental dose-optimization program which includes automated exposure control, adjustment of the mA and/or kV according to patient size and/or use of iterative reconstruction technique. COMPARISON:  04/27/2022 CT chest, abdomen and pelvis. FINDINGS: CT CHEST FINDINGS Cardiovascular: Normal heart size. No significant pericardial effusion/thickening. Stable 3 lead left subclavian ICD with lead tips in the right atrium, right ventricle and  coronary sinus. Atherosclerotic nonaneurysmal thoracic aorta. Normal caliber pulmonary arteries. Mediastinum/Nodes: No significant thyroid nodules. Unremarkable esophagus. No pathologically enlarged axillary, mediastinal or hilar lymph nodes, noting limited sensitivity for the detection of hilar adenopathy on this noncontrast study. Lungs/Pleura: No pneumothorax. No pleural effusion. No acute consolidative airspace disease or lung masses. Indistinct 0.4 cm anterior right upper lobe nodule (series 4/image 63), stable. Tiny 0.3 cm posterior left lower lobe nodule (series 4/image 90), stable. No new significant pulmonary nodules. Mild chronic bilateral diaphragmatic eventration is with  associated passive bibasilar atelectasis. Musculoskeletal: No aggressive appearing focal osseous lesions. Mild thoracic spondylosis. CT ABDOMEN PELVIS FINDINGS Hepatobiliary: Normal liver with no liver mass. Normal gallbladder with no radiopaque cholelithiasis. No biliary ductal dilatation. Pancreas: Normal, with no mass or duct dilation. Spleen: Normal size. No mass. Adrenals/Urinary Tract: Status post right adrenalectomy and right nephrectomy with no evidence of recurrent mass or fluid collection in right retroperitoneum. Stable normal left adrenal gland. No left hydronephrosis. No left renal stones. Several simple left renal cysts, largest an exophytic simple 4.2 cm posterior interpolar left renal cyst. Normal nondistended bladder. Stomach/Bowel: Normal non-distended stomach. Normal caliber small bowel with no small bowel wall thickening. Normal appendix. Minimal left colonic diverticulosis with no large bowel wall thickening or significant pericolonic fat stranding. Vascular/Lymphatic: Atherosclerotic nonaneurysmal abdominal aorta. No pathologically enlarged lymph nodes in the abdomen or pelvis. Reproductive: Mild prostatomegaly. Other: No pneumoperitoneum, ascites or focal fluid collection. Small fat containing umbilical hernia. Musculoskeletal: No aggressive appearing focal osseous lesions. Mild lower lumbar spondylosis. IMPRESSION: 1. Stable CT. No evidence of local tumor recurrence in the right nephrectomy bed. No evidence of metastatic disease in the chest, abdomen or pelvis on this noncontrast CT. 2. Mild prostatomegaly. 3.  Aortic Atherosclerosis (ICD10-I70.0). Electronically Signed   By: Delbert Phenix M.D.   On: 08/23/2022 16:35    ASSESSMENT AND PLAN: This is a very pleasant 72 years old African-American male with 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   He is a status post  robotic assisted laparoscopic right radical nephrectomy on June 23, 2021.  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 The patient underwent adjuvant treatment with immunotherapy with Keytruda 200 Mg IV every 3 weeks status post 17 cycles.  Last cycle was given August 04, 2022 The patient tolerated his previous adjuvant immunotherapy fairly well with no concerning adverse effects. He had repeat CT scan of the chest, abdomen and pelvis performed recently.  I personally and independently reviewed the scan and discussed the result with the patient today. His scan showed no concerning findings for disease recurrence or metastasis. I recommended for him to continue on observation with repeat CT scan of the chest, abdomen and pelvis without contrast in 6 months. For the epistaxis, he will monitor it closely and if recurrent, I recommended for him to seek attention by ENT.  He will apply saline nasal drops for now. The patient was advised to call immediately if he has any other concerning symptoms in the interval. The patient voices understanding of current disease status and treatment options and is in agreement with the current care plan.  All questions were answered. The patient knows to call the clinic with any problems, questions or concerns. We can certainly see the patient much sooner if necessary.  The total time  spent in the appointment was 30 minutes.  Disclaimer: This note was dictated with voice recognition software. Similar sounding words can inadvertently be transcribed and may not be corrected upon review.

## 2022-09-03 ENCOUNTER — Other Ambulatory Visit (HOSPITAL_COMMUNITY): Payer: Self-pay | Admitting: Adult Health

## 2022-09-03 ENCOUNTER — Ambulatory Visit (INDEPENDENT_AMBULATORY_CARE_PROVIDER_SITE_OTHER): Payer: Medicare PPO

## 2022-09-03 DIAGNOSIS — I447 Left bundle-branch block, unspecified: Secondary | ICD-10-CM

## 2022-09-03 DIAGNOSIS — I5022 Chronic systolic (congestive) heart failure: Secondary | ICD-10-CM

## 2022-09-03 LAB — CUP PACEART REMOTE DEVICE CHECK
Battery Remaining Longevity: 86 mo
Battery Remaining Percentage: 89 %
Battery Voltage: 3.01 V
Brady Statistic AP VP Percent: 5.7 %
Brady Statistic AP VS Percent: 1 %
Brady Statistic AS VP Percent: 93 %
Brady Statistic AS VS Percent: 1 %
Brady Statistic RA Percent Paced: 4.9 %
Date Time Interrogation Session: 20240830043637
HighPow Impedance: 64 Ohm
Implantable Lead Connection Status: 753985
Implantable Lead Connection Status: 753985
Implantable Lead Connection Status: 753985
Implantable Lead Implant Date: 20231130
Implantable Lead Implant Date: 20231130
Implantable Lead Implant Date: 20231130
Implantable Lead Location: 753858
Implantable Lead Location: 753859
Implantable Lead Location: 753860
Implantable Pulse Generator Implant Date: 20231130
Lead Channel Impedance Value: 460 Ohm
Lead Channel Impedance Value: 580 Ohm
Lead Channel Impedance Value: 690 Ohm
Lead Channel Pacing Threshold Amplitude: 0.375 V
Lead Channel Pacing Threshold Amplitude: 0.5 V
Lead Channel Pacing Threshold Amplitude: 1.5 V
Lead Channel Pacing Threshold Pulse Width: 0.5 ms
Lead Channel Pacing Threshold Pulse Width: 0.5 ms
Lead Channel Pacing Threshold Pulse Width: 0.8 ms
Lead Channel Sensing Intrinsic Amplitude: 12 mV
Lead Channel Sensing Intrinsic Amplitude: 5 mV
Lead Channel Setting Pacing Amplitude: 1.375
Lead Channel Setting Pacing Amplitude: 1.5 V
Lead Channel Setting Pacing Amplitude: 2 V
Lead Channel Setting Pacing Pulse Width: 0.5 ms
Lead Channel Setting Pacing Pulse Width: 0.8 ms
Lead Channel Setting Sensing Sensitivity: 0.5 mV
Pulse Gen Serial Number: 211010483
Zone Setting Status: 755011

## 2022-09-07 NOTE — Progress Notes (Signed)
Remote ICD transmission.   

## 2022-09-29 ENCOUNTER — Other Ambulatory Visit (HOSPITAL_COMMUNITY): Payer: Self-pay | Admitting: Family Medicine

## 2022-09-30 ENCOUNTER — Other Ambulatory Visit (HOSPITAL_COMMUNITY): Payer: Self-pay

## 2022-09-30 MED ORDER — POTASSIUM CHLORIDE CRYS ER 10 MEQ PO TBCR: 10.0000 meq | EXTENDED_RELEASE_TABLET | Freq: Every day | ORAL | 1 refills | Status: DC

## 2022-10-08 ENCOUNTER — Other Ambulatory Visit (HOSPITAL_COMMUNITY): Payer: Self-pay | Admitting: Adult Health

## 2022-10-28 DIAGNOSIS — C641 Malignant neoplasm of right kidney, except renal pelvis: Secondary | ICD-10-CM | POA: Diagnosis not present

## 2022-10-28 DIAGNOSIS — Z23 Encounter for immunization: Secondary | ICD-10-CM | POA: Diagnosis not present

## 2022-10-28 DIAGNOSIS — I5022 Chronic systolic (congestive) heart failure: Secondary | ICD-10-CM | POA: Diagnosis not present

## 2022-10-28 DIAGNOSIS — I130 Hypertensive heart and chronic kidney disease with heart failure and stage 1 through stage 4 chronic kidney disease, or unspecified chronic kidney disease: Secondary | ICD-10-CM | POA: Diagnosis not present

## 2022-10-28 DIAGNOSIS — E1122 Type 2 diabetes mellitus with diabetic chronic kidney disease: Secondary | ICD-10-CM | POA: Diagnosis not present

## 2022-10-28 DIAGNOSIS — N184 Chronic kidney disease, stage 4 (severe): Secondary | ICD-10-CM | POA: Diagnosis not present

## 2022-10-28 DIAGNOSIS — D631 Anemia in chronic kidney disease: Secondary | ICD-10-CM | POA: Diagnosis not present

## 2022-10-28 DIAGNOSIS — E872 Acidosis, unspecified: Secondary | ICD-10-CM | POA: Diagnosis not present

## 2022-10-28 DIAGNOSIS — N189 Chronic kidney disease, unspecified: Secondary | ICD-10-CM | POA: Diagnosis not present

## 2022-10-28 LAB — BASIC METABOLIC PANEL
BUN: 60 — AB (ref 4–21)
CO2: 24 — AB (ref 13–22)
Chloride: 101 (ref 99–108)
Creatinine: 3.2 — AB (ref 0.6–1.3)
Glucose: 94
Potassium: 4.8 meq/L (ref 3.5–5.1)
Sodium: 136 — AB (ref 137–147)

## 2022-10-28 LAB — CBC AND DIFFERENTIAL
HCT: 36 — AB (ref 41–53)
Hemoglobin: 12.1 — AB (ref 13.5–17.5)
Platelets: 165 10*3/uL (ref 150–400)
WBC: 6

## 2022-10-28 LAB — COMPREHENSIVE METABOLIC PANEL
Albumin: 4.5 (ref 3.5–5.0)
Calcium: 10.3 (ref 8.7–10.7)
eGFR: 19

## 2022-10-28 LAB — VITAMIN D 25 HYDROXY (VIT D DEFICIENCY, FRACTURES): Vit D, 25-Hydroxy: 35

## 2022-10-28 LAB — CBC: RBC: 4.29 (ref 3.87–5.11)

## 2022-11-11 ENCOUNTER — Encounter: Payer: Self-pay | Admitting: Internal Medicine

## 2022-12-01 ENCOUNTER — Other Ambulatory Visit: Payer: Self-pay | Admitting: Internal Medicine

## 2022-12-03 ENCOUNTER — Ambulatory Visit (INDEPENDENT_AMBULATORY_CARE_PROVIDER_SITE_OTHER): Payer: Medicare PPO

## 2022-12-03 DIAGNOSIS — I447 Left bundle-branch block, unspecified: Secondary | ICD-10-CM | POA: Diagnosis not present

## 2022-12-03 LAB — CUP PACEART REMOTE DEVICE CHECK
Battery Remaining Longevity: 84 mo
Battery Remaining Percentage: 87 %
Battery Voltage: 3.01 V
Brady Statistic AP VP Percent: 6.6 %
Brady Statistic AP VS Percent: 1 %
Brady Statistic AS VP Percent: 92 %
Brady Statistic AS VS Percent: 1 %
Brady Statistic RA Percent Paced: 6 %
Date Time Interrogation Session: 20241129011000
HighPow Impedance: 63 Ohm
Implantable Lead Connection Status: 753985
Implantable Lead Connection Status: 753985
Implantable Lead Connection Status: 753985
Implantable Lead Implant Date: 20231130
Implantable Lead Implant Date: 20231130
Implantable Lead Implant Date: 20231130
Implantable Lead Location: 753858
Implantable Lead Location: 753859
Implantable Lead Location: 753860
Implantable Pulse Generator Implant Date: 20231130
Lead Channel Impedance Value: 440 Ohm
Lead Channel Impedance Value: 560 Ohm
Lead Channel Impedance Value: 700 Ohm
Lead Channel Pacing Threshold Amplitude: 0.375 V
Lead Channel Pacing Threshold Amplitude: 0.5 V
Lead Channel Pacing Threshold Amplitude: 1.625 V
Lead Channel Pacing Threshold Pulse Width: 0.5 ms
Lead Channel Pacing Threshold Pulse Width: 0.5 ms
Lead Channel Pacing Threshold Pulse Width: 0.8 ms
Lead Channel Sensing Intrinsic Amplitude: 12 mV
Lead Channel Sensing Intrinsic Amplitude: 5 mV
Lead Channel Setting Pacing Amplitude: 1.375
Lead Channel Setting Pacing Amplitude: 1.5 V
Lead Channel Setting Pacing Amplitude: 2.125
Lead Channel Setting Pacing Pulse Width: 0.5 ms
Lead Channel Setting Pacing Pulse Width: 0.8 ms
Lead Channel Setting Sensing Sensitivity: 0.5 mV
Pulse Gen Serial Number: 211010483
Zone Setting Status: 755011

## 2022-12-21 NOTE — Progress Notes (Signed)
Remote ICD transmission.   

## 2023-01-25 ENCOUNTER — Ambulatory Visit: Payer: Medicare PPO | Admitting: Student

## 2023-02-04 ENCOUNTER — Encounter: Payer: Self-pay | Admitting: Physician Assistant

## 2023-02-04 ENCOUNTER — Ambulatory Visit: Payer: Medicare PPO | Attending: Physician Assistant | Admitting: Physician Assistant

## 2023-02-04 VITALS — BP 112/60 | HR 88 | Ht 67.75 in | Wt 242.0 lb

## 2023-02-04 DIAGNOSIS — I5022 Chronic systolic (congestive) heart failure: Secondary | ICD-10-CM

## 2023-02-04 DIAGNOSIS — I428 Other cardiomyopathies: Secondary | ICD-10-CM

## 2023-02-04 DIAGNOSIS — Z95810 Presence of automatic (implantable) cardiac defibrillator: Secondary | ICD-10-CM

## 2023-02-04 NOTE — Patient Instructions (Signed)
Medication Instructions:   Your physician recommends that you continue on your current medications as directed. Please refer to the Current Medication list given to you today.  *If you need a refill on your cardiac medications before your next appointment, please call your pharmacy*   Lab Work:  NONE ORDERED  TODAY   If you have labs (blood work) drawn today and your tests are completely normal, you will receive your results only by: MyChart Message (if you have MyChart) OR A paper copy in the mail If you have any lab test that is abnormal or we need to change your treatment, we will call you to review the results.   Testing/Procedures:  NONE ORDERED  TODAY    Follow-Up: At [facility], you and your health needs are our priority.  As part of our continuing mission to provide you with exceptional heart care, we have created designated Provider Care Teams.  These Care Teams include your primary Cardiologist (physician) and Advanced Practice Providers (APPs -  Physician Assistants and Nurse Practitioners) who all work together to provide you with the care you need, when you need it.  We recommend signing up for the patient portal called "MyChart".  Sign up information is provided on this After Visit Summary.  MyChart is used to connect with patients for Virtual Visits (Telemedicine).  Patients are able to view lab/test results, encounter notes, upcoming appointments, etc.  Non-urgent messages can be sent to your provider as well.   To learn more about what you can do with MyChart, go to [email or web address].    Your next appointment:   1 year(s)   Provider:   You may see Lanier Prude, MD   Other Instructions

## 2023-02-04 NOTE — Progress Notes (Signed)
Cardiology Office Note:  .   Date:  02/04/2023  ID:  Bryce Cain, DOB 1950-02-14, MRN [id number] PCP: Philip Aspen, [name], MD  [facility] Providers Cardiologist:  None Electrophysiologist:  Lanier Prude, MD {  History of Present Illness: .   Bryce Cain is a 73 y.o. male w/PMHx of  OSA w/CPAP, DM, HTN, HLD, LBBB, NICM, chronic CHF, ICD Kidney cancer (s/p righ nephrectomy 06/2021) CKD IV  Saw Dr. Lalla Brothers 03/09/22, reported ongoing "tremors". These are sensations in his substernal chest; he motions in a jerking, rhythmic pattern over his sternum. Usually this occurs in the evenings, or as late as 1-2 AM. Episodes of these tremors last 8-12 seconds, which he states includes 4-5 heartbeats  Device function intact, no changes were made  Saw Dr. Gala Romney 05/13/2022, doing well, BP 98%. No VT, volume OK "Symptomatically stable despite no improvement in severely depressed EF with CRT. Will continue to follow closely. Given age and CKD IV options are very limited as not candidate for advanced therapies"  Today's visit is scheduled as a 6 mo visit  ROS:   When asked about symptom, CP He mentions that same brief rhythmic beating he feels on occasion, this goes back all the way to the time of his device implant He has learned to live with it, not bothersome any more  No CP/symptom otherwise No SOB, DOE Denies weight gain, edema, bloating No near syncope or syncope.  Follows with [facility]/nephrologist  Device information Abbott CRT-D implanted 12/03/2021  Arrhythmia/AAD hx None to date  Studies Reviewed: Marland Kitchen    EKG not done today  DEVICE interrogation done today and reviewed by myself Battery and lead measurements are good No arrhythmias BP99% RV autocapture/cap confirm replicates his symptom Discussed turning off autotest in the RV lead, though he preferred not to make any changes    Echo 04/13/22 EF < 20% RV ok   Feb 2023: cMRI LVEF 16%, RVEF 52%,  LGE basal septal midwall (scar pattern seen with NICM). Cardiomyopathy felt to be likely due to LBBB +/- frequent PVCs    02/16/21: R/LHC   Prox RCA lesion is 30% stenosed.   1st Diag lesion is 20% stenosed.   Prox Cx to Mid Cx lesion is 20% stenosed.   Findings:   Ao = 102/70 (85) LV = 104/24 RA =  10 RV = 47/17 PA = 52/22 (33) PCW = 25 Fick cardiac output/index = 5.5/2.5 PVR =1.5 WU Ao sat = 98% PA sat = 65%, 66%   Assessment: 1. 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 Elevated filling pressures with normal cardiac output   02/15/21: TTE  1. Left ventricular ejection fraction, by estimation, is 20 to 25%. The  left ventricle has severely decreased function. The left ventricle  demonstrates regional wall motion abnormalities (see scoring  diagram/findings for description). There is mild  concentric left ventricular hypertrophy. Left ventricular diastolic  function could not be evaluated. There is akinesis of the left  ventricular, mid inferoseptal wall and anteroseptal wall. There is  akinesis of the left ventricular, apical septal wall,  inferior wall and inferolateral wall. There is akinesis of the left  ventricular, entire apical segment and anterior wall.   2. Right ventricular systolic function is normal. The right ventricular  size is normal. Tricuspid regurgitation signal is inadequate for assessing  PA pressure.   3. Left atrial size was severely dilated.  4. The mitral valve is degenerative. Mild to moderate mitral valve  regurgitation. No evidence of mitral stenosis.   5. The aortic valve is tricuspid. Aortic valve regurgitation is mild.  Aortic valve sclerosis/calcification is present, without any evidence of  aortic stenosis.   6. Pulmonic valve regurgitation is moderate.   7. Aortic dilatation noted. There is mild dilatation of the aortic root,  measuring 38 mm.   8. The inferior vena cava is  dilated in size with >50% respiratory  variability, suggesting right atrial pressure of 8 mmHg.    Risk Assessment/Calculations:    Physical Exam:   VS:  There were no vitals taken for this visit.   Wt Readings from Last 3 Encounters:  09/01/22 238 lb (108 kg)  08/04/22 240 lb 6.4 oz (109 kg)  07/13/22 239 lb 1.6 oz (108.5 kg)    GEN: Well nourished, well developed in no acute distress NECK: No JVD; No carotid bruits CARDIAC: RRR, no murmurs, rubs, gallops RESPIRATORY:  CTA b/l without rales, wheezing or rhonchi  ABDOMEN: Soft, non-tender, non-distended EXTREMITIES:  No edema; No deformity   ICD site: is stable, no thinning, fluctuation, tethering  ASSESSMENT AND PLAN: .    CRT-D intact function no programming changes made  NICM Chronic CHF  99 % BP CorVue stable, wobbles about baseline  overdue to see AHF team, offered to have our staff assist with an appointment, he declined     Dispo: back with EP in a year again, sooner if needed  Signed, Sheilah Pigeon, PA-C

## 2023-02-23 ENCOUNTER — Other Ambulatory Visit: Payer: Self-pay | Admitting: Internal Medicine

## 2023-02-23 ENCOUNTER — Inpatient Hospital Stay: Payer: Medicare PPO | Attending: Internal Medicine

## 2023-02-23 ENCOUNTER — Ambulatory Visit (HOSPITAL_COMMUNITY)
Admission: RE | Admit: 2023-02-23 | Discharge: 2023-02-23 | Disposition: A | Discharge status: Home / Self Care | Payer: Medicare PPO | Source: Ambulatory Visit | Attending: Internal Medicine | Admitting: Internal Medicine

## 2023-02-23 DIAGNOSIS — C641 Malignant neoplasm of right kidney, except renal pelvis: Secondary | ICD-10-CM | POA: Diagnosis not present

## 2023-02-23 DIAGNOSIS — Z79899 Other long term (current) drug therapy: Secondary | ICD-10-CM | POA: Insufficient documentation

## 2023-02-23 DIAGNOSIS — Z905 Acquired absence of kidney: Secondary | ICD-10-CM | POA: Diagnosis not present

## 2023-02-23 DIAGNOSIS — I700 Atherosclerosis of aorta: Secondary | ICD-10-CM | POA: Diagnosis not present

## 2023-02-23 DIAGNOSIS — N1330 Unspecified hydronephrosis: Secondary | ICD-10-CM | POA: Diagnosis not present

## 2023-02-23 LAB — CBC WITH DIFFERENTIAL (CANCER CENTER ONLY)
Abs Immature Granulocytes: 0.01 10*3/uL (ref 0.00–0.07)
Basophils Absolute: 0.1 10*3/uL (ref 0.0–0.1)
Basophils Relative: 1 %
Eosinophils Absolute: 0.3 10*3/uL (ref 0.0–0.5)
Eosinophils Relative: 6 %
HCT: 34.2 % — ABNORMAL LOW (ref 39.0–52.0)
Hemoglobin: 11.8 g/dL — ABNORMAL LOW (ref 13.0–17.0)
Immature Granulocytes: 0 %
Lymphocytes Relative: 18 %
Lymphs Abs: 1 10*3/uL (ref 0.7–4.0)
MCH: 27.6 pg (ref 26.0–34.0)
MCHC: 34.5 g/dL (ref 30.0–36.0)
MCV: 79.9 fL — ABNORMAL LOW (ref 80.0–100.0)
Monocytes Absolute: 0.7 10*3/uL (ref 0.1–1.0)
Monocytes Relative: 13 %
Neutro Abs: 3.7 10*3/uL (ref 1.7–7.7)
Neutrophils Relative %: 62 %
Platelet Count: 152 10*3/uL (ref 150–400)
RBC: 4.28 MIL/uL (ref 4.22–5.81)
RDW: 15.5 % (ref 11.5–15.5)
WBC Count: 5.8 10*3/uL (ref 4.0–10.5)
nRBC: 0 % (ref 0.0–0.2)

## 2023-02-23 LAB — CMP (CANCER CENTER ONLY)
ALT: 14 U/L (ref 0–44)
AST: 25 U/L (ref 15–41)
Albumin: 4.1 g/dL (ref 3.5–5.0)
Alkaline Phosphatase: 44 U/L (ref 38–126)
Anion gap: 6 (ref 5–15)
BUN: 42 mg/dL — ABNORMAL HIGH (ref 8–23)
CO2: 26 mmol/L (ref 22–32)
Calcium: 9.7 mg/dL (ref 8.9–10.3)
Chloride: 104 mmol/L (ref 98–111)
Creatinine: 2.75 mg/dL — ABNORMAL HIGH (ref 0.61–1.24)
GFR, Estimated: 23 mL/min — ABNORMAL LOW (ref >60)
Glucose, Bld: 100 mg/dL — ABNORMAL HIGH (ref 70–99)
Potassium: 4 mmol/L (ref 3.5–5.1)
Sodium: 136 mmol/L (ref 135–145)
Total Bilirubin: 0.6 mg/dL (ref 0.0–1.2)
Total Protein: 6.8 g/dL (ref 6.5–8.1)

## 2023-03-03 ENCOUNTER — Inpatient Hospital Stay: Payer: Medicare PPO | Admitting: Internal Medicine

## 2023-03-03 VITALS — BP 105/58 | HR 93 | Temp 97.4°F | Resp 17 | Ht 67.5 in | Wt 242.0 lb

## 2023-03-03 DIAGNOSIS — C641 Malignant neoplasm of right kidney, except renal pelvis: Secondary | ICD-10-CM | POA: Diagnosis not present

## 2023-03-03 DIAGNOSIS — Z79899 Other long term (current) drug therapy: Secondary | ICD-10-CM | POA: Diagnosis not present

## 2023-03-03 NOTE — Progress Notes (Unsigned)
 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      PRIOR THERAPY:  1) Robotic assisted laparoscopic right radical nephrectomy on June 23, 2021.  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    2) Pembrolizumab 200 mg every 3 weeks started on August 19, 2021.  Status post 17 cycles.  Last cycle was given on August 04, 2022.   CURRENT THERAPY: Observation.  INTERVAL HISTORY: Bryce Cain 73 y.o. male returns to the clinic today for follow-up visit.  The patient is feeling fine today with no concerning complaints except for some epistaxis from the left nostril having earlier today.  It did start when he blew his nose during the shower.  It is better now.  He denied having any current chest pain, shortness of breath, cough or hemoptysis.  He has no nausea, vomiting, diarrhea or constipation.  He has no headache or visual changes.  He denied having any recent weight loss or night sweats.  He completed a course of adjuvant immunotherapy.  He is here today for evaluation and repeat CT scan of the chest, abdomen and pelvis for restaging of his disease.   MEDICAL HISTORY: Past Medical History:  Diagnosis Date   CHF (congestive heart failure) (HCC)    DM (diabetes mellitus), type 2 (HCC)    Hypertension    OSA on CPAP    right renal ca     ALLERGIES:  has no known allergies.  MEDICATIONS:  Current Outpatient Medications  Medication Sig Dispense Refill   acetaminophen (TYLENOL) 325 MG tablet Take 1-2 tablets (325-650 mg total) by mouth every 4 (four) hours as needed for mild pain.     aspirin EC 81 MG tablet  Take 1 tablet (81 mg total) by mouth daily. Swallow whole. 90 tablet 3   empagliflozin (JARDIANCE) 10 MG TABS tablet Take 1 tablet (10 mg total) by mouth daily. (Patient not taking: Reported on 02/04/2023) 90 tablet 3   isosorbide-hydrALAZINE (BIDIL) 20-37.5 MG tablet TAKE 1 TABLET BY MOUTH THREE TIMES DAILY 90 tablet 6   levothyroxine (SYNTHROID) 50 MCG tablet TAKE 1 TABLET(50 MCG) BY MOUTH DAILY BEFORE BREAKFAST 90 tablet 1   nystatin (MYCOSTATIN) 100000 UNIT/ML suspension Take 5 mLs (500,000 Units total) by mouth 4 (four) times daily. (Patient not taking: Reported on 02/04/2023) 60 mL 0   Omega-3 Fatty Acids (FISH OIL) 1000 MG CAPS Take 1,000 mg by mouth daily.     Pembrolizumab (KEYTRUDA IV) Inject 1 Dose into the vein every 21 ( twenty-one) days. (Patient not taking: Reported on 02/04/2023)     potassium chloride (KLOR-CON M) 10 MEQ tablet Take 1 tablet (10 mEq total) by mouth daily. 60 tablet 1   PREVIDENT 5000 ENAMEL PROTECT 1.1-5 % GEL Place 1 application. onto teeth at bedtime.     Probiotic Product (UP4 PROBIOTICS ADULT PO) Take 1 capsule by mouth daily.     rosuvastatin (CRESTOR) 40 MG tablet Take 1 tablet (40 mg total) by mouth daily. 90 tablet 3   sodium bicarbonate 650 MG tablet Take 650 mg by mouth 3 (three) times daily.  torsemide (DEMADEX) 20 MG tablet TAKE 1 TABLET BY MOUTH DAILY 30 tablet 6   No current facility-administered medications for this visit.    SURGICAL HISTORY:  Past Surgical History:  Procedure Laterality Date   BIV ICD INSERTION CRT-D N/A 12/03/2021   Procedure: BIV ICD INSERTION CRT-D;  Surgeon: Lanier Prude, MD;  Location: [facility] INVASIVE CV LAB;  Service: Cardiovascular;  Laterality: N/A;   RIGHT/LEFT HEART CATH AND CORONARY ANGIOGRAPHY N/A 02/16/2021   Procedure: RIGHT/LEFT HEART CATH AND CORONARY ANGIOGRAPHY;  Surgeon: Dolores Patty, MD;  Location: [facility] INVASIVE CV LAB;  Service: Cardiovascular;  Laterality: N/A;   ROBOT ASSISTED LAPAROSCOPIC  NEPHRECTOMY Right 06/23/2021   Procedure: XI ROBOTIC ASSISTED LAPAROSCOPIC NEPHRECTOMY;  Surgeon: Rene Paci, MD;  Location: [facility] ORS;  Service: Urology;  Laterality: Right;    REVIEW OF SYSTEMS:  Constitutional: negative Eyes: negative Ears, nose, mouth, throat, and face: positive for epistaxis Respiratory: negative Cardiovascular: negative Gastrointestinal: negative Genitourinary:negative Integument/breast: negative Hematologic/lymphatic: negative Musculoskeletal:negative Neurological: negative Behavioral/Psych: negative Endocrine: negative Allergic/Immunologic: negative   PHYSICAL EXAMINATION: General appearance: alert, cooperative, and no distress Head: Normocephalic, without obvious abnormality, atraumatic Neck: no adenopathy, no JVD, supple, symmetrical, trachea midline, and thyroid not enlarged, symmetric, no tenderness/mass/nodules Lymph nodes: Cervical, supraclavicular, and axillary nodes normal. Resp: clear to auscultation bilaterally Back: symmetric, no curvature. ROM normal. No CVA tenderness. Cardio: regular rate and rhythm, S1, S2 normal, no murmur, click, rub or gallop GI: soft, non-tender; bowel sounds normal; no masses,  no organomegaly Extremities: extremities normal, atraumatic, no cyanosis or edema Neurologic: Alert and oriented X 3, normal strength and tone. Normal symmetric reflexes. Normal coordination and gait  ECOG PERFORMANCE STATUS: 1 - Symptomatic but completely ambulatory  There were no vitals taken for this visit.  LABORATORY DATA: Lab Results  Component Value Date   WBC 5.8 02/23/2023   HGB 11.8 (L) 02/23/2023   HCT 34.2 (L) 02/23/2023   MCV 79.9 (L) 02/23/2023   PLT 152 02/23/2023      Chemistry      Component Value Date/Time   NA 136 02/23/2023 1114   NA 136 (A) 10/28/2022 0000   K 4.0 02/23/2023 1114   CL 104 02/23/2023 1114   CO2 26 02/23/2023 1114   BUN 42 (H) 02/23/2023 1114   BUN 60 (A) 10/28/2022 0000    CREATININE 2.75 (H) 02/23/2023 1114   GLU 94 10/28/2022 0000      Component Value Date/Time   CALCIUM 9.7 02/23/2023 1114   ALKPHOS 44 02/23/2023 1114   AST 25 02/23/2023 1114   ALT 14 02/23/2023 1114   BILITOT 0.6 02/23/2023 1114       RADIOGRAPHIC STUDIES: CT CHEST ABDOMEN PELVIS WO CONTRAST Result Date: 02/24/2023 CLINICAL DATA:  Renal cell carcinoma, restaging. * Tracking Code: BO * EXAM: CT CHEST, ABDOMEN AND PELVIS WITHOUT CONTRAST TECHNIQUE: Multidetector CT imaging of the chest, abdomen and pelvis was performed following the standard protocol without IV contrast. RADIATION DOSE REDUCTION: This exam was performed according to the departmental dose-optimization program which includes automated exposure control, adjustment of the mA and/or kV according to patient size and/or use of iterative reconstruction technique. COMPARISON:  Multiple priors including most recent CT August 23, 2022 FINDINGS: CT CHEST FINDINGS Cardiovascular: Aortic atherosclerosis. Stable left chest cardiac generator and leads. Normal size heart. Mediastinum/Nodes: No pathologically enlarged mediastinal, hilar or axillary lymph nodes. The esophagus is grossly unremarkable. No suspicious thyroid nodule. Lungs/Pleura: Stable 4 mm anterior right upper lobe pulmonary nodule on image  52/6. Previously identified 3 mm left lower pulmonary nodule is not identified on today's examination. No new suspicious pulmonary nodules or masses. Scattered atelectasis/scarring. Musculoskeletal: No aggressive lytic or blastic lesion of bone. CT ABDOMEN PELVIS FINDINGS Hepatobiliary: No suspicious hepatic lesion on noncontrast enhanced examination. Gallbladder is unremarkable. No biliary ductal dilation. Pancreas: No pancreatic ductal dilation or evidence of acute inflammation. Spleen: No splenomegaly. 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 Prior right nephrectomy without new suspicious  nodularity in the surgical bed. Left adrenal gland appears normal. Stable fluid density left renal lesions compatible with cysts. Hydronephrosis. Urinary bladder is minimally distended. Stomach/Bowel: No radiopaque enteric contrast material was administered. Stomach is unremarkable for degree of distension. No pathologic dilation of small or large bowel. Normal appendix. No evidence of acute bowel inflammation. Vascular/Lymphatic: Aortic atherosclerosis. Retroaortic left renal vein. No pathologically enlarged abdominal or pelvic lymph nodes. Reproductive: Prostate is unremarkable. Other: Fat containing umbilical hernia. Musculoskeletal: No aggressive lytic or blastic lesion of bone. Multilevel degenerative change of the spine. IMPRESSION: 1. Prior right nephrectomy without evidence of local recurrence. 2. No evidence of metastatic disease within the chest, abdomen, or pelvis on this noncontrast enhanced examination. 3.  Aortic Atherosclerosis (ICD10-I70.0). Electronically Signed   By: Maudry Mayhew M.D.   On: 02/24/2023 15:17    ASSESSMENT AND PLAN: This is a very pleasant 73 years old African-American male with 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   He is a status post robotic assisted laparoscopic right radical nephrectomy on June 23, 2021.  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 The patient underwent adjuvant treatment with immunotherapy with Keytruda 200 Mg IV every 3 weeks status post 17 cycles.  Last cycle was given August 04, 2022 The patient tolerated his previous adjuvant immunotherapy fairly well with no concerning adverse effects. He had repeat CT scan of the chest, abdomen and pelvis performed recently.  I personally and independently reviewed the scan and discussed the result with the  patient today. His scan showed no concerning findings for disease recurrence or metastasis. I recommended for him to continue on observation with repeat CT scan of the chest, abdomen and pelvis without contrast in 6 months. For the epistaxis, he will monitor it closely and if recurrent, I recommended for him to seek attention by ENT.  He will apply saline nasal drops for now. The patient was advised to call immediately if he has any other concerning symptoms in the interval. The patient voices understanding of current disease status and treatment options and is in agreement with the current care plan.  All questions were answered. The patient knows to call the clinic with any problems, questions or concerns. We can certainly see the patient much sooner if necessary.  The total time spent in the appointment was 30 minutes.  Disclaimer: This note was dictated with voice recognition software. Similar sounding words can inadvertently be transcribed and may not be corrected upon review.

## 2023-03-04 ENCOUNTER — Ambulatory Visit (INDEPENDENT_AMBULATORY_CARE_PROVIDER_SITE_OTHER): Payer: Medicare PPO

## 2023-03-04 DIAGNOSIS — I428 Other cardiomyopathies: Secondary | ICD-10-CM

## 2023-03-09 ENCOUNTER — Encounter: Payer: Self-pay | Admitting: Cardiology

## 2023-03-09 LAB — CUP PACEART REMOTE DEVICE CHECK
Battery Remaining Longevity: 79 mo
Battery Remaining Percentage: 84 %
Battery Voltage: 2.99 V
Brady Statistic AP VP Percent: 2.5 %
Brady Statistic AP VS Percent: 1 %
Brady Statistic AS VP Percent: 97 %
Brady Statistic AS VS Percent: 1 %
Brady Statistic RA Percent Paced: 2.5 %
Date Time Interrogation Session: 20250304141505
HighPow Impedance: 65 Ohm
Implantable Lead Connection Status: 753985
Implantable Lead Connection Status: 753985
Implantable Lead Connection Status: 753985
Implantable Lead Implant Date: 20231130
Implantable Lead Implant Date: 20231130
Implantable Lead Implant Date: 20231130
Implantable Lead Location: 753858
Implantable Lead Location: 753859
Implantable Lead Location: 753860
Implantable Pulse Generator Implant Date: 20231130
Lead Channel Impedance Value: 430 Ohm
Lead Channel Impedance Value: 540 Ohm
Lead Channel Impedance Value: 700 Ohm
Lead Channel Pacing Threshold Amplitude: 0.375 V
Lead Channel Pacing Threshold Amplitude: 0.5 V
Lead Channel Pacing Threshold Amplitude: 1.75 V
Lead Channel Pacing Threshold Pulse Width: 0.5 ms
Lead Channel Pacing Threshold Pulse Width: 0.5 ms
Lead Channel Pacing Threshold Pulse Width: 0.8 ms
Lead Channel Sensing Intrinsic Amplitude: 12 mV
Lead Channel Sensing Intrinsic Amplitude: 5 mV
Lead Channel Setting Pacing Amplitude: 1.375
Lead Channel Setting Pacing Amplitude: 1.5 V
Lead Channel Setting Pacing Amplitude: 2.25 V
Lead Channel Setting Pacing Pulse Width: 0.5 ms
Lead Channel Setting Pacing Pulse Width: 0.8 ms
Lead Channel Setting Sensing Sensitivity: 0.5 mV
Pulse Gen Serial Number: 211010483
Zone Setting Status: 755011

## 2023-04-01 NOTE — Addendum Note (Signed)
 Addended by: Elease Etienne A on: 04/01/2023 08:45 AM   Modules accepted: Orders

## 2023-04-01 NOTE — Progress Notes (Signed)
 Remote ICD transmission.

## 2023-05-09 ENCOUNTER — Other Ambulatory Visit (HOSPITAL_COMMUNITY): Payer: Self-pay

## 2023-05-09 ENCOUNTER — Telehealth (HOSPITAL_COMMUNITY): Payer: Self-pay | Admitting: Internal Medicine

## 2023-05-09 DIAGNOSIS — I5022 Chronic systolic (congestive) heart failure: Secondary | ICD-10-CM

## 2023-05-09 MED ORDER — ROSUVASTATIN CALCIUM 40 MG PO TABS: 40.0000 mg | ORAL_TABLET | Freq: Every day | ORAL | 0 refills | Status: DC

## 2023-05-09 NOTE — Telephone Encounter (Signed)
 Front office left patient messgae to schedule a follow up appt with Dr. Julane Ny in the AHF clinic.

## 2023-05-10 ENCOUNTER — Encounter (HOSPITAL_COMMUNITY): Payer: Self-pay | Admitting: Internal Medicine

## 2023-05-10 ENCOUNTER — Ambulatory Visit (HOSPITAL_COMMUNITY)
Admission: RE | Admit: 2023-05-10 | Discharge: 2023-05-10 | Disposition: A | Discharge status: Home / Self Care | Source: Ambulatory Visit | Attending: Internal Medicine | Admitting: Internal Medicine

## 2023-05-10 VITALS — BP 110/78 | HR 75 | Wt 249.2 lb

## 2023-05-10 DIAGNOSIS — I472 Ventricular tachycardia, unspecified: Secondary | ICD-10-CM | POA: Insufficient documentation

## 2023-05-10 DIAGNOSIS — I2510 Atherosclerotic heart disease of native coronary artery without angina pectoris: Secondary | ICD-10-CM | POA: Insufficient documentation

## 2023-05-10 DIAGNOSIS — Z79899 Other long term (current) drug therapy: Secondary | ICD-10-CM | POA: Diagnosis not present

## 2023-05-10 DIAGNOSIS — I493 Ventricular premature depolarization: Secondary | ICD-10-CM | POA: Diagnosis not present

## 2023-05-10 DIAGNOSIS — I428 Other cardiomyopathies: Secondary | ICD-10-CM | POA: Insufficient documentation

## 2023-05-10 DIAGNOSIS — I447 Left bundle-branch block, unspecified: Secondary | ICD-10-CM | POA: Diagnosis not present

## 2023-05-10 DIAGNOSIS — E1122 Type 2 diabetes mellitus with diabetic chronic kidney disease: Secondary | ICD-10-CM | POA: Insufficient documentation

## 2023-05-10 DIAGNOSIS — N184 Chronic kidney disease, stage 4 (severe): Secondary | ICD-10-CM | POA: Diagnosis not present

## 2023-05-10 DIAGNOSIS — Z8249 Family history of ischemic heart disease and other diseases of the circulatory system: Secondary | ICD-10-CM | POA: Diagnosis not present

## 2023-05-10 DIAGNOSIS — I130 Hypertensive heart and chronic kidney disease with heart failure and stage 1 through stage 4 chronic kidney disease, or unspecified chronic kidney disease: Secondary | ICD-10-CM | POA: Insufficient documentation

## 2023-05-10 DIAGNOSIS — Z85528 Personal history of other malignant neoplasm of kidney: Secondary | ICD-10-CM | POA: Insufficient documentation

## 2023-05-10 DIAGNOSIS — Z905 Acquired absence of kidney: Secondary | ICD-10-CM | POA: Insufficient documentation

## 2023-05-10 DIAGNOSIS — Z95810 Presence of automatic (implantable) cardiac defibrillator: Secondary | ICD-10-CM

## 2023-05-10 DIAGNOSIS — I5022 Chronic systolic (congestive) heart failure: Secondary | ICD-10-CM | POA: Diagnosis not present

## 2023-05-10 DIAGNOSIS — C641 Malignant neoplasm of right kidney, except renal pelvis: Secondary | ICD-10-CM

## 2023-05-10 DIAGNOSIS — G4733 Obstructive sleep apnea (adult) (pediatric): Secondary | ICD-10-CM | POA: Insufficient documentation

## 2023-05-10 DIAGNOSIS — Z7982 Long term (current) use of aspirin: Secondary | ICD-10-CM | POA: Diagnosis not present

## 2023-05-10 LAB — BASIC METABOLIC PANEL WITH GFR
Anion gap: 10 (ref 5–15)
BUN: 49 mg/dL — ABNORMAL HIGH (ref 8–23)
CO2: 23 mmol/L (ref 22–32)
Calcium: 9.9 mg/dL (ref 8.9–10.3)
Chloride: 105 mmol/L (ref 98–111)
Creatinine, Ser: 2.98 mg/dL — ABNORMAL HIGH (ref 0.61–1.24)
GFR, Estimated: 21 mL/min — ABNORMAL LOW (ref >60)
Glucose, Bld: 110 mg/dL — ABNORMAL HIGH (ref 70–99)
Potassium: 4.6 mmol/L (ref 3.5–5.1)
Sodium: 138 mmol/L (ref 135–145)

## 2023-05-10 LAB — BRAIN NATRIURETIC PEPTIDE: B Natriuretic Peptide: 29.9 pg/mL (ref 0.0–100.0)

## 2023-05-10 NOTE — Addendum Note (Signed)
 Encounter addended by: Glorietta Lark, RN on: 05/10/2023 10:36 AM  Actions taken: Order list changed, Diagnosis association updated, Clinical Note Signed, Charge Capture section accepted

## 2023-05-10 NOTE — Patient Instructions (Signed)
 Great to see you today!!!  No changes, continue current medications  Labs done today, your results will be available in MyChart, we will contact you for abnormal readings.  Your physician recommends that you schedule a follow-up appointment in: 6 months (November), **PLEASE CALL OUR OFFICE IN SEPTEMBER TO SCHEDULE THIS APPOINTMENT  If you have any questions or concerns before your next appointment please send us  a message through [place] or call our office at [phone].    TO LEAVE A MESSAGE FOR THE NURSE SELECT OPTION 2, PLEASE LEAVE A MESSAGE INCLUDING: YOUR NAME DATE OF BIRTH CALL BACK NUMBER REASON FOR CALL**this is important as we prioritize the call backs  YOU WILL RECEIVE A CALL BACK THE SAME DAY AS LONG AS YOU CALL BEFORE 4:00 PM  At the [facility], you and your health needs are our priority. As part of our continuing mission to provide you with exceptional heart care, we have created designated Provider Care Teams. These Care Teams include your primary Cardiologist (physician) and Advanced Practice Providers (APPs- Physician Assistants and Nurse Practitioners) who all work together to provide you with the care you need, when you need it.   You may see any of the following providers on your designated Care Team at your next follow up: Dr Jules Oar Dr Peder Bourdon Dr. Alwin Baars Dr. Arta Lark Amy Marijane Shoulders, NP Ruddy Corral, [place] [facility] [place], [place] Dennise Fitz, NP [place] Lee, NP Shaw[place] Dellen, NP Luster Salters, PharmD Bevely Brush, PharmD   Please be sure to bring in all your medications bottles to every appointment.    Thank you for choosing [facility]

## 2023-05-10 NOTE — Progress Notes (Signed)
 ADVANCED HF CLINIC NOTE   Primary Care: Zilphia Hilt, Charyl Coppersmith, MD HF Cardiologist: Dr. Julane Ny  HPI: Dr. 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   Admitted 2/23 with new onset systolic heart failure. Echo EF 20-25% with anterior/apical WMA. Moderate MR. RV normal.Cath with minimal nonobstructive CAD.  cMRI LVEF 16%, RVEF 52%, LGE basal septal midwall (scar pattern seen with NICM). Cardiomyopathy felt to be likely due to LBBB +/- frequent PVCs . EP consulted -> need to wait 3 months for CRT. Discharged home with LifeVest, weight 219 lbs.  Readmitted in 03/20/21 with low output HF and transient shock. Treated with milrinone  and improved. Repeat echo EF 20% . Milrinone  weaned off. Seen by EP regarding CRT but recommended waiting 3 months for GDMT   Renal u/s found large right renal mass and CT confirmed RCC.  Seen in Urology Clinic and felt to need relatively urgent right nephrectomy. s/p R nephrectomy d/t RCC 06/2021. Post nephrectomy HF meds stopped due to hypotension and AKI. Entresto , spironolactone , and torsemide  held at discharge.Treated with Keytruda  for 17 doses  S/p Abbott CRT 10/27/21. Echo 04/13/22 EF < 20% RV ok. Personally reviewed  Today he returns for HF follow up. Last Scr 2.75 on 02/23/23. (Had been running 3.0-3.3)  ICD interrogated: No VT/AF. Fluid was recently elevated but now back to normal. > 99% biv pacing Personally reviewed  Here for routine f/u. Remains busy. No CP or SOB. No edema, orthopnea or PND.  Cardiac Studies: - Echo (2/23): EF 20-25% with anterior/apical WMA suggestive of previous anterior MI. Moderate MR. RV normal    - R/LHC (2/23):   Prox RCA lesion is 30% stenosed.   1st Diag lesion is 20/% stenosed.   Prox Cx to Mid Cx lesion is 20% stenosed.  Ao = 102/70 (85) LV = 104/24 RA =  10 RV = 47/17 PA = 52/22 (33) PCW = 25 Fick cardiac output/index = 5.5/2.5 PVR =1.5  WU Ao sat = 98% PA sat = 65%, 66%   Assessment: 1. 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 Elevated filling pressures with normal cardiac output   - cMRI (2/23): LVEF 16%, RVEF 52%, LGE basal septal midwall (scar pattern seen with NICM).     Past Medical History:  Diagnosis Date   CHF (congestive heart failure) (HCC)    DM (diabetes mellitus), type 2 (HCC)    Hypertension    OSA on CPAP    right renal ca    Current Outpatient Medications  Medication Sig Dispense Refill   acetaminophen  (TYLENOL ) 325 MG tablet Take 1-2 tablets (325-650 mg total) by mouth every 4 (four) hours as needed for mild pain.     aspirin  EC 81 MG tablet Take 1 tablet (81 mg total) by mouth daily. Swallow whole. 90 tablet 3   isosorbide -hydrALAZINE  (BIDIL ) 20-37.5 MG tablet TAKE 1 TABLET BY MOUTH THREE TIMES DAILY 90 tablet 6   levothyroxine  (SYNTHROID ) 50 MCG tablet TAKE 1 TABLET(50 MCG) BY MOUTH DAILY BEFORE BREAKFAST 90 tablet 1   Omega-3 Fatty Acids (FISH OIL) 1000 MG CAPS Take 1,000 mg by mouth daily.     potassium chloride  (KLOR-CON  M) 10 MEQ tablet Take 1 tablet (10 mEq total) by mouth daily. 60 tablet 1   PREVIDENT 5000 ENAMEL PROTECT 1.1-5 % GEL Place 1 application. onto teeth at bedtime.  Probiotic Product (UP4 PROBIOTICS ADULT PO) Take 1 capsule by mouth daily.     rosuvastatin  (CRESTOR ) 40 MG tablet Take 1 tablet (40 mg total) by mouth daily. 30 tablet 0   sodium bicarbonate 650 MG tablet Take 650 mg by mouth 3 (three) times daily.     torsemide  (DEMADEX ) 20 MG tablet TAKE 1 TABLET BY MOUTH DAILY 30 tablet 6   No current facility-administered medications for this encounter.   No Known Allergies  Social History   Socioeconomic History   Marital status: Married    Spouse name: Not on file   Number of children: Not on file   Years of education: Not on file   Highest education level: Not on file  Occupational History   Not on  file  Tobacco Use   Smoking status: Former    Types: Pipe    Quit date: 02/05/1991    Years since quitting: 32.2   Smokeless tobacco: Not on file  Substance and Sexual Activity   Alcohol use: Not on file   Drug use: Not on file   Sexual activity: Not on file  Other Topics Concern   Not on file  Social History Narrative   Not on file   Social Drivers of Health   Financial Resource Strain: Low Risk  (06/01/2022)   Overall Financial Resource Strain (CARDIA)    Difficulty of Paying Living Expenses: Not hard at all  Food Insecurity: No Food Insecurity (06/01/2022)   Hunger Vital Sign    Worried About Running Out of Food in the Last Year: Never true    Ran Out of Food in the Last Year: Never true  Transportation Needs: No Transportation Needs (06/01/2022)   PRAPARE - Administrator, Civil Service (Medical): No    Lack of Transportation (Non-Medical): No  Physical Activity: Sufficiently Active (06/01/2022)   Exercise Vital Sign    Days of Exercise per Week: 7 days    Minutes of Exercise per Session: 60 min  Stress: No Stress Concern Present (06/01/2022)   [employer] of Occupational Health - Occupational Stress Questionnaire    Feeling of Stress : Not at all  Social Connections: Moderately Isolated (06/01/2022)   Social Connection and Isolation Panel [NHANES]    Frequency of Communication with Friends and Family: Once a week    Frequency of Social Gatherings with Friends and Family: Never    Attends Religious Services: Never    Database administrator or Organizations: Yes    Attends Engineer, structural: More than 4 times per year    Marital Status: Married  Catering manager Violence: Not At Risk (06/01/2022)   Humiliation, Afraid, Rape, and Kick questionnaire    Fear of Current or Ex-Partner: No    Emotionally Abused: No    Physically Abused: No    Sexually Abused: No   Family History  Problem Relation Age of Onset   Heart attack Father    Wt Readings  from Last 3 Encounters:  05/10/23 113 kg (249 lb 3.2 oz)  03/03/23 109.8 kg (242 lb)  02/04/23 109.8 kg (242 lb)   BP 110/78   Pulse 75   Wt 113 kg (249 lb 3.2 oz)   SpO2 96%   BMI 38.45 kg/m   PHYSICAL EXAM: General:  Well appearing. No resp difficulty HEENT: normal Neck: supple. no JVD. Carotids 2+ bilat; no bruits. No lymphadenopathy or thryomegaly appreciated. Cor: PMI nondisplaced. Regular rate & rhythm. No rubs, gallops or  murmurs. Lungs: clear Abdomen: obese  soft, nontender, nondistended. No hepatosplenomegaly. No bruits or masses. Good bowel sounds. Extremities: no cyanosis, clubbing, rash, edema Neuro: alert & orientedx3, cranial nerves grossly intact. moves all 4 extremities w/o difficulty.   ECG: Sinus with v-pacing 75 Personally reviewed  ASSESSMENT & PLAN:  1. Chronic systolic HF - Echo 2/23 EF 20-25% with anterior/apical WMA - R/LHC: Minimal non-obstructive CAD with separate left coronary ostia, severe NICM suspect due to LBBB, and elevated filling pressures with normal cardiac output.  - cMRI: LVEF 16%, RV systolic function  52%, basal septal midwall late gadolinium enhancement (scar pattern seen in NICM and associated with worse prognosis), RV insertion LGE  - Readmit ADHF/shock 3/23. Echo EF 20% Briefly required milrinone .  - Echo 05/29/21 EF <20% LV markedly dilated. RV normal. Mild MR.  - s/p Abbott CRT-D 10/23 - Echo 1/24 EF < 20% - Stable NYHA II-III  Volume ok  - Continue torsemide  20mg  daily  -  Off Jardiance  due to CKD IV -  Off spiro/entresto  due to CKD IV - Continue BIDI 1 tab tid. Avoid hypotension with CKD IV  Wil not change today - ICD interrogated personally as above - Symptomatically stable despite no improvement in severely depressed EF with CRT. Will continue to follow closely. Given age and CKD IV options are very limited as not candidate for advanced therapies - repeat echo  - check labs  2. PVCs/NSVT - ICD in place - interrogation as  above. No VT  3. DM2 - A1c 5.4% - On SGLT2i. - Per PCP   4. CKD IV - Continue Jardiance  - Scr now 2.8-3.2 after nephrectomy - Follows with Renal (Dr. Edson Graces) - Recheck today - If scr stable would he be candidate for SGLT2i    5. OSA - Continue CPAP. - [facility]   6. Minimal non-obstructive CAD - On ASA/statin. - No s/s angina - Followed by PCP  7. 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 Post ECG QRS 174 -> 164 - repeat echo  8. Large right renal cell CA - 06/2021 S/P R Nephrectomy - Treated with Keytruda     Jules Oar, MD  10:10 AM

## 2023-05-13 ENCOUNTER — Telehealth: Payer: Self-pay | Admitting: Home Health

## 2023-05-13 DIAGNOSIS — I5022 Chronic systolic (congestive) heart failure: Secondary | ICD-10-CM

## 2023-05-13 MED ORDER — ISOSORB DINITRATE-HYDRALAZINE 20-37.5 MG PO TABS: 1.0000 | ORAL_TABLET | Freq: Three times a day (TID) | ORAL | 3 refills | Status: DC

## 2023-05-13 MED ORDER — ROSUVASTATIN CALCIUM 40 MG PO TABS: 40.0000 mg | ORAL_TABLET | Freq: Every day | ORAL | 1 refills | Status: DC

## 2023-05-13 NOTE — Telephone Encounter (Signed)
 Patient called after hour line, requesting refill for crestor  and bidil , states he was only provided 30 days of crestor  and no refill for bidil  was given. Both refill had been sent to [facility] today.

## 2023-06-03 ENCOUNTER — Encounter: Payer: Self-pay | Admitting: Internal Medicine

## 2023-06-03 ENCOUNTER — Ambulatory Visit (INDEPENDENT_AMBULATORY_CARE_PROVIDER_SITE_OTHER): Payer: Medicare PPO

## 2023-06-03 DIAGNOSIS — I447 Left bundle-branch block, unspecified: Secondary | ICD-10-CM | POA: Diagnosis not present

## 2023-06-03 LAB — CUP PACEART REMOTE DEVICE CHECK
Battery Remaining Longevity: 78 mo
Battery Remaining Percentage: 81 %
Battery Voltage: 2.99 V
Brady Statistic AP VP Percent: 5.4 %
Brady Statistic AP VS Percent: 1 %
Brady Statistic AS VP Percent: 94 %
Brady Statistic AS VS Percent: 1 %
Brady Statistic RA Percent Paced: 5.2 %
Date Time Interrogation Session: 20250530030434
HighPow Impedance: 72 Ohm
Implantable Lead Connection Status: 753985
Implantable Lead Connection Status: 753985
Implantable Lead Connection Status: 753985
Implantable Lead Implant Date: 20231130
Implantable Lead Implant Date: 20231130
Implantable Lead Implant Date: 20231130
Implantable Lead Location: 753858
Implantable Lead Location: 753859
Implantable Lead Location: 753860
Implantable Pulse Generator Implant Date: 20231130
Lead Channel Impedance Value: 440 Ohm
Lead Channel Impedance Value: 610 Ohm
Lead Channel Impedance Value: 730 Ohm
Lead Channel Pacing Threshold Amplitude: 0.375 V
Lead Channel Pacing Threshold Amplitude: 0.625 V
Lead Channel Pacing Threshold Amplitude: 1.5 V
Lead Channel Pacing Threshold Pulse Width: 0.5 ms
Lead Channel Pacing Threshold Pulse Width: 0.5 ms
Lead Channel Pacing Threshold Pulse Width: 0.8 ms
Lead Channel Sensing Intrinsic Amplitude: 12 mV
Lead Channel Sensing Intrinsic Amplitude: 5 mV
Lead Channel Setting Pacing Amplitude: 1.375
Lead Channel Setting Pacing Amplitude: 1.625
Lead Channel Setting Pacing Amplitude: 2 V
Lead Channel Setting Pacing Pulse Width: 0.5 ms
Lead Channel Setting Pacing Pulse Width: 0.8 ms
Lead Channel Setting Sensing Sensitivity: 0.5 mV
Pulse Gen Serial Number: 211010483
Zone Setting Status: 755011

## 2023-06-05 ENCOUNTER — Other Ambulatory Visit (HOSPITAL_COMMUNITY): Payer: Self-pay | Admitting: Internal Medicine

## 2023-06-06 ENCOUNTER — Ambulatory Visit: Payer: Self-pay | Admitting: Cardiology

## 2023-06-07 ENCOUNTER — Ambulatory Visit (INDEPENDENT_AMBULATORY_CARE_PROVIDER_SITE_OTHER): Payer: Medicare PPO | Admitting: Internal Medicine

## 2023-06-07 ENCOUNTER — Encounter: Payer: Self-pay | Admitting: Internal Medicine

## 2023-06-07 ENCOUNTER — Ambulatory Visit: Payer: Self-pay

## 2023-06-07 ENCOUNTER — Telehealth: Payer: Self-pay | Admitting: Internal Medicine

## 2023-06-07 VITALS — BP 120/80 | HR 80 | Temp 97.7°F | Ht 68.0 in | Wt 248.2 lb

## 2023-06-07 DIAGNOSIS — B372 Candidiasis of skin and nail: Secondary | ICD-10-CM

## 2023-06-07 DIAGNOSIS — E1169 Type 2 diabetes mellitus with other specified complication: Secondary | ICD-10-CM

## 2023-06-07 DIAGNOSIS — C641 Malignant neoplasm of right kidney, except renal pelvis: Secondary | ICD-10-CM | POA: Diagnosis not present

## 2023-06-07 DIAGNOSIS — I152 Hypertension secondary to endocrine disorders: Secondary | ICD-10-CM | POA: Diagnosis not present

## 2023-06-07 DIAGNOSIS — E1159 Type 2 diabetes mellitus with other circulatory complications: Secondary | ICD-10-CM | POA: Diagnosis not present

## 2023-06-07 DIAGNOSIS — I5022 Chronic systolic (congestive) heart failure: Secondary | ICD-10-CM

## 2023-06-07 DIAGNOSIS — G4733 Obstructive sleep apnea (adult) (pediatric): Secondary | ICD-10-CM

## 2023-06-07 DIAGNOSIS — N179 Acute kidney failure, unspecified: Secondary | ICD-10-CM

## 2023-06-07 DIAGNOSIS — Z1211 Encounter for screening for malignant neoplasm of colon: Secondary | ICD-10-CM

## 2023-06-07 DIAGNOSIS — E785 Hyperlipidemia, unspecified: Secondary | ICD-10-CM

## 2023-06-07 DIAGNOSIS — Z1159 Encounter for screening for other viral diseases: Secondary | ICD-10-CM | POA: Diagnosis not present

## 2023-06-07 DIAGNOSIS — Z0000 Encounter for general adult medical examination without abnormal findings: Secondary | ICD-10-CM

## 2023-06-07 LAB — PSA: PSA: 8.43 ng/mL — ABNORMAL HIGH (ref 0.10–4.00)

## 2023-06-07 LAB — CBC WITH DIFFERENTIAL/PLATELET
Basophils Absolute: 0.1 10*3/uL (ref 0.0–0.1)
Basophils Relative: 0.9 % (ref 0.0–3.0)
Eosinophils Absolute: 0.2 10*3/uL (ref 0.0–0.7)
Eosinophils Relative: 3.2 % (ref 0.0–5.0)
HCT: 35.4 % — ABNORMAL LOW (ref 39.0–52.0)
Hemoglobin: 12.1 g/dL — ABNORMAL LOW (ref 13.0–17.0)
Lymphocytes Relative: 16.3 % (ref 12.0–46.0)
Lymphs Abs: 1.2 10*3/uL (ref 0.7–4.0)
MCHC: 34.2 g/dL (ref 30.0–36.0)
MCV: 80.9 fl (ref 78.0–100.0)
Monocytes Absolute: 1 10*3/uL (ref 0.1–1.0)
Monocytes Relative: 14.7 % — ABNORMAL HIGH (ref 3.0–12.0)
Neutro Abs: 4.6 10*3/uL (ref 1.4–7.7)
Neutrophils Relative %: 64.9 % (ref 43.0–77.0)
Platelets: 155 10*3/uL (ref 150.0–400.0)
RBC: 4.38 Mil/uL (ref 4.22–5.81)
RDW: 16.7 % — ABNORMAL HIGH (ref 11.5–15.5)
WBC: 7.1 10*3/uL (ref 4.0–10.5)

## 2023-06-07 LAB — COMPREHENSIVE METABOLIC PANEL WITH GFR
ALT: 15 U/L (ref 0–53)
AST: 27 U/L (ref 0–37)
Albumin: 4.5 g/dL (ref 3.5–5.2)
Alkaline Phosphatase: 43 U/L (ref 39–117)
BUN: 48 mg/dL — ABNORMAL HIGH (ref 6–23)
CO2: 22 meq/L (ref 19–32)
Calcium: 10 mg/dL (ref 8.4–10.5)
Chloride: 105 meq/L (ref 96–112)
Creatinine, Ser: 3.83 mg/dL — ABNORMAL HIGH (ref 0.40–1.50)
GFR: 14.76 mL/min — CL (ref >60.00)
Glucose, Bld: 80 mg/dL (ref 70–99)
Potassium: 4.1 meq/L (ref 3.5–5.1)
Sodium: 136 meq/L (ref 135–145)
Total Bilirubin: 0.7 mg/dL (ref 0.2–1.2)
Total Protein: 7.3 g/dL (ref 6.0–8.3)

## 2023-06-07 LAB — VITAMIN D 25 HYDROXY (VIT D DEFICIENCY, FRACTURES): VITD: 26.27 ng/mL — ABNORMAL LOW (ref 30.00–100.00)

## 2023-06-07 LAB — TSH: TSH: 3.91 u[IU]/mL (ref 0.35–5.50)

## 2023-06-07 LAB — HEMOGLOBIN A1C: Hgb A1c MFr Bld: 6.6 % — ABNORMAL HIGH (ref 4.6–6.5)

## 2023-06-07 LAB — LIPID PANEL
Cholesterol: 133 mg/dL (ref 0–200)
HDL: 67.3 mg/dL (ref >39.00)
LDL Cholesterol: 56 mg/dL (ref 0–99)
NonHDL: 65.97
Total CHOL/HDL Ratio: 2
Triglycerides: 51 mg/dL (ref 0.0–149.0)
VLDL: 10.2 mg/dL (ref 0.0–40.0)

## 2023-06-07 LAB — MICROALBUMIN / CREATININE URINE RATIO
Creatinine,U: 0 mg/dL
Microalb Creat Ratio: UNDETERMINED mg/g (ref 0.0–30.0)
Microalb, Ur: <0.7 mg/dL

## 2023-06-07 LAB — VITAMIN B12: Vitamin B-12: 772 pg/mL (ref 211–911)

## 2023-06-07 MED ORDER — NYSTATIN 100000 UNIT/GM EX POWD: 1.0000 | Freq: Three times a day (TID) | CUTANEOUS | 3 refills | Status: AC

## 2023-06-07 NOTE — Telephone Encounter (Signed)
 Copied from CRM [id number]. Topic: Clinical - Lab/Test Results >> Jun 07, 2023  5:25 PM Abigail D wrote: Reason for CRM: Shanequa from [facility] [address] calling to report stat labs for Mr. Neeb.  Corinthia Dickinson from [facility] care of [place]. Answer Assessment - Initial Assessment Questions 1. REASON FOR CALL or QUESTION: "What is your reason for calling today?" or "How can I best help you?" or "What question do you have that I can help answer?"    Critical  Gfr 14.76 called in by [facility] from [facility] care of [facility].  Protocols used: Information Only Call - No Triage-A-AH

## 2023-06-07 NOTE — Telephone Encounter (Signed)
 Called Dr. Boston Byers - on call and reported critical lab of GFR.

## 2023-06-07 NOTE — Telephone Encounter (Signed)
 On call note for GFR drop to 14. Notified Primary Care Provider (PCP) and patient by messaging.

## 2023-06-07 NOTE — Progress Notes (Signed)
 Established Patient Office Visit     CC/Reason for Visit: Annual preventive exam and subsequent Medicare wellness visit  HPI: Bryce Cain is a 73 y.o. male who is coming in today for the above mentioned reasons. Past Medical History is significant for: Hypertension, hyperlipidemia, type 2 diabetes, nonischemic cardiomyopathy with systolic heart failure, OSA, history of renal cell carcinoma followed by oncology.  He has been having a rash under his abdominal fold.  He has been applying Lamisil cream with some relief.  Is overdue for Tdap booster, due for colonoscopy.   Past Medical/Surgical History: Past Medical History:  Diagnosis Date   CHF (congestive heart failure) (HCC)    DM (diabetes mellitus), type 2 (HCC)    Hypertension    OSA on CPAP    right renal ca     Past Surgical History:  Procedure Laterality Date   BIV ICD INSERTION CRT-D N/A 12/03/2021   Procedure: BIV ICD INSERTION CRT-D;  Surgeon: Boyce Byes, MD;  Location: [facility] INVASIVE CV LAB;  Service: Cardiovascular;  Laterality: N/A;   RIGHT/LEFT HEART CATH AND CORONARY ANGIOGRAPHY N/A 02/16/2021   Procedure: RIGHT/LEFT HEART CATH AND CORONARY ANGIOGRAPHY;  Surgeon: Mardell Shade, MD;  Location: [facility] INVASIVE CV LAB;  Service: Cardiovascular;  Laterality: N/A;   ROBOT ASSISTED LAPAROSCOPIC NEPHRECTOMY Right 06/23/2021   Procedure: XI ROBOTIC ASSISTED LAPAROSCOPIC NEPHRECTOMY;  Surgeon: Adelbert Homans, MD;  Location: [facility] ORS;  Service: Urology;  Laterality: Right;    Social History:  reports that he quit smoking about 32 years ago. His smoking use included pipe. He does not have any smokeless tobacco history on file. No history on file for alcohol use and drug use.  Allergies: No Known Allergies  Family History:  Family History  Problem Relation Age of Onset   Heart attack Father      Current Outpatient Medications:    acetaminophen  (TYLENOL ) 325 MG tablet, Take 1-2 tablets (325-650 mg  total) by mouth every 4 (four) hours as needed for mild pain., Disp: , Rfl:    aspirin  EC 81 MG tablet, Take 1 tablet (81 mg total) by mouth daily. Swallow whole., Disp: 90 tablet, Rfl: 3   isosorbide -hydrALAZINE  (BIDIL ) 20-37.5 MG tablet, Take 1 tablet by mouth 3 (three) times daily., Disp: 90 tablet, Rfl: 3   levothyroxine  (SYNTHROID ) 50 MCG tablet, TAKE 1 TABLET(50 MCG) BY MOUTH DAILY BEFORE BREAKFAST, Disp: 90 tablet, Rfl: 1   nystatin  (MYCOSTATIN /NYSTOP ) powder, Apply 1 Application topically 3 (three) times daily., Disp: 60 g, Rfl: 3   Omega-3 Fatty Acids (FISH OIL) 1000 MG CAPS, Take 1,000 mg by mouth daily., Disp: , Rfl:    potassium chloride  (KLOR-CON  M) 10 MEQ tablet, Take 1 tablet (10 mEq total) by mouth daily., Disp: 60 tablet, Rfl: 1   PREVIDENT 5000 ENAMEL PROTECT 1.1-5 % GEL, Place 1 application. onto teeth at bedtime., Disp: , Rfl:    Probiotic Product (UP4 PROBIOTICS ADULT PO), Take 1 capsule by mouth daily., Disp: , Rfl:    rosuvastatin  (CRESTOR ) 40 MG tablet, Take 1 tablet (40 mg total) by mouth daily., Disp: 90 tablet, Rfl: 1   sodium bicarbonate 650 MG tablet, Take 650 mg by mouth 3 (three) times daily., Disp: , Rfl:    torsemide  (DEMADEX ) 20 MG tablet, TAKE 1 TABLET BY MOUTH DAILY, Disp: 30 tablet, Rfl: 6  Review of Systems:  Negative unless indicated in HPI.   Physical Exam: Vitals:   06/07/23 1315  BP: 120/80  Pulse: 80  Temp: 97.7 F (36.5 C)  TempSrc: Oral  SpO2: 97%  Weight: 248 lb 3.2 oz (112.6 kg)  Height: 5\' 8"  (1.727 m)    Body mass index is 37.74 kg/m.   Physical Exam Vitals reviewed.  Constitutional:      General: He is not in acute distress.    Appearance: Normal appearance. He is obese. He is not ill-appearing, toxic-appearing or diaphoretic.  HENT:     Head: Normocephalic.     Right Ear: Tympanic membrane, ear canal and external ear normal. There is no impacted cerumen.     Left Ear: Tympanic membrane, ear canal and external ear normal.  There is no impacted cerumen.     Nose: Nose normal.     Mouth/Throat:     Mouth: Mucous membranes are moist.     Pharynx: Oropharynx is clear. No oropharyngeal exudate or posterior oropharyngeal erythema.  Eyes:     General: No scleral icterus.       Right eye: No discharge.        Left eye: No discharge.     Conjunctiva/sclera: Conjunctivae normal.     Pupils: Pupils are equal, round, and reactive to light.  Neck:     Vascular: No carotid bruit.  Cardiovascular:     Rate and Rhythm: Normal rate and regular rhythm.     Pulses: Normal pulses.     Heart sounds: Normal heart sounds.  Pulmonary:     Effort: Pulmonary effort is normal. No respiratory distress.     Breath sounds: Normal breath sounds.  Abdominal:     General: Abdomen is flat. Bowel sounds are normal.     Palpations: Abdomen is soft.  Musculoskeletal:        General: Normal range of motion.     Cervical back: Normal range of motion.  Skin:    General: Skin is warm and dry.     Comments: Yeast dermatitis under abdominal fold.  Neurological:     General: No focal deficit present.     Mental Status: He is alert and oriented to person, place, and time. Mental status is at baseline.  Psychiatric:        Mood and Affect: Mood normal.        Behavior: Behavior normal.        Thought Content: Thought content normal.        Judgment: Judgment normal.    Subsequent Medicare wellness visit   1. Risk factors, based on past  M,S,F - Cardiac Risk Factors include: advanced age (>77men, >64 women);diabetes mellitus   2.  Physical activities: Dietary issues and exercise activities discussed:      3.  Depression/mood:  Flowsheet Row Office Visit from 06/01/2022 in [facility] Total Score 0        4.  ADL's:    06/07/2023    1:03 PM  In your present state of health, do you have any difficulty performing the following activities:  Hearing? 0  Vision? 0  Difficulty concentrating  or making decisions? 0  Walking or climbing stairs? 0  Dressing or bathing? 0  Doing errands, shopping? 0  Preparing Food and eating ? N  Using the Toilet? N  In the past six months, have you accidently leaked urine? N  Do you have problems with loss of bowel control? N  Managing your Medications? N  Managing your Finances? N  Housekeeping or managing your Housekeeping? N  5.  Fall risk:     12/04/2021    8:00 AM 12/09/2021    2:50 PM 01/06/2022    2:12 PM 06/01/2022    1:29 PM 06/07/2023    1:06 PM  Fall Risk  Falls in the past year?    0 0  Was there an injury with Fall?    0 0  Fall Risk Category Calculator    0 0  (RETIRED) Patient Fall Risk Level Moderate fall risk Low fall risk Low fall risk    Fall risk Follow up    Falls evaluation completed Falls evaluation completed     6.  Home safety: No problems identified   7.  Height weight, and visual acuity: height and weight as above, vision/hearing: Vision Screening   Right eye Left eye Both eyes  Without correction     With correction 20/25 20/25 20/25      8.  Counseling: Counseling given: Not Answered    9. Lab orders based on risk factors: Laboratory update will be reviewed   10. Cognitive assessment:        06/07/2023    1:07 PM 06/01/2022    1:30 PM  6CIT Screen  What Year? 0 points 0 points  What month? 0 points 0 points  What time? 0 points 0 points  Count back from 20 0 points 0 points  Months in reverse 0 points 0 points  Repeat phrase 0 points 0 points  Total Score 0 points 0 points     11. Screening: Patient provided with a written and personalized 5-10 year screening schedule in the AVS. Health Maintenance  Topic Date Due   Yearly kidney health urinalysis for diabetes  Never done   Hepatitis C Screening  Never done   DTaP/Tdap/Td vaccine (1 - Tdap) Never done   Pneumonia Vaccine (1 of 2 - PCV) Never done   Colon Cancer Screening  Never done   COVID-19 Vaccine (3 - Pfizer risk series)  03/28/2019   Flu Shot  08/05/2023   Yearly kidney function blood test for diabetes  05/09/2024   Medicare Annual Wellness Visit  06/06/2024   Zoster (Shingles) Vaccine  Completed   HPV Vaccine  Aged Out   Meningitis B Vaccine  Aged Out    12. Provider List Update: Patient Care Team    Relationship Specialty Notifications Start End  Zilphia Hilt, Charyl Coppersmith, MD PCP - General Internal Medicine  05/25/21   Boyce Byes, MD PCP - Electrophysiology Cardiology  10/20/21      13. Advance Directives: Does Patient Have a Medical Advance Directive?: No Would patient like information on creating a medical advance directive?: No - Patient declined  14. Opioids: Patient is not on any opioid prescriptions and has no risk factors for a substance use disorder.   15.   Goals      Become More Active                                                 - choose a type of activity I enjoy such as biking, gardening, team sports, walking    Why is this important?   It is easy to come up with reasons not to exercise.  These steps will help you get started and have fun doing it.    Notes:  other     Publication.          I have personally reviewed and noted the following in the patient's chart:   Medical and social history Use of alcohol, tobacco or illicit drugs  Current medications and supplements Functional ability and status Nutritional status Physical activity Advanced directives List of other physicians Hospitalizations, surgeries, and ER visits in previous 12 months Vitals Screenings to include cognitive, depression, and falls Referrals and appointments  In addition, I have reviewed and discussed with patient certain preventive protocols, quality metrics, and best practice recommendations. A written personalized care plan for preventive services as well as general preventive health recommendations were provided to patient.   Impression and Plan:  Medicare annual  wellness visit, subsequent -     PSA; Future  Type 2 diabetes mellitus with other specified complication, without long-term current use of insulin  (HCC) -     CBC with Differential/Platelet; Future -     Comprehensive metabolic panel with GFR; Future -     Microalbumin / creatinine urine ratio -     Hemoglobin A1c; Future  Hypertension associated with diabetes (HCC) -     TSH; Future -     Vitamin B12; Future -     VITAMIN D  25 Hydroxy (Vit-D Deficiency, Fractures); Future  Hyperlipidemia associated with type 2 diabetes mellitus (HCC) -     Lipid panel; Future  Screening for colon cancer -     Ambulatory referral to Gastroenterology  OSA on CPAP  Renal cell carcinoma of right kidney (HCC)  Chronic systolic congestive heart failure (HCC)  Encounter for hepatitis C screening test for low risk patient -     Hepatitis C antibody; Future  Yeast dermatitis -     Nystatin ; Apply 1 Application topically 3 (three) times daily.  Dispense: 60 g; Refill: 3    -Recommend routine eye and dental care. -Healthy lifestyle discussed in detail. -Labs to be updated today. -Prostate cancer screening: PSA today Health Maintenance  Topic Date Due   Yearly kidney health urinalysis for diabetes  Never done   Hepatitis C Screening  Never done   DTaP/Tdap/Td vaccine (1 - Tdap) Never done   Pneumonia Vaccine (1 of 2 - PCV) Never done   Colon Cancer Screening  Never done   COVID-19 Vaccine (3 - Pfizer risk series) 03/28/2019   Flu Shot  08/05/2023   Yearly kidney function blood test for diabetes  05/09/2024   Medicare Annual Wellness Visit  06/06/2024   Zoster (Shingles) Vaccine  Completed   HPV Vaccine  Aged Out   Meningitis B Vaccine  Aged Out    - Advised Tdap booster at pharmacy. - Nystatin  powder for yeast dermatitis of abdominal fold.    Marguerita Shih, MD [facility]

## 2023-06-08 ENCOUNTER — Ambulatory Visit: Payer: Self-pay | Admitting: Internal Medicine

## 2023-06-08 DIAGNOSIS — N184 Chronic kidney disease, stage 4 (severe): Secondary | ICD-10-CM | POA: Insufficient documentation

## 2023-06-08 DIAGNOSIS — R972 Elevated prostate specific antigen [PSA]: Secondary | ICD-10-CM | POA: Insufficient documentation

## 2023-06-08 DIAGNOSIS — E559 Vitamin D deficiency, unspecified: Secondary | ICD-10-CM | POA: Insufficient documentation

## 2023-06-08 LAB — HEPATITIS C ANTIBODY: Hepatitis C Ab: NONREACTIVE

## 2023-06-08 MED ORDER — VITAMIN D (ERGOCALCIFEROL) 1.25 MG (50000 UNIT) PO CAPS: 50000.0000 [IU] | ORAL_CAPSULE | ORAL | 0 refills | Status: AC

## 2023-06-09 ENCOUNTER — Telehealth (HOSPITAL_COMMUNITY): Payer: Self-pay | Admitting: *Deleted

## 2023-06-09 NOTE — Telephone Encounter (Signed)
 Refill sent in

## 2023-06-09 NOTE — Telephone Encounter (Signed)
 Patient left voicemail on afib clinic phone requesting toresmide refill to [facility] on [address]. Pt states he has been out of the medication for 2 days and needs fill asap. Will forward to Dr Bensimhon's office for management.

## 2023-06-21 DIAGNOSIS — E119 Type 2 diabetes mellitus without complications: Secondary | ICD-10-CM | POA: Diagnosis not present

## 2023-06-21 LAB — HM DIABETES EYE EXAM

## 2023-06-28 ENCOUNTER — Ambulatory Visit (HOSPITAL_COMMUNITY)
Admission: RE | Admit: 2023-06-28 | Discharge: 2023-06-28 | Disposition: A | Discharge status: Home / Self Care | Source: Ambulatory Visit | Attending: Internal Medicine | Admitting: Internal Medicine

## 2023-06-28 DIAGNOSIS — I5022 Chronic systolic (congestive) heart failure: Secondary | ICD-10-CM

## 2023-06-28 DIAGNOSIS — I447 Left bundle-branch block, unspecified: Secondary | ICD-10-CM | POA: Insufficient documentation

## 2023-06-28 DIAGNOSIS — Z950 Presence of cardiac pacemaker: Secondary | ICD-10-CM | POA: Insufficient documentation

## 2023-06-28 DIAGNOSIS — I080 Rheumatic disorders of both mitral and aortic valves: Secondary | ICD-10-CM | POA: Diagnosis not present

## 2023-06-28 DIAGNOSIS — E119 Type 2 diabetes mellitus without complications: Secondary | ICD-10-CM | POA: Insufficient documentation

## 2023-06-28 LAB — ECHOCARDIOGRAM COMPLETE
Calc EF: 56.6 %
S' Lateral: 2.6 cm
Single Plane A2C EF: 54.5 %
Single Plane A4C EF: 56.4 %

## 2023-07-09 ENCOUNTER — Other Ambulatory Visit: Payer: Self-pay | Admitting: Physician Assistant

## 2023-07-20 NOTE — Progress Notes (Signed)
 Remote ICD transmission.

## 2023-08-23 ENCOUNTER — Other Ambulatory Visit: Payer: Self-pay | Admitting: Internal Medicine

## 2023-08-23 ENCOUNTER — Inpatient Hospital Stay: Payer: Medicare PPO | Attending: Internal Medicine

## 2023-08-23 ENCOUNTER — Ambulatory Visit (HOSPITAL_COMMUNITY)
Admission: RE | Admit: 2023-08-23 | Discharge: 2023-08-23 | Disposition: A | Discharge status: Home / Self Care | Source: Ambulatory Visit | Attending: Internal Medicine | Admitting: Internal Medicine

## 2023-08-23 DIAGNOSIS — C641 Malignant neoplasm of right kidney, except renal pelvis: Secondary | ICD-10-CM | POA: Insufficient documentation

## 2023-08-23 DIAGNOSIS — R911 Solitary pulmonary nodule: Secondary | ICD-10-CM | POA: Diagnosis not present

## 2023-08-23 DIAGNOSIS — Z905 Acquired absence of kidney: Secondary | ICD-10-CM | POA: Diagnosis not present

## 2023-08-23 DIAGNOSIS — Z85528 Personal history of other malignant neoplasm of kidney: Secondary | ICD-10-CM | POA: Diagnosis not present

## 2023-08-23 DIAGNOSIS — I700 Atherosclerosis of aorta: Secondary | ICD-10-CM | POA: Diagnosis not present

## 2023-08-23 DIAGNOSIS — N281 Cyst of kidney, acquired: Secondary | ICD-10-CM | POA: Diagnosis not present

## 2023-08-23 LAB — CMP (CANCER CENTER ONLY)
ALT: 15 U/L (ref 0–44)
AST: 26 U/L (ref 15–41)
Albumin: 4.2 g/dL (ref 3.5–5.0)
Alkaline Phosphatase: 41 U/L (ref 38–126)
Anion gap: 6 (ref 5–15)
BUN: 43 mg/dL — ABNORMAL HIGH (ref 8–23)
CO2: 27 mmol/L (ref 22–32)
Calcium: 9.8 mg/dL (ref 8.9–10.3)
Chloride: 105 mmol/L (ref 98–111)
Creatinine: 2.84 mg/dL — ABNORMAL HIGH (ref 0.61–1.24)
GFR, Estimated: 22 mL/min — ABNORMAL LOW (ref >60)
Glucose, Bld: 88 mg/dL (ref 70–99)
Potassium: 4.4 mmol/L (ref 3.5–5.1)
Sodium: 138 mmol/L (ref 135–145)
Total Bilirubin: 0.5 mg/dL (ref 0.0–1.2)
Total Protein: 7.3 g/dL (ref 6.5–8.1)

## 2023-08-23 LAB — CBC WITH DIFFERENTIAL (CANCER CENTER ONLY)
Abs Immature Granulocytes: 0.03 K/uL (ref 0.00–0.07)
Basophils Absolute: 0 K/uL (ref 0.0–0.1)
Basophils Relative: 1 %
Eosinophils Absolute: 0.2 K/uL (ref 0.0–0.5)
Eosinophils Relative: 3 %
HCT: 31.7 % — ABNORMAL LOW (ref 39.0–52.0)
Hemoglobin: 11.1 g/dL — ABNORMAL LOW (ref 13.0–17.0)
Immature Granulocytes: 1 %
Lymphocytes Relative: 15 %
Lymphs Abs: 0.9 K/uL (ref 0.7–4.0)
MCH: 27.8 pg (ref 26.0–34.0)
MCHC: 35 g/dL (ref 30.0–36.0)
MCV: 79.3 fL — ABNORMAL LOW (ref 80.0–100.0)
Monocytes Absolute: 0.7 K/uL (ref 0.1–1.0)
Monocytes Relative: 12 %
Neutro Abs: 4 K/uL (ref 1.7–7.7)
Neutrophils Relative %: 68 %
Platelet Count: 144 K/uL — ABNORMAL LOW (ref 150–400)
RBC: 4 MIL/uL — ABNORMAL LOW (ref 4.22–5.81)
RDW: 16.2 % — ABNORMAL HIGH (ref 11.5–15.5)
WBC Count: 5.8 K/uL (ref 4.0–10.5)
nRBC: 0 % (ref 0.0–0.2)

## 2023-08-29 ENCOUNTER — Telehealth: Payer: Self-pay | Admitting: Physician Assistant

## 2023-08-29 NOTE — Telephone Encounter (Signed)
 Rescheduled appointment per the patients request.

## 2023-08-30 ENCOUNTER — Inpatient Hospital Stay: Payer: Medicare PPO | Admitting: Internal Medicine

## 2023-09-02 ENCOUNTER — Ambulatory Visit (INDEPENDENT_AMBULATORY_CARE_PROVIDER_SITE_OTHER): Payer: Medicare PPO

## 2023-09-02 DIAGNOSIS — I447 Left bundle-branch block, unspecified: Secondary | ICD-10-CM | POA: Diagnosis not present

## 2023-09-03 ENCOUNTER — Ambulatory Visit: Payer: Self-pay | Admitting: Cardiology

## 2023-09-03 LAB — CUP PACEART REMOTE DEVICE CHECK
Battery Remaining Longevity: 74 mo
Battery Remaining Percentage: 78 %
Battery Voltage: 2.99 V
Brady Statistic AP VP Percent: 4.9 %
Brady Statistic AP VS Percent: 1 %
Brady Statistic AS VP Percent: 94 %
Brady Statistic AS VS Percent: 1 %
Brady Statistic RA Percent Paced: 4.7 %
Date Time Interrogation Session: 20250829031132
HighPow Impedance: 71 Ohm
Implantable Lead Connection Status: 753985
Implantable Lead Connection Status: 753985
Implantable Lead Connection Status: 753985
Implantable Lead Implant Date: 20231130
Implantable Lead Implant Date: 20231130
Implantable Lead Implant Date: 20231130
Implantable Lead Location: 753858
Implantable Lead Location: 753859
Implantable Lead Location: 753860
Implantable Pulse Generator Implant Date: 20231130
Lead Channel Impedance Value: 440 Ohm
Lead Channel Impedance Value: 590 Ohm
Lead Channel Impedance Value: 690 Ohm
Lead Channel Pacing Threshold Amplitude: 0.375 V
Lead Channel Pacing Threshold Amplitude: 0.5 V
Lead Channel Pacing Threshold Amplitude: 1.5 V
Lead Channel Pacing Threshold Pulse Width: 0.5 ms
Lead Channel Pacing Threshold Pulse Width: 0.5 ms
Lead Channel Pacing Threshold Pulse Width: 0.8 ms
Lead Channel Sensing Intrinsic Amplitude: 12 mV
Lead Channel Sensing Intrinsic Amplitude: 5 mV
Lead Channel Setting Pacing Amplitude: 1.375
Lead Channel Setting Pacing Amplitude: 1.5 V
Lead Channel Setting Pacing Amplitude: 2 V
Lead Channel Setting Pacing Pulse Width: 0.5 ms
Lead Channel Setting Pacing Pulse Width: 0.8 ms
Lead Channel Setting Sensing Sensitivity: 0.5 mV
Pulse Gen Serial Number: 211010483
Zone Setting Status: 755011

## 2023-09-07 ENCOUNTER — Encounter: Payer: Self-pay | Admitting: Internal Medicine

## 2023-09-07 ENCOUNTER — Ambulatory Visit: Admitting: Internal Medicine

## 2023-09-07 VITALS — BP 110/80 | HR 65 | Temp 97.8°F | Wt 252.8 lb

## 2023-09-07 DIAGNOSIS — Z23 Encounter for immunization: Secondary | ICD-10-CM

## 2023-09-07 DIAGNOSIS — G4733 Obstructive sleep apnea (adult) (pediatric): Secondary | ICD-10-CM | POA: Diagnosis not present

## 2023-09-07 DIAGNOSIS — Z85528 Personal history of other malignant neoplasm of kidney: Secondary | ICD-10-CM

## 2023-09-07 DIAGNOSIS — C641 Malignant neoplasm of right kidney, except renal pelvis: Secondary | ICD-10-CM

## 2023-09-07 DIAGNOSIS — E1122 Type 2 diabetes mellitus with diabetic chronic kidney disease: Secondary | ICD-10-CM | POA: Diagnosis not present

## 2023-09-07 DIAGNOSIS — R972 Elevated prostate specific antigen [PSA]: Secondary | ICD-10-CM

## 2023-09-07 DIAGNOSIS — N184 Chronic kidney disease, stage 4 (severe): Secondary | ICD-10-CM | POA: Diagnosis not present

## 2023-09-07 DIAGNOSIS — E1169 Type 2 diabetes mellitus with other specified complication: Secondary | ICD-10-CM

## 2023-09-07 LAB — POCT GLYCOSYLATED HEMOGLOBIN (HGB A1C): Hemoglobin A1C: 6.1 % — AB (ref 4.0–5.6)

## 2023-09-07 NOTE — Addendum Note (Signed)
 Addended by: KATHRYNE MILLMAN B on: 09/07/2023 02:16 PM   Modules accepted: Orders

## 2023-09-07 NOTE — Progress Notes (Addendum)
 Established Patient Office Visit     CC/Reason for Visit: Follow-up chronic medical conditions  HPI: Bryce Cain is a 73 y.o. male who is coming in today for the above mentioned reasons. Past Medical History is significant for: Hypertension, hyperlipidemia, type 2 diabetes, CKD, nonischemic cardiomyopathy obstructive sleep apnea, vitamin D  deficiency,, history of renal cell carcinoma.  He has been feeling well.  He unfortunately did not take the vitamin D  that was prescribed.  He never saw urology for his elevated PSA.   Past Medical/Surgical History: Past Medical History:  Diagnosis Date   CHF (congestive heart failure) (HCC)    DM (diabetes mellitus), type 2 (HCC)    Hypertension    OSA on CPAP    right renal ca     Past Surgical History:  Procedure Laterality Date   BIV ICD INSERTION CRT-D N/A 12/03/2021   Procedure: BIV ICD INSERTION CRT-D;  Surgeon: Cindie Ole DASEN, MD;  Location: [facility] INVASIVE CV LAB;  Service: Cardiovascular;  Laterality: N/A;   RIGHT/LEFT HEART CATH AND CORONARY ANGIOGRAPHY N/A 02/16/2021   Procedure: RIGHT/LEFT HEART CATH AND CORONARY ANGIOGRAPHY;  Surgeon: Cherrie Toribio SAUNDERS, MD;  Location: [facility] INVASIVE CV LAB;  Service: Cardiovascular;  Laterality: N/A;   ROBOT ASSISTED LAPAROSCOPIC NEPHRECTOMY Right 06/23/2021   Procedure: XI ROBOTIC ASSISTED LAPAROSCOPIC NEPHRECTOMY;  Surgeon: Devere Lonni Righter, MD;  Location: [facility] ORS;  Service: Urology;  Laterality: Right;    Social History:  reports that he quit smoking about 32 years ago. His smoking use included pipe. He does not have any smokeless tobacco history on file. No history on file for alcohol use and drug use.  Allergies: No Known Allergies  Family History:  Family History  Problem Relation Age of Onset   Heart attack Father      Current Outpatient Medications:    acetaminophen  (TYLENOL ) 325 MG tablet, Take 1-2 tablets (325-650 mg total) by mouth every 4 (four) hours as needed for  mild pain., Disp: , Rfl:    aspirin  EC 81 MG tablet, Take 1 tablet (81 mg total) by mouth daily. Swallow whole., Disp: 90 tablet, Rfl: 3   levothyroxine  (SYNTHROID ) 50 MCG tablet, TAKE 1 TABLET(50 MCG) BY MOUTH DAILY BEFORE BREAKFAST, Disp: 90 tablet, Rfl: 1   nystatin  (MYCOSTATIN /NYSTOP ) powder, Apply 1 Application topically 3 (three) times daily., Disp: 60 g, Rfl: 3   Omega-3 Fatty Acids (FISH OIL) 1000 MG CAPS, Take 1,000 mg by mouth daily., Disp: , Rfl:    potassium chloride  (KLOR-CON  M) 10 MEQ tablet, Take 1 tablet (10 mEq total) by mouth daily., Disp: 60 tablet, Rfl: 1   PREVIDENT 5000 ENAMEL PROTECT 1.1-5 % GEL, Place 1 application. onto teeth at bedtime., Disp: , Rfl:    Probiotic Product (UP4 PROBIOTICS ADULT PO), Take 1 capsule by mouth daily., Disp: , Rfl:    rosuvastatin  (CRESTOR ) 40 MG tablet, Take 1 tablet (40 mg total) by mouth daily., Disp: 90 tablet, Rfl: 1   sodium bicarbonate 650 MG tablet, Take 650 mg by mouth 3 (three) times daily., Disp: , Rfl:    torsemide  (DEMADEX ) 20 MG tablet, TAKE 1 TABLET BY MOUTH DAILY, Disp: 90 tablet, Rfl: 3   isosorbide -hydrALAZINE  (BIDIL ) 20-37.5 MG tablet, Take 1 tablet by mouth 3 (three) times daily., Disp: 90 tablet, Rfl: 6  Review of Systems:  Negative unless indicated in HPI.   Physical Exam: Vitals:   09/07/23 1337  BP: 110/80  Pulse: 65  Temp: 97.8 F (36.6 C)  TempSrc: Oral  SpO2: 97%  Weight: 252 lb 12.8 oz (114.7 kg)    Body mass index is 38.44 kg/m.   Physical Exam Vitals reviewed.  Constitutional:      Appearance: Normal appearance. He is obese.  HENT:     Head: Normocephalic and atraumatic.  Eyes:     Conjunctiva/sclera: Conjunctivae normal.  Cardiovascular:     Rate and Rhythm: Normal rate and regular rhythm.  Pulmonary:     Effort: Pulmonary effort is normal.     Breath sounds: Normal breath sounds.  Skin:    General: Skin is warm and dry.  Neurological:     General: No focal deficit present.     Mental  Status: He is alert and oriented to person, place, and time.  Psychiatric:        Mood and Affect: Mood normal.        Behavior: Behavior normal.        Thought Content: Thought content normal.        Judgment: Judgment normal.      Impression and Plan:  Type 2 diabetes mellitus with other specified complication, without long-term current use of insulin  (HCC) -     POCT glycosylated hemoglobin (Hb A1C)  Elevated PSA -     Ambulatory referral to Urology  CKD (chronic kidney disease) stage 4, GFR 15-29 ml/min (HCC)  OSA on CPAP  History of renal cell carcinoma  Needs flu shot -     Flu vaccine HIGH DOSE PF(Fluzone Trivalent)   - Diabetes is well-controlled with an A1c of 6.1. - Blood pressure is well-controlled. - LDL is at goal at 56. - Flu vaccine administered today. - Placed referral again to urology in regards to his elevated PSA. -He has stage 3-4 CKD with a known GFR <30 and Cr in the upper 2 range.  Time spent:31 minutes reviewing chart, interviewing and examining patient and formulating plan of care.     Tully Theophilus Andrews, MD [facility]

## 2023-09-08 NOTE — Progress Notes (Signed)
 [facility] OFFICE PROGRESS NOTE  Bryce Cain, Tully GRADE, MD [address] [place] [address]  DIAGNOSIS: Kidney cancer diagnosed in June 2023.  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   PRIOR THERAPY: 1) Robotic assisted laparoscopic right radical nephrectomy on June 23, 2021.  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    2) Pembrolizumab  200 mg every 3 weeks started on August 19, 2021.  Status post 17 cycles.  Last cycle was given on August 04, 2022.   CURRENT THERAPY: Observation   INTERVAL HISTORY: Bryce Cain 73 y.o. male returns to the clinic today for a follow-up visit. The patient is currently on observation and feeling well. He is being seen for surveillance of his history of renal cell carcinoma. He underwent surgical resection followed by 1 year of immunotherapy with Keytruda . He is on observation. He was last seen 6 months ago.   He follows with Dr. Carolynn from urology but has not seen them recently. His PCP drew his PSA a few months ago and it was elevated and they referred him back to urology. He saw nephrology in the past but is unsure when he was last seen and if he has any incoming appointments.   He saw his PCP recently for HTN, HLD, DM, OSA, and cardiomyopathy.   Denies any fever, chills, night sweats, unexplained weight loss.  He reports good appetite. Denies any chest pain, shortness of breath, cough, or hemoptysis.  Denies any new bone pain.  Denies any nausea, vomiting, diarrhea, or constipation.  Denies any dysuria, foul smelling urine, or blood in the urine.  He mentions his urine is bright yellow. He recently had a restaging CT scan. He is here for evaluation and to review his scan results.   MEDICAL HISTORY: Past Medical History:  Diagnosis Date   CHF (congestive heart failure) (HCC)    DM  (diabetes mellitus), type 2 (HCC)    Hypertension    OSA on CPAP    right renal ca     ALLERGIES:  has no known allergies.  MEDICATIONS:  Current Outpatient Medications  Medication Sig Dispense Refill   acetaminophen  (TYLENOL ) 325 MG tablet Take 1-2 tablets (325-650 mg total) by mouth every 4 (four) hours as needed for mild pain.     aspirin  EC 81 MG tablet Take 1 tablet (81 mg total) by mouth daily. Swallow whole. 90 tablet 3   isosorbide -hydrALAZINE  (BIDIL ) 20-37.5 MG tablet Take 1 tablet by mouth 3 (three) times daily. 90 tablet 3   levothyroxine  (SYNTHROID ) 50 MCG tablet TAKE 1 TABLET(50 MCG) BY MOUTH DAILY BEFORE BREAKFAST 90 tablet 1   nystatin  (MYCOSTATIN /NYSTOP ) powder Apply 1 Application topically 3 (three) times daily. 60 g 3   Omega-3 Fatty Acids (FISH OIL) 1000 MG CAPS Take 1,000 mg by mouth daily.     potassium chloride  (KLOR-CON  M) 10 MEQ tablet Take 1 tablet (10 mEq total) by mouth daily. 60 tablet 1   PREVIDENT 5000 ENAMEL PROTECT 1.1-5 % GEL Place 1 application. onto teeth at bedtime.     Probiotic Product (UP4 PROBIOTICS ADULT PO) Take 1 capsule by mouth daily.     rosuvastatin  (CRESTOR ) 40 MG tablet Take 1 tablet (40 mg total) by mouth daily. 90 tablet 1   sodium bicarbonate 650 MG tablet Take 650 mg by mouth 3 (three) times daily.  torsemide  (DEMADEX ) 20 MG tablet TAKE 1 TABLET BY MOUTH DAILY 90 tablet 3   No current facility-administered medications for this visit.    SURGICAL HISTORY:  Past Surgical History:  Procedure Laterality Date   BIV ICD INSERTION CRT-D N/A 12/03/2021   Procedure: BIV ICD INSERTION CRT-D;  Surgeon: Cindie Ole DASEN, MD;  Location: [facility] INVASIVE CV LAB;  Service: Cardiovascular;  Laterality: N/A;   RIGHT/LEFT HEART CATH AND CORONARY ANGIOGRAPHY N/A 02/16/2021   Procedure: RIGHT/LEFT HEART CATH AND CORONARY ANGIOGRAPHY;  Surgeon: Cherrie Toribio SAUNDERS, MD;  Location: [facility] INVASIVE CV LAB;  Service: Cardiovascular;  Laterality: N/A;   ROBOT  ASSISTED LAPAROSCOPIC NEPHRECTOMY Right 06/23/2021   Procedure: XI ROBOTIC ASSISTED LAPAROSCOPIC NEPHRECTOMY;  Surgeon: Devere Lonni Righter, MD;  Location: [facility] ORS;  Service: Urology;  Laterality: Right;    REVIEW OF SYSTEMS:   Review of Systems  Constitutional: Negative for appetite change, chills, fatigue, fever and unexpected weight change.  HENT:  Negative for mouth sores, nosebleeds, sore throat and trouble swallowing.   Eyes: Negative for eye problems and icterus.  Respiratory: Negative for cough, hemoptysis, shortness of breath and wheezing.   Cardiovascular: Negative for chest pain and leg swelling.  Gastrointestinal: Negative for abdominal pain, constipation, diarrhea, nausea and vomiting.  Genitourinary: Negative for bladder incontinence, difficulty urinating, dysuria, frequency and hematuria.   Musculoskeletal: Negative for back pain, gait problem, neck pain and neck stiffness.  Skin: Negative for itching and rash.  Neurological: Negative for dizziness, extremity weakness, gait problem, headaches, light-headedness and seizures.  Hematological: Negative for adenopathy. Does not bruise/bleed easily.  Psychiatric/Behavioral: Negative for confusion, depression and sleep disturbance. The patient is not nervous/anxious.     PHYSICAL EXAMINATION:  Blood pressure (!) 106/54, pulse 100, temperature 97.6 F (36.4 C), temperature source Temporal, resp. rate 16, weight 255 lb (115.7 kg), SpO2 91%.  ECOG PERFORMANCE STATUS: 0  Physical Exam  Constitutional: Oriented to person, place, and time and well-developed, well-nourished, and in no distress.   HENT:  Head: Normocephalic and atraumatic.  Mouth/Throat: Oropharynx is clear and moist. No oropharyngeal exudate.  Eyes: Conjunctivae are normal. Right eye exhibits no discharge. Left eye exhibits no discharge. No scleral icterus.  Neck: Normal range of motion. Neck supple.  Cardiovascular: Normal rate, regular rhythm, normal heart  sounds and intact distal pulses.   Pulmonary/Chest: Effort normal and breath sounds normal. No respiratory distress. No wheezes. No rales.  Abdominal: Soft. Bowel sounds are normal. Exhibits no distension and no mass. There is no tenderness.  Musculoskeletal: Normal range of motion. Exhibits no edema.  Lymphadenopathy:    No cervical adenopathy.  Neurological: Alert and oriented to person, place, and time. Exhibits normal muscle tone. Gait normal. Coordination normal.  Skin: Skin is warm and dry. No rash noted. Not diaphoretic. No erythema. No pallor.  Psychiatric: Mood, memory and judgment normal.  Vitals reviewed.  LABORATORY DATA: Lab Results  Component Value Date   WBC 5.8 08/23/2023   HGB 11.1 (L) 08/23/2023   HCT 31.7 (L) 08/23/2023   MCV 79.3 (L) 08/23/2023   PLT 144 (L) 08/23/2023      Chemistry      Component Value Date/Time   NA 138 08/23/2023 1514   NA 136 (A) 10/28/2022 0000   K 4.4 08/23/2023 1514   CL 105 08/23/2023 1514   CO2 27 08/23/2023 1514   BUN 43 (H) 08/23/2023 1514   BUN 60 (A) 10/28/2022 0000   CREATININE 2.84 (H) 08/23/2023 1514   GLU 94  10/28/2022 0000      Component Value Date/Time   CALCIUM  9.8 08/23/2023 1514   ALKPHOS 41 08/23/2023 1514   AST 26 08/23/2023 1514   ALT 15 08/23/2023 1514   BILITOT 0.5 08/23/2023 1514       RADIOGRAPHIC STUDIES:  CT CHEST ABDOMEN PELVIS WO CONTRAST Result Date: 09/05/2023 CLINICAL DATA:  Renal cell cancer staging. EXAM: CT CHEST, ABDOMEN AND PELVIS WITHOUT CONTRAST TECHNIQUE: Multidetector CT imaging of the chest, abdomen and pelvis was performed following the standard protocol without IV contrast. RADIATION DOSE REDUCTION: This exam was performed according to the departmental dose-optimization program which includes automated exposure control, adjustment of the mA and/or kV according to patient size and/or use of iterative reconstruction technique. COMPARISON:  02/23/2023. FINDINGS: CT CHEST FINDINGS  Cardiovascular: No significant vascular findings. Normal heart size. No pericardial effusion. Atheromatous calcifications noted of the aorta coronary arteries. Pacer wires are in place. Mediastinum/Nodes: No enlarged mediastinal, hilar, or axillary lymph nodes. Thyroid  gland, trachea, and esophagus demonstrate no significant findings. Lungs/Pleura: 5 mm pleural-based lung nodule in the right upper lobe has remained stable. No pleural effusion or pneumothorax. Musculoskeletal: No chest wall mass or suspicious bone lesions identified. Left-sided pacer noted. CT ABDOMEN PELVIS FINDINGS Hepatobiliary: No focal liver abnormality is seen. No gallstones, gallbladder wall thickening, or biliary dilatation. Pancreas: Unremarkable. No pancreatic ductal dilatation or surrounding inflammatory changes. Spleen: Normal in size without focal abnormality. Adrenals/Urinary Tract: Status post right nephrectomy. No adrenal lesion on the left. Left-sided renal cysts identified measuring 3.8 cm 4.2 cm and 1.5 cm. No nephrolithiasis or hydronephrosis. Stomach/Bowel: Stomach is within normal limits. Appendix appears normal. No evidence of bowel wall thickening, distention, or inflammatory changes. Vascular/Lymphatic: Aortic atherosclerosis. No enlarged abdominal or pelvic lymph nodes. Retroaortic left renal vein. Reproductive: Prostate is unremarkable. Other: Small anterior abdominal wall periumbilical defect containing omental fat. Dystrophic calcification upper abdominal mesentery which may be iatrogenic. No abdominopelvic ascites. Musculoskeletal: No acute or significant osseous findings. IMPRESSION: 1. Stable 5 mm right upper lobe lung nodule. 2. No evidence of metastatic disease in the chest, abdomen or pelvis. 3. Status post right nephrectomy. 4. Left-sided renal cysts. 5. Small anterior abdominal wall periumbilical defect containing omental fat. 6. Aortic atherosclerosis (ICD10-I70.0). Electronically Signed   By: Fonda Field  M.D.   On: 09/05/2023 09:47   CUP PACEART REMOTE DEVICE CHECK Result Date: 09/03/2023 ICD Scheduled remote reviewed. Normal device function.  Presenting rhythm: AS-BIV paced.  HF diagnostics have been abnormal in this monitoring period. Next remote 91 days. - CS, [facility]    ASSESSMENT/PLAN:  This is a very pleasant 73 year old African American male diagnosed with right sided renal cell carcinoma in June 2023.  He was found to have a T3a clear-cell without any evidence of metastatic disease.   He underwent robotic assisted laparoscopic right radical nephrectomy on 06/23/21   The patient underwent adjuvant treatment with immunotherapy with Keytruda  200 Mg IV every 3 weeks status post 17 cycles. Last cycle was given August 04, 2022   He is currently on observation and feeling well.   The patient was seen with Dr. Sherrod today.  Dr. Sherrod personally and independently reviewed the scan and discussed results with the patient today.  The scan showed no evidence of disease progression.  Dr. Sherrod recommends he continue on observation.   We will see him back for labs and follow up in 6 months and a restaging CT scan. I have ordered it without contrast due to CKD.  Encouraged him to follow up with urology to assess the prostate.   The patient was advised to call immediately if she has any concerning symptoms in the interval. The patient voices understanding of current disease status and treatment options and is in agreement with the current care plan. All questions were answered. The patient knows to call the clinic with any problems, questions or concerns. We can certainly see the patient much sooner if necessary  Orders Placed This Encounter  Procedures   CT CHEST ABDOMEN PELVIS WO CONTRAST    Standing Status:   Future    Expected Date:   03/12/2024    Expiration Date:   09/12/2024    Preferred imaging location?:   [facility]    If indicated for the ordered procedure, I authorize the  administration of oral contrast media per Radiology protocol:   No    Reason for no oral contrast::   CKD   CBC with Differential (Cancer Center Only)    Standing Status:   Future    Expected Date:   03/12/2024    Expiration Date:   09/12/2024   CMP (Cancer Center only)    Standing Status:   Future    Expected Date:   03/12/2024    Expiration Date:   09/12/2024     Damarkus Balis L Gemayel Mascio, PA-C 09/13/23  ADDENDUM: Hematology/Oncology Attending: I had a face-to-face encounter with the patient today.  I reviewed his record, lab, scan and recommended his care plan.  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  The patient has been on observation since that time.  He had repeat CT scan of the chest, abdomen and pelvis performed recently.  I personally and independently reviewed the scan and discussed the result with the patient today his scan showed no concerning findings for disease recurrence or metastasis. He had some prostate abnormality and he was referred back to urology for further evaluation and management. I recommended for the patient to continue on observation with repeat blood work and imaging studies in 6 months. He was advised to call immediately if he has any other concerning symptoms in the interval. The total time spent in the appointment was 30 minutes including review of chart and various tests results, discussions about plan of care and coordination of care plan . Disclaimer: This note was dictated with voice recognition software. Similar sounding words can inadvertently be transcribed and may be missed upon review. Sherrod MARLA Sherrod, MD

## 2023-09-12 NOTE — Progress Notes (Signed)
Remote ICD Transmission.

## 2023-09-13 ENCOUNTER — Inpatient Hospital Stay: Attending: Internal Medicine | Admitting: Physician Assistant

## 2023-09-13 VITALS — BP 106/54 | HR 100 | Temp 97.6°F | Resp 16 | Wt 255.0 lb

## 2023-09-13 DIAGNOSIS — I509 Heart failure, unspecified: Secondary | ICD-10-CM | POA: Diagnosis not present

## 2023-09-13 DIAGNOSIS — C641 Malignant neoplasm of right kidney, except renal pelvis: Secondary | ICD-10-CM

## 2023-09-13 DIAGNOSIS — Z85038 Personal history of other malignant neoplasm of large intestine: Secondary | ICD-10-CM | POA: Insufficient documentation

## 2023-09-13 DIAGNOSIS — I110 Hypertensive heart disease with heart failure: Secondary | ICD-10-CM | POA: Insufficient documentation

## 2023-09-13 DIAGNOSIS — Z79890 Hormone replacement therapy (postmenopausal): Secondary | ICD-10-CM | POA: Insufficient documentation

## 2023-09-13 DIAGNOSIS — Z79899 Other long term (current) drug therapy: Secondary | ICD-10-CM | POA: Insufficient documentation

## 2023-09-13 DIAGNOSIS — Z905 Acquired absence of kidney: Secondary | ICD-10-CM | POA: Insufficient documentation

## 2023-09-13 DIAGNOSIS — Z7982 Long term (current) use of aspirin: Secondary | ICD-10-CM | POA: Diagnosis not present

## 2023-09-13 DIAGNOSIS — Z85528 Personal history of other malignant neoplasm of kidney: Secondary | ICD-10-CM | POA: Diagnosis present

## 2023-09-13 DIAGNOSIS — E119 Type 2 diabetes mellitus without complications: Secondary | ICD-10-CM | POA: Diagnosis not present

## 2023-09-14 ENCOUNTER — Other Ambulatory Visit: Payer: Self-pay

## 2023-09-14 ENCOUNTER — Telehealth: Payer: Self-pay | Admitting: Internal Medicine

## 2023-09-14 NOTE — Telephone Encounter (Signed)
 Scheduled patient appointments, called and left voicemail for patient with details.

## 2023-09-15 MED ORDER — ISOSORB DINITRATE-HYDRALAZINE 20-37.5 MG PO TABS: 1.0000 | ORAL_TABLET | Freq: Three times a day (TID) | ORAL | 6 refills | Status: AC

## 2023-09-30 ENCOUNTER — Other Ambulatory Visit (HOSPITAL_COMMUNITY): Payer: Self-pay | Admitting: Family Medicine

## 2023-11-28 ENCOUNTER — Ambulatory Visit: Payer: Medicare PPO

## 2023-12-05 ENCOUNTER — Ambulatory Visit

## 2023-12-05 ENCOUNTER — Other Ambulatory Visit: Payer: Self-pay | Admitting: Physician Assistant

## 2023-12-05 DIAGNOSIS — I5022 Chronic systolic (congestive) heart failure: Secondary | ICD-10-CM | POA: Diagnosis not present

## 2023-12-06 LAB — CUP PACEART REMOTE DEVICE CHECK
Battery Remaining Longevity: 72 mo
Battery Remaining Percentage: 76 %
Battery Voltage: 2.99 V
Brady Statistic AP VP Percent: 4.9 %
Brady Statistic AP VS Percent: 1 %
Brady Statistic AS VP Percent: 94 %
Brady Statistic AS VS Percent: 1 %
Brady Statistic RA Percent Paced: 4.7 %
Date Time Interrogation Session: 20251201020244
HighPow Impedance: 66 Ohm
Implantable Lead Connection Status: 753985
Implantable Lead Connection Status: 753985
Implantable Lead Connection Status: 753985
Implantable Lead Implant Date: 20231130
Implantable Lead Implant Date: 20231130
Implantable Lead Implant Date: 20231130
Implantable Lead Location: 753858
Implantable Lead Location: 753859
Implantable Lead Location: 753860
Implantable Pulse Generator Implant Date: 20231130
Lead Channel Impedance Value: 410 Ohm
Lead Channel Impedance Value: 540 Ohm
Lead Channel Impedance Value: 760 Ohm
Lead Channel Pacing Threshold Amplitude: 0.375 V
Lead Channel Pacing Threshold Amplitude: 0.625 V
Lead Channel Pacing Threshold Amplitude: 1.625 V
Lead Channel Pacing Threshold Pulse Width: 0.5 ms
Lead Channel Pacing Threshold Pulse Width: 0.5 ms
Lead Channel Pacing Threshold Pulse Width: 0.8 ms
Lead Channel Sensing Intrinsic Amplitude: 12 mV
Lead Channel Sensing Intrinsic Amplitude: 5 mV
Lead Channel Setting Pacing Amplitude: 1.375
Lead Channel Setting Pacing Amplitude: 1.625
Lead Channel Setting Pacing Amplitude: 2.125
Lead Channel Setting Pacing Pulse Width: 0.5 ms
Lead Channel Setting Pacing Pulse Width: 0.8 ms
Lead Channel Setting Sensing Sensitivity: 0.5 mV
Pulse Gen Serial Number: 211010483
Zone Setting Status: 755011

## 2023-12-07 ENCOUNTER — Ambulatory Visit: Admitting: Internal Medicine

## 2023-12-07 ENCOUNTER — Encounter: Payer: Self-pay | Admitting: Internal Medicine

## 2023-12-07 VITALS — BP 110/68 | HR 74 | Temp 97.8°F | Wt 255.7 lb

## 2023-12-07 DIAGNOSIS — R972 Elevated prostate specific antigen [PSA]: Secondary | ICD-10-CM | POA: Diagnosis not present

## 2023-12-07 DIAGNOSIS — Z85528 Personal history of other malignant neoplasm of kidney: Secondary | ICD-10-CM | POA: Diagnosis not present

## 2023-12-07 DIAGNOSIS — E1169 Type 2 diabetes mellitus with other specified complication: Secondary | ICD-10-CM | POA: Diagnosis not present

## 2023-12-07 DIAGNOSIS — N184 Chronic kidney disease, stage 4 (severe): Secondary | ICD-10-CM | POA: Diagnosis not present

## 2023-12-07 DIAGNOSIS — E119 Type 2 diabetes mellitus without complications: Secondary | ICD-10-CM | POA: Insufficient documentation

## 2023-12-07 DIAGNOSIS — E559 Vitamin D deficiency, unspecified: Secondary | ICD-10-CM

## 2023-12-07 DIAGNOSIS — I5022 Chronic systolic (congestive) heart failure: Secondary | ICD-10-CM

## 2023-12-07 DIAGNOSIS — C641 Malignant neoplasm of right kidney, except renal pelvis: Secondary | ICD-10-CM

## 2023-12-07 DIAGNOSIS — G4733 Obstructive sleep apnea (adult) (pediatric): Secondary | ICD-10-CM

## 2023-12-07 LAB — POCT GLYCOSYLATED HEMOGLOBIN (HGB A1C): Hemoglobin A1C: 6.3 % — AB (ref 4.0–5.6)

## 2023-12-07 LAB — VITAMIN D 25 HYDROXY (VIT D DEFICIENCY, FRACTURES): VITD: 63.62 ng/mL (ref 30.00–100.00)

## 2023-12-07 MED ORDER — PREVIDENT 5000 ENAMEL PROTECT 1.1-5 % DT GEL: 1.0000 | Freq: Every day | DENTAL | 3 refills | Status: AC

## 2023-12-07 NOTE — Assessment & Plan Note (Signed)
Check levels next visit.

## 2023-12-07 NOTE — Progress Notes (Signed)
 Established Patient Office Visit     CC/Reason for Visit: Follow-up chronic conditions  HPI: Bryce Cain is a 73 y.o. male who is coming in today for the above mentioned reasons. Past Medical History is significant for: Hypertension, hyperlipidemia, type 2 diabetes, CKD, nonischemic cardiomyopathy obstructive sleep apnea, vitamin D  deficiency,, history of renal cell carcinoma.  Feeling well without concerns or complaints.  Has not yet followed up with urology in regards to his elevated PSA of 8.4.   Past Medical/Surgical History: Past Medical History:  Diagnosis Date   CHF (congestive heart failure) (HCC)    DM (diabetes mellitus), type 2 (HCC)    Hypertension    OSA on CPAP    right renal ca     Past Surgical History:  Procedure Laterality Date   BIV ICD INSERTION CRT-D N/A 12/03/2021   Procedure: BIV ICD INSERTION CRT-D;  Surgeon: Cindie Ole DASEN, MD;  Location: [facility] IN[facility];  Service: Cardiovascular;  Laterality: N/A;   RIGHT/LEFT HEART CATH AND CORONARY ANGIOGRAPHY N/A 02/16/2021   Procedure: RIGHT/LEFT HEART CATH AND CORONARY ANGIOGRAPHY;  Surgeon: Cherrie Toribio SAUNDERS, MD;  Location: [facility] INVASIVE CV [facility];  Service: Cardiovascular;  Laterality: N/A;   ROBOT ASSISTED LAPAROSCOPIC NEPHRECTOMY Right 06/23/2021   Procedure: XI ROBOTIC ASSISTED LAPAROSCOPIC NEPHRECTOMY;  Surgeon: Devere Lonni Righter, MD;  Location: [facility];  Service: Urology;  Laterality: Right;    Social History:  reports that he quit smoking about 32 years ago. His smoking use included pipe. He does not have any smokeless tobacco history on file. No history on file for alcohol use and drug use.  Allergies: No Known Allergies  Family History:  Family History  Problem Relation Age of Onset   Heart attack Father      Current Outpatient Medications:    acetaminophen  (TYLENOL ) 325 MG tablet, Take 1-2 tablets (325-650 mg total) by mouth every 4 (four) hours as needed for mild pain., Disp: ,  Rfl:    aspirin  EC 81 MG tablet, Take 1 tablet (81 mg total) by mouth daily. Swallow whole., Disp: 90 tablet, Rfl: 3   isosorbide -hydrALAZINE  (BIDIL ) 20-37.5 MG tablet, Take 1 tablet by mouth 3 (three) times daily., Disp: 90 tablet, Rfl: 6   levothyroxine  (SYNTHROID ) 50 MCG tablet, TAKE 1 TABLET(50 MCG) BY MOUTH DAILY BEFORE BREAKFAST, Disp: 90 tablet, Rfl: 1   nystatin  (MYCOSTATIN /NYSTOP ) powder, Apply 1 Application topically 3 (three) times daily., Disp: 60 g, Rfl: 3   Omega-3 Fatty Acids (FISH OIL) 1000 MG CAPS, Take 1,000 mg by mouth daily., Disp: , Rfl:    potassium chloride  (KLOR-CON  M) 10 MEQ tablet, TAKE 1 TABLET(10 MEQ) BY MOUTH DAILY, Disp: 90 tablet, Rfl: 1   Probiotic Product (UP4 PROBIOTICS ADULT PO), Take 1 capsule by mouth daily., Disp: , Rfl:    rosuvastatin  (CRESTOR ) 40 MG tablet, Take 1 tablet (40 mg total) by mouth daily., Disp: 90 tablet, Rfl: 1   sodium bicarbonate 650 MG tablet, Take 650 mg by mouth 3 (three) times daily., Disp: , Rfl:    PREVIDENT 5000 ENAMEL PROTECT 1.1-5 % GEL, Place 1 application  onto teeth at bedtime., Disp: 100 mL, Rfl: 3   torsemide  (DEMADEX ) 20 MG tablet, TAKE 1 TABLET BY MOUTH DAILY (Patient not taking: Reported on 12/07/2023), Disp: 90 tablet, Rfl: 3  Review of Systems:  Negative unless indicated in HPI.   Physical Exam: Vitals:   12/07/23 1349  BP: 110/68  Pulse: 74  Temp: 97.8 F (36.6  C)  TempSrc: Oral  SpO2: 98%  Weight: 255 lb 11.2 oz (116 kg)    Body mass index is 38.88 kg/m.   Physical Exam Vitals reviewed.  Constitutional:      Appearance: Normal appearance. He is obese.  HENT:     Head: Normocephalic and atraumatic.  Eyes:     Conjunctiva/sclera: Conjunctivae normal.  Cardiovascular:     Rate and Rhythm: Normal rate and regular rhythm.  Pulmonary:     Effort: Pulmonary effort is normal.     Breath sounds: Normal breath sounds.  Skin:    General: Skin is warm and dry.  Neurological:     General: No focal deficit  present.     Mental Status: He is alert and oriented to person, place, and time.  Psychiatric:        Mood and Affect: Mood normal.        Behavior: Behavior normal.        Thought Content: Thought content normal.        Judgment: Judgment normal.      Impression and Plan:  Type 2 diabetes mellitus with other specified complication, without long-term current use of insulin  (HCC) Assessment & Plan: Well-controlled with an A1c of 6.3.  Orders: -     POCT glycosylated hemoglobin (Hb A1C)  Chronic systolic heart failure (HCC) Assessment & Plan: Stable, no chest pain, no shortness of breath, no signs of volume overload.   Renal cell carcinoma of right kidney (HCC)  Elevated PSA Assessment & Plan: Has not yet seen urology, will follow-up on referral.   CKD (chronic kidney disease) stage 4, GFR 15-29 ml/min (HCC) Assessment & Plan: Creatinine stable at around 2.8, followed by nephrology.   Vitamin D  deficiency Assessment & Plan: Check levels next visit.  Orders: -     VITAMIN D  25 Hydroxy (Vit-D Deficiency, Fractures); Future  OSA on CPAP Assessment & Plan: Noted.   Other orders -     PreviDent 5000 Enamel Protect; Place 1 application  onto teeth at bedtime.  Dispense: 100 mL; Refill: 3     Time spent:32 minutes reviewing chart, interviewing and examining patient and formulating plan of care.     Tully Theophilus Andrews, MD [facility]

## 2023-12-07 NOTE — Assessment & Plan Note (Signed)
 Noted

## 2023-12-07 NOTE — Assessment & Plan Note (Signed)
Well-controlled with an A1c of 6.3.

## 2023-12-07 NOTE — Assessment & Plan Note (Signed)
 Creatinine stable at around 2.8, followed by nephrology.

## 2023-12-07 NOTE — Assessment & Plan Note (Signed)
 Stable, no chest pain, no shortness of breath, no signs of volume overload.

## 2023-12-07 NOTE — Assessment & Plan Note (Signed)
 Has not yet seen urology, will follow-up on referral.

## 2023-12-08 ENCOUNTER — Ambulatory Visit: Payer: Self-pay | Admitting: Cardiovascular Disease

## 2023-12-08 ENCOUNTER — Ambulatory Visit: Payer: Self-pay | Admitting: Internal Medicine

## 2023-12-08 NOTE — Progress Notes (Signed)
 Remote ICD Transmission

## 2023-12-09 NOTE — Telephone Encounter (Signed)
 Pt has not been realeased by Dr. Bensimhon

## 2023-12-12 MED ORDER — ROSUVASTATIN CALCIUM 40 MG PO TABS: 40.0000 mg | ORAL_TABLET | Freq: Every day | ORAL | 1 refills | Status: AC

## 2024-01-09 ENCOUNTER — Other Ambulatory Visit: Payer: Self-pay | Admitting: Physician Assistant

## 2024-01-09 DIAGNOSIS — E039 Hypothyroidism, unspecified: Secondary | ICD-10-CM

## 2024-03-05 ENCOUNTER — Ambulatory Visit

## 2024-03-12 ENCOUNTER — Other Ambulatory Visit

## 2024-03-15 ENCOUNTER — Ambulatory Visit: Admitting: Internal Medicine

## 2024-06-04 ENCOUNTER — Ambulatory Visit

## 2024-09-03 ENCOUNTER — Ambulatory Visit

## 2024-12-03 ENCOUNTER — Ambulatory Visit
